# Patient Record
Sex: Male | Born: 1960 | Race: White | Hispanic: No | Marital: Single | Smoking: Former smoker
Health system: Southern US, Community
[De-identification: ages and names within clinical notes are randomized; demographics above are authoritative.]

## PROBLEM LIST (undated history)

## (undated) DIAGNOSIS — F250 Schizoaffective disorder, bipolar type: Secondary | ICD-10-CM

## (undated) DIAGNOSIS — J439 Emphysema, unspecified: Secondary | ICD-10-CM

## (undated) DIAGNOSIS — F1910 Other psychoactive substance abuse, uncomplicated: Secondary | ICD-10-CM

## (undated) DIAGNOSIS — F419 Anxiety disorder, unspecified: Secondary | ICD-10-CM

## (undated) DIAGNOSIS — M1990 Unspecified osteoarthritis, unspecified site: Secondary | ICD-10-CM

## (undated) DIAGNOSIS — E785 Hyperlipidemia, unspecified: Secondary | ICD-10-CM

## (undated) DIAGNOSIS — I10 Essential (primary) hypertension: Secondary | ICD-10-CM

## (undated) HISTORY — DX: Emphysema, unspecified: J43.9

## (undated) HISTORY — DX: Unspecified osteoarthritis, unspecified site: M19.90

## (undated) HISTORY — DX: Other psychoactive substance abuse, uncomplicated: F19.10

## (undated) HISTORY — PX: FINGER SURGERY: SHX640

## (undated) HISTORY — DX: Anxiety disorder, unspecified: F41.9

## (undated) MED FILL — Fosaprepitant Dimeglumine For IV Infusion 150 MG (Base Eq): INTRAVENOUS | Qty: 5 | Status: AC

---

## 2006-04-24 ENCOUNTER — Emergency Department: Payer: Self-pay | Admitting: Emergency Medicine

## 2008-04-20 ENCOUNTER — Emergency Department: Payer: Self-pay | Admitting: Emergency Medicine

## 2008-12-12 ENCOUNTER — Ambulatory Visit: Payer: Self-pay | Admitting: Family Medicine

## 2009-11-06 ENCOUNTER — Ambulatory Visit: Payer: Self-pay | Admitting: Unknown Physician Specialty

## 2010-09-19 ENCOUNTER — Ambulatory Visit: Payer: Self-pay | Admitting: Internal Medicine

## 2010-09-26 ENCOUNTER — Ambulatory Visit: Payer: Self-pay | Admitting: Internal Medicine

## 2012-03-05 IMAGING — CT CT HEAD WITHOUT CONTRAST
1 of 2 series · 13 of 30 positions shown, 17 images · non-contrast
Comparison: none

REASON FOR EXAM: mental status deterioration
COMMENTS:

[Series 5: soft tissue recon · axial · 0.42mm/px · z∈[-121,-2]mm · 13 of 30 slices shown, 17 images]
[im 3/30  brain]
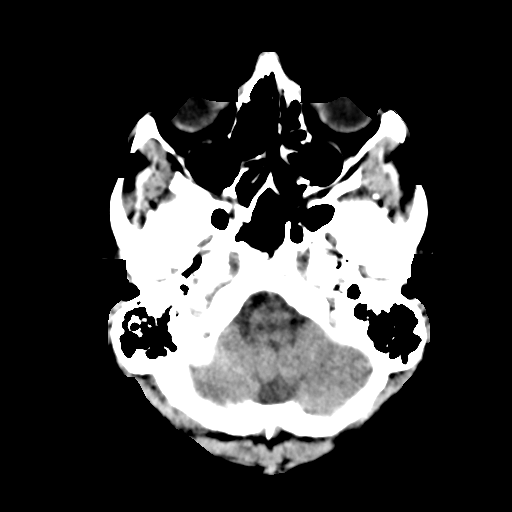
[im 3/30  bone]
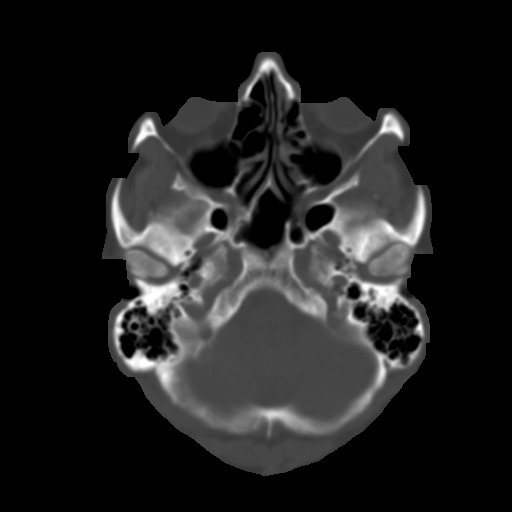
[im 5/30  brain]
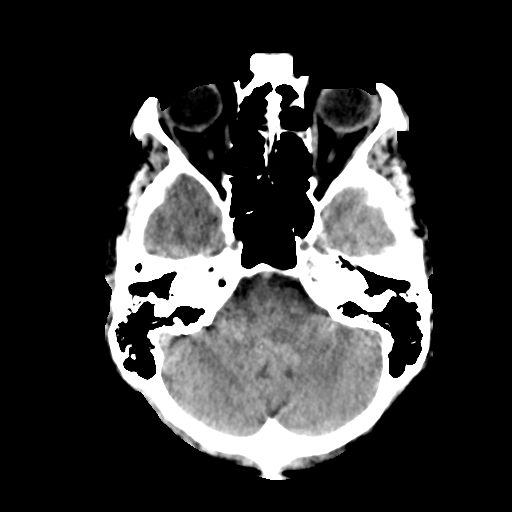
[im 7/30  brain]
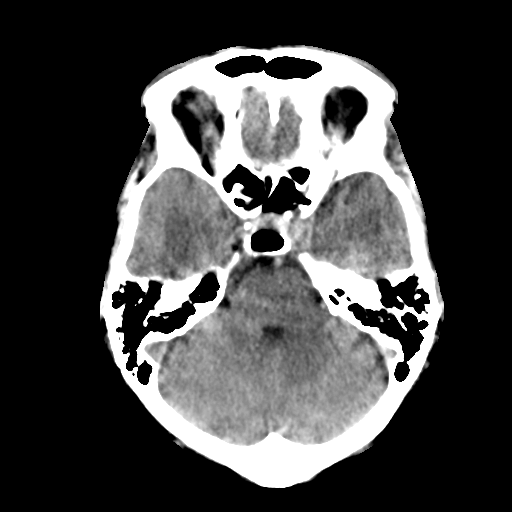
[im 9/30  brain]
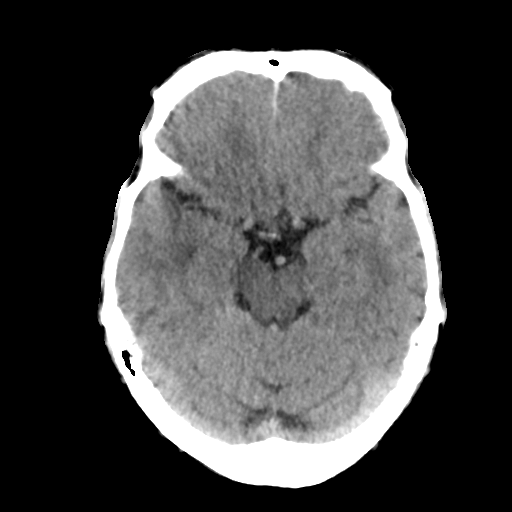
[im 11/30  brain]
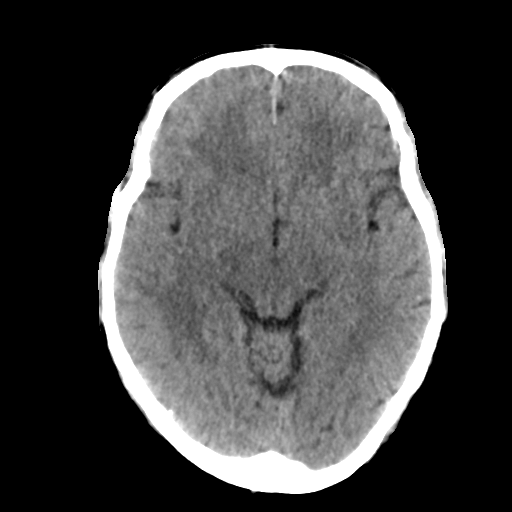
[im 11/30  bone]
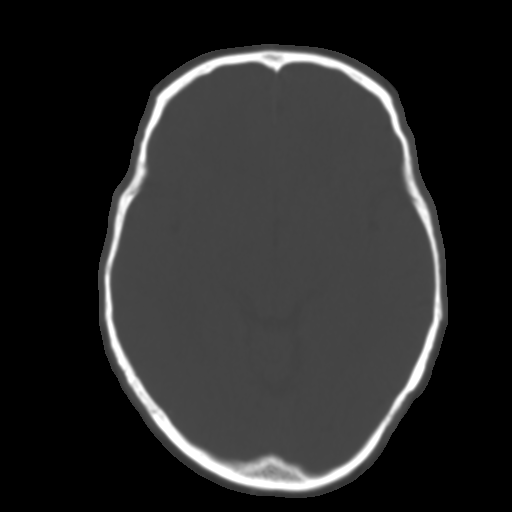
[im 13/30  brain]
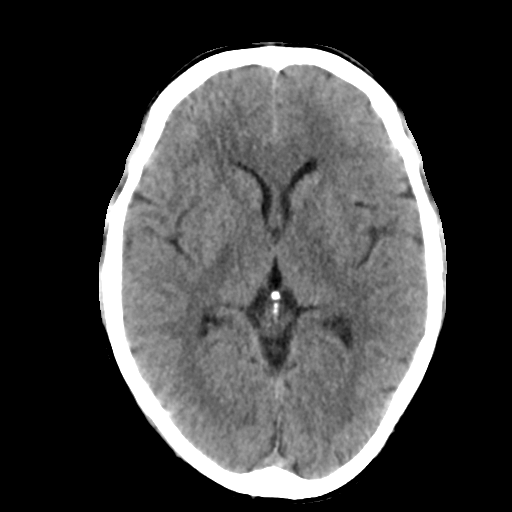
[im 15/30  brain]
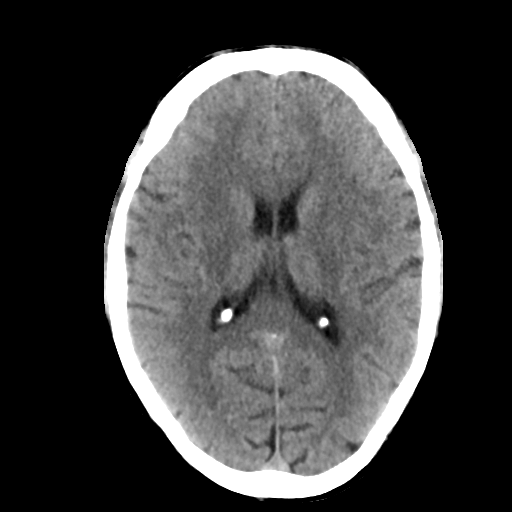
[im 17/30  brain]
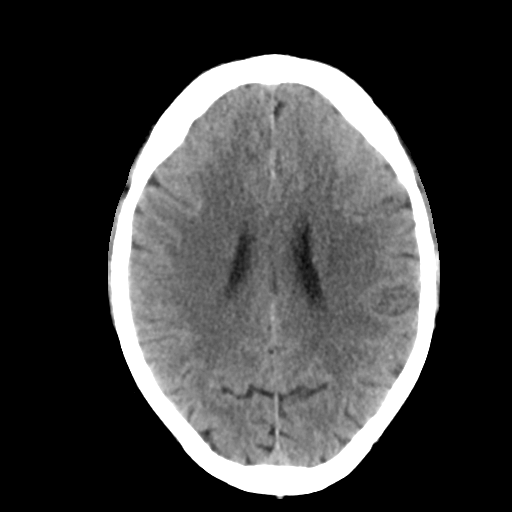
[im 19/30  brain]
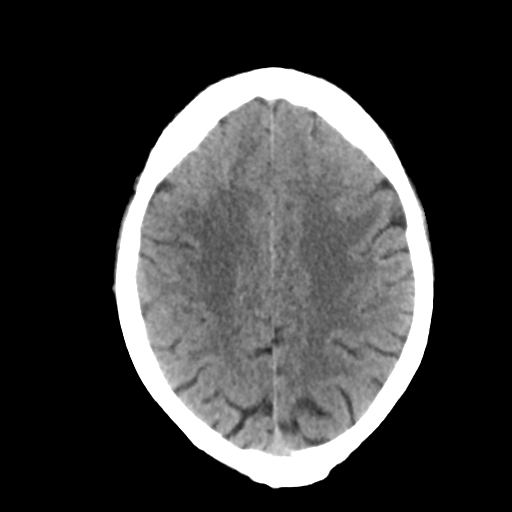
[im 19/30  bone]
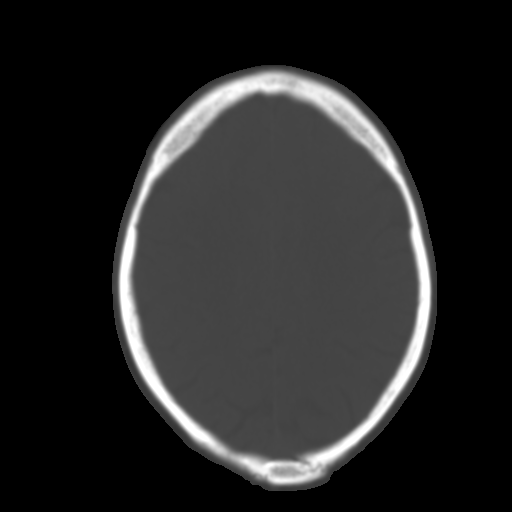
[im 21/30  brain]
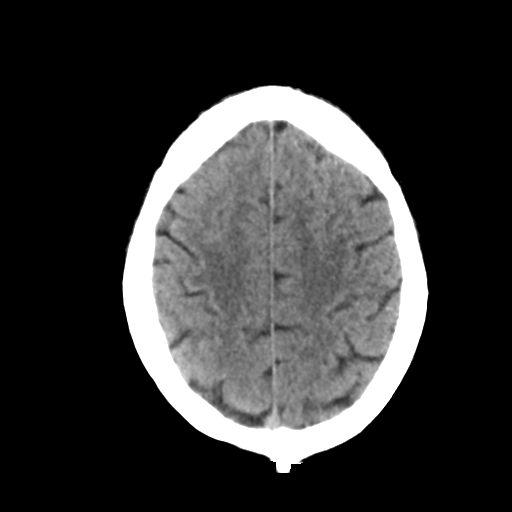
[im 23/30  brain]
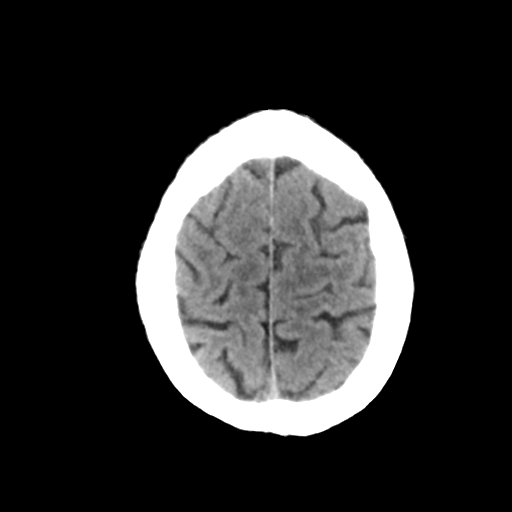
[im 25/30  brain]
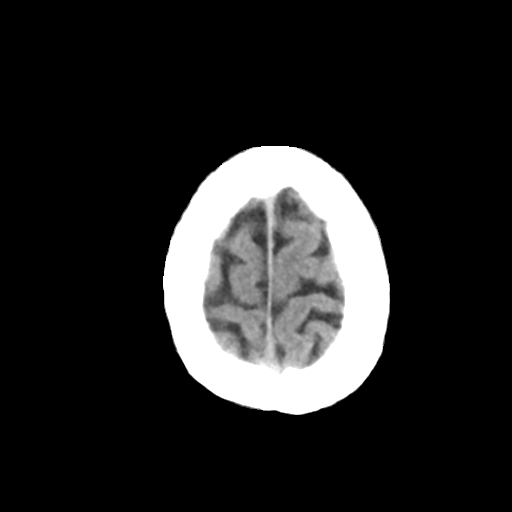
[im 27/30  brain]
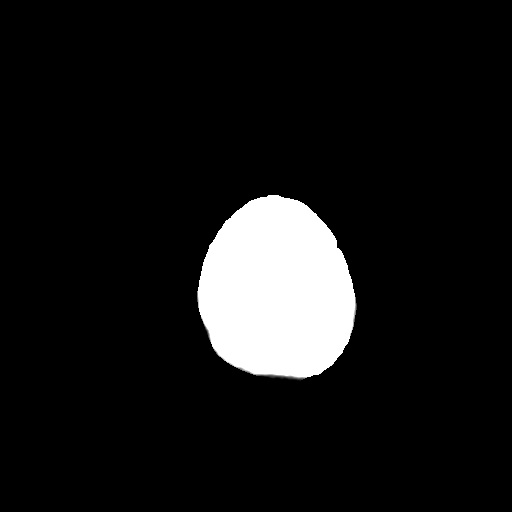
[im 27/30  bone]
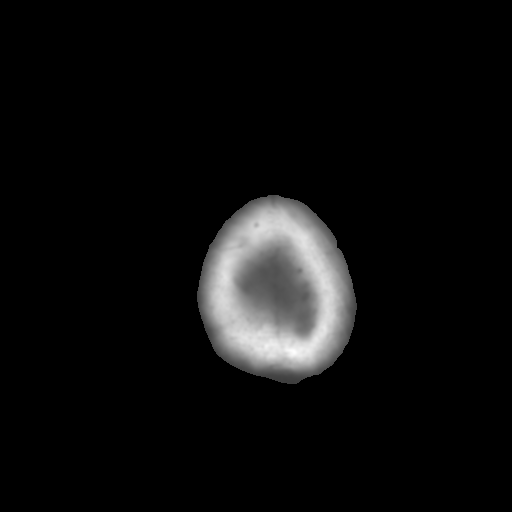

[13 of 30 positions shown; findings below may reference images not displayed]

PROCEDURE:     KCT - KCT HEAD WITHOUT CONTRAST  - September 26, 2010 [DATE]

RESULT:     Noncontrast CT of the brain is performed in the standard
fashion. The patient has no previous exam for comparison.

The ventricles and sulci appear to be within normal limits for the patient's
age. There is no evidence of intracranial hemorrhage, mass effect or midline
shift. No territorial infarct is present. The included paranasal sinuses and
mastoids demonstrate mild mucosal thickening in the ethmoid regions. The
calvarium is intact.
IMPRESSION: No focal or acute intracranial abnormality evident.

## 2014-10-03 DIAGNOSIS — Z125 Encounter for screening for malignant neoplasm of prostate: Secondary | ICD-10-CM | POA: Diagnosis not present

## 2014-10-03 DIAGNOSIS — Z79899 Other long term (current) drug therapy: Secondary | ICD-10-CM | POA: Diagnosis not present

## 2014-10-10 DIAGNOSIS — F205 Residual schizophrenia: Secondary | ICD-10-CM | POA: Diagnosis not present

## 2014-10-10 DIAGNOSIS — I10 Essential (primary) hypertension: Secondary | ICD-10-CM | POA: Diagnosis not present

## 2014-10-10 DIAGNOSIS — E780 Pure hypercholesterolemia: Secondary | ICD-10-CM | POA: Diagnosis not present

## 2014-10-10 DIAGNOSIS — Z720 Tobacco use: Secondary | ICD-10-CM | POA: Diagnosis not present

## 2014-10-17 ENCOUNTER — Emergency Department: Payer: Self-pay | Admitting: Emergency Medicine

## 2014-10-17 DIAGNOSIS — Z791 Long term (current) use of non-steroidal anti-inflammatories (NSAID): Secondary | ICD-10-CM | POA: Diagnosis not present

## 2014-10-17 DIAGNOSIS — Z720 Tobacco use: Secondary | ICD-10-CM | POA: Diagnosis not present

## 2014-10-17 DIAGNOSIS — R079 Chest pain, unspecified: Secondary | ICD-10-CM | POA: Diagnosis not present

## 2014-10-17 DIAGNOSIS — Z79899 Other long term (current) drug therapy: Secondary | ICD-10-CM | POA: Diagnosis not present

## 2014-10-17 DIAGNOSIS — S299XXA Unspecified injury of thorax, initial encounter: Secondary | ICD-10-CM | POA: Diagnosis not present

## 2014-10-17 DIAGNOSIS — S2020XA Contusion of thorax, unspecified, initial encounter: Secondary | ICD-10-CM | POA: Diagnosis not present

## 2014-10-17 DIAGNOSIS — S20219A Contusion of unspecified front wall of thorax, initial encounter: Secondary | ICD-10-CM | POA: Diagnosis not present

## 2014-12-01 DIAGNOSIS — F251 Schizoaffective disorder, depressive type: Secondary | ICD-10-CM | POA: Diagnosis not present

## 2014-12-01 DIAGNOSIS — F209 Schizophrenia, unspecified: Secondary | ICD-10-CM | POA: Diagnosis not present

## 2014-12-19 DIAGNOSIS — F251 Schizoaffective disorder, depressive type: Secondary | ICD-10-CM | POA: Diagnosis not present

## 2014-12-19 DIAGNOSIS — F209 Schizophrenia, unspecified: Secondary | ICD-10-CM | POA: Diagnosis not present

## 2015-02-01 DIAGNOSIS — F205 Residual schizophrenia: Secondary | ICD-10-CM | POA: Diagnosis not present

## 2015-02-01 DIAGNOSIS — E780 Pure hypercholesterolemia: Secondary | ICD-10-CM | POA: Diagnosis not present

## 2015-02-01 DIAGNOSIS — I10 Essential (primary) hypertension: Secondary | ICD-10-CM | POA: Diagnosis not present

## 2015-02-01 DIAGNOSIS — Z720 Tobacco use: Secondary | ICD-10-CM | POA: Diagnosis not present

## 2015-02-08 DIAGNOSIS — I10 Essential (primary) hypertension: Secondary | ICD-10-CM | POA: Diagnosis not present

## 2015-02-08 DIAGNOSIS — Z720 Tobacco use: Secondary | ICD-10-CM | POA: Diagnosis not present

## 2015-02-08 DIAGNOSIS — F205 Residual schizophrenia: Secondary | ICD-10-CM | POA: Diagnosis not present

## 2015-02-08 DIAGNOSIS — E780 Pure hypercholesterolemia: Secondary | ICD-10-CM | POA: Diagnosis not present

## 2015-03-13 DIAGNOSIS — F209 Schizophrenia, unspecified: Secondary | ICD-10-CM | POA: Diagnosis not present

## 2015-03-13 DIAGNOSIS — F251 Schizoaffective disorder, depressive type: Secondary | ICD-10-CM | POA: Diagnosis not present

## 2015-06-12 DIAGNOSIS — F251 Schizoaffective disorder, depressive type: Secondary | ICD-10-CM | POA: Diagnosis not present

## 2015-06-12 DIAGNOSIS — F209 Schizophrenia, unspecified: Secondary | ICD-10-CM | POA: Diagnosis not present

## 2015-06-30 DIAGNOSIS — Z720 Tobacco use: Secondary | ICD-10-CM | POA: Diagnosis not present

## 2015-06-30 DIAGNOSIS — E7800 Pure hypercholesterolemia, unspecified: Secondary | ICD-10-CM | POA: Diagnosis not present

## 2015-06-30 DIAGNOSIS — F205 Residual schizophrenia: Secondary | ICD-10-CM | POA: Diagnosis not present

## 2015-06-30 DIAGNOSIS — R05 Cough: Secondary | ICD-10-CM | POA: Diagnosis not present

## 2015-06-30 DIAGNOSIS — I10 Essential (primary) hypertension: Secondary | ICD-10-CM | POA: Diagnosis not present

## 2015-07-07 DIAGNOSIS — E871 Hypo-osmolality and hyponatremia: Secondary | ICD-10-CM | POA: Diagnosis not present

## 2015-07-07 DIAGNOSIS — F2089 Other schizophrenia: Secondary | ICD-10-CM | POA: Diagnosis not present

## 2015-07-07 DIAGNOSIS — E7800 Pure hypercholesterolemia, unspecified: Secondary | ICD-10-CM | POA: Diagnosis not present

## 2015-07-07 DIAGNOSIS — I10 Essential (primary) hypertension: Secondary | ICD-10-CM | POA: Diagnosis not present

## 2015-07-07 DIAGNOSIS — Z23 Encounter for immunization: Secondary | ICD-10-CM | POA: Diagnosis not present

## 2015-07-07 DIAGNOSIS — Z720 Tobacco use: Secondary | ICD-10-CM | POA: Diagnosis not present

## 2015-09-07 DIAGNOSIS — F209 Schizophrenia, unspecified: Secondary | ICD-10-CM | POA: Diagnosis not present

## 2015-09-07 DIAGNOSIS — F251 Schizoaffective disorder, depressive type: Secondary | ICD-10-CM | POA: Diagnosis not present

## 2015-10-31 DIAGNOSIS — I10 Essential (primary) hypertension: Secondary | ICD-10-CM | POA: Diagnosis not present

## 2015-10-31 DIAGNOSIS — F2089 Other schizophrenia: Secondary | ICD-10-CM | POA: Diagnosis not present

## 2015-10-31 DIAGNOSIS — E7800 Pure hypercholesterolemia, unspecified: Secondary | ICD-10-CM | POA: Diagnosis not present

## 2015-10-31 DIAGNOSIS — E871 Hypo-osmolality and hyponatremia: Secondary | ICD-10-CM | POA: Diagnosis not present

## 2015-10-31 DIAGNOSIS — Z720 Tobacco use: Secondary | ICD-10-CM | POA: Diagnosis not present

## 2015-10-31 DIAGNOSIS — Z23 Encounter for immunization: Secondary | ICD-10-CM | POA: Diagnosis not present

## 2015-11-07 DIAGNOSIS — Z23 Encounter for immunization: Secondary | ICD-10-CM | POA: Diagnosis not present

## 2015-11-07 DIAGNOSIS — E871 Hypo-osmolality and hyponatremia: Secondary | ICD-10-CM | POA: Diagnosis not present

## 2015-11-07 DIAGNOSIS — E7800 Pure hypercholesterolemia, unspecified: Secondary | ICD-10-CM | POA: Diagnosis not present

## 2015-11-07 DIAGNOSIS — I10 Essential (primary) hypertension: Secondary | ICD-10-CM | POA: Diagnosis not present

## 2015-11-07 DIAGNOSIS — F2089 Other schizophrenia: Secondary | ICD-10-CM | POA: Diagnosis not present

## 2015-11-07 DIAGNOSIS — Z720 Tobacco use: Secondary | ICD-10-CM | POA: Diagnosis not present

## 2015-11-30 DIAGNOSIS — F209 Schizophrenia, unspecified: Secondary | ICD-10-CM | POA: Diagnosis not present

## 2015-11-30 DIAGNOSIS — F251 Schizoaffective disorder, depressive type: Secondary | ICD-10-CM | POA: Diagnosis not present

## 2016-02-20 DIAGNOSIS — F251 Schizoaffective disorder, depressive type: Secondary | ICD-10-CM | POA: Diagnosis not present

## 2016-02-20 DIAGNOSIS — F209 Schizophrenia, unspecified: Secondary | ICD-10-CM | POA: Diagnosis not present

## 2016-02-29 DIAGNOSIS — E7800 Pure hypercholesterolemia, unspecified: Secondary | ICD-10-CM | POA: Diagnosis not present

## 2016-02-29 DIAGNOSIS — F2089 Other schizophrenia: Secondary | ICD-10-CM | POA: Diagnosis not present

## 2016-02-29 DIAGNOSIS — E871 Hypo-osmolality and hyponatremia: Secondary | ICD-10-CM | POA: Diagnosis not present

## 2016-02-29 DIAGNOSIS — Z720 Tobacco use: Secondary | ICD-10-CM | POA: Diagnosis not present

## 2016-02-29 DIAGNOSIS — I10 Essential (primary) hypertension: Secondary | ICD-10-CM | POA: Diagnosis not present

## 2016-03-07 DIAGNOSIS — Z0000 Encounter for general adult medical examination without abnormal findings: Secondary | ICD-10-CM | POA: Diagnosis not present

## 2016-03-07 DIAGNOSIS — Z720 Tobacco use: Secondary | ICD-10-CM | POA: Diagnosis not present

## 2016-03-07 DIAGNOSIS — E871 Hypo-osmolality and hyponatremia: Secondary | ICD-10-CM | POA: Diagnosis not present

## 2016-03-07 DIAGNOSIS — F2089 Other schizophrenia: Secondary | ICD-10-CM | POA: Diagnosis not present

## 2016-03-07 DIAGNOSIS — E7800 Pure hypercholesterolemia, unspecified: Secondary | ICD-10-CM | POA: Diagnosis not present

## 2016-03-07 DIAGNOSIS — I10 Essential (primary) hypertension: Secondary | ICD-10-CM | POA: Diagnosis not present

## 2016-03-26 IMAGING — CR DG RIBS 2V*R*
1 series · 4 of 4 positions shown · non-contrast
Comparison: 10/17/2014

CLINICAL DATA: 53-year-old male with a history of right-sided chest
pain posterior after a slip and a fall.

EXAM:
RIGHT RIBS - 2 VIEW

[Series 1: dxr ribs right unilateral · 0.14mm/px · 4 of 4 slices shown]
[im 1/4]
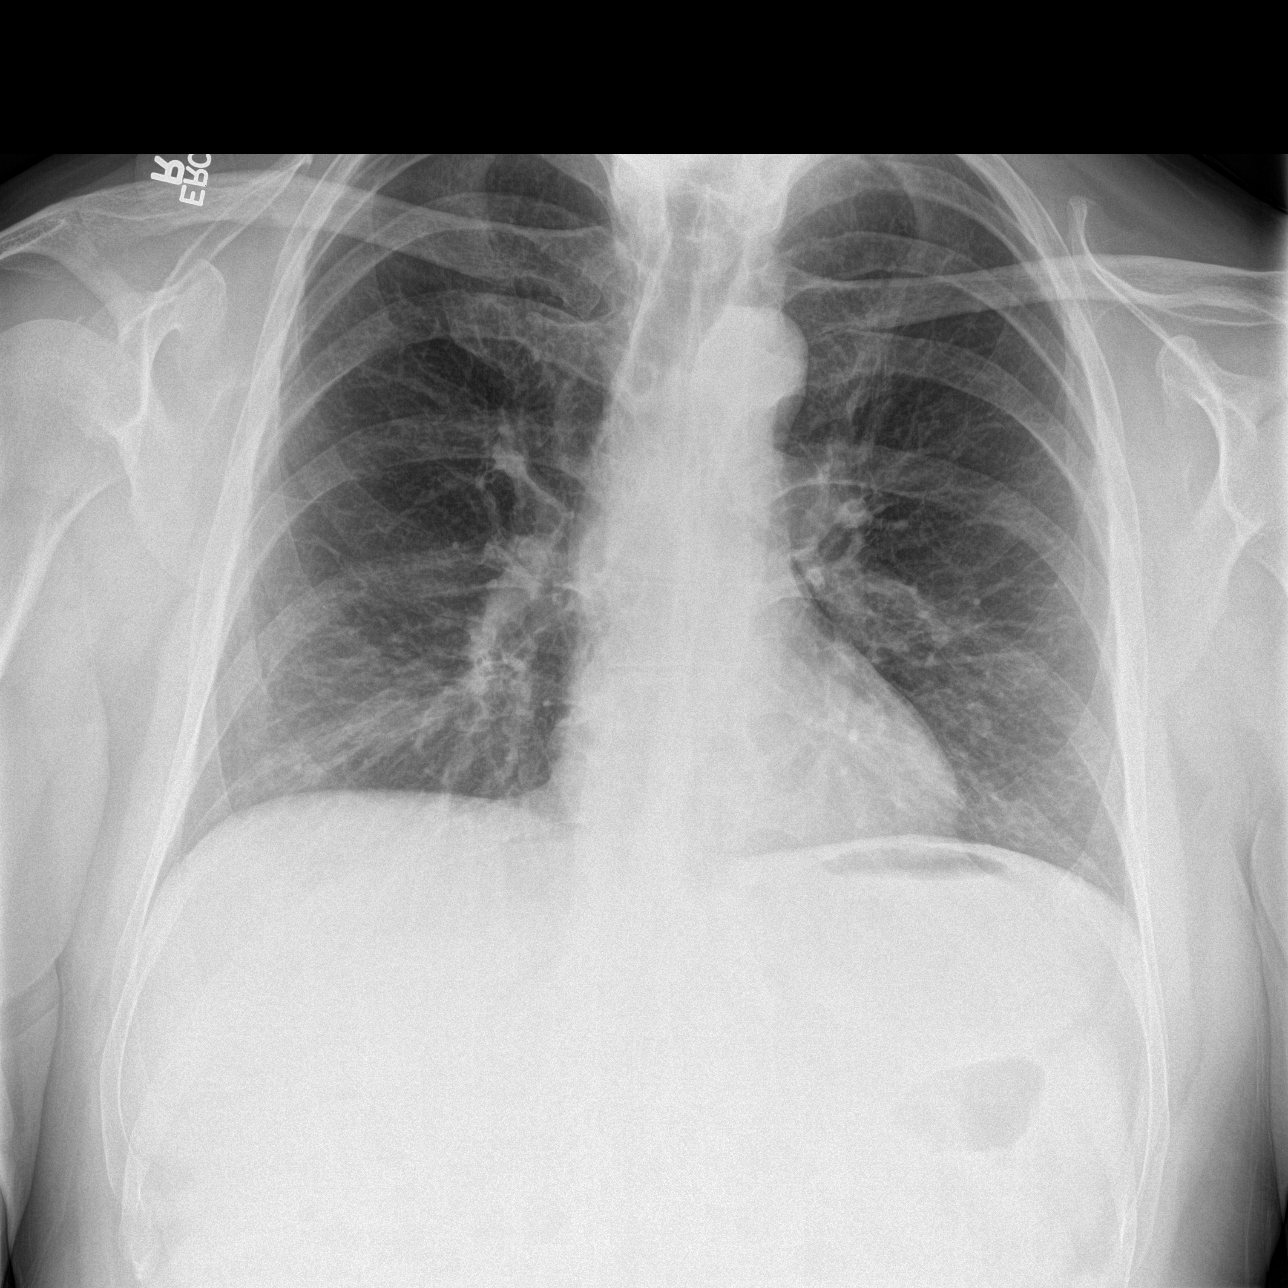
[im 2/4]
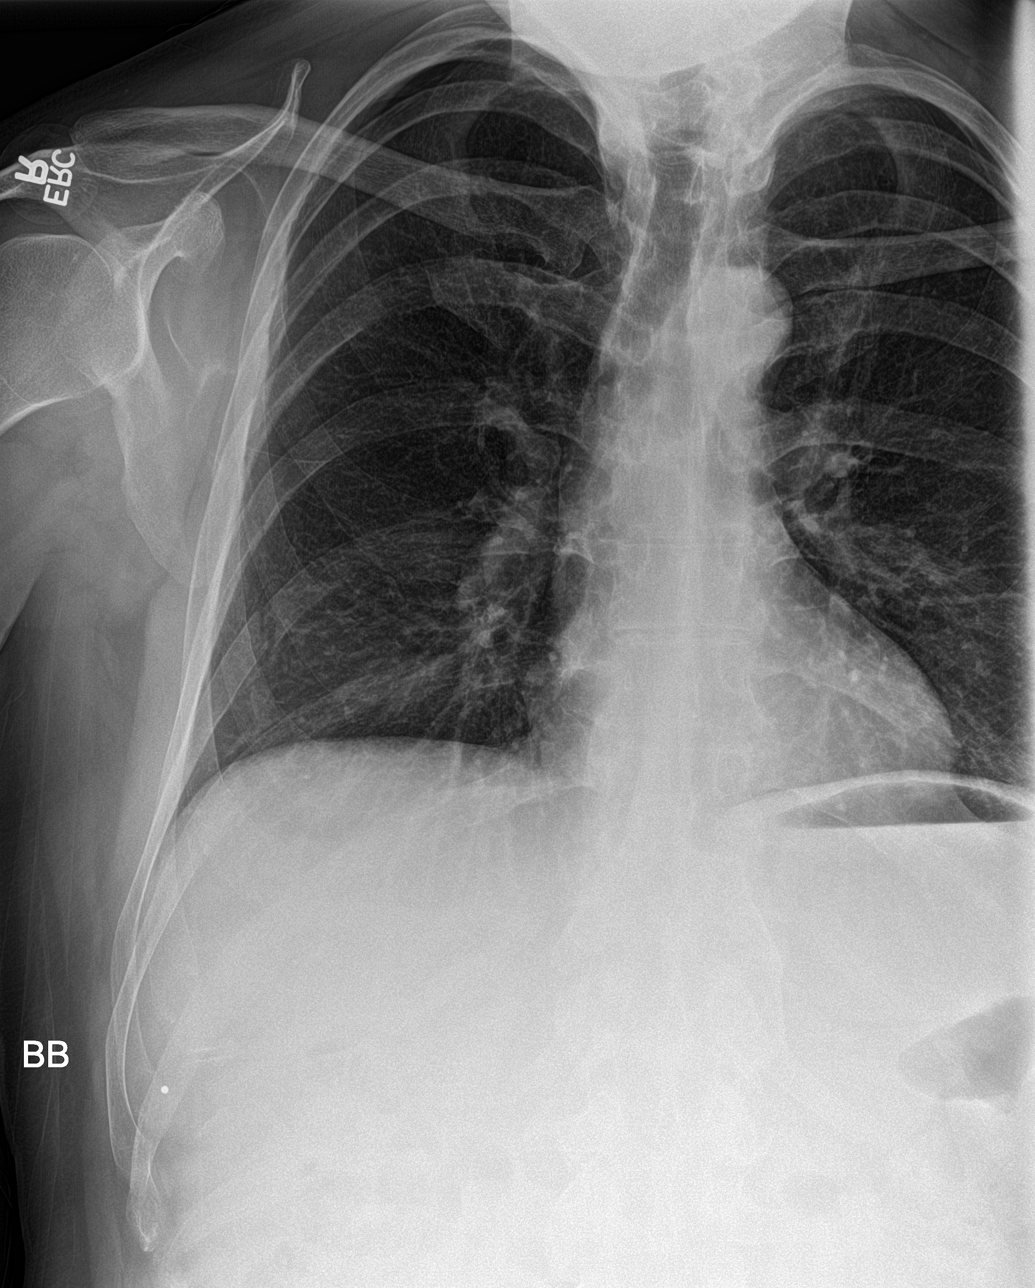
[im 3/4]
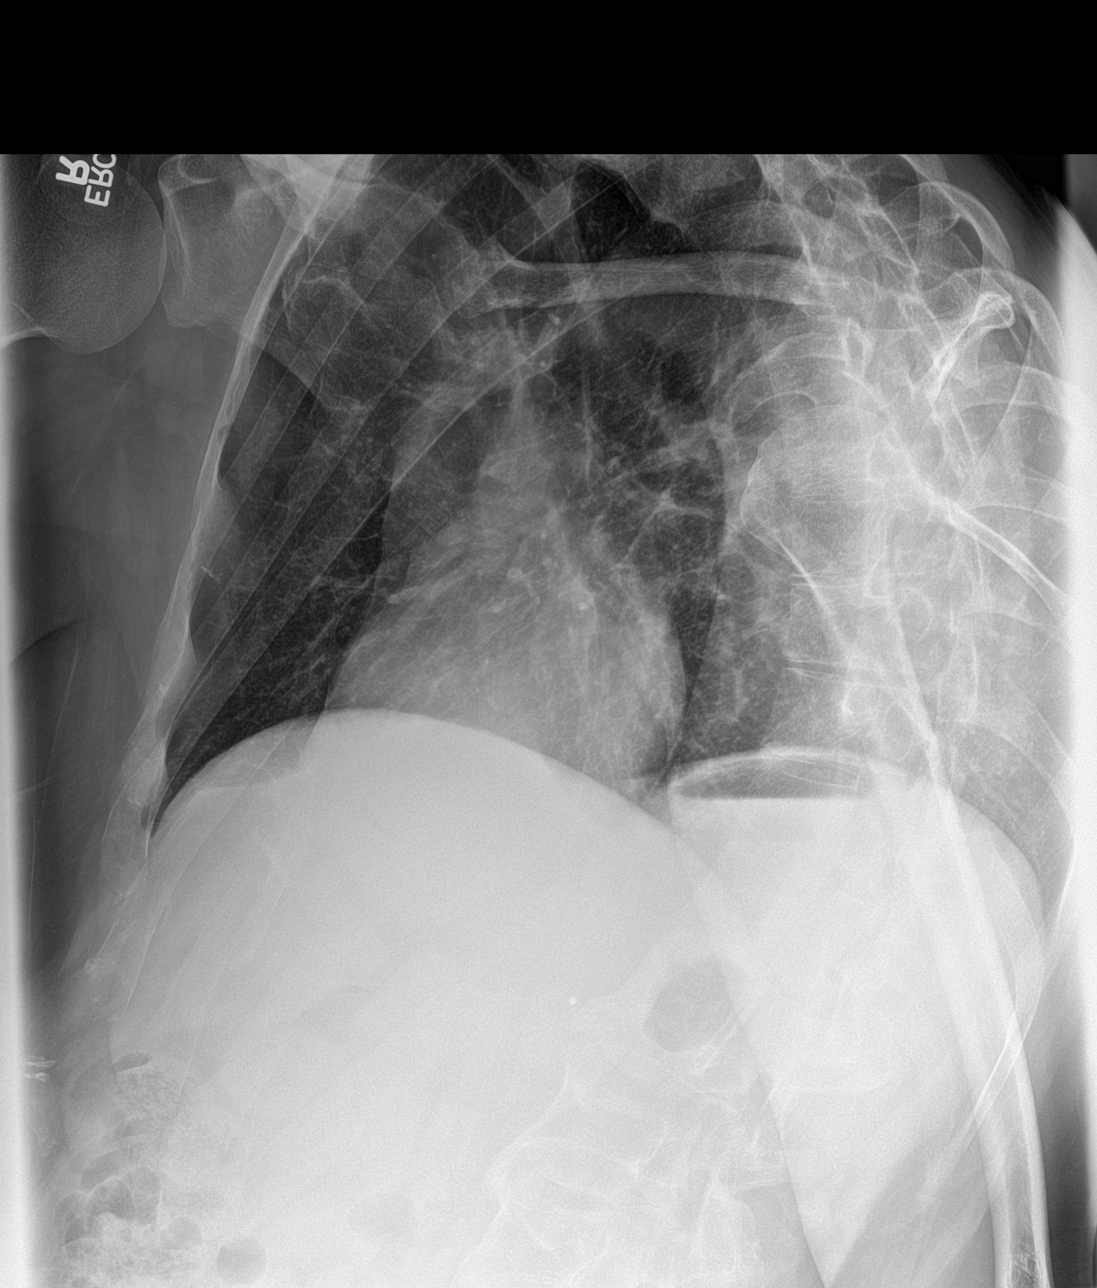
[im 4/4]
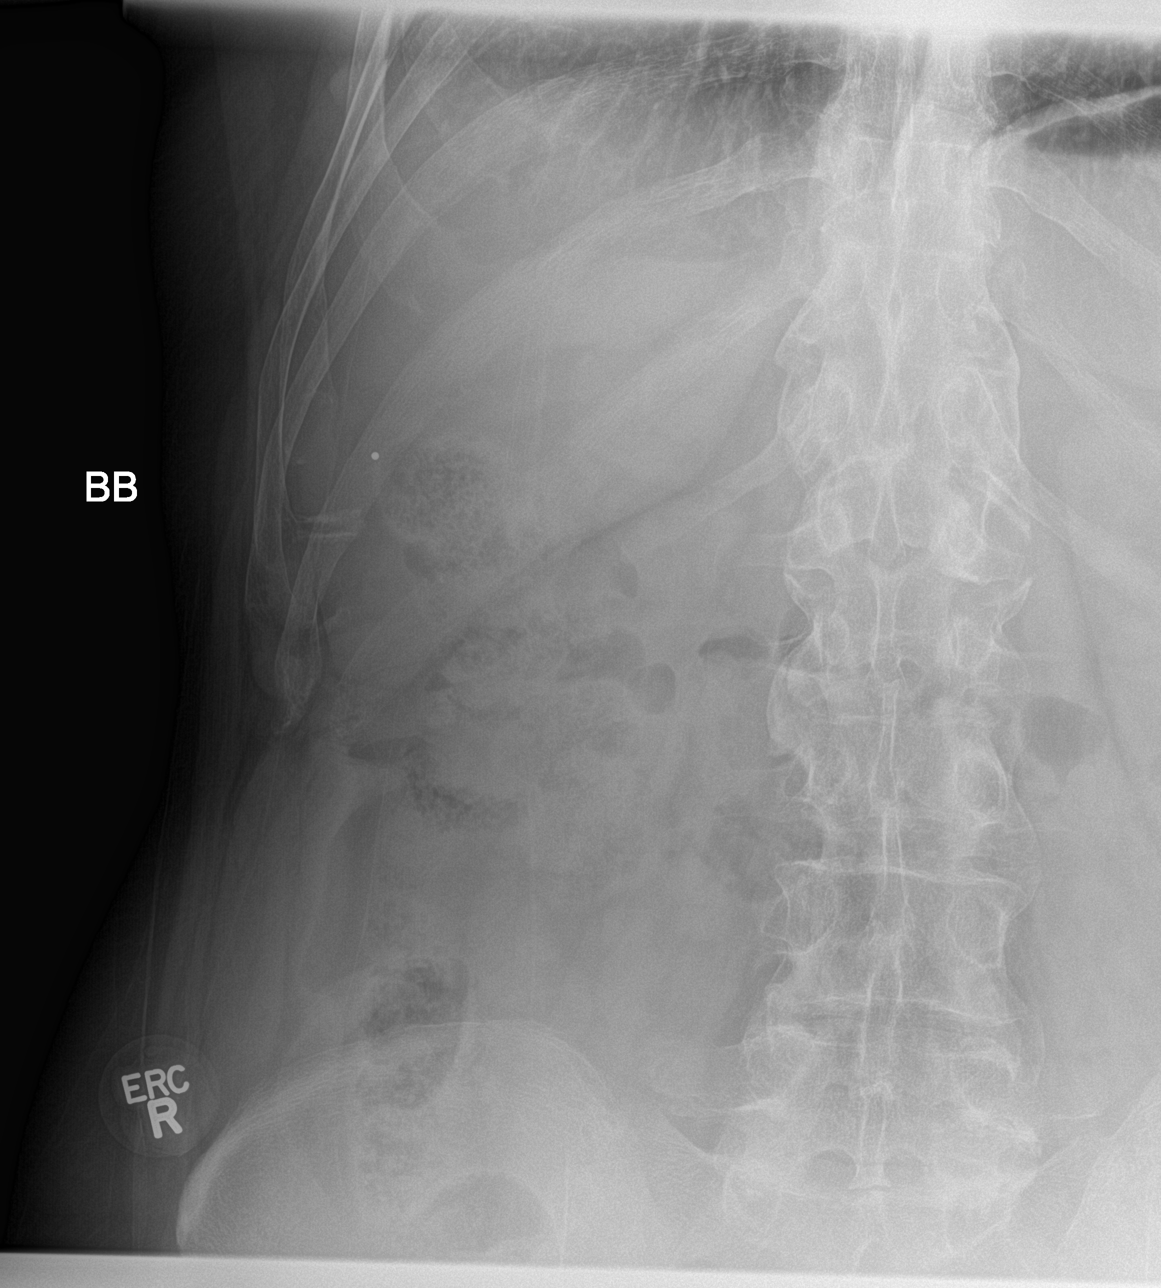

[4 of 4 positions shown; findings below may reference images not displayed]

FINDINGS: Cardiomediastinal silhouette unchanged in size and contour.
Calcifications of the aortic arch. No evidence of pulmonary vascular
congestion.

Linear opacity at the right base, more prominent than the prior,
likely scarring or atelectasis.

No confluent airspace disease.

No pleural effusion or pneumothorax.

Fiducial marker overlying the posterior right tenth rib. No
displaced fracture identified.
IMPRESSION: No radiographic evidence of acute cardiopulmonary disease.

No displaced rib fracture identified.

## 2016-04-03 ENCOUNTER — Encounter: Payer: Self-pay | Admitting: Occupational Medicine

## 2016-04-03 ENCOUNTER — Emergency Department
Admission: EM | Admit: 2016-04-03 | Discharge: 2016-04-03 | Disposition: A | Discharge status: Home / Self Care | Payer: Medicare Other | Attending: Emergency Medicine | Admitting: Emergency Medicine

## 2016-04-03 DIAGNOSIS — E785 Hyperlipidemia, unspecified: Secondary | ICD-10-CM | POA: Diagnosis not present

## 2016-04-03 DIAGNOSIS — F17200 Nicotine dependence, unspecified, uncomplicated: Secondary | ICD-10-CM | POA: Diagnosis not present

## 2016-04-03 DIAGNOSIS — W228XXA Striking against or struck by other objects, initial encounter: Secondary | ICD-10-CM | POA: Insufficient documentation

## 2016-04-03 DIAGNOSIS — Y939 Activity, unspecified: Secondary | ICD-10-CM | POA: Diagnosis not present

## 2016-04-03 DIAGNOSIS — I10 Essential (primary) hypertension: Secondary | ICD-10-CM | POA: Diagnosis not present

## 2016-04-03 DIAGNOSIS — S0990XA Unspecified injury of head, initial encounter: Secondary | ICD-10-CM | POA: Diagnosis present

## 2016-04-03 DIAGNOSIS — Y999 Unspecified external cause status: Secondary | ICD-10-CM | POA: Diagnosis not present

## 2016-04-03 DIAGNOSIS — Y929 Unspecified place or not applicable: Secondary | ICD-10-CM | POA: Insufficient documentation

## 2016-04-03 DIAGNOSIS — S0101XA Laceration without foreign body of scalp, initial encounter: Secondary | ICD-10-CM | POA: Insufficient documentation

## 2016-04-03 DIAGNOSIS — F250 Schizoaffective disorder, bipolar type: Secondary | ICD-10-CM | POA: Insufficient documentation

## 2016-04-03 HISTORY — DX: Essential (primary) hypertension: I10

## 2016-04-03 HISTORY — DX: Hyperlipidemia, unspecified: E78.5

## 2016-04-03 HISTORY — DX: Schizoaffective disorder, bipolar type: F25.0

## 2016-04-03 NOTE — ED Notes (Signed)
Pt presents to ER with head laceration from hitting a nail on a shelf. Pt states it was pouring blood. On arrival no bleeding noted. Pt has a scratch to the top of head. Denies LOC. Pt has a knot on the back of head that been there a while and has been evaluated by Dr Ginette Pitman.

## 2016-04-03 NOTE — Discharge Instructions (Signed)
Laceration Care, Adult  A laceration is a cut that goes through all layers of the skin. The cut also goes into the tissue that is right under the skin. Some cuts heal on their own. Others need to be closed with stitches (sutures), staples, skin adhesive strips, or wound glue. Taking care of your cut lowers your risk of infection and helps your cut to heal better.  HOW TO TAKE CARE OF YOUR CUT  For stitches or staples:  · Keep the wound clean and dry.  · If you were given a bandage (dressing), you should change it at least one time per day or as told by your doctor. You should also change it if it gets wet or dirty.  · Keep the wound completely dry for the first 24 hours or as told by your doctor. After that time, you may take a shower or a bath. However, make sure that the wound is not soaked in water until after the stitches or staples have been removed.  · Clean the wound one time each day or as told by your doctor:    Wash the wound with soap and water.    Rinse the wound with water until all of the soap comes off.    Pat the wound dry with a clean towel. Do not rub the wound.  · After you clean the wound, put a thin layer of antibiotic ointment on it as told by your doctor. This ointment:    Helps to prevent infection.    Keeps the bandage from sticking to the wound.  · Have your stitches or staples removed as told by your doctor.  If your doctor used skin adhesive strips:   · Keep the wound clean and dry.  · If you were given a bandage, you should change it at least one time per day or as told by your doctor. You should also change it if it gets dirty or wet.  · Do not get the skin adhesive strips wet. You can take a shower or a bath, but be careful to keep the wound dry.  · If the wound gets wet, pat it dry with a clean towel. Do not rub the wound.  · Skin adhesive strips fall off on their own. You can trim the strips as the wound heals. Do not remove any strips that are still stuck to the wound. They will  fall off after a while.  If your doctor used wound glue:  · Try to keep your wound dry, but you may briefly wet it in the shower or bath. Do not soak the wound in water, such as by swimming.  · After you take a shower or a bath, gently pat the wound dry with a clean towel. Do not rub the wound.  · Do not do any activities that will make you really sweaty until the skin glue has fallen off on its own.  · Do not apply liquid, cream, or ointment medicine to your wound while the skin glue is still on.  · If you were given a bandage, you should change it at least one time per day or as told by your doctor. You should also change it if it gets dirty or wet.  · If a bandage is placed over the wound, do not let the tape for the bandage touch the skin glue.  · Do not pick at the glue. The skin glue usually stays on for 5-10 days. Then, it   falls off of the skin.  General Instructions   · To help prevent scarring, make sure to cover your wound with sunscreen whenever you are outside after stitches are removed, after adhesive strips are removed, or when wound glue stays in place and the wound is healed. Make sure to wear a sunscreen of at least 30 SPF.  · Take over-the-counter and prescription medicines only as told by your doctor.  · If you were given antibiotic medicine or ointment, take or apply it as told by your doctor. Do not stop using the antibiotic even if your wound is getting better.  · Do not scratch or pick at the wound.  · Keep all follow-up visits as told by your doctor. This is important.  · Check your wound every day for signs of infection. Watch for:    Redness, swelling, or pain.    Fluid, blood, or pus.  · Raise (elevate) the injured area above the level of your heart while you are sitting or lying down, if possible.  GET HELP IF:  · You got a tetanus shot and you have any of these problems at the injection site:    Swelling.    Very bad pain.    Redness.    Bleeding.  · You have a fever.  · A wound that was  closed breaks open.  · You notice a bad smell coming from your wound or your bandage.  · You notice something coming out of the wound, such as wood or glass.  · Medicine does not help your pain.  · You have more redness, swelling, or pain at the site of your wound.  · You have fluid, blood, or pus coming from your wound.  · You notice a change in the color of your skin near your wound.  · You need to change the bandage often because fluid, blood, or pus is coming from the wound.  · You start to have a new rash.  · You start to have numbness around the wound.  GET HELP RIGHT AWAY IF:  · You have very bad swelling around the wound.  · Your pain suddenly gets worse and is very bad.  · You notice painful lumps near the wound or on skin that is anywhere on your body.  · You have a red streak going away from your wound.  · The wound is on your hand or foot and you cannot move a finger or toe like you usually can.  · The wound is on your hand or foot and you notice that your fingers or toes look pale or bluish.     This information is not intended to replace advice given to you by your health care provider. Make sure you discuss any questions you have with your health care provider.     Document Released: 02/26/2008 Document Revised: 01/24/2015 Document Reviewed: 09/05/2014  Elsevier Interactive Patient Education ©2016 [employer]

## 2016-04-03 NOTE — ED Provider Notes (Signed)
[facility] Emergency Department Provider Note  ____________________________________________  Time seen: Approximately 7:50 PM  I have reviewed the triage vital signs and the nursing notes.   HISTORY  Chief Complaint Head Laceration   HPI Trevor Montgomery is a 55 y.o. male who presents to the emergency department for evaluation of a superficial laceration to the top of his head. He hit his head on a shelf that had a nail sticking out of it. He states that it pulled the blood initially, but it has since stopped. Tetanus shot is up-to-date.  Past Medical History  Diagnosis Date  . HTN (hypertension)   . Schizoaffective disorder, bipolar type ([place])   . Hyperlipemia     There are no active problems to display for this patient.   Past Surgical History  Procedure Laterality Date  . Finger surgery      No current outpatient prescriptions on file.  Allergies Review of patient's allergies indicates no known allergies.  History reviewed. No pertinent family history.  Social History Social History  Substance Use Topics  . Smoking status: Current Every Day Smoker  . Smokeless tobacco: None  . Alcohol Use: No    Review of Systems  Constitutional: Negative for fever/chills Respiratory: Negative for shortness of breath. Musculoskeletal: Negative for pain. Skin: Positive for laceration Neurological: Negative for headaches, focal weakness or numbness. ____________________________________________   PHYSICAL EXAM:  VITAL SIGNS: ED Triage Vitals  Enc Vitals Group     BP 04/03/16 1936 150/99 mmHg     Pulse Rate 04/03/16 1936 86     Resp 04/03/16 1936 18     Temp 04/03/16 1936 98.6 F (37 C)     Temp Source 04/03/16 1936 Oral     SpO2 04/03/16 1936 95 %     Weight 04/03/16 1936 195 lb (88.451 kg)     Height 04/03/16 1936 '5\' 7"'$  (1.702 m)     Head Cir --      Peak Flow --      Pain Score 04/03/16 1939 7     Pain Loc --      Pain Edu? --      Excl.  in [place]? --      Constitutional: Alert and oriented. Well appearing and in no acute distress. Eyes: Conjunctivae are normal. EOMI. Nose: No congestion/rhinnorhea. Mouth/Throat: Mucous membranes are moist.   Neck: No stridor. Lymphatic: No cervical lymphadenopathy. Cardiovascular: Good peripheral circulation. Respiratory: Normal respiratory effort.  No retractions. Musculoskeletal: FROM throughout. Neurologic:  Normal speech and language. No gross focal neurologic deficits are appreciated. Skin:  1.5 cm superficial laceration to the top of the scalp. No active bleeding.  ____________________________________________   LABS (all labs ordered are listed, but only abnormal results are displayed)  Labs Reviewed - No data to display ____________________________________________  EKG   ____________________________________________  RADIOLOGY   ____________________________________________   PROCEDURES  Procedure(s) performed:   LACERATION REPAIR Performed by: Sherrie George  Consent: Verbal consent obtained.  Consent given by: patient  Prepped and Draped in normal sterile fashion  Wound explored: No foreign bodies.  Laceration Location: Parietal scalp  Laceration Length: 1.5 cm  Anesthesia: Not indicated   Anesthetic total: 0 ml  Amount of cleaning: Standard   Skin closure: Skin adhesive   Number of sutures: N/A   Technique: Topical   Patient tolerance: Patient tolerated the procedure well with no immediate complications.  ____________________________________________   INITIAL IMPRESSION / ASSESSMENT AND PLAN / ED COURSE  Pertinent labs &  imaging results that were available during my care of the patient were reviewed by me and considered in my medical decision making (see chart for details).  Patient and caretaker were given wound care instructions. Return precautions were discussed. He is to follow-up with his primary care provider for any concern or  sign of infection.  There are no discharge medications for this patient.   ____________________________________________  FINAL CLINICAL IMPRESSION(S) / ED DIAGNOSES  Final diagnoses:  Scalp laceration, initial encounter    New Prescriptions   No medications on file     Victorino Dike, FNP 04/03/16 2037  Rudene Re, MD 04/04/16 [phone]

## 2016-05-22 DIAGNOSIS — F209 Schizophrenia, unspecified: Secondary | ICD-10-CM | POA: Diagnosis not present

## 2016-05-22 DIAGNOSIS — F251 Schizoaffective disorder, depressive type: Secondary | ICD-10-CM | POA: Diagnosis not present

## 2016-07-09 DIAGNOSIS — F2089 Other schizophrenia: Secondary | ICD-10-CM | POA: Diagnosis not present

## 2016-07-09 DIAGNOSIS — E7800 Pure hypercholesterolemia, unspecified: Secondary | ICD-10-CM | POA: Diagnosis not present

## 2016-07-09 DIAGNOSIS — E871 Hypo-osmolality and hyponatremia: Secondary | ICD-10-CM | POA: Diagnosis not present

## 2016-07-09 DIAGNOSIS — Z0000 Encounter for general adult medical examination without abnormal findings: Secondary | ICD-10-CM | POA: Diagnosis not present

## 2016-07-09 DIAGNOSIS — Z720 Tobacco use: Secondary | ICD-10-CM | POA: Diagnosis not present

## 2016-07-09 DIAGNOSIS — I10 Essential (primary) hypertension: Secondary | ICD-10-CM | POA: Diagnosis not present

## 2016-07-10 DIAGNOSIS — Z720 Tobacco use: Secondary | ICD-10-CM | POA: Diagnosis not present

## 2016-07-10 DIAGNOSIS — E871 Hypo-osmolality and hyponatremia: Secondary | ICD-10-CM | POA: Diagnosis not present

## 2016-07-10 DIAGNOSIS — F2089 Other schizophrenia: Secondary | ICD-10-CM | POA: Diagnosis not present

## 2016-07-10 DIAGNOSIS — Z0000 Encounter for general adult medical examination without abnormal findings: Secondary | ICD-10-CM | POA: Diagnosis not present

## 2016-07-10 DIAGNOSIS — E7800 Pure hypercholesterolemia, unspecified: Secondary | ICD-10-CM | POA: Diagnosis not present

## 2016-07-10 DIAGNOSIS — I10 Essential (primary) hypertension: Secondary | ICD-10-CM | POA: Diagnosis not present

## 2016-07-16 DIAGNOSIS — E7800 Pure hypercholesterolemia, unspecified: Secondary | ICD-10-CM | POA: Diagnosis not present

## 2016-07-16 DIAGNOSIS — Z125 Encounter for screening for malignant neoplasm of prostate: Secondary | ICD-10-CM | POA: Diagnosis not present

## 2016-07-16 DIAGNOSIS — F2089 Other schizophrenia: Secondary | ICD-10-CM | POA: Diagnosis not present

## 2016-07-16 DIAGNOSIS — Z23 Encounter for immunization: Secondary | ICD-10-CM | POA: Diagnosis not present

## 2016-07-16 DIAGNOSIS — Z720 Tobacco use: Secondary | ICD-10-CM | POA: Diagnosis not present

## 2016-07-17 DIAGNOSIS — F209 Schizophrenia, unspecified: Secondary | ICD-10-CM | POA: Diagnosis not present

## 2016-07-17 DIAGNOSIS — F251 Schizoaffective disorder, depressive type: Secondary | ICD-10-CM | POA: Diagnosis not present

## 2016-11-04 DIAGNOSIS — Z720 Tobacco use: Secondary | ICD-10-CM | POA: Diagnosis not present

## 2016-11-04 DIAGNOSIS — Z125 Encounter for screening for malignant neoplasm of prostate: Secondary | ICD-10-CM | POA: Diagnosis not present

## 2016-11-04 DIAGNOSIS — E7800 Pure hypercholesterolemia, unspecified: Secondary | ICD-10-CM | POA: Diagnosis not present

## 2016-11-04 DIAGNOSIS — Z23 Encounter for immunization: Secondary | ICD-10-CM | POA: Diagnosis not present

## 2016-11-04 DIAGNOSIS — F2089 Other schizophrenia: Secondary | ICD-10-CM | POA: Diagnosis not present

## 2016-11-05 DIAGNOSIS — F209 Schizophrenia, unspecified: Secondary | ICD-10-CM | POA: Diagnosis not present

## 2016-11-05 DIAGNOSIS — F251 Schizoaffective disorder, depressive type: Secondary | ICD-10-CM | POA: Diagnosis not present

## 2016-11-11 DIAGNOSIS — F2089 Other schizophrenia: Secondary | ICD-10-CM | POA: Diagnosis not present

## 2016-11-11 DIAGNOSIS — E7800 Pure hypercholesterolemia, unspecified: Secondary | ICD-10-CM | POA: Diagnosis not present

## 2016-11-11 DIAGNOSIS — I10 Essential (primary) hypertension: Secondary | ICD-10-CM | POA: Diagnosis not present

## 2016-11-11 DIAGNOSIS — Z720 Tobacco use: Secondary | ICD-10-CM | POA: Diagnosis not present

## 2016-12-31 DIAGNOSIS — F251 Schizoaffective disorder, depressive type: Secondary | ICD-10-CM | POA: Diagnosis not present

## 2016-12-31 DIAGNOSIS — F209 Schizophrenia, unspecified: Secondary | ICD-10-CM | POA: Diagnosis not present

## 2017-03-07 DIAGNOSIS — E7800 Pure hypercholesterolemia, unspecified: Secondary | ICD-10-CM | POA: Diagnosis not present

## 2017-03-07 DIAGNOSIS — I10 Essential (primary) hypertension: Secondary | ICD-10-CM | POA: Diagnosis not present

## 2017-03-07 DIAGNOSIS — Z720 Tobacco use: Secondary | ICD-10-CM | POA: Diagnosis not present

## 2017-03-07 DIAGNOSIS — F2089 Other schizophrenia: Secondary | ICD-10-CM | POA: Diagnosis not present

## 2017-03-17 DIAGNOSIS — Z720 Tobacco use: Secondary | ICD-10-CM | POA: Diagnosis not present

## 2017-03-17 DIAGNOSIS — I10 Essential (primary) hypertension: Secondary | ICD-10-CM | POA: Diagnosis not present

## 2017-03-17 DIAGNOSIS — R413 Other amnesia: Secondary | ICD-10-CM | POA: Diagnosis not present

## 2017-03-17 DIAGNOSIS — E7800 Pure hypercholesterolemia, unspecified: Secondary | ICD-10-CM | POA: Diagnosis not present

## 2017-03-17 DIAGNOSIS — F2089 Other schizophrenia: Secondary | ICD-10-CM | POA: Diagnosis not present

## 2017-03-24 DIAGNOSIS — F251 Schizoaffective disorder, depressive type: Secondary | ICD-10-CM | POA: Diagnosis not present

## 2017-03-24 DIAGNOSIS — F209 Schizophrenia, unspecified: Secondary | ICD-10-CM | POA: Diagnosis not present

## 2017-07-16 DIAGNOSIS — E7800 Pure hypercholesterolemia, unspecified: Secondary | ICD-10-CM | POA: Diagnosis not present

## 2017-07-16 DIAGNOSIS — F2089 Other schizophrenia: Secondary | ICD-10-CM | POA: Diagnosis not present

## 2017-07-16 DIAGNOSIS — I10 Essential (primary) hypertension: Secondary | ICD-10-CM | POA: Diagnosis not present

## 2017-07-16 DIAGNOSIS — Z720 Tobacco use: Secondary | ICD-10-CM | POA: Diagnosis not present

## 2017-07-17 DIAGNOSIS — F2089 Other schizophrenia: Secondary | ICD-10-CM | POA: Diagnosis not present

## 2017-07-17 DIAGNOSIS — Z720 Tobacco use: Secondary | ICD-10-CM | POA: Diagnosis not present

## 2017-07-17 DIAGNOSIS — E7800 Pure hypercholesterolemia, unspecified: Secondary | ICD-10-CM | POA: Diagnosis not present

## 2017-07-17 DIAGNOSIS — I10 Essential (primary) hypertension: Secondary | ICD-10-CM | POA: Diagnosis not present

## 2017-07-23 DIAGNOSIS — E7800 Pure hypercholesterolemia, unspecified: Secondary | ICD-10-CM | POA: Diagnosis not present

## 2017-07-23 DIAGNOSIS — F2089 Other schizophrenia: Secondary | ICD-10-CM | POA: Diagnosis not present

## 2017-07-23 DIAGNOSIS — Z23 Encounter for immunization: Secondary | ICD-10-CM | POA: Diagnosis not present

## 2017-07-23 DIAGNOSIS — Z720 Tobacco use: Secondary | ICD-10-CM | POA: Diagnosis not present

## 2017-07-23 DIAGNOSIS — R05 Cough: Secondary | ICD-10-CM | POA: Diagnosis not present

## 2017-07-23 DIAGNOSIS — I10 Essential (primary) hypertension: Secondary | ICD-10-CM | POA: Diagnosis not present

## 2017-08-26 DIAGNOSIS — F251 Schizoaffective disorder, depressive type: Secondary | ICD-10-CM | POA: Diagnosis not present

## 2017-08-26 DIAGNOSIS — F209 Schizophrenia, unspecified: Secondary | ICD-10-CM | POA: Diagnosis not present

## 2017-10-21 DIAGNOSIS — F251 Schizoaffective disorder, depressive type: Secondary | ICD-10-CM | POA: Diagnosis not present

## 2017-10-21 DIAGNOSIS — F209 Schizophrenia, unspecified: Secondary | ICD-10-CM | POA: Diagnosis not present

## 2017-11-14 DIAGNOSIS — F2089 Other schizophrenia: Secondary | ICD-10-CM | POA: Diagnosis not present

## 2017-11-14 DIAGNOSIS — Z0000 Encounter for general adult medical examination without abnormal findings: Secondary | ICD-10-CM | POA: Diagnosis not present

## 2017-11-14 DIAGNOSIS — I10 Essential (primary) hypertension: Secondary | ICD-10-CM | POA: Diagnosis not present

## 2017-11-14 DIAGNOSIS — Z720 Tobacco use: Secondary | ICD-10-CM | POA: Diagnosis not present

## 2017-11-14 DIAGNOSIS — E7800 Pure hypercholesterolemia, unspecified: Secondary | ICD-10-CM | POA: Diagnosis not present

## 2017-11-19 DIAGNOSIS — Z125 Encounter for screening for malignant neoplasm of prostate: Secondary | ICD-10-CM | POA: Diagnosis not present

## 2017-11-19 DIAGNOSIS — J449 Chronic obstructive pulmonary disease, unspecified: Secondary | ICD-10-CM | POA: Diagnosis not present

## 2017-11-19 DIAGNOSIS — E871 Hypo-osmolality and hyponatremia: Secondary | ICD-10-CM | POA: Diagnosis not present

## 2017-11-19 DIAGNOSIS — I10 Essential (primary) hypertension: Secondary | ICD-10-CM | POA: Diagnosis not present

## 2017-11-19 DIAGNOSIS — Z720 Tobacco use: Secondary | ICD-10-CM | POA: Diagnosis not present

## 2017-11-19 DIAGNOSIS — F2089 Other schizophrenia: Secondary | ICD-10-CM | POA: Diagnosis not present

## 2017-11-19 DIAGNOSIS — Z0000 Encounter for general adult medical examination without abnormal findings: Secondary | ICD-10-CM | POA: Diagnosis not present

## 2017-11-19 DIAGNOSIS — E7800 Pure hypercholesterolemia, unspecified: Secondary | ICD-10-CM | POA: Diagnosis not present

## 2018-01-01 DIAGNOSIS — F209 Schizophrenia, unspecified: Secondary | ICD-10-CM | POA: Diagnosis not present

## 2018-01-01 DIAGNOSIS — F251 Schizoaffective disorder, depressive type: Secondary | ICD-10-CM | POA: Diagnosis not present

## 2018-03-13 DIAGNOSIS — Z125 Encounter for screening for malignant neoplasm of prostate: Secondary | ICD-10-CM | POA: Diagnosis not present

## 2018-03-13 DIAGNOSIS — E7800 Pure hypercholesterolemia, unspecified: Secondary | ICD-10-CM | POA: Diagnosis not present

## 2018-03-13 DIAGNOSIS — F2089 Other schizophrenia: Secondary | ICD-10-CM | POA: Diagnosis not present

## 2018-03-13 DIAGNOSIS — J449 Chronic obstructive pulmonary disease, unspecified: Secondary | ICD-10-CM | POA: Diagnosis not present

## 2018-03-13 DIAGNOSIS — Z720 Tobacco use: Secondary | ICD-10-CM | POA: Diagnosis not present

## 2018-03-20 DIAGNOSIS — F2089 Other schizophrenia: Secondary | ICD-10-CM | POA: Diagnosis not present

## 2018-03-20 DIAGNOSIS — Z720 Tobacco use: Secondary | ICD-10-CM | POA: Diagnosis not present

## 2018-03-20 DIAGNOSIS — E7800 Pure hypercholesterolemia, unspecified: Secondary | ICD-10-CM | POA: Diagnosis not present

## 2018-03-20 DIAGNOSIS — I10 Essential (primary) hypertension: Secondary | ICD-10-CM | POA: Diagnosis not present

## 2018-03-20 DIAGNOSIS — R1032 Left lower quadrant pain: Secondary | ICD-10-CM | POA: Diagnosis not present

## 2018-04-06 DIAGNOSIS — F251 Schizoaffective disorder, depressive type: Secondary | ICD-10-CM | POA: Diagnosis not present

## 2018-04-06 DIAGNOSIS — F209 Schizophrenia, unspecified: Secondary | ICD-10-CM | POA: Diagnosis not present

## 2018-06-18 DIAGNOSIS — F251 Schizoaffective disorder, depressive type: Secondary | ICD-10-CM | POA: Diagnosis not present

## 2018-06-18 DIAGNOSIS — F209 Schizophrenia, unspecified: Secondary | ICD-10-CM | POA: Diagnosis not present

## 2018-07-23 DIAGNOSIS — R1032 Left lower quadrant pain: Secondary | ICD-10-CM | POA: Diagnosis not present

## 2018-07-23 DIAGNOSIS — E7800 Pure hypercholesterolemia, unspecified: Secondary | ICD-10-CM | POA: Diagnosis not present

## 2018-07-23 DIAGNOSIS — I10 Essential (primary) hypertension: Secondary | ICD-10-CM | POA: Diagnosis not present

## 2018-07-23 DIAGNOSIS — F2089 Other schizophrenia: Secondary | ICD-10-CM | POA: Diagnosis not present

## 2018-07-23 DIAGNOSIS — Z720 Tobacco use: Secondary | ICD-10-CM | POA: Diagnosis not present

## 2018-07-30 DIAGNOSIS — E7800 Pure hypercholesterolemia, unspecified: Secondary | ICD-10-CM | POA: Diagnosis not present

## 2018-07-30 DIAGNOSIS — F2089 Other schizophrenia: Secondary | ICD-10-CM | POA: Diagnosis not present

## 2018-07-30 DIAGNOSIS — Z720 Tobacco use: Secondary | ICD-10-CM | POA: Diagnosis not present

## 2018-07-30 DIAGNOSIS — J432 Centrilobular emphysema: Secondary | ICD-10-CM | POA: Diagnosis present

## 2018-07-30 DIAGNOSIS — E786 Lipoprotein deficiency: Secondary | ICD-10-CM | POA: Diagnosis not present

## 2018-07-30 DIAGNOSIS — Z23 Encounter for immunization: Secondary | ICD-10-CM | POA: Diagnosis not present

## 2018-07-30 DIAGNOSIS — I10 Essential (primary) hypertension: Secondary | ICD-10-CM | POA: Diagnosis not present

## 2018-09-01 DIAGNOSIS — F251 Schizoaffective disorder, depressive type: Secondary | ICD-10-CM | POA: Diagnosis not present

## 2018-09-01 DIAGNOSIS — F209 Schizophrenia, unspecified: Secondary | ICD-10-CM | POA: Diagnosis not present

## 2022-10-10 ENCOUNTER — Inpatient Hospital Stay
Admission: EM | Admit: 2022-10-10 | Discharge: 2022-10-16 | DRG: 193 | Disposition: A | Discharge status: Home Health | Payer: Medicare Other | Attending: Osteopathic Medicine | Admitting: Osteopathic Medicine

## 2022-10-10 ENCOUNTER — Emergency Department: Payer: Medicare Other

## 2022-10-10 ENCOUNTER — Other Ambulatory Visit: Payer: Self-pay

## 2022-10-10 ENCOUNTER — Encounter: Payer: Self-pay | Admitting: Internal Medicine

## 2022-10-10 DIAGNOSIS — R918 Other nonspecific abnormal finding of lung field: Secondary | ICD-10-CM | POA: Diagnosis not present

## 2022-10-10 DIAGNOSIS — Z79899 Other long term (current) drug therapy: Secondary | ICD-10-CM | POA: Diagnosis not present

## 2022-10-10 DIAGNOSIS — F17210 Nicotine dependence, cigarettes, uncomplicated: Secondary | ICD-10-CM | POA: Diagnosis present

## 2022-10-10 DIAGNOSIS — R059 Cough, unspecified: Secondary | ICD-10-CM | POA: Diagnosis present

## 2022-10-10 DIAGNOSIS — Z515 Encounter for palliative care: Secondary | ICD-10-CM

## 2022-10-10 DIAGNOSIS — Z1152 Encounter for screening for COVID-19: Secondary | ICD-10-CM | POA: Diagnosis not present

## 2022-10-10 DIAGNOSIS — Z66 Do not resuscitate: Secondary | ICD-10-CM | POA: Diagnosis present

## 2022-10-10 DIAGNOSIS — Z720 Tobacco use: Secondary | ICD-10-CM | POA: Diagnosis present

## 2022-10-10 DIAGNOSIS — Z791 Long term (current) use of non-steroidal anti-inflammatories (NSAID): Secondary | ICD-10-CM | POA: Diagnosis not present

## 2022-10-10 DIAGNOSIS — F209 Schizophrenia, unspecified: Secondary | ICD-10-CM | POA: Diagnosis present

## 2022-10-10 DIAGNOSIS — I10 Essential (primary) hypertension: Secondary | ICD-10-CM | POA: Diagnosis present

## 2022-10-10 DIAGNOSIS — J189 Pneumonia, unspecified organism: Principal | ICD-10-CM | POA: Diagnosis present

## 2022-10-10 DIAGNOSIS — E785 Hyperlipidemia, unspecified: Secondary | ICD-10-CM | POA: Diagnosis present

## 2022-10-10 DIAGNOSIS — J432 Centrilobular emphysema: Secondary | ICD-10-CM | POA: Diagnosis present

## 2022-10-10 DIAGNOSIS — F250 Schizoaffective disorder, bipolar type: Secondary | ICD-10-CM | POA: Diagnosis present

## 2022-10-10 DIAGNOSIS — R051 Acute cough: Secondary | ICD-10-CM

## 2022-10-10 DIAGNOSIS — I4891 Unspecified atrial fibrillation: Secondary | ICD-10-CM | POA: Diagnosis present

## 2022-10-10 DIAGNOSIS — J9601 Acute respiratory failure with hypoxia: Secondary | ICD-10-CM | POA: Diagnosis present

## 2022-10-10 DIAGNOSIS — C7A8 Other malignant neuroendocrine tumors: Secondary | ICD-10-CM | POA: Diagnosis present

## 2022-10-10 DIAGNOSIS — E878 Other disorders of electrolyte and fluid balance, not elsewhere classified: Secondary | ICD-10-CM | POA: Diagnosis present

## 2022-10-10 DIAGNOSIS — J9621 Acute and chronic respiratory failure with hypoxia: Secondary | ICD-10-CM | POA: Diagnosis present

## 2022-10-10 DIAGNOSIS — E871 Hypo-osmolality and hyponatremia: Secondary | ICD-10-CM | POA: Diagnosis present

## 2022-10-10 DIAGNOSIS — A419 Sepsis, unspecified organism: Principal | ICD-10-CM | POA: Diagnosis present

## 2022-10-10 LAB — TSH: TSH: 1.353 u[IU]/mL (ref 0.350–4.500)

## 2022-10-10 LAB — BASIC METABOLIC PANEL
Anion gap: 11 (ref 5–15)
BUN: 8 mg/dL (ref 6–20)
CO2: 30 mmol/L (ref 22–32)
Calcium: 9 mg/dL (ref 8.9–10.3)
Chloride: 80 mmol/L — ABNORMAL LOW (ref 98–111)
Creatinine, Ser: 0.47 mg/dL — ABNORMAL LOW (ref 0.61–1.24)
GFR, Estimated: >60 mL/min (ref >60)
Glucose, Bld: 100 mg/dL — ABNORMAL HIGH (ref 70–99)
Potassium: 3.6 mmol/L (ref 3.5–5.1)
Sodium: 121 mmol/L — ABNORMAL LOW (ref 135–145)

## 2022-10-10 LAB — T4, FREE: Free T4: 0.91 ng/dL (ref 0.61–1.12)

## 2022-10-10 LAB — HEPATIC FUNCTION PANEL
ALT: 23 U/L (ref 0–44)
AST: 28 U/L (ref 15–41)
Albumin: 3.9 g/dL (ref 3.5–5.0)
Alkaline Phosphatase: 56 U/L (ref 38–126)
Bilirubin, Direct: 0.1 mg/dL (ref 0.0–0.2)
Indirect Bilirubin: 0.7 mg/dL (ref 0.3–0.9)
Total Bilirubin: 0.8 mg/dL (ref 0.3–1.2)
Total Protein: 6.8 g/dL (ref 6.5–8.1)

## 2022-10-10 LAB — CBC
HCT: 40.3 % (ref 39.0–52.0)
Hemoglobin: 13.6 g/dL (ref 13.0–17.0)
MCH: 27.8 pg (ref 26.0–34.0)
MCHC: 33.7 g/dL (ref 30.0–36.0)
MCV: 82.2 fL (ref 80.0–100.0)
Platelets: 370 10*3/uL (ref 150–400)
RBC: 4.9 MIL/uL (ref 4.22–5.81)
RDW: 12.9 % (ref 11.5–15.5)
WBC: 8 10*3/uL (ref 4.0–10.5)
nRBC: 0 % (ref 0.0–0.2)

## 2022-10-10 LAB — RESP PANEL BY RT-PCR (RSV, FLU A&B, COVID)  RVPGX2
Influenza A by PCR: NEGATIVE
Influenza B by PCR: NEGATIVE
Resp Syncytial Virus by PCR: NEGATIVE
SARS Coronavirus 2 by RT PCR: NEGATIVE

## 2022-10-10 LAB — BLOOD GAS, VENOUS
Acid-Base Excess: 8.7 mmol/L — ABNORMAL HIGH (ref 0.0–2.0)
Bicarbonate: 36.4 mmol/L — ABNORMAL HIGH (ref 20.0–28.0)
O2 Saturation: 79.9 %
Patient temperature: 37
pCO2, Ven: 63 mmHg — ABNORMAL HIGH (ref 44–60)
pH, Ven: 7.37 (ref 7.25–7.43)
pO2, Ven: 50 mmHg — ABNORMAL HIGH (ref 32–45)

## 2022-10-10 LAB — BRAIN NATRIURETIC PEPTIDE: B Natriuretic Peptide: 64.5 pg/mL (ref 0.0–100.0)

## 2022-10-10 LAB — D-DIMER, QUANTITATIVE: D-Dimer, Quant: <0.27 ug/mL-FEU (ref 0.00–0.50)

## 2022-10-10 LAB — TROPONIN I (HIGH SENSITIVITY): Troponin I (High Sensitivity): 8 ng/L (ref <18)

## 2022-10-10 MED ORDER — ALBUTEROL SULFATE (2.5 MG/3ML) 0.083% IN NEBU: 2.5000 mg | INHALATION_SOLUTION | RESPIRATORY_TRACT | Status: DC | PRN

## 2022-10-10 MED ORDER — HYDRALAZINE HCL 20 MG/ML IJ SOLN
10.0000 mg | INTRAMUSCULAR | Status: DC | PRN
  Administered 2022-10-10: 10 mg via INTRAVENOUS
  Filled 2022-10-10: qty 1

## 2022-10-10 MED ORDER — DOCUSATE SODIUM 100 MG PO CAPS
100.0000 mg | ORAL_CAPSULE | Freq: Two times a day (BID) | ORAL | Status: DC
  Administered 2022-10-10 – 2022-10-12 (×3): 100 mg via ORAL
  Filled 2022-10-10 (×4): qty 1

## 2022-10-10 MED ORDER — ACETAMINOPHEN 325 MG PO TABS: 650.0000 mg | ORAL_TABLET | Freq: Four times a day (QID) | ORAL | Status: DC | PRN

## 2022-10-10 MED ORDER — SODIUM CHLORIDE 0.9 % IV BOLUS
1000.0000 mL | Freq: Once | INTRAVENOUS | Status: AC
  Administered 2022-10-10: 1000 mL via INTRAVENOUS

## 2022-10-10 MED ORDER — GUAIFENESIN ER 600 MG PO TB12: 600.0000 mg | ORAL_TABLET | Freq: Two times a day (BID) | ORAL | Status: DC | PRN

## 2022-10-10 MED ORDER — HEPARIN SODIUM (PORCINE) 5000 UNIT/ML IJ SOLN
5000.0000 [IU] | Freq: Three times a day (TID) | INTRAMUSCULAR | Status: DC
  Administered 2022-10-10 – 2022-10-11 (×2): 5000 [IU] via SUBCUTANEOUS
  Filled 2022-10-10 (×2): qty 1

## 2022-10-10 MED ORDER — SODIUM CHLORIDE 0.9 % IV SOLN
2.0000 g | INTRAVENOUS | Status: AC
  Administered 2022-10-10 – 2022-10-14 (×4): 2 g via INTRAVENOUS
  Filled 2022-10-10 (×5): qty 20

## 2022-10-10 MED ORDER — ACETAMINOPHEN 650 MG RE SUPP: 650.0000 mg | Freq: Four times a day (QID) | RECTAL | Status: DC | PRN

## 2022-10-10 MED ORDER — ALBUTEROL SULFATE HFA 108 (90 BASE) MCG/ACT IN AERS: 1.0000 | INHALATION_SPRAY | RESPIRATORY_TRACT | Status: DC | PRN

## 2022-10-10 MED ORDER — SODIUM CHLORIDE 0.9 % IV SOLN
500.0000 mg | INTRAVENOUS | Status: DC
  Administered 2022-10-10 – 2022-10-12 (×2): 500 mg via INTRAVENOUS
  Filled 2022-10-10 (×3): qty 5

## 2022-10-10 MED ORDER — SODIUM CHLORIDE 0.9 % IV SOLN: INTRAVENOUS | Status: DC

## 2022-10-10 MED ORDER — POLYETHYLENE GLYCOL 3350 17 G PO PACK: 17.0000 g | PACK | Freq: Every day | ORAL | Status: DC | PRN

## 2022-10-10 MED ORDER — BISACODYL 5 MG PO TBEC: 5.0000 mg | DELAYED_RELEASE_TABLET | Freq: Every day | ORAL | Status: DC | PRN

## 2022-10-10 NOTE — ED Triage Notes (Signed)
Pt comes with c/o cough, congestion weakness and hypoxia. Pt was at [facility] and O2 was 88% RA and pt placed on 2L [place]. Pt has junky cough present.   Current O2-91 on 2L [place] BP-193/103

## 2022-10-10 NOTE — H&P (Signed)
History and Physical    Chief Complaint: Cough   HISTORY OF PRESENT ILLNESS: Trevor Montgomery is an 62 y.o. male seen in the emergency room with complaints of cough congestion generalized weakness found to be 84% on room air at home. In the emergency room patient was started on 2 L nasal cannula. Per report patient has been ill since December 20, and was seen by PCP and was started on prednisone and doxycycline and was getting better, but after completing his antibiotic course he started to feel worse again. Patient has past medical history of hypertension, dyslipidemia and schizoaffective disorder.   Pt has  Past Medical History:  Diagnosis Date   HTN (hypertension)    Hyperlipemia    Schizoaffective disorder, bipolar type (HCC)    Review of Systems  Constitutional:  Positive for fatigue.  HENT:  Positive for congestion.   Respiratory:  Positive for cough.   All other systems reviewed and are negative.  No Known Allergies Past Surgical History:  Procedure Laterality Date   FINGER SURGERY         MEDICATIONS: No current outpatient medications     azithromycin 500 mg (10/10/22 1840)   cefTRIAXone (ROCEPHIN)  IV Stopped (10/10/22 1842)    ED Course: Pt in Ed is alert awake oriented afebrile.  Vitals:   10/10/22 1628 10/10/22 1801  BP: (!) 193/103 (!) 181/103  Pulse: 88 92  Resp: (!) 22 (!) 33  Temp: 98.3 F (36.8 C)   TempSrc: Oral   SpO2: 91% 96%  Patient meets sepsis criteria with heart rate and respiratory rate source of infection. I will obtain a lactic acid and follow along with LFTs to further follow if patient becomes severe sepsis. No intake/output data recorded. SpO2: 96 % O2 Flow Rate (L/min): 2 L/min Blood work in ed shows: /Below shows hyponatremia and hypochloremia concerning for dehydration, glucose of 100, normal creatinine, normal CBC.  Results for orders placed or performed during the hospital encounter of 10/10/22 (from the past 24 hour(s))   Basic metabolic panel     Status: Abnormal   Collection Time: 10/10/22  5:30 PM  Result Value Ref Range   Sodium 121 (L) 135 - 145 mmol/L   Potassium 3.6 3.5 - 5.1 mmol/L   Chloride 80 (L) 98 - 111 mmol/L   CO2 30 22 - 32 mmol/L   Glucose, Bld 100 (H) 70 - 99 mg/dL   BUN 8 6 - 20 mg/dL   Creatinine, Ser 9.18 (L) 0.61 - 1.24 mg/dL   Calcium 9.0 8.9 - 47.4 mg/dL   GFR, Estimated >99 >04 mL/min   Anion gap 11 5 - 15  CBC     Status: None   Collection Time: 10/10/22  5:30 PM  Result Value Ref Range   WBC 8.0 4.0 - 10.5 K/uL   RBC 4.90 4.22 - 5.81 MIL/uL   Hemoglobin 13.6 13.0 - 17.0 g/dL   HCT 13.6 58.3 - 84.1 %   MCV 82.2 80.0 - 100.0 fL   MCH 27.8 26.0 - 34.0 pg   MCHC 33.7 30.0 - 36.0 g/dL   RDW 23.0 91.4 - 19.8 %   Platelets 370 150 - 400 K/uL   nRBC 0.0 0.0 - 0.2 %  Resp panel by RT-PCR (RSV, Flu A&B, Covid) Anterior Nasal Swab     Status: None   Collection Time: 10/10/22  5:30 PM   Specimen: Anterior Nasal Swab  Result Value Ref Range   SARS Coronavirus 2 by RT  PCR NEGATIVE NEGATIVE   Influenza A by PCR NEGATIVE NEGATIVE   Influenza B by PCR NEGATIVE NEGATIVE   Resp Syncytial Virus by PCR NEGATIVE NEGATIVE  Blood gas, venous     Status: Abnormal   Collection Time: 10/10/22  6:26 PM  Result Value Ref Range   pH, Ven 7.37 7.25 - 7.43   pCO2, Ven 63 (H) 44 - 60 mmHg   pO2, Ven 50 (H) 32 - 45 mmHg   Bicarbonate 36.4 (H) 20.0 - 28.0 mmol/L   Acid-Base Excess 8.7 (H) 0.0 - 2.0 mmol/L   O2 Saturation 79.9 %   Patient temperature 37.0    Collection site VEIN   Chest x-ray done shows pneumonia left upper lobe and emphysema bilateral interstitial, prominence and bronchial wall thickening.  Unresulted Labs (From admission, onward)     Start     Ordered   10/10/22 1830  Brain natriuretic peptide  Add-on,   AD        10/10/22 1829   10/10/22 1830  Sodium, urine, random  Add-on,   AD        10/10/22 1829   10/10/22 1830  Osmolality, urine  Add-on,   AD        10/10/22  1829   10/10/22 1830  T4, free  Add-on,   AD        10/10/22 1829   10/10/22 1830  TSH  Add-on,   AD        10/10/22 1829   10/10/22 1825  D-dimer, quantitative  Add-on,   AD        10/10/22 1824   10/10/22 1825  Hepatic function panel  Add-on,   AD        10/10/22 1824           Pt has received : Orders Placed This Encounter  Procedures   Resp panel by RT-PCR (RSV, Flu A&B, Covid) Anterior Nasal Swab    Standing Status:   Standing    Number of Occurrences:   1   DG Chest 2 View    If patient pregnant, contact provider.    Standing Status:   Standing    Number of Occurrences:   1    Order Specific Question:   Reason for Exam (SYMPTOM  OR DIAGNOSIS REQUIRED)    Answer:   sob    Order Specific Question:   Radiology Contrast Protocol - do NOT remove file path    Answer:   [email or web address]   Basic metabolic panel    Standing Status:   Standing    Number of Occurrences:   1   CBC    Standing Status:   Standing    Number of Occurrences:   1   D-dimer, quantitative    Standing Status:   Standing    Number of Occurrences:   1   Hepatic function panel    Standing Status:   Standing    Number of Occurrences:   1   Blood gas, venous    Standing Status:   Standing    Number of Occurrences:   1   Brain natriuretic peptide    Standing Status:   Standing    Number of Occurrences:   1   Sodium, urine, random    Standing Status:   Standing    Number of Occurrences:   1   Osmolality, urine    Standing Status:   Standing    Number of Occurrences:   1  T4, free    Standing Status:   Standing    Number of Occurrences:   1   TSH    Standing Status:   Standing    Number of Occurrences:   1   Document Height and Actual Weight    Use scales to weigh patient, not stated or estimated weight.    Standing Status:   Standing    Number of Occurrences:   1   Consult to hospitalist    Standing Status:   Standing    Number of  Occurrences:   1    Order Specific Question:   Place call to:    Answer:   ED, callback [phone]    Order Specific Question:   Reason for Consult    Answer:   Admit    Order Specific Question:   Diagnosis/Clinical Info for Consult:    Answer:   hypoxia, pna, treatment failure   Airborne and Contact precautions    Standing Status:   Standing    Number of Occurrences:   1   ED EKG    Standing Status:   Standing    Number of Occurrences:   1    Order Specific Question:   Reason for Exam    Answer:   Shortness of breath   EKG 12-Lead    Standing Status:   Standing    Number of Occurrences:   1   Saline lock IV    Standing Status:   Standing    Number of Occurrences:   1    Meds ordered this encounter  Medications   sodium chloride 0.9 % bolus 1,000 mL   cefTRIAXone (ROCEPHIN) 2 g in sodium chloride 0.9 % 100 mL IVPB    Order Specific Question:   Antibiotic Indication:    Answer:   CAP   azithromycin (ZITHROMAX) 500 mg in sodium chloride 0.9 % 250 mL IVPB    Order Specific Question:   Antibiotic Indication:    Answer:   CAP     Admission Imaging : DG Chest 2 View Result Date: 10/10/2022 CLINICAL DATA:  Shortness of breath, cough, and congestion EXAM: CHEST - 2 VIEW COMPARISON:  Chest x-ray October 17, 2014 FINDINGS: The cardiomediastinal silhouette is unchanged in contour. Bilateral interstitial prominence and scattered bronchial wall thickening. Possible focal opacity in the left upper lobe seen on the AP view. No pleural effusion or pneumothorax. The visualized upper abdomen is unremarkable. Chronic right rib fracture deformities. IMPRESSION: Findings suggestive of small airways disease with possible superimposed pneumonia in the left upper lobe. Electronically Signed   By: Jacob Moores M.D.   On: 10/10/2022 17:57   Physical Examination: Vitals:   10/10/22 1628 10/10/22 1801  BP: (!) 193/103 (!) 181/103  Pulse: 88 92  Temp: 98.3 F (36.8 C)   Resp: (!) 22 (!) 33  SpO2:  91% 96%  TempSrc: Oral    Physical Exam Vitals and nursing note reviewed.  Constitutional:      General: He is not in acute distress.    Appearance: He is not ill-appearing, toxic-appearing or diaphoretic.  HENT:     Head: Normocephalic and atraumatic.     Right Ear: Hearing and external ear normal.     Left Ear: Hearing and external ear normal.     Nose: Nose normal. No nasal deformity.     Mouth/Throat:     Lips: Pink.     Mouth: Mucous membranes are moist.     Tongue:  No lesions.     Pharynx: Oropharynx is clear.  Eyes:     Extraocular Movements: Extraocular movements intact.  Cardiovascular:     Rate and Rhythm: Normal rate and regular rhythm.     Pulses: Normal pulses.     Heart sounds: Normal heart sounds.  Pulmonary:     Effort: Pulmonary effort is normal.     Breath sounds: Wheezing present.  Abdominal:     General: Bowel sounds are normal. There is no distension.     Palpations: Abdomen is soft. There is no mass.     Tenderness: There is no abdominal tenderness. There is no guarding.     Hernia: No hernia is present.  Musculoskeletal:     Right lower leg: No edema.     Left lower leg: No edema.  Skin:    General: Skin is warm.  Neurological:     General: No focal deficit present.     Mental Status: He is alert and oriented to person, place, and time.     Cranial Nerves: Cranial nerves 2-12 are intact.     Motor: Motor function is intact.  Psychiatric:        Attention and Perception: Attention normal.        Mood and Affect: Mood normal.        Speech: Speech normal.        Behavior: Behavior normal. Behavior is cooperative.        Cognition and Memory: Cognition normal.      Assessment and Plan: * Cough .  Sepsis due to pneumonia (HCC) .  Acute respiratory failure with hypoxia (HCC) .  Tobacco abuse .  Centrilobular emphysema (HCC) .  Hyponatremia .  Schizophrenia (HCC) .  Hypertension .   >Cough: Prn antitussives. 2/2 PNA.    >Sepsis 2/2  PNA: Flu / RSV/ Covid is negative.  MIVF. Antipyretics. PRN.   >A/C resp Failure with Hypoxia: VBG./ Dimer./ TNI/ BNP. Supplemental oxygen.  Duoneb and Albuterol PRN.   >Tobacco abuse:  Nicotine patch.   >Emphysema: Duoneb therapy. Solumedrol 80 mg then 40 mg bid and short taper.    > Hyponatremia: D/D include 2/2 dehydration from diuretic. SIADH/ ? Cancer  ? 2/2 fluphenazine . MIVF. Hold hctz.  >Schizophrenia: Med res pending. Resume home meds once verified.    > HTN: Vitals:   10/10/22 1628 10/10/22 1801  BP: (!) 193/103 (!) 181/103  Prn Hydralazine.      DVT prophylaxis:  Heparin   Code Status:  Full   Family Communication:  Pulley,Patty (Other)  [phone]   Disposition Plan:  Home   Consults called:  None  Admission status: Inpatient   Unit/ Expected LOS: Med tele / 2 days.    Gertha Calkin MD [facility]  6 PM- 2 AM. Please contact me via secure Chat 6 PM-2 AM. [phone]( Pager ) To contact the [facility] Attending or Consulting provider 7A - 7P or covering provider during after hours 7P -7A, for this patient.   Check the care team in [facility] and look for a) attending/consulting [facility] provider listed and b) the [facility] team listed Log into [email or web address] and use [place]'s universal password to access. If you do not have the password, please contact the hospital operator. Locate the [facility] provider you are looking for under [facility] and page to a number that you can be directly reached. If you still have difficulty reaching the provider, please page the [facility] (Director on Call) for  the Hospitalists listed on amion for assistance. [email or web address] 10/10/2022, 6:51 PM

## 2022-10-10 NOTE — ED Triage Notes (Signed)
Sent to ED from [facility] for evaluation.  Patient with ongoing URI symtpoms since Christmas, not improving. Has been seen by PCP.  RA sats initially 88%, placed on 2l/ [place], sats improved to 94-96%  AAOx3.  Skin warm and dyr. NAD

## 2022-10-10 NOTE — ED Provider Notes (Signed)
[facility] Provider Note    Event Date/Time   First MD Initiated Contact with Patient 10/10/22 1647     (approximate)   History   Chief Complaint: Cough and Hypertension   HPI  Trevor Montgomery is a 62 y.o. male with a history of hypertension, schizoaffective disorder who comes to the ED complaining of cough, congestion, generalized weakness, and oxygenation of 84% on room air at home.  On arrival was found to have oxygen saturation of 88% on room air and was placed on 2 L nasal cannula.  Patient has been sick since around September 11, 2022.  Initially saw his primary care doctor who had him take prednisone for wheezing along with a 10-day course of doxycycline.  Patient was feeling better but after the doxycycline completed, symptoms have been worsening again.  Denies chest pain or syncope.     Physical Exam   Triage Vital Signs: ED Triage Vitals  Enc Vitals Group     BP 10/10/22 1628 (!) 193/103     Pulse Rate 10/10/22 1628 88     Resp 10/10/22 1628 (!) 22     Temp 10/10/22 1628 98.3 F (36.8 C)     Temp Source 10/10/22 1628 Oral     SpO2 10/10/22 1628 91 %     Weight --      Height --      Head Circumference --      Peak Flow --      Pain Score 10/10/22 1627 5     Pain Loc --      Pain Edu? --      Excl. in GC? --     Most recent vital signs: Vitals:   10/10/22 1628 10/10/22 1801  BP: (!) 193/103 (!) 181/103  Pulse: 88 92  Resp: (!) 22 (!) 33  Temp: 98.3 F (36.8 C)   SpO2: 91% 96%    General: Awake, no distress.  CV:  Good peripheral perfusion.  Regular rate rhythm Resp:  Normal effort.  Diffuse rhonchi Abd:  No distention.  Soft nontender Other:  No lower extremity edema or calf tenderness   ED Results / Procedures / Treatments   Labs (all labs ordered are listed, but only abnormal results are displayed) Labs Reviewed  RESP PANEL BY RT-PCR (RSV, FLU A&B, COVID)  [id number]  CBC  BASIC METABOLIC PANEL     EKG Interpreted by  me Sinus rhythm rate of 87.  Normal axis, normal intervals.  Normal QRS ST segments and T waves.   RADIOLOGY Chest x-ray interpreted by me, shows consolidation of the lower part of the left upper lobe.  Radiology report reviewed   PROCEDURES:  Procedures   MEDICATIONS ORDERED IN ED: Medications  cefTRIAXone (ROCEPHIN) 2 g in sodium chloride 0.9 % 100 mL IVPB (2 g Intravenous New Bag/Given 10/10/22 1759)  azithromycin (ZITHROMAX) 500 mg in sodium chloride 0.9 % 250 mL IVPB (has no administration in time range)  sodium chloride 0.9 % bolus 1,000 mL (1,000 mLs Intravenous New Bag/Given 10/10/22 1800)     IMPRESSION / MDM / ASSESSMENT AND PLAN / ED COURSE  I reviewed the triage vital signs and the nursing notes.                              Differential diagnosis includes, but is not limited to, pneumonia, pleural effusion, pulmonary edema, status asthmaticus.  Doubt ACS PE dissection, pericardial  effusion  Patient's presentation is most consistent with acute presentation with potential threat to life or bodily function.  Patient presents with hypoxia, persistent cough shortness of breath.  Now hypoxic requiring 2 L nasal cannula.  Chest x-ray reveals left upper lobe pneumonia.  Not septic.  Will start Rocephin and azithromycin and admit for further management.       FINAL CLINICAL IMPRESSION(S) / ED DIAGNOSES   Final diagnoses:  Pneumonia of left upper lobe due to infectious organism  Acute respiratory failure with hypoxia (HCC)     Rx / DC Orders   ED Discharge Orders     None        Note:  This document was prepared using Dragon voice recognition software and may include unintentional dictation errors.   Sharman Cheek, MD 10/10/22 [phone]

## 2022-10-11 ENCOUNTER — Inpatient Hospital Stay: Payer: Medicare Other | Admitting: Anesthesiology

## 2022-10-11 ENCOUNTER — Encounter: Payer: Self-pay | Admitting: Internal Medicine

## 2022-10-11 ENCOUNTER — Inpatient Hospital Stay: Payer: Medicare Other

## 2022-10-11 ENCOUNTER — Inpatient Hospital Stay (HOSPITAL_COMMUNITY)
Admit: 2022-10-11 | Discharge: 2022-10-11 | Disposition: A | Discharge status: Home / Self Care | Payer: Medicare Other | Attending: Internal Medicine | Admitting: Internal Medicine

## 2022-10-11 ENCOUNTER — Encounter
Admission: EM | Disposition: A | Discharge status: Home Health | Payer: Self-pay | Source: Home / Self Care | Attending: Internal Medicine

## 2022-10-11 ENCOUNTER — Ambulatory Visit: Admitting: Radiation Oncology

## 2022-10-11 DIAGNOSIS — J9601 Acute respiratory failure with hypoxia: Secondary | ICD-10-CM | POA: Diagnosis not present

## 2022-10-11 DIAGNOSIS — R918 Other nonspecific abnormal finding of lung field: Secondary | ICD-10-CM | POA: Diagnosis present

## 2022-10-11 DIAGNOSIS — A419 Sepsis, unspecified organism: Secondary | ICD-10-CM | POA: Diagnosis present

## 2022-10-11 DIAGNOSIS — I4891 Unspecified atrial fibrillation: Secondary | ICD-10-CM

## 2022-10-11 HISTORY — PX: VIDEO BRONCHOSCOPY WITH ENDOBRONCHIAL ULTRASOUND: SHX6177

## 2022-10-11 LAB — ECHOCARDIOGRAM COMPLETE
AR max vel: 3.44 cm2
AV Area VTI: 3.33 cm2
AV Area mean vel: 3.4 cm2
AV Mean grad: 4 mmHg
AV Peak grad: 6.3 mmHg
Ao pk vel: 1.25 m/s
Area-P 1/2: 5.23 cm2
Height: 68 in
S' Lateral: 2.5 cm
Weight: 2645.52 oz

## 2022-10-11 LAB — BASIC METABOLIC PANEL
Anion gap: 10 (ref 5–15)
Anion gap: 12 (ref 5–15)
BUN: 10 mg/dL (ref 6–20)
BUN: 11 mg/dL (ref 6–20)
CO2: 28 mmol/L (ref 22–32)
CO2: 25 mmol/L (ref 22–32)
Calcium: 8.9 mg/dL (ref 8.9–10.3)
Calcium: 8.6 mg/dL — ABNORMAL LOW (ref 8.9–10.3)
Chloride: 90 mmol/L — ABNORMAL LOW (ref 98–111)
Chloride: 90 mmol/L — ABNORMAL LOW (ref 98–111)
Creatinine, Ser: 0.45 mg/dL — ABNORMAL LOW (ref 0.61–1.24)
Creatinine, Ser: 0.5 mg/dL — ABNORMAL LOW (ref 0.61–1.24)
GFR, Estimated: >60 mL/min (ref >60)
GFR, Estimated: >60 mL/min (ref >60)
Glucose, Bld: 82 mg/dL (ref 70–99)
Glucose, Bld: 93 mg/dL (ref 70–99)
Potassium: 3.5 mmol/L (ref 3.5–5.1)
Potassium: 3.6 mmol/L (ref 3.5–5.1)
Sodium: 128 mmol/L — ABNORMAL LOW (ref 135–145)
Sodium: 127 mmol/L — ABNORMAL LOW (ref 135–145)

## 2022-10-11 LAB — HIV ANTIBODY (ROUTINE TESTING W REFLEX): HIV Screen 4th Generation wRfx: NONREACTIVE

## 2022-10-11 LAB — TROPONIN I (HIGH SENSITIVITY): Troponin I (High Sensitivity): 9 ng/L (ref <18)

## 2022-10-11 LAB — MAGNESIUM: Magnesium: 1.6 mg/dL — ABNORMAL LOW (ref 1.7–2.4)

## 2022-10-11 SURGERY — BRONCHOSCOPY, WITH EBUS
Anesthesia: General | Laterality: Left

## 2022-10-11 MED ORDER — LIDOCAINE HCL (CARDIAC) PF 100 MG/5ML IV SOSY
PREFILLED_SYRINGE | INTRAVENOUS | Status: DC | PRN
  Administered 2022-10-11: 100 mg via INTRAVENOUS

## 2022-10-11 MED ORDER — OXYCODONE HCL 5 MG/5ML PO SOLN: 5.0000 mg | Freq: Once | ORAL | Status: DC | PRN

## 2022-10-11 MED ORDER — FENTANYL CITRATE (PF) 100 MCG/2ML IJ SOLN: 25.0000 ug | INTRAMUSCULAR | Status: DC | PRN

## 2022-10-11 MED ORDER — ONDANSETRON HCL 4 MG/2ML IJ SOLN
INTRAMUSCULAR | Status: AC
  Filled 2022-10-11: qty 2

## 2022-10-11 MED ORDER — LIDOCAINE HCL (PF) 2 % IJ SOLN
INTRAMUSCULAR | Status: AC
  Filled 2022-10-11: qty 5

## 2022-10-11 MED ORDER — PROPOFOL 10 MG/ML IV BOLUS
INTRAVENOUS | Status: DC | PRN
  Administered 2022-10-11: 100 mg via INTRAVENOUS
  Administered 2022-10-11: 140 ug/kg/min via INTRAVENOUS

## 2022-10-11 MED ORDER — MIDAZOLAM HCL 2 MG/2ML IJ SOLN
INTRAMUSCULAR | Status: AC
  Filled 2022-10-11: qty 2

## 2022-10-11 MED ORDER — ROCURONIUM BROMIDE 100 MG/10ML IV SOLN
INTRAVENOUS | Status: DC | PRN
  Administered 2022-10-11: 50 mg via INTRAVENOUS

## 2022-10-11 MED ORDER — MAGNESIUM SULFATE 2 GM/50ML IV SOLN
2.0000 g | Freq: Once | INTRAVENOUS | Status: AC
  Administered 2022-10-11: 2 g via INTRAVENOUS
  Filled 2022-10-11: qty 50

## 2022-10-11 MED ORDER — IOHEXOL 9 MG/ML PO SOLN
500.0000 mL | ORAL | Status: AC
  Administered 2022-10-11 (×2): 500 mL via ORAL

## 2022-10-11 MED ORDER — ROCURONIUM BROMIDE 10 MG/ML (PF) SYRINGE
PREFILLED_SYRINGE | INTRAVENOUS | Status: AC
  Filled 2022-10-11: qty 10

## 2022-10-11 MED ORDER — TRANEXAMIC ACID FOR EPISTAXIS: 1000.0000 mg | Freq: Once | TOPICAL | Status: DC | PRN

## 2022-10-11 MED ORDER — FENTANYL CITRATE (PF) 100 MCG/2ML IJ SOLN
INTRAMUSCULAR | Status: AC
  Filled 2022-10-11: qty 2

## 2022-10-11 MED ORDER — DEXAMETHASONE SODIUM PHOSPHATE 10 MG/ML IJ SOLN
INTRAMUSCULAR | Status: DC | PRN
  Administered 2022-10-11: 10 mg via INTRAVENOUS

## 2022-10-11 MED ORDER — IOHEXOL 350 MG/ML SOLN
75.0000 mL | Freq: Once | INTRAVENOUS | Status: AC | PRN
  Administered 2022-10-11: 75 mL via INTRAVENOUS

## 2022-10-11 MED ORDER — OXYCODONE HCL 5 MG PO TABS: 5.0000 mg | ORAL_TABLET | Freq: Once | ORAL | Status: DC | PRN

## 2022-10-11 MED ORDER — PHENYLEPHRINE 80 MCG/ML (10ML) SYRINGE FOR IV PUSH (FOR BLOOD PRESSURE SUPPORT)
PREFILLED_SYRINGE | INTRAVENOUS | Status: AC
  Filled 2022-10-11: qty 10

## 2022-10-11 MED ORDER — IOHEXOL 300 MG/ML  SOLN: 100.0000 mL | Freq: Once | INTRAMUSCULAR | Status: DC | PRN

## 2022-10-11 MED ORDER — IPRATROPIUM-ALBUTEROL 0.5-2.5 (3) MG/3ML IN SOLN
3.0000 mL | Freq: Four times a day (QID) | RESPIRATORY_TRACT | Status: DC
  Administered 2022-10-12 – 2022-10-14 (×8): 3 mL via RESPIRATORY_TRACT
  Filled 2022-10-11 (×10): qty 3

## 2022-10-11 MED ORDER — TRANEXAMIC ACID 1000 MG/10ML IV SOLN
1000.0000 mg | Freq: Once | INTRAVENOUS | Status: DC
  Filled 2022-10-11: qty 10

## 2022-10-11 MED ORDER — DEXAMETHASONE SODIUM PHOSPHATE 10 MG/ML IJ SOLN
INTRAMUSCULAR | Status: AC
  Filled 2022-10-11: qty 1

## 2022-10-11 MED ORDER — FENTANYL CITRATE (PF) 100 MCG/2ML IJ SOLN
INTRAMUSCULAR | Status: DC | PRN
  Administered 2022-10-11: 50 ug via INTRAVENOUS

## 2022-10-11 MED ORDER — LACTATED RINGERS IV SOLN: INTRAVENOUS | Status: DC | PRN

## 2022-10-11 MED ORDER — METOPROLOL TARTRATE 5 MG/5ML IV SOLN
2.5000 mg | Freq: Once | INTRAVENOUS | Status: AC
  Administered 2022-10-11: 2.5 mg via INTRAVENOUS
  Filled 2022-10-11: qty 5

## 2022-10-11 MED ORDER — TRANEXAMIC ACID-NACL 1000-0.7 MG/100ML-% IV SOLN
1000.0000 mg | Freq: Once | INTRAVENOUS | Status: DC | PRN
  Filled 2022-10-11: qty 100

## 2022-10-11 MED ORDER — MIDAZOLAM HCL 2 MG/2ML IJ SOLN
INTRAMUSCULAR | Status: DC | PRN
  Administered 2022-10-11: 2 mg via INTRAVENOUS

## 2022-10-11 MED ORDER — CHLORHEXIDINE GLUCONATE 0.12 % MT SOLN
OROMUCOSAL | Status: AC
  Filled 2022-10-11: qty 15

## 2022-10-11 MED ORDER — ONDANSETRON HCL 4 MG/2ML IJ SOLN
INTRAMUSCULAR | Status: DC | PRN
  Administered 2022-10-11: 4 mg via INTRAVENOUS

## 2022-10-11 MED ORDER — ORAL CARE MOUTH RINSE
15.0000 mL | OROMUCOSAL | Status: DC | PRN
  Administered 2022-10-11: 15 mL via OROMUCOSAL

## 2022-10-11 MED ORDER — PROPOFOL 1000 MG/100ML IV EMUL
INTRAVENOUS | Status: AC
  Filled 2022-10-11: qty 100

## 2022-10-11 MED ORDER — SUGAMMADEX SODIUM 200 MG/2ML IV SOLN
INTRAVENOUS | Status: DC | PRN
  Administered 2022-10-11: 150 mg via INTRAVENOUS

## 2022-10-11 MED ORDER — PHENYLEPHRINE HCL (PRESSORS) 10 MG/ML IV SOLN
INTRAVENOUS | Status: DC | PRN
  Administered 2022-10-11: 160 ug via INTRAVENOUS
  Administered 2022-10-11 (×2): 80 ug via INTRAVENOUS
  Administered 2022-10-11: 160 ug via INTRAVENOUS

## 2022-10-11 SURGICAL SUPPLY — 4 items
CATH EMBL 80X6FR 13.5 STRL (CATHETERS) IMPLANT
CATH EMBL 80X6FR 13.5STRL (CATHETERS) ×1 IMPLANT
CATH EMBOLECTOMY 6X80 (CATHETERS) ×1
SET ENDOBRONCIAL BLOCKER 9.0 (MISCELLANEOUS) IMPLANT

## 2022-10-11 NOTE — Consult Note (Signed)
NEW PATIENT EVALUATION  Name: Trevor Montgomery  MRN: [id number]  Date:   10/10/2022     DOB: January 09, 1961   This 62 y.o. 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  With encasement of the left main pulmonary artery  REFERRING PHYSICIAN: No ref. provider found  CHIEF COMPLAINT:  Chief Complaint  Patient presents with   Cough   Hypertension    DIAGNOSIS: The primary encounter diagnosis was Pneumonia of left upper lobe due to infectious organism. A diagnosis of Acute respiratory failure with hypoxia (HCC) was also pertinent to this visit.   PREVIOUS INVESTIGATIONS:  CT scan reviewed Clinical notes reviewed  HPI: Patient is a 62 year old male with multiple medical comorbidities including schizoaffective disorder and hypertension was admitted with community-acquired pneumonia.  He was found to have a lung mass and was admitted with IV antibiotics.  He also developed asymptomatic A-fib which resolved.  CT scan of the chest showed a 2.4 cm left l upper lobe pulmonary nodule as well as a spiculated left lower lobe pulmonary nodule.  There was at least an 8.6 cm mass of conglomerate lymphadenopathy within the left hilar region with encasement of the left main pulmonary artery.  There is also marked narrowing of the left pulmonary arteries due to hilar mass compression.  Patient is scheduled for bronchoscopy for tissue identification.  He is seen today for consideration of palliative radiation therapy.  His cough has somewhat improved he is having no hemoptysis at this time.  He has no evidence of SVC.  PLANNED TREATMENT REGIMEN: Palliative radiation therapy to chest  PAST MEDICAL HISTORY:  has a past medical history of HTN (hypertension), Hyperlipemia, and Schizoaffective disorder, bipolar type ([place]).    PAST SURGICAL HISTORY:  Past Surgical History:  Procedure Laterality Date   FINGER SURGERY      FAMILY HISTORY: family  history is not on file.  SOCIAL HISTORY:  reports that he has been smoking. He does not have any smokeless tobacco history on file. He reports that he does not drink alcohol and does not use drugs.  ALLERGIES: Patient has no known allergies.  MEDICATIONS:  Current Facility-Administered Medications  Medication Dose Route Frequency Provider Last Rate Last Admin   0.9 %  sodium chloride infusion   Intravenous Continuous Para Skeans, MD 75 mL/hr at 10/11/22 0334 Infusion Verify at 10/11/22 0334   acetaminophen (TYLENOL) tablet 650 mg  650 mg Oral Q6H PRN Para Skeans, MD       Or   acetaminophen (TYLENOL) suppository 650 mg  650 mg Rectal Q6H PRN Para Skeans, MD       albuterol (PROVENTIL) (2.5 MG/3ML) 0.083% nebulizer solution 2.5 mg  2.5 mg Nebulization Q2H PRN Para Skeans, MD       azithromycin (ZITHROMAX) 500 mg in sodium chloride 0.9 % 250 mL IVPB  500 mg Intravenous Q24H Para Skeans, MD   Stopped at 10/10/22 1940   bisacodyl (DULCOLAX) EC tablet 5 mg  5 mg Oral Daily PRN Para Skeans, MD       cefTRIAXone (ROCEPHIN) 2 g in sodium chloride 0.9 % 100 mL IVPB  2 g Intravenous Q24H Para Skeans, MD   Stopped at 10/10/22 1842   docusate sodium (COLACE) capsule 100 mg  100 mg Oral BID Para Skeans, MD   100 mg at 10/10/22 2313   guaiFENesin (MUCINEX) 12 hr tablet 600 mg  600 mg  Oral BID PRN Para Skeans, MD       magnesium sulfate IVPB 2 g 50 mL  2 g Intravenous Once Hollice Gong, Mir Earlie Server, MD 50 mL/hr at 10/11/22 1159 2 g at 10/11/22 1159   Oral care mouth rinse  15 mL Mouth Rinse PRN Para Skeans, MD       polyethylene glycol (MIRALAX / GLYCOLAX) packet 17 g  17 g Oral Daily PRN Para Skeans, MD       tranexamic acid (CYKLOKAPRON) IVPB 1,000 mg  1,000 mg Intravenous Once PRN Armando Reichert, MD        ECOG PERFORMANCE STATUS:  2 - Symptomatic, <50% confined to bed  REVIEW OF SYSTEMS: Patient has a history of schizoaffective disorder hypertension recovering from acute tori  failure with hypoxia due to community-acquired pneumonia has history of centrilobular emphysema hyponatremia. Patient denies any weight loss, fatigue, weakness, fever, chills or night sweats. Patient denies any loss of vision, blurred vision. Patient denies any ringing  of the ears or hearing loss. No irregular heartbeat. Patient denies heart murmur or history of fainting. Patient denies any chest pain or pain radiating to her upper extremities. Patient denies any shortness of breath, difficulty breathing at night, cough or hemoptysis. Patient denies any swelling in the lower legs. Patient denies any nausea vomiting, vomiting of blood, or coffee ground material in the vomitus. Patient denies any stomach pain. Patient states has had normal bowel movements no significant constipation or diarrhea. Patient denies any dysuria, hematuria or significant nocturia. Patient denies any problems walking, swelling in the joints or loss of balance. Patient denies any skin changes, loss of hair or loss of weight. Patient denies any excessive worrying or anxiety or significant depression. Patient denies any problems with insomnia. Patient denies excessive thirst, polyuria, polydipsia. Patient denies any swollen glands, patient denies easy bruising or easy bleeding. Patient denies any recent infections, allergies or URI. Patient "s visual fields have not changed significantly in recent time.   PHYSICAL EXAM: BP 134/80 (BP Location: Right Arm)   Pulse 87   Temp 99.1 F (37.3 C)   Resp 18   Ht 5\' 8"  (1.727 m)   Wt 165 lb 5.5 oz (75 kg)   SpO2 96%   BMI 25.14 kg/m  Thin slightly frail male in NAD.  No evidence of facial plethora venous jugular distention noted.  Well-developed well-nourished patient in NAD. HEENT reveals PERLA, EOMI, discs not visualized.  Oral cavity is clear. No oral mucosal lesions are identified. Neck is clear without evidence of cervical or supraclavicular adenopathy. Lungs are clear to A&P. Cardiac  examination is essentially unremarkable with regular rate and rhythm without murmur rub or thrill. Abdomen is benign with no organomegaly or masses noted. Motor sensory and DTR levels are equal and symmetric in the upper and lower extremities. Cranial nerves II through XII are grossly intact. Proprioception is intact. No peripheral adenopathy or edema is identified. No motor or sensory levels are noted. Crude visual fields are within normal range.  LABORATORY DATA: Labs reviewed biopsies pending    RADIOLOGY RESULTS: CT scan reviewed PET CT scan will be performed at some point in his workup.   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  Would plan on delivering 40 Gray in 10  fractions and reevaluate for response.  Risks and benefits of treatment including fatigue skin reaction cough possible radiation esophagitis alteration of blood counts all were described in detail to the patient and his sister.  I have since tentatively set him up for CT simulation Monday we will have him under treatment by the middle to end of next week.  I also like a medical oncology consultation ordered.  Patient and sister both comprehend the recommendations well.  I would like to take this opportunity to thank you for allowing me to participate in the care of your patient.Carmina Miller, MD

## 2022-10-11 NOTE — Anesthesia Preprocedure Evaluation (Addendum)
Anesthesia Evaluation  Patient identified by MRN, date of birth, ID band Patient awake    Reviewed: Allergy & Precautions, NPO status , Patient's Chart, lab work & pertinent test results  History of Anesthesia Complications Negative for: history of anesthetic complications  Airway Mallampati: III  TM Distance: >3 FB Neck ROM: full    Dental  (+) Chipped, Poor Dentition, Missing   Pulmonary pneumonia, unresolved, COPD,  oxygen dependent, Current Smoker and Patient abstained from smoking.   + rhonchi  + decreased breath sounds      Cardiovascular hypertension, (-) angina (-) Past MI Normal cardiovascular exam     Neuro/Psych  PSYCHIATRIC DISORDERS      negative neurological ROS     GI/Hepatic negative GI ROS, Neg liver ROS,,,  Endo/Other  negative endocrine ROS    Renal/GU      Musculoskeletal   Abdominal   Peds  Hematology negative hematology ROS (+)   Anesthesia Other Findings Past Medical History: No date: HTN (hypertension) No date: Hyperlipemia No date: Schizoaffective disorder, bipolar type (HCC)  Past Surgical History: No date: FINGER SURGERY  BMI    Body Mass Index: 25.14 kg/m      Reproductive/Obstetrics negative OB ROS                             Anesthesia Physical Anesthesia Plan  ASA: 4  Anesthesia Plan: General ETT   Post-op Pain Management:    Induction: Intravenous  PONV Risk Score and Plan: Ondansetron, Dexamethasone, Midazolam and Treatment may vary due to age or medical condition  Airway Management Planned: Oral ETT  Additional Equipment:   Intra-op Plan:   Post-operative Plan: Extubation in OR and Possible Post-op intubation/ventilation  Informed Consent: I have reviewed the patients History and Physical, chart, labs and discussed the procedure including the risks, benefits and alternatives for the proposed anesthesia with the patient or authorized  representative who has indicated his/her understanding and acceptance.     Dental Advisory Given  Plan Discussed with: Anesthesiologist, CRNA and Surgeon  Anesthesia Plan Comments: (Patient consented for risks of anesthesia including but not limited to:  - adverse reactions to medications - damage to eyes, teeth, lips or other oral mucosa - nerve damage due to positioning  - sore throat or hoarseness - Damage to heart, brain, nerves, lungs, other parts of body or loss of life  Patient voiced understanding.)       Anesthesia Quick Evaluation

## 2022-10-11 NOTE — Progress Notes (Signed)
Pt alert and oriented x 3. Arrived in unit with elevated BP, was given prn Hydralazine and went into a-fib. MD notified of change in Rhythm . EKG done x 2 and still shows a-fib. Metoprolol given and resolved a-fib after a few minutes. Continue to monitor.

## 2022-10-11 NOTE — Consult Note (Signed)
NAME:  Trevor Montgomery, MRN:  [id number], DOB:  1961/08/15, LOS: 1 ADMISSION DATE:  10/10/2022, CONSULTATION DATE:  10/11/2022 REFERRING MD:  Mir Marry Guan, MD, CHIEF COMPLAINT:  Shortness of breath   History of Present Illness:  Trevor Montgomery is a 62 year old male with past medical history of schizophrenia presenting to the hospital with shortness of breath. Patient is a poor historian, but is able to answer simple questions. Collateral history was obtained from his sister who was present at the bedside.  He reports worsening shortness of breath over the past few months that really accelerated over the past few years. His symptoms mostly consist of exertional dyspnea. He's also been experiencing increased cough that is non-productive. He feels there's something in his chest but he is not able to bring it up. He does not have any sputum production or hemoptysis. He does not have any chest pain, chest tightness, or wheezing.  His shortness of breath worsened prompting him to present to the ED. He was recently seen by his PCP and prescribed a course of antibiotics with improvement. He was noted to be hypoxic down to 88% on room air. He improved with oxygen supplementation. He developed afib with RVR overnight and underwent a CT scan of the chest overnight that was notable for a large left hilar mass with conglomerative lymphadenopathy and associated encasement of the left main pulmonary artery (with narrowing). There were multiple pulmonary nodules noted on exam as well. Pulmonary is consulted for consideration of a biopsy.  Pertinent  Medical History  -HTN -HLD -Schizoaffective disorder  Objective   Blood pressure 134/80, pulse 87, temperature 99.1 F (37.3 C), resp. rate 18, height 5' 8"  (1.727 m), weight 75 kg, SpO2 96 %.        Intake/Output Summary (Last 24 hours) at 10/11/2022 1118 Last data filed at 10/11/2022 0600 Gross per 24 hour  Intake 606.31 ml  Output 500 ml  Net 106.31 ml    Filed Weights   10/10/22 2309  Weight: 75 kg    Examination: Physical Exam Constitutional:      Appearance: Normal appearance. He is not ill-appearing or toxic-appearing.  HENT:     Mouth/Throat:     Mouth: Mucous membranes are moist.  Cardiovascular:     Rate and Rhythm: Normal rate. Rhythm irregular.  Pulmonary:     Breath sounds: Rhonchi (diffuse, more so over the left upper lung field) present.  Abdominal:     Palpations: Abdomen is soft.  Musculoskeletal:        General: Normal range of motion.  Neurological:     General: No focal deficit present.     Mental Status: He is alert and oriented to person, place, and time.     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 He has a long standing history of cigarette smoking and the mass is highly concerning for malignancy. On my review of imaging, the right tracheobronchial tree is not affected, and the findings are mostly in the left upper lobe bronchus which appears to be occluded (by mass or debris).  The patient would benefit from flexible bronchoscopy (airway survey, endobronchial biopsies) as well as EBUS with TBNA for staging as well as to obtain tissue for pathology. I  would recommend a consultation to radiation oncology as the patient will require combination chemo-radiation to manage his left hilar mass. I have counseled the patient and his sisters regarding the gravity of his condition. I have also had a prolonged conversation with [name] two sisters (teleconference with a sister in [place]) and explained to them the diagnosis, high likelihood of malignancy, and risks of the procedure. They understand that he is at high risk of the tumor puncturing through the pulmonary  artery and resulting is massive bleeding resulting in asphyxiation. I have also counseled them to consider changing his code status to DNR/DNI should his condition deteriorate.  Recommendations: -Continue IV antibiotics -duonebs every 6 hours standing -Maintain NPO for procedure -Bronchoscopy with EBUS today -radiation oncology consultation -medical oncology consultation  Armando Reichert, MD [facility]  Labs   CBC: Recent Labs  Lab 10/10/22 1730  WBC 8.0  HGB 13.6  HCT 40.3  MCV 82.2  PLT 256    Basic Metabolic Panel: Recent Labs  Lab 10/10/22 1730 10/10/22 2332  NA 121*  --   K 3.6  --   CL 80*  --   CO2 30  --   GLUCOSE 100*  --   BUN 8  --   CREATININE 0.47*  --   CALCIUM 9.0  --   MG  --  1.6*   GFR: Estimated Creatinine Clearance: 96.2 mL/min (A) (by C-G formula based on SCr of 0.47 mg/dL (L)). Recent Labs  Lab 10/10/22 1730  WBC 8.0    Liver Function Tests: Recent Labs  Lab 10/10/22 1730  AST 28  ALT 23  ALKPHOS 56  BILITOT 0.8  PROT 6.8  ALBUMIN 3.9   No results for input(s): "LIPASE", "AMYLASE" in the last 168 hours. No results for input(s): "AMMONIA" in the last 168 hours.  ABG    Component Value Date/Time   HCO3 36.4 (H) 10/10/2022 1826   O2SAT 79.9 10/10/2022 1826     Coagulation Profile: No results for input(s): "INR", "PROTIME" in the last 168 hours.  Cardiac Enzymes: No results for input(s): "CKTOTAL", "CKMB", "CKMBINDEX", "TROPONINI" in the last 168 hours.  HbA1C: No results found for: "HGBA1C"  CBG: No results for input(s): "GLUCAP" in the last 168 hours.  Review of Systems:   Unable to obtain  Past Medical History:  He,  has a past medical history of HTN (hypertension), Hyperlipemia, and Schizoaffective disorder, bipolar type ([place]).   Surgical History:   Past Surgical History:  Procedure Laterality Date   FINGER SURGERY       Social History:   reports that he has been smoking. He does  not have any smokeless tobacco history on file. He reports that he does not drink alcohol and does not use drugs.   Family History:  His family history is not on file.   Allergies No Known Allergies   Home Medications  Prior to Admission medications   Medication Sig Start Date End Date Taking? Authorizing Provider  albuterol (VENTOLIN HFA) 108 (90 Base) MCG/ACT inhaler Inhale 2 puffs into the lungs every 6 (six) hours as needed for wheezing or shortness of breath. 09/13/22  Yes [provider]  benztropine (COGENTIN) 2 MG tablet Take 2 mg by mouth 2 (two) times daily.   Yes [provider]  fluPHENAZine (PROLIXIN) 5 MG tablet Take 2.5 mg by mouth daily.   Yes [provider]  lisinopril-hydrochlorothiazide (ZESTORETIC) 20-12.5 MG tablet Take 1 tablet by mouth daily. 05/02/22  05/02/23 Yes [provider]  meloxicam (MOBIC) 15 MG tablet Take 15 mg by mouth daily. 05/08/22 05/08/23 Yes [provider]  Multiple Vitamin (MULTI-VITAMIN) tablet Take 1 tablet by mouth daily.   Yes [provider]  nicotine (NICODERM CQ - DOSED IN MG/24 HOURS) 21 mg/24hr patch Place 21 mg onto the skin daily. 05/02/22  Yes [provider]  propranolol (INDERAL) 10 MG tablet Take 5 mg by mouth 2 (two) times daily. 05/02/22  Yes [provider]  simvastatin (ZOCOR) 20 MG tablet Take 20 mg by mouth at bedtime. 05/02/22  Yes [provider]  Vitamin E 400 units TABS Take 400 Units by mouth daily.   Yes [provider]     Total care time: 80 minutes spent interviewing the patient and his sister, examining the patient, reviewing imaging, counseling the patient and his two sisters, placing orders, communicating with other health care professionals and coming up with a treatment plan. This is not inclusive of any procedures performed on the day of service.

## 2022-10-11 NOTE — Progress Notes (Addendum)
PROGRESS NOTE  Trevor Montgomery YPB:[id number] DOB: 1961-06-27 DOA: 10/10/2022 PCP: Barbette Reichmann, MD  Hospital Course/Subjective: This is a 62 year old gentleman with a history of hypertension, schizoaffective disorder admitted to the hospital with complaints of cough, congestion and generalized weakness found to have community-acquired pneumonia in the setting of new lung mass.  Patient has been ill since about December 20, when he was seen by PCP treated with a course of prednisone and doxycycline after which she was feeling better, but after completing his antibiotic course started to feel worse again.  After admission to the hospital, he was given IV hydralazine for elevated blood pressure, and went into asymptomatic rapid A-fib, which resolved after dose of metoprolol.  He was admitted to the hospitalist service, and treatment was initiated with IV azithromycin and IV Rocephin.  He had CT scan of the chest overnight, with evidence of hilar mass and pulmonary was consulted.  Seen and examined this morning, he continues to have a wet sounding cough, resting comfortably on nasal cannula oxygen.  Denies any chest pain, fevers or sputum production.  Assessment/Plan:  Principal Problem:   Acute respiratory failure with hypoxia (HCC), due to community-acquired pneumonia Active Problems:   Cough   CAP (community acquired pneumonia)   Centrilobular emphysema (HCC)   Tobacco abuse   Hyponatremia   Schizophrenia (HCC)   Hypertension   Lung mass    Assessment and Plan: Acute respiratory failure with hypoxia-due to community-acquired pneumonia, left hilar lung mass of unclear etiology -Inpatient admission -Continue empiric antibiotic therapy as noted below -Supplemental oxygen to keep O2 saturation above 90%, wean as tolerated -Pulmonary toilet  Community-acquired pneumonia, patient is not meeting sepsis criteria -Continue empiric IV azithromycin and Rocephin  Lung mass-patient with  left-sided pulmonary nodule, as well as hilar mass -Pulmonology has been consulted, anticipate EBUS later this afternoon, will keep patient n.p.o. 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) and CT abdomen pelvis for staging  Atrial fibrillation -patient has no known prior history of this, went into rapid A-fib after receiving IV hydralazine, resolved after Lopressor given -Continue cardiac telemetry -Replete magnesium -Will check thyroid panel and 2D echo  Hyponatremia-sodium 121 on arrival, patient without confusion, headache or other concerning symptoms; could be SIADH related to his pneumonia/pulmonary mass -Was started on normal saline infusion -Recheck BMP now and every 8 hours  Hypomagnesemia-repleted as above, will recheck with morning labs  Hypertension-holding home lisinopril hydrochlorothiazide due to hyponatremia  Hyperlipidemia-continue home statin   DVT Prophylaxis: Heparin subcu  Code Status: Full code  Family Communication: Discussed with his Sister Patty at the bedside this morning.  Disposition Plan: Likely home at discharge  Consultants: Pulmonary Oncology Radiation oncology  Procedures: None  Antimicrobials: Anti-infectives (From admission, onward)    Start     Dose/Rate Route Frequency Ordered Stop   10/10/22 1730  cefTRIAXone (ROCEPHIN) 2 g in sodium chloride 0.9 % 100 mL IVPB        2 g 200 mL/hr over 30 Minutes Intravenous Every 24 hours 10/10/22 1728 10/15/22 1729   10/10/22 1730  azithromycin (ZITHROMAX) 500 mg in sodium chloride 0.9 % 250 mL IVPB        500 mg 250 mL/hr over 60 Minutes Intravenous Every 24 hours 10/10/22 1728 10/15/22 1729       Objective: Vitals:   10/11/22 0148 10/11/22 0203 10/11/22 0348 10/11/22 0857  BP: 102/78 104/67 132/83 134/80  Pulse: (!) 116 86 97 87  Resp: 20  (!) 22 18  Temp: (!) 97.5 F (36.4 C)  97.8  F (36.6 C) 99.1 F (37.3 C)  TempSrc: Oral     SpO2: 98% 98% 98% 96%  Weight:      Height:        Intake/Output Summary (Last 24 hours) at 10/11/2022 0951 Last data filed at 10/11/2022 0600 Gross per 24 hour  Intake 606.31 ml  Output 500 ml  Net 106.31 ml   Filed Weights   10/10/22 2309  Weight: 75 kg   Exam: General:  Alert, oriented, calm, in no acute distress, wet sounding cough.  He does not look toxic.  Currently he is on 2 L nasal cannula oxygen. Eyes: EOMI, clear sclerea Neck: supple, no masses, trachea mildline  Cardiovascular: RRR, no murmurs or rubs, no peripheral edema  Respiratory: Diffuse bilateral rhonchi with expiratory wheezing.  No tachypnea, retractions or other evidence of respiratory distress.  Breathing comfortably and speaking in full sentences. Abdomen: soft, nontender, nondistended, normal bowel tones heard  Skin: dry, no rashes  Musculoskeletal: no joint effusions, normal range of motion  Psychiatric: appropriate affect, normal speech  Neurologic: extraocular muscles intact, clear speech, moving all extremities with intact sensorium   Data Reviewed: CBC: Recent Labs  Lab 10/10/22 1730  WBC 8.0  HGB 13.6  HCT 40.3  MCV 82.2  PLT 161   Basic Metabolic Panel: Recent Labs  Lab 10/10/22 1730 10/10/22 2332  NA 121*  --   K 3.6  --   CL 80*  --   CO2 30  --   GLUCOSE 100*  --   BUN 8  --   CREATININE 0.47*  --   CALCIUM 9.0  --   MG  --  1.6*   GFR: Estimated Creatinine Clearance: 96.2 mL/min (A) (by C-G formula based on SCr of 0.47 mg/dL (L)). Liver Function Tests: Recent Labs  Lab 10/10/22 1730  AST 28  ALT 23  ALKPHOS 56  BILITOT 0.8  PROT 6.8  ALBUMIN 3.9   No results for input(s): "LIPASE", "AMYLASE" in the last 168 hours. No results for input(s): "AMMONIA" in the last 168 hours. Coagulation Profile: No results for input(s): "INR", "PROTIME" in the last 168 hours. Cardiac Enzymes: No results for input(s): "CKTOTAL",  "CKMB", "CKMBINDEX", "TROPONINI" in the last 168 hours. BNP (last 3 results) No results for input(s): "PROBNP" in the last 8760 hours. HbA1C: No results for input(s): "HGBA1C" in the last 72 hours. CBG: No results for input(s): "GLUCAP" in the last 168 hours. Lipid Profile: No results for input(s): "CHOL", "HDL", "LDLCALC", "TRIG", "CHOLHDL", "LDLDIRECT" in the last 72 hours. Thyroid Function Tests: Recent Labs    10/10/22 1730  TSH 1.353  FREET4 0.91   Anemia Panel: No results for input(s): "VITAMINB12", "FOLATE", "FERRITIN", "TIBC", "IRON", "RETICCTPCT" in the last 72 hours. Urine analysis: No results found for: "COLORURINE", "APPEARANCEUR", "LABSPEC", "PHURINE", "GLUCOSEU", "HGBUR", "BILIRUBINUR", "KETONESUR", "PROTEINUR", "UROBILINOGEN", "NITRITE", "LEUKOCYTESUR" Sepsis Labs: @LABRCNTIP (procalcitonin:4,lacticidven:4)  ) Recent Results (from the past 240 hour(s))  Resp panel by RT-PCR (RSV, Flu A&B, Covid) Anterior Nasal Swab     Status: None   Collection Time: 10/10/22  5:30 PM   Specimen: Anterior Nasal Swab  Result Value Ref Range Status   SARS Coronavirus 2 by RT PCR NEGATIVE NEGATIVE Final    Comment: (NOTE) SARS-CoV-2 target nucleic acids are NOT DETECTED.  The SARS-CoV-2 RNA is generally detectable in upper respiratory specimens during the  acute phase of infection. The lowest concentration of SARS-CoV-2 viral copies this assay can detect is 138 copies/mL. A negative result does not preclude SARS-Cov-2 infection and should not be used as the sole basis for treatment or other patient management decisions. A negative result may occur with  improper specimen collection/handling, submission of specimen other than nasopharyngeal swab, presence of viral mutation(s) within the areas targeted by this assay, and inadequate number of viral copies(<138 copies/mL). A negative result must be combined with clinical observations, patient history, and epidemiological information.  The expected result is Negative.  Fact Sheet for Patients:  BloggerCourse.com  Fact Sheet for Healthcare Providers:  SeriousBroker.it  This test is no t yet approved or cleared by the [place] FDA and  has been authorized for detection and/or diagnosis of SARS-CoV-2 by FDA under an Emergency Use Authorization (EUA). This EUA will remain  in effect (meaning this test can be used) for the duration of the COVID-19 declaration under Section 564(b)(1) of the Act, 21 U.S.C.section 360bbb-3(b)(1), unless the authorization is terminated  or revoked sooner.       Influenza A by PCR NEGATIVE NEGATIVE Final   Influenza B by PCR NEGATIVE NEGATIVE Final    Comment: (NOTE) The Xpert Xpress SARS-CoV-2/FLU/RSV plus assay is intended as an aid in the diagnosis of influenza from Nasopharyngeal swab specimens and should not be used as a sole basis for treatment. Nasal washings and aspirates are unacceptable for Xpert Xpress SARS-CoV-2/FLU/RSV testing.  Fact Sheet for Patients: BloggerCourse.com  Fact Sheet for Healthcare Providers: SeriousBroker.it  This test is not yet approved or cleared by the [place] FDA and has been authorized for detection and/or diagnosis of SARS-CoV-2 by FDA under an Emergency Use Authorization (EUA). This EUA will remain in effect (meaning this test can be used) for the duration of the COVID-19 declaration under Section 564(b)(1) of the Act, 21 U.S.C. section 360bbb-3(b)(1), unless the authorization is terminated or revoked.     Resp Syncytial Virus by PCR NEGATIVE NEGATIVE Final    Comment: (NOTE) Fact Sheet for Patients: BloggerCourse.com  Fact Sheet for Healthcare Providers: SeriousBroker.it  This test is not yet approved or cleared by the [place] FDA and has been authorized for detection and/or  diagnosis of SARS-CoV-2 by FDA under an Emergency Use Authorization (EUA). This EUA will remain in effect (meaning this test can be used) for the duration of the COVID-19 declaration under Section 564(b)(1) of the Act, 21 U.S.C. section 360bbb-3(b)(1), unless the authorization is terminated or revoked.  Performed at [facility], [address] [place], [place] [address]      Studies: CT Angio Chest Pulmonary Embolism (PE) W or WO Contrast  Result Date: 10/11/2022 CLINICAL DATA:  Pulmonary embolism (PE) suspected, high prob EXAM: CT ANGIOGRAPHY CHEST WITH CONTRAST TECHNIQUE: Multidetector CT imaging of the chest was performed using the standard protocol during bolus administration of intravenous contrast. Multiplanar CT image reconstructions and MIPs were obtained to evaluate the vascular anatomy. RADIATION DOSE REDUCTION: This exam was performed according to the departmental dose-optimization program which includes automated exposure control, adjustment of the mA and/or kV according to patient size and/or use of iterative reconstruction technique. CONTRAST:  48mL OMNIPAQUE IOHEXOL 350 MG/ML SOLN COMPARISON:  Chest x-ray 10/10/2022, CT chest 12/12/2008 FINDINGS: Cardiovascular: Satisfactory opacification of the pulmonary arteries to the segmental level. No evidence of pulmonary embolism. Limited evaluation of the subsegmental level due to motion artifact. Marked narrowing of the left pulmonary arteries due to hilar mass.  The main pulmonary artery is normal in caliber. Normal heart size. No significant pericardial effusion. The thoracic aorta is normal in caliber. No atherosclerotic plaque of the thoracic aorta. No coronary artery calcifications. Mediastinum/Nodes: There is a at least 8.6 x 5.5 x 7.5 cm mass with irregular borders centered within the left hilar region with associated encasement of the left main pulmonary artery as well as the origin of all of the left segmental pulmonary  arteries. Associated narrowing of these arteries with almost complete occlusion (less than 1 mm opacification) of the left upper lobe pulmonary artery. The mass is also noted to encase the left or upper and lower lobe bronchi (4:78). Associated occlusion of the right upper lobe bronchi as well as internal debris. Left hilar mass versus conglomerate of lymphadenopathy extensive the pre-vascular region and middle of the mediastinum. Associated prevascular lymph node measuring 1 point 5 cm. As well as a 1.1 cm precarinal lymph node (4:69). No enlarged right hilar or axillary lymph nodes. Thyroid gland, trachea, and esophagus demonstrate no significant findings. Small hiatal hernia. Lungs/Pleura: Limited evaluation due to motion artifact. At least moderate centrilobular emphysematous changes. Diffuse bronchial wall thickening. Left upper lobe 2.4 x 1.2 cm pulmonary nodule (5:78). Left lower lobe spiculated 1.8 x 0.9 cm pulmonary nodule (4:52). Likely several satellite micronodules within the left upper lobe (4:68). Question tree-in-bud nodularity of the left lower lobe. No pleural effusion. No pneumothorax. Upper Abdomen: Left renal fluid dense lesions-likely simple renal cysts. Simple renal cysts, in the absence of clinically indicated signs/symptoms, require no independent follow-up. Musculoskeletal: No chest wall abnormality. No suspicious lytic or blastic osseous lesions. No acute displaced fracture. Multilevel degenerative changes of the spine. Review of the MIP images confirms the above findings. IMPRESSION: 1. No evidence of pulmonary embolism. Limited evaluation of the subsegmental level due to motion artifact. 2. Left upper lobe 2.4 x 1.2 cm pulmonary nodule. Left lower lobe spiculated 1.8 x 0.9 cm pulmonary nodule. 3. At least 8.6 x 5.5 x 7.5 cm mass/conglomeraive lymphadenopathy centered within the left hilar region with associated encasement of the left main pulmonary artery, origin of all of the left  segmental pulmonary arteries, left upper lobe and lower lobe bronchi. Mediastinal lymphadenopathy. 4. Associated marked narrowing of the left pulmonary arteries due to hilar mass. Almost complete occlusion (less than 1 mm opacification) of the left upper lobe pulmonary artery. 5. Associated occlusion of the right upper lobe bronchi as well as internal debris. 6. Question tree-in-bud nodularity of the left lower lobe with limited evaluation on this noncontrast study. This could represent infection/inflammation. 7. Small hiatal hernia. 8. Aortic Atherosclerosis (ICD10-I70.0) and Emphysema (ICD10-J43.9). 9. Additional imaging evaluation or consultation with Pulmonology or Thoracic Surgery recommended. Electronically Signed   By: Tish Frederickson M.D.   On: 10/11/2022 01:09   DG Chest 2 View  Result Date: 10/10/2022 CLINICAL DATA:  Shortness of breath, cough, and congestion EXAM: CHEST - 2 VIEW COMPARISON:  Chest x-ray October 17, 2014 FINDINGS: The cardiomediastinal silhouette is unchanged in contour. Bilateral interstitial prominence and scattered bronchial wall thickening. Possible focal opacity in the left upper lobe seen on the AP view. No pleural effusion or pneumothorax. The visualized upper abdomen is unremarkable. Chronic right rib fracture deformities. IMPRESSION: Findings suggestive of small airways disease with possible superimposed pneumonia in the left upper lobe. Electronically Signed   By: Jacob Moores M.D.   On: 10/10/2022 17:57    Scheduled Meds:  docusate sodium  100 mg Oral BID  heparin  5,000 Units Subcutaneous Q8H    Continuous Infusions:  sodium chloride 75 mL/hr at 10/11/22 0334   azithromycin Stopped (10/10/22 1940)   cefTRIAXone (ROCEPHIN)  IV Stopped (10/10/22 1842)   magnesium sulfate bolus IVPB       LOS: 1 day   Time spent: 31 minutes  Cas Tracz Vergie Living, MD [facility] Pager [phone]  If 7PM-7AM, please contact  night-coverage [email or web address] Password [facility] 10/11/2022, 9:51 AM

## 2022-10-11 NOTE — Evaluation (Signed)
Physical Therapy Evaluation Patient Details Name: Trevor Montgomery MRN: [id number] DOB: Oct 03, 1960 Today's Date: 10/11/2022  History of Present Illness  Pt is a 62 year old M with a history of hypertension, schizoaffective disorder admitted to the hospital with complaints of cough, congestion and generalized weakness found to have community-acquired pneumonia in the setting of new lung mass.  Patient has been ill since about December 20, when he was seen by PCP treated with a course of prednisone and doxycycline after which she was feeling better, but after completing his antibiotic course started to feel worse again. MD assessment includes: Acute respiratory failure with hypoxia due to community-acquired pneumonia, left hilar lung mass of unclear etiology, and hyponatremia.   Clinical Impression  Pt was pleasant and motivated to participate during the session and put forth good effort throughout. Pt found on room air with SpO2 87-88%.  Nursing contacted and requested pt placed on 2LO2/min.  Pt's SpO2 increased to 91-92% on 2L at rest and decreased to 87% after amb 30 feet and then 86% after amb 60 feet both time increasing back to baseline in 15-30 sec upon returning to sitting.  Pt reported no adverse symptoms with ambulation and was able to amb without an AD but with mild drifting/instability that the pt was able to self-correct without assist.  Pt will benefit from HHPT upon discharge to safely address deficits listed in patient problem list for decreased caregiver assistance and eventual return to PLOF.          Recommendations for follow up therapy are one component of a multi-disciplinary discharge planning process, led by the attending physician.  Recommendations may be updated based on patient status, additional functional criteria and insurance authorization.  Follow Up Recommendations Home health PT      Assistance Recommended at Discharge Frequent or constant Supervision/Assistance  Patient  can return home with the following  A little help with walking and/or transfers;A little help with bathing/dressing/bathroom;Assistance with cooking/housework;Assist for transportation;Help with stairs or ramp for entrance    Equipment Recommendations None recommended by PT  Recommendations for Other Services       Functional Status Assessment Patient has had a recent decline in their functional status and demonstrates the ability to make significant improvements in function in a reasonable and predictable amount of time.     Precautions / Restrictions Precautions Precautions: Fall Restrictions Weight Bearing Restrictions: No      Mobility  Bed Mobility               General bed mobility comments: NT, pt in recliner    Transfers Overall transfer level: Needs assistance Equipment used: None Transfers: Sit to/from Stand Sit to Stand: Min guard           General transfer comment: Good concentric and fair eccentric control and stability    Ambulation/Gait Ambulation/Gait assistance: Min guard Gait Distance (Feet): 30 Feet Assistive device: None Gait Pattern/deviations: Step-through pattern, Decreased step length - right, Decreased step length - left, Drifts right/left Gait velocity: decreased     General Gait Details: Slow cadence with short B step length with minor drifting but no overt LOB  Stairs            Wheelchair Mobility    Modified Rankin (Stroke Patients Only)       Balance Overall balance assessment: Needs assistance, History of Falls     Sitting balance - Comments: Unsupported sitting NT   Standing balance support: No upper extremity supported Standing balance-Leahy Scale: Good  Pertinent Vitals/Pain Pain Assessment Pain Assessment: 0-10 Pain Score: 8  Pain Location: L thigh Pain Descriptors / Indicators: Sore Pain Intervention(s): Monitored during session, Patient requesting pain  meds-RN notified, Repositioned    Home Living Family/patient expects to be discharged to:: Private residence Living Arrangements: Other relatives (sister and nephew) Available Help at Discharge: Family;Available 24 hours/day Type of Home: House Home Access: Stairs to enter Entrance Stairs-Rails: None Entrance Stairs-Number of Steps: 1   Home Layout: One level Home Equipment: Cane - single point;Rollator (4 wheels);Shower seat;Grab bars - tub/shower      Prior Function Prior Level of Function : Independent/Modified Independent             Mobility Comments: Ind amb community distances without an AD, 5-10 falls in the last 6 months with pt unsure of cause ADLs Comments: Ind with ADLs     Hand Dominance        Extremity/Trunk Assessment   Upper Extremity Assessment Upper Extremity Assessment: Defer to OT evaluation    Lower Extremity Assessment Lower Extremity Assessment: Generalized weakness       Communication   Communication: No difficulties  Cognition Arousal/Alertness: Awake/alert Behavior During Therapy: WFL for tasks assessed/performed Overall Cognitive Status: Within Functional Limits for tasks assessed                                          General Comments      Exercises Other Exercises Other Exercises: PLB education   Assessment/Plan    PT Assessment Patient needs continued PT services  PT Problem List Decreased strength;Decreased activity tolerance;Decreased balance;Decreased mobility;Decreased knowledge of use of DME       PT Treatment Interventions Gait training;DME instruction;Stair training;Functional mobility training;Therapeutic activities;Therapeutic exercise;Balance training;Patient/family education    PT Goals (Current goals can be found in the Care Plan section)  Acute Rehab PT Goals Patient Stated Goal: To get stronger PT Goal Formulation: With patient Time For Goal Achievement: 10/24/22 Potential to Achieve  Goals: Good    Frequency Min 2X/week     Co-evaluation               AM-PAC PT "6 Clicks" Mobility  Outcome Measure Help needed turning from your back to your side while in a flat bed without using bedrails?: A Little Help needed moving from lying on your back to sitting on the side of a flat bed without using bedrails?: A Little Help needed moving to and from a bed to a chair (including a wheelchair)?: A Little Help needed standing up from a chair using your arms (e.g., wheelchair or bedside chair)?: A Little Help needed to walk in hospital room?: A Little Help needed climbing 3-5 steps with a railing? : A Little 6 Click Score: 18    End of Session Equipment Utilized During Treatment: Gait belt;Oxygen Activity Tolerance: Patient tolerated treatment well Patient left: in chair;with call bell/phone within reach;with chair alarm set;with family/visitor present Nurse Communication: Mobility status PT Visit Diagnosis: Unsteadiness on feet (R26.81);History of falling (Z91.81);Difficulty in walking, not elsewhere classified (R26.2);Muscle weakness (generalized) (M62.81)    Time: 4082-0649 PT Time Calculation (min) (ACUTE ONLY): 36 min   Charges:   PT Evaluation $PT Eval Moderate Complexity: 1 Mod PT Treatments $Gait Training: 8-22 mins       D. Elly Modena PT, DPT 10/11/22, 12:39 PM

## 2022-10-11 NOTE — Consult Note (Signed)
[place]  [phone] [phone]  ID: Trevor Montgomery OB: 1961/04/12  MR#: [id number]  AOZ#:[id number]  Patient Care Team: Tracie Harrier, MD as PCP - General (Internal Medicine)  CHIEF COMPLAINT: Left hilar lung mass, highly suspicious for underlying malignancy.  INTERVAL HISTORY: Patient is a 61 year old male who presented to the emergency room with increased cough, congestion, and generalized weakness.  He was found to be hypoxic at 84% on room air.  Subsequent workup included CT scan which revealed a left upper and left lower lobe pulmonary nodule as well as a large 8.6 x 5.5 x 7.5 mass centered within the left hilar region encasing the left main pulmonary artery.  Patient otherwise feels well.  He has no neurologic complaints.  He denies any recent fevers.  He has a good appetite and denies weight loss.  He has no chest pain or shortness of breath.  He denies any nausea, vomiting, constipation, or diarrhea.  He has no urinary complaints.  Patient offers no further specific complaints today.  REVIEW OF SYSTEMS:   Review of Systems  Constitutional: Negative.  Negative for fever, malaise/fatigue and weight loss.  HENT:  Positive for congestion.   Respiratory:  Positive for cough. Negative for hemoptysis and shortness of breath.   Cardiovascular: Negative.  Negative for chest pain and leg swelling.  Gastrointestinal: Negative.  Negative for abdominal pain.  Genitourinary: Negative.  Negative for dysuria.  Musculoskeletal: Negative.  Negative for back pain.  Skin: Negative.  Negative for rash.  Neurological: Negative.  Negative for dizziness, focal weakness, weakness and headaches.  Psychiatric/Behavioral: Negative.  The patient is not nervous/anxious.     As per HPI. Otherwise, a complete review of systems is negative.  PAST MEDICAL HISTORY: Past Medical History:  Diagnosis Date   HTN (hypertension)    Hyperlipemia    Schizoaffective disorder,  bipolar type ([place])     PAST SURGICAL HISTORY: Past Surgical History:  Procedure Laterality Date   FINGER SURGERY      FAMILY HISTORY: History reviewed. No pertinent family history.  ADVANCED DIRECTIVES (Y/N):  @ADVDIR @  HEALTH MAINTENANCE: Social History   Tobacco Use   Smoking status: Every Day  Substance Use Topics   Alcohol use: No   Drug use: No     Colonoscopy:  PAP:  Bone density:  Lipid panel:  No Known Allergies  Current Facility-Administered Medications  Medication Dose Route Frequency Provider Last Rate Last Admin   0.9 %  sodium chloride infusion   Intravenous Continuous Para Skeans, MD 75 mL/hr at 10/11/22 0334 Infusion Verify at 10/11/22 0334   [MAR Hold] acetaminophen (TYLENOL) tablet 650 mg  650 mg Oral Q6H PRN Para Skeans, MD       Or   Doug Sou Hold] acetaminophen (TYLENOL) suppository 650 mg  650 mg Rectal Q6H PRN Para Skeans, MD       [MAR Hold] albuterol (PROVENTIL) (2.5 MG/3ML) 0.083% nebulizer solution 2.5 mg  2.5 mg Nebulization Q2H PRN Para Skeans, MD       [MAR Hold] azithromycin (ZITHROMAX) 500 mg in sodium chloride 0.9 % 250 mL IVPB  500 mg Intravenous Q24H Para Skeans, MD   Stopped at 10/10/22 1940   [MAR Hold] bisacodyl (DULCOLAX) EC tablet 5 mg  5 mg Oral Daily PRN Para Skeans, MD       [MAR Hold] cefTRIAXone (ROCEPHIN) 2 g in sodium chloride 0.9 % 100 mL IVPB  2 g Intravenous Q24H Florina Ou  V, MD   Stopped at 10/10/22 1842   chlorhexidine (PERIDEX) 0.12 % solution            [MAR Hold] docusate sodium (COLACE) capsule 100 mg  100 mg Oral BID Irena Cords V, MD   100 mg at 10/10/22 2313   [MAR Hold] guaiFENesin (MUCINEX) 12 hr tablet 600 mg  600 mg Oral BID PRN Gertha Calkin, MD       [MAR Hold] ipratropium-albuterol (DUONEB) 0.5-2.5 (3) MG/3ML nebulizer solution 3 mL  3 mL Nebulization QID Raechel Chute, MD       [facility] Hold] Oral care mouth rinse  15 mL Mouth Rinse PRN Irena Cords V, MD   15 mL at 10/11/22 1450   [MAR Hold]  polyethylene glycol (MIRALAX / GLYCOLAX) packet 17 g  17 g Oral Daily PRN Gertha Calkin, MD       [MAR Hold] tranexamic acid (CYKLOKAPRON) injection 1,000 mg  1,000 mg Topical Once Jaynie Bream, [facility]        OBJECTIVE: Vitals:   10/11/22 0857 10/11/22 1435  BP: 134/80 (!) 156/82  Pulse: 87 87  Resp: 18 (!) 22  Temp: 99.1 F (37.3 C) 97.8 F (36.6 C)  SpO2: 96% 93%     Body mass index is 25.14 kg/m.    ECOG FS:1 - Symptomatic but completely ambulatory  General: Well-developed, well-nourished, no acute distress. Eyes: Pink conjunctiva, anicteric sclera. HEENT: Normocephalic, moist mucous membranes. Lungs: No audible wheezing or coughing. Heart: Regular rate and rhythm. Abdomen: Soft, nontender, no obvious distention. Musculoskeletal: No edema, cyanosis, or clubbing. Neuro: Alert, answering all questions appropriately. Cranial nerves grossly intact. Skin: No rashes or petechiae noted. Psych: Normal affect. Lymphatics: No cervical, calvicular, axillary or inguinal LAD.   LAB RESULTS:  Lab Results  Component Value Date   NA 128 (L) 10/11/2022   K 3.5 10/11/2022   CL 90 (L) 10/11/2022   CO2 28 10/11/2022   GLUCOSE 82 10/11/2022   BUN 10 10/11/2022   CREATININE 0.45 (L) 10/11/2022   CALCIUM 8.9 10/11/2022   PROT 6.8 10/10/2022   ALBUMIN 3.9 10/10/2022   AST 28 10/10/2022   ALT 23 10/10/2022   ALKPHOS 56 10/10/2022   BILITOT 0.8 10/10/2022   GFRNONAA >60 10/11/2022    Lab Results  Component Value Date   WBC 8.0 10/10/2022   HGB 13.6 10/10/2022   HCT 40.3 10/10/2022   MCV 82.2 10/10/2022   PLT 370 10/10/2022     STUDIES: CT Angio Chest Pulmonary Embolism (PE) W or WO Contrast  Result Date: 10/11/2022 CLINICAL DATA:  Pulmonary embolism (PE) suspected, high prob EXAM: CT ANGIOGRAPHY CHEST WITH CONTRAST TECHNIQUE: Multidetector CT imaging of the chest was performed using the standard protocol during bolus administration of intravenous contrast. Multiplanar CT  image reconstructions and MIPs were obtained to evaluate the vascular anatomy. RADIATION DOSE REDUCTION: This exam was performed according to the departmental dose-optimization program which includes automated exposure control, adjustment of the mA and/or kV according to patient size and/or use of iterative reconstruction technique. CONTRAST:  63mL OMNIPAQUE IOHEXOL 350 MG/ML SOLN COMPARISON:  Chest x-ray 10/10/2022, CT chest 12/12/2008 FINDINGS: Cardiovascular: Satisfactory opacification of the pulmonary arteries to the segmental level. No evidence of pulmonary embolism. Limited evaluation of the subsegmental level due to motion artifact. Marked narrowing of the left pulmonary arteries due to hilar mass. The main pulmonary artery is normal in caliber. Normal heart size. No significant pericardial effusion. The thoracic aorta is normal in  caliber. No atherosclerotic plaque of the thoracic aorta. No coronary artery calcifications. Mediastinum/Nodes: There is a at least 8.6 x 5.5 x 7.5 cm mass with irregular borders centered within the left hilar region with associated encasement of the left main pulmonary artery as well as the origin of all of the left segmental pulmonary arteries. Associated narrowing of these arteries with almost complete occlusion (less than 1 mm opacification) of the left upper lobe pulmonary artery. The mass is also noted to encase the left or upper and lower lobe bronchi (4:78). Associated occlusion of the right upper lobe bronchi as well as internal debris. Left hilar mass versus conglomerate of lymphadenopathy extensive the pre-vascular region and middle of the mediastinum. Associated prevascular lymph node measuring 1 point 5 cm. As well as a 1.1 cm precarinal lymph node (4:69). No enlarged right hilar or axillary lymph nodes. Thyroid gland, trachea, and esophagus demonstrate no significant findings. Small hiatal hernia. Lungs/Pleura: Limited evaluation due to motion artifact. At least  moderate centrilobular emphysematous changes. Diffuse bronchial wall thickening. Left upper lobe 2.4 x 1.2 cm pulmonary nodule (5:78). Left lower lobe spiculated 1.8 x 0.9 cm pulmonary nodule (4:52). Likely several satellite micronodules within the left upper lobe (4:68). Question tree-in-bud nodularity of the left lower lobe. No pleural effusion. No pneumothorax. Upper Abdomen: Left renal fluid dense lesions-likely simple renal cysts. Simple renal cysts, in the absence of clinically indicated signs/symptoms, require no independent follow-up. Musculoskeletal: No chest wall abnormality. No suspicious lytic or blastic osseous lesions. No acute displaced fracture. Multilevel degenerative changes of the spine. Review of the MIP images confirms the above findings. IMPRESSION: 1. No evidence of pulmonary embolism. Limited evaluation of the subsegmental level due to motion artifact. 2. Left upper lobe 2.4 x 1.2 cm pulmonary nodule. Left lower lobe spiculated 1.8 x 0.9 cm pulmonary nodule. 3. At least 8.6 x 5.5 x 7.5 cm mass/conglomeraive lymphadenopathy centered within the left hilar region with associated encasement of the left main pulmonary artery, origin of all of the left segmental pulmonary arteries, left upper lobe and lower lobe bronchi. Mediastinal lymphadenopathy. 4. Associated marked narrowing of the left pulmonary arteries due to hilar mass. Almost complete occlusion (less than 1 mm opacification) of the left upper lobe pulmonary artery. 5. Associated occlusion of the right upper lobe bronchi as well as internal debris. 6. Question tree-in-bud nodularity of the left lower lobe with limited evaluation on this noncontrast study. This could represent infection/inflammation. 7. Small hiatal hernia. 8. Aortic Atherosclerosis (ICD10-I70.0) and Emphysema (ICD10-J43.9). 9. Additional imaging evaluation or consultation with Pulmonology or Thoracic Surgery recommended. Electronically Signed   By: Tish Frederickson M.D.    On: 10/11/2022 01:09   DG Chest 2 View  Result Date: 10/10/2022 CLINICAL DATA:  Shortness of breath, cough, and congestion EXAM: CHEST - 2 VIEW COMPARISON:  Chest x-ray October 17, 2014 FINDINGS: The cardiomediastinal silhouette is unchanged in contour. Bilateral interstitial prominence and scattered bronchial wall thickening. Possible focal opacity in the left upper lobe seen on the AP view. No pleural effusion or pneumothorax. The visualized upper abdomen is unremarkable. Chronic right rib fracture deformities. IMPRESSION: Findings suggestive of small airways disease with possible superimposed pneumonia in the left upper lobe. Electronically Signed   By: Jacob Moores M.D.   On: 10/10/2022 17:57    ASSESSMENT: Left hilar lung mass, highly suspicious for underlying malignancy.  PLAN:    Left hilar lung mass, highly suspicious for underlying malignancy: CT scan results reviewed independently and reported as  above revealing left upper and left lower lobe pulmonary nodules as well as a large 8.6 x 5.5 x 7.5 mass centered within the left hilar region encasing the left main pulmonary artery.  These are highly suspicious for underlying malignancy.  Patient is undergoing bronchoscopy this afternoon.  He has also been evaluated by radiation oncology who plans to do palliative XRT starting next week.  Patient will also likely require chemotherapy.  He will require MRI of the brain and PET scan prior to initiating treatment.  These can be accomplished as an outpatient.  No further intervention is needed.  Appreciate consult, will follow.   Jeralyn Ruths, MD   10/11/2022 2:59 PM

## 2022-10-11 NOTE — Progress Notes (Addendum)
Received Nurse message that pt was in a.fib on tele and no history right after hydralazine.  Chest ct pe was suspected and CTA was ordered. Repeat EKG advised when pt returns.   Initial EKG.:    Repeat EKG  after hydralazine:    Repeat EKG: shows a.fib rvr.      We will give prn lopressor.  Mg 2+ pending.  CTA abnormal and will need additional eval with CT surgery / Vascular /Pulmonary. CTA chest : Narrative & Impression  CLINICAL DATA:  Pulmonary embolism (PE) suspected, high prob   EXAM: CT ANGIOGRAPHY CHEST WITH CONTRAST   TECHNIQUE: Multidetector CT imaging of the chest was performed using the standard protocol during bolus administration of intravenous contrast. Multiplanar CT image reconstructions and MIPs were obtained to evaluate the vascular anatomy.   RADIATION DOSE REDUCTION: This exam was performed according to the departmental dose-optimization program which includes automated exposure control, adjustment of the mA and/or kV according to patient size and/or use of iterative reconstruction technique.   CONTRAST:  25mL OMNIPAQUE IOHEXOL 350 MG/ML SOLN   COMPARISON:  Chest x-ray 10/10/2022, CT chest 12/12/2008   FINDINGS: Cardiovascular: Satisfactory opacification of the pulmonary arteries to the segmental level. No evidence of pulmonary embolism. Limited evaluation of the subsegmental level due to motion artifact. Marked narrowing of the left pulmonary arteries due to hilar mass. The main pulmonary artery is normal in caliber. Normal heart size. No significant pericardial effusion. The thoracic aorta is normal in caliber. No atherosclerotic plaque of the thoracic aorta. No coronary artery calcifications.   Mediastinum/Nodes:   There is a at least 8.6 x 5.5 x 7.5 cm mass with irregular borders centered within the left hilar region with associated encasement of the left main pulmonary artery as well as the origin of all of the left segmental pulmonary  arteries. Associated narrowing of these arteries with almost complete occlusion (less than 1 mm opacification) of the left upper lobe pulmonary artery. The mass is also noted to encase the left or upper and lower lobe bronchi (4:78). Associated occlusion of the right upper lobe bronchi as well as internal debris.   Left hilar mass versus conglomerate of lymphadenopathy extensive the pre-vascular region and middle of the mediastinum. Associated prevascular lymph node measuring 1 point 5 cm. As well as a 1.1 cm precarinal lymph node (4:69). No enlarged right hilar or axillary lymph nodes.   Thyroid gland, trachea, and esophagus demonstrate no significant findings. Small hiatal hernia.   Lungs/Pleura: Limited evaluation due to motion artifact. At least moderate centrilobular emphysematous changes. Diffuse bronchial wall thickening. Left upper lobe 2.4 x 1.2 cm pulmonary nodule (5:78). Left lower lobe spiculated 1.8 x 0.9 cm pulmonary nodule (4:52). Likely several satellite micronodules within the left upper lobe (4:68). Question tree-in-bud nodularity of the left lower lobe. No pleural effusion. No pneumothorax.   Upper Abdomen: Left renal fluid dense lesions-likely simple renal cysts. Simple renal cysts, in the absence of clinically indicated signs/symptoms, require no independent follow-up.   Musculoskeletal:   No chest wall abnormality.   No suspicious lytic or blastic osseous lesions. No acute displaced fracture. Multilevel degenerative changes of the spine.   Review of the MIP images confirms the above findings.   IMPRESSION: 1. No evidence of pulmonary embolism. Limited evaluation of the subsegmental level due to motion artifact. 2. Left upper lobe 2.4 x 1.2 cm pulmonary nodule. Left lower lobe spiculated 1.8 x 0.9 cm pulmonary nodule. 3. At least 8.6 x 5.5 x 7.5  cm mass/conglomeraive lymphadenopathy centered within the left hilar region with associated encasement  of the left main pulmonary artery, origin of all of the left segmental pulmonary arteries, left upper lobe and lower lobe bronchi. Mediastinal lymphadenopathy. 4. Associated marked narrowing of the left pulmonary arteries due to hilar mass. Almost complete occlusion (less than 1 mm opacification) of the left upper lobe pulmonary artery. 5. Associated occlusion of the right upper lobe bronchi as well as internal debris. 6. Question tree-in-bud nodularity of the left lower lobe with limited evaluation on this noncontrast study. This could represent infection/inflammation. 7. Small hiatal hernia. 8. Aortic Atherosclerosis (ICD10-I70.0) and Emphysema (ICD10-J43.9). 9. Additional imaging evaluation or consultation with Pulmonology or Thoracic Surgery recommended.       Paged vascular @ 1:59 AM.  Paged CT sx Dr.Hendrickson and recommended pulmonary eval and bronch with biopsy.  Paged Dr.Dgayli and d/w him about consult and will see in am.  Called contact and no answer.  Called nurse and pt is in sinus after the lopressor.   Plan for pulmonary consult in am: Dr.Dgayli.  Called Ms. [name] [phone].

## 2022-10-11 NOTE — Transfer of Care (Signed)
Immediate Anesthesia Transfer of Care Note  Patient: Trevor Montgomery  Procedure(s) Performed: VIDEO BRONCHOSCOPY WITH ENDOBRONCHIAL ULTRASOUND (Left)  Patient Location: PACU  Anesthesia Type:General  Level of Consciousness: drowsy and patient cooperative  Airway & Oxygen Therapy: Patient Spontanous Breathing and Patient connected to face mask oxygen  Post-op Assessment: Report given to RN and Post -op Vital signs reviewed and stable  Post vital signs: Reviewed and stable  Last Vitals:  Vitals Value Taken Time  BP 171/81 10/11/22 1653  Temp 36.3 C 10/11/22 1653  Pulse 105 10/11/22 1655  Resp 29 10/11/22 1655  SpO2 99 % 10/11/22 1655  Vitals shown include unvalidated device data.  Last Pain:  Vitals:   10/11/22 1435  TempSrc: Temporal  PainSc: 0-No pain         Complications: No notable events documented.

## 2022-10-11 NOTE — Evaluation (Signed)
Occupational Therapy Evaluation Patient Details Name: Trevor Montgomery MRN: [id number] DOB: [birth year] Today's Date: 10/11/2022   History of Present Illness Pt is a 62 year old M with a history of hypertension, schizoaffective disorder admitted to the hospital with complaints of cough, congestion and generalized weakness found to have community-acquired pneumonia in the setting of new lung mass.  Patient has been ill since about December 20, when he was seen by PCP treated with a course of prednisone and doxycycline after which she was feeling better, but after completing his antibiotic course started to feel worse again. MD assessment includes: Acute respiratory failure with hypoxia due to community-acquired pneumonia, left hilar lung mass of unclear etiology, and hyponatremia.   Clinical Impression   Patient presenting with decreased Ind in self care,balance, functional mobility/transfers, endurance, and safety awareness. Patient reports being Ind at baseline and living at home with nephew and sister. They are always available to pt if needed. Patient currently functioning at close supervision for mobility without use of AD into bathroom for toileting needs and hand hygiene. Pt placed on RA during session and desaturated to 86 during session needing 2Ls O2 placed back on pt to return to the 90's. Patient will benefit from acute OT to increase overall independence in the areas of ADLs, functional mobility, and safety awareness in order to safely discharge home with family support.      Recommendations for follow up therapy are one component of a multi-disciplinary discharge planning process, led by the attending physician.  Recommendations may be updated based on patient status, additional functional criteria and insurance authorization.   Follow Up Recommendations  Home health OT     Assistance Recommended at Discharge Intermittent Supervision/Assistance  Patient can return home with the following A  little help with walking and/or transfers;A little help with bathing/dressing/bathroom;Help with stairs or ramp for entrance;Assist for transportation;Assistance with cooking/housework    Functional Status Assessment  Patient has had a recent decline in their functional status and demonstrates the ability to make significant improvements in function in a reasonable and predictable amount of time.  Equipment Recommendations  None recommended by OT       Precautions / Restrictions Precautions Precautions: Fall Restrictions Weight Bearing Restrictions: No      Mobility Bed Mobility Overal bed mobility: Modified Independent             General bed mobility comments: HOB elevated but no physical assistance provided    Transfers Overall transfer level: Needs assistance Equipment used: None Transfers: Sit to/from Stand Sit to Stand: Supervision                  Balance Overall balance assessment: Needs assistance, History of Falls Sitting-balance support: Feet supported Sitting balance-Leahy Scale: Good     Standing balance support: No upper extremity supported Standing balance-Leahy Scale: Good                             ADL either performed or assessed with clinical judgement   ADL Overall ADL's : Needs assistance/impaired     Grooming: Standing;Supervision/safety                   Toilet Transfer: Supervision/safety   Toileting- Clothing Manipulation and Hygiene: Supervision/safety               Vision Patient Visual Report: No change from baseline  Pertinent Vitals/Pain Pain Assessment Pain Assessment: No/denies pain Pain Score: 8  Pain Location: L thigh Pain Descriptors / Indicators: Sore Pain Intervention(s): Monitored during session, Repositioned, Premedicated before session     Hand Dominance Right   Extremity/Trunk Assessment Upper Extremity Assessment Upper Extremity Assessment: Generalized  weakness   Lower Extremity Assessment Lower Extremity Assessment: Generalized weakness       Communication Communication Communication: No difficulties   Cognition Arousal/Alertness: Awake/alert Behavior During Therapy: WFL for tasks assessed/performed Overall Cognitive Status: Within Functional Limits for tasks assessed                                                  Home Living Family/patient expects to be discharged to:: Private residence Living Arrangements: Other relatives Available Help at Discharge: Family;Available 24 hours/day Type of Home: House Home Access: Stairs to enter [employer] of Steps: 1 Entrance Stairs-Rails: None Home Layout: One level     Bathroom Shower/Tub: Tub/shower unit;Walk-in shower   Bathroom Toilet: Handicapped height     Home Equipment: Cane - single point;Rollator (4 wheels);Shower seat;Grab bars - tub/shower          Prior Functioning/Environment Prior Level of Function : Independent/Modified Independent             Mobility Comments: Ind amb community distances without an AD, 5-10 falls in the last 6 months with pt unsure of cause ADLs Comments: Ind with ADLs        OT Problem List: Decreased strength;Decreased activity tolerance;Impaired balance (sitting and/or standing)      OT Treatment/Interventions: Self-care/ADL training;Therapeutic exercise;Therapeutic activities;Patient/family education;Balance training;Energy conservation    OT Goals(Current goals can be found in the care plan section) Acute Rehab OT Goals Patient Stated Goal: to go home OT Goal Formulation: With patient Time For Goal Achievement: 10/25/22 Potential to Achieve Goals: Good ADL Goals Pt Will Perform Grooming: with modified independence Pt Will Perform Lower Body Dressing: with modified independence;sit to/from stand Pt Will Transfer to Toilet: with modified independence;ambulating Pt Will Perform Toileting -  Clothing Manipulation and hygiene: with modified independence;sit to/from stand  OT Frequency: Min 2X/week       AM-PAC OT "6 Clicks" Daily Activity     Outcome Measure Help from another person eating meals?: None Help from another person taking care of personal grooming?: None Help from another person toileting, which includes using toliet, bedpan, or urinal?: A Little Help from another person bathing (including washing, rinsing, drying)?: A Little Help from another person to put on and taking off regular upper body clothing?: None Help from another person to put on and taking off regular lower body clothing?: A Little 6 Click Score: 21   End of Session Nurse Communication: Mobility status  Activity Tolerance: Patient tolerated treatment well Patient left: in bed;with call bell/phone within reach;with bed alarm set  OT Visit Diagnosis: Unsteadiness on feet (R26.81);Repeated falls (R29.6);Muscle weakness (generalized) (M62.81)                Time: 0950-1005 OT Time Calculation (min): 15 min Charges:  OT General Charges $OT Visit: 1 Visit OT Evaluation $OT Eval Moderate Complexity: 1 Mod OT Treatments $Self Care/Home Management : 8-22 mins  Jackquline Denmark, MS, OTR/L , CBIS ascom [phone]  10/11/22, 1:24 PM

## 2022-10-11 NOTE — Progress Notes (Signed)
*  PRELIMINARY RESULTS* Echocardiogram 2D Echocardiogram has been performed.  Trevor Montgomery 10/11/2022, 1:58 PM

## 2022-10-11 NOTE — H&P (View-Only) (Signed)
NAME:  Trevor Montgomery, MRN:  [id number], DOB:  11/18/1960, LOS: 1 ADMISSION DATE:  10/10/2022, CONSULTATION DATE:  10/11/2022 REFERRING MD:  Mir Marry Guan, MD, CHIEF COMPLAINT:  Shortness of breath   History of Present Illness:  Trevor Montgomery is a 62 year old male with past medical history of schizophrenia presenting to the hospital with shortness of breath. Patient is a poor historian, but is able to answer simple questions. Collateral history was obtained from his sister who was present at the bedside.  He reports worsening shortness of breath over the past few months that really accelerated over the past few years. His symptoms mostly consist of exertional dyspnea. He's also been experiencing increased cough that is non-productive. He feels there's something in his chest but he is not able to bring it up. He does not have any sputum production or hemoptysis. He does not have any chest pain, chest tightness, or wheezing.  His shortness of breath worsened prompting him to present to the ED. He was recently seen by his PCP and prescribed a course of antibiotics with improvement. He was noted to be hypoxic down to 88% on room air. He improved with oxygen supplementation. He developed afib with RVR overnight and underwent a CT scan of the chest overnight that was notable for a large left hilar mass with conglomerative lymphadenopathy and associated encasement of the left main pulmonary artery (with narrowing). There were multiple pulmonary nodules noted on exam as well. Pulmonary is consulted for consideration of a biopsy.  Pertinent  Medical History  -HTN -HLD -Schizoaffective disorder  Objective   Blood pressure 134/80, pulse 87, temperature 99.1 F (37.3 C), resp. rate 18, height 5' 8"  (1.727 m), weight 75 kg, SpO2 96 %.        Intake/Output Summary (Last 24 hours) at 10/11/2022 1118 Last data filed at 10/11/2022 0600 Gross per 24 hour  Intake 606.31 ml  Output 500 ml  Net 106.31 ml    Filed Weights   10/10/22 2309  Weight: 75 kg    Examination: Physical Exam Constitutional:      Appearance: Normal appearance. He is not ill-appearing or toxic-appearing.  HENT:     Mouth/Throat:     Mouth: Mucous membranes are moist.  Cardiovascular:     Rate and Rhythm: Normal rate. Rhythm irregular.  Pulmonary:     Breath sounds: Rhonchi (diffuse, more so over the left upper lung field) present.  Abdominal:     Palpations: Abdomen is soft.  Musculoskeletal:        General: Normal range of motion.  Neurological:     General: No focal deficit present.     Mental Status: He is alert and oriented to person, place, and time.     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 He has a long standing history of cigarette smoking and the mass is highly concerning for malignancy. On my review of imaging, the right tracheobronchial tree is not affected, and the findings are mostly in the left upper lobe bronchus which appears to be occluded (by mass or debris).  The patient would benefit from flexible bronchoscopy (airway survey, endobronchial biopsies) as well as EBUS with TBNA for staging as well as to obtain tissue for pathology. I  would recommend a consultation to radiation oncology as the patient will require combination chemo-radiation to manage his left hilar mass. I have counseled the patient and his sisters regarding the gravity of his condition. I have also had a prolonged conversation with Trevor Montgomery two sisters (teleconference with a sister in [place]) and explained to them the diagnosis, high likelihood of malignancy, and risks of the procedure. They understand that he is at high risk of the tumor puncturing through the pulmonary  artery and resulting is massive bleeding resulting in asphyxiation. I have also counseled them to consider changing his code status to DNR/DNI should his condition deteriorate.  Recommendations: -Continue IV antibiotics -duonebs every 6 hours standing -Maintain NPO for procedure -Bronchoscopy with EBUS today -radiation oncology consultation -medical oncology consultation  Armando Reichert, MD [facility]  Labs   CBC: Recent Labs  Lab 10/10/22 1730  WBC 8.0  HGB 13.6  HCT 40.3  MCV 82.2  PLT 161    Basic Metabolic Panel: Recent Labs  Lab 10/10/22 1730 10/10/22 2332  NA 121*  --   K 3.6  --   CL 80*  --   CO2 30  --   GLUCOSE 100*  --   BUN 8  --   CREATININE 0.47*  --   CALCIUM 9.0  --   MG  --  1.6*   GFR: Estimated Creatinine Clearance: 96.2 mL/min (A) (by C-G formula based on SCr of 0.47 mg/dL (L)). Recent Labs  Lab 10/10/22 1730  WBC 8.0    Liver Function Tests: Recent Labs  Lab 10/10/22 1730  AST 28  ALT 23  ALKPHOS 56  BILITOT 0.8  PROT 6.8  ALBUMIN 3.9   No results for input(s): "LIPASE", "AMYLASE" in the last 168 hours. No results for input(s): "AMMONIA" in the last 168 hours.  ABG    Component Value Date/Time   HCO3 36.4 (H) 10/10/2022 1826   O2SAT 79.9 10/10/2022 1826     Coagulation Profile: No results for input(s): "INR", "PROTIME" in the last 168 hours.  Cardiac Enzymes: No results for input(s): "CKTOTAL", "CKMB", "CKMBINDEX", "TROPONINI" in the last 168 hours.  HbA1C: No results found for: "HGBA1C"  CBG: No results for input(s): "GLUCAP" in the last 168 hours.  Review of Systems:   Unable to obtain  Past Medical History:  He,  has a past medical history of HTN (hypertension), Hyperlipemia, and Schizoaffective disorder, bipolar type ([place]).   Surgical History:   Past Surgical History:  Procedure Laterality Date   FINGER SURGERY       Social History:   reports that he has been smoking. He does  not have any smokeless tobacco history on file. He reports that he does not drink alcohol and does not use drugs.   Family History:  His family history is not on file.   Allergies No Known Allergies   Home Medications  Prior to Admission medications   Medication Sig Start Date End Date Taking? Authorizing Provider  albuterol (VENTOLIN HFA) 108 (90 Base) MCG/ACT inhaler Inhale 2 puffs into the lungs every 6 (six) hours as needed for wheezing or shortness of breath. 09/13/22  Yes [provider]  benztropine (COGENTIN) 2 MG tablet Take 2 mg by mouth 2 (two) times daily.   Yes [provider]  fluPHENAZine (PROLIXIN) 5 MG tablet Take 2.5 mg by mouth daily.   Yes [provider]  lisinopril-hydrochlorothiazide (ZESTORETIC) 20-12.5 MG tablet Take 1 tablet by mouth daily. 05/02/22  05/02/23 Yes [provider]  meloxicam (MOBIC) 15 MG tablet Take 15 mg by mouth daily. 05/08/22 05/08/23 Yes [provider]  Multiple Vitamin (MULTI-VITAMIN) tablet Take 1 tablet by mouth daily.   Yes [provider]  nicotine (NICODERM CQ - DOSED IN MG/24 HOURS) 21 mg/24hr patch Place 21 mg onto the skin daily. 05/02/22  Yes [provider]  propranolol (INDERAL) 10 MG tablet Take 5 mg by mouth 2 (two) times daily. 05/02/22  Yes [provider]  simvastatin (ZOCOR) 20 MG tablet Take 20 mg by mouth at bedtime. 05/02/22  Yes [provider]  Vitamin E 400 units TABS Take 400 Units by mouth daily.   Yes [provider]     Total care time: 80 minutes spent interviewing the patient and his sister, examining the patient, reviewing imaging, counseling the patient and his two sisters, placing orders, communicating with other health care professionals and coming up with a treatment plan. This is not inclusive of any procedures performed on the day of service.

## 2022-10-11 NOTE — Op Note (Addendum)
Flexible and EBUS Bronchoscopy Procedure Note  Arlo Butt  [id number]  April 16, 1961  Date:10/11/22  Time:4:59 PM   Provider Performing:Jerre Vandrunen   Procedure: Flexible bronchoscopy and EBUS Bronchoscopy  Indication(s) LUL hilar mass  Consent Risks of the procedure as well as the alternatives and risks of each were explained to the patient and/or caregiver.  Consent for the procedure was obtained.  Anesthesia General Anesthesia   Time Out Verified patient identification, verified procedure, site/side was marked, verified correct patient position, special equipment/implants available, medications/allergies/relevant history reviewed, required imaging and test results available.   Sterile Technique Usual hand hygiene, masks, gowns, and gloves were used   Procedure Description  Diagnostic bronchoscope TH-190 was advanced through endotracheal tube and into airway. We first noted a significant amount of thin secretions pooling at the level of the carina and extending into the left mainstem bronchus and into the left lower lobe bronchus. These were therapeutically suctioned into a trap that was sent for culture. I then examined the airways down to subsegmental level. The right tracheobronchial tree was within normal with no endobronchial lesions, narrowing, masses, or secretions. The LUL bronchus was noted to be significantly narrowed with extrinsic compression. The effects of this were also noted in the left lower lobe bronchus that was mildly narrowed secondary to extrinsic compression from the masse effect.  Secretions:    Extrinsic Narrowing of the LUL Bronchus:    EBUS Portion:  Following diagnostic evaluation, the therapeutic bronchoscope was then removed and the EBUS bronchoscope was advanced into airway with stations 7 (5 passes) then 11L (4 passes) then the LUL mass (7 passes) biopsied using a 21G Olympus ViziShot 2 needle and sent for slide, cell block, and flow  cytometry.  The EBUS bronchoscope was removed after assuring no active bleeding from biopsy site.  Station 7 biopsy    LUL mass biopsy    Findings: Notable mediastinal and hilar lymphadenopathy (7 and 11L), extrinsic compression of the LUL bronchus, mild compression of the LLL bronchus. Mass and Lymphadenopathy biopsied.   Complications/Tolerance None; patient tolerated the procedure well. Chest X-ray is needed post procedure.   EBL Minimal   Specimen(s) -respiratory secretions for culture -EBUS to stations 7, 11L and mass (cytology and flow cytometry)  Armando Reichert, MD [facility] 10/11/2022 5:05 PM

## 2022-10-11 NOTE — Interval H&P Note (Signed)
Patient seen and examined and is appropriate for the procedure.

## 2022-10-11 NOTE — TOC Progression Note (Signed)
Transition of Care [facility]) - Progression Note    Patient Details  Name: Trevor Montgomery MRN: [id number] Date of Birth: 1961/02/08  Transition of Care [facility]) CM/SW Contact  Marlowe Sax, RN Phone Number: 10/11/2022, 10:35 AM  Clinical Narrative:   Patient comes form PCP office for URI, TOC following for possible Oxygen needs as well as other needs     Expected Discharge Plan: Home/Self Care Barriers to Discharge: No Barriers Identified  Expected Discharge Plan and Services   Discharge Planning Services: CM Consult   Living arrangements for the past 2 months: Single Family Home                                       Social Determinants of Health (SDOH) Interventions SDOH Screenings   Food Insecurity: No Food Insecurity (10/10/2022)  Housing: Low Risk  (10/10/2022)  Transportation Needs: No Transportation Needs (10/10/2022)  Utilities: Not At Risk (10/10/2022)  Tobacco Use: High Risk (10/10/2022)    Readmission Risk Interventions     No data to display

## 2022-10-11 NOTE — Plan of Care (Signed)
  Problem: Education: Goal: Knowledge of disease or condition will improve Outcome: Progressing   Problem: Activity: Goal: Ability to tolerate increased activity will improve Outcome: Progressing   Problem: Respiratory: Goal: Ability to maintain a clear airway will improve Outcome: Progressing   Problem: Activity: Goal: Ability to tolerate increased activity will improve Outcome: Progressing   Problem: Respiratory: Goal: Ability to maintain adequate ventilation will improve Outcome: Progressing   Problem: Skin Integrity: Goal: Risk for impaired skin integrity will decrease Outcome: Progressing

## 2022-10-11 NOTE — Anesthesia Postprocedure Evaluation (Signed)
Anesthesia Post Note  Patient: Trevor Montgomery  Procedure(s) Performed: VIDEO BRONCHOSCOPY WITH ENDOBRONCHIAL ULTRASOUND (Left)  Patient location during evaluation: PACU Anesthesia Type: General Level of consciousness: awake and alert Pain management: pain level controlled Vital Signs Assessment: post-procedure vital signs reviewed and stable Respiratory status: spontaneous breathing, nonlabored ventilation, respiratory function stable and patient connected to nasal cannula oxygen Cardiovascular status: blood pressure returned to baseline and stable Postop Assessment: no apparent nausea or vomiting Anesthetic complications: no   No notable events documented.   Last Vitals:  Vitals:   10/11/22 1715 10/11/22 1730  BP: (!) 144/82 128/82  Pulse: (!) 101 (!) 104  Resp: 18 16  Temp:  36.7 C  SpO2: 95% 95%    Last Pain:  Vitals:   10/11/22 1730  TempSrc:   PainSc: 0-No pain                 Cleda Mccreedy Kyiah Canepa

## 2022-10-11 NOTE — Anesthesia Procedure Notes (Signed)
Procedure Name: Intubation Date/Time: 10/11/2022 3:17 PM  Performed by: Troi Bechtold, CRNAPre-anesthesia Checklist: Patient identified, Patient being monitored, Timeout performed, Emergency Drugs available and Suction available Patient Re-evaluated:Patient Re-evaluated prior to induction Oxygen Delivery Method: Circle system utilized Preoxygenation: Pre-oxygenation with 100% oxygen Induction Type: IV induction Ventilation: Mask ventilation without difficulty Laryngoscope Size: 3 and McGraph Grade View: Grade I Tube type: Oral Tube size: 8.5 mm Number of attempts: 1 Airway Equipment and Method: Stylet Placement Confirmation: ETT inserted through vocal cords under direct vision, positive ETCO2 and breath sounds checked- equal and bilateral Secured at: 27 cm Tube secured with: Tape Dental Injury: Teeth and Oropharynx as per pre-operative assessment  Comments: Taped at 27 mark at the lip, positioned verified by Pulmonologist

## 2022-10-12 DIAGNOSIS — J9601 Acute respiratory failure with hypoxia: Secondary | ICD-10-CM | POA: Diagnosis not present

## 2022-10-12 LAB — BASIC METABOLIC PANEL
Anion gap: 9 (ref 5–15)
BUN: 12 mg/dL (ref 6–20)
CO2: 27 mmol/L (ref 22–32)
Calcium: 8.8 mg/dL — ABNORMAL LOW (ref 8.9–10.3)
Chloride: 93 mmol/L — ABNORMAL LOW (ref 98–111)
Creatinine, Ser: 0.59 mg/dL — ABNORMAL LOW (ref 0.61–1.24)
GFR, Estimated: >60 mL/min (ref >60)
Glucose, Bld: 95 mg/dL (ref 70–99)
Potassium: 3.9 mmol/L (ref 3.5–5.1)
Sodium: 129 mmol/L — ABNORMAL LOW (ref 135–145)

## 2022-10-12 LAB — CBC
HCT: 35.9 % — ABNORMAL LOW (ref 39.0–52.0)
Hemoglobin: 12 g/dL — ABNORMAL LOW (ref 13.0–17.0)
MCH: 27.8 pg (ref 26.0–34.0)
MCHC: 33.4 g/dL (ref 30.0–36.0)
MCV: 83.3 fL (ref 80.0–100.0)
Platelets: 312 K/uL (ref 150–400)
RBC: 4.31 MIL/uL (ref 4.22–5.81)
RDW: 13.3 % (ref 11.5–15.5)
WBC: 4.9 K/uL (ref 4.0–10.5)
nRBC: 0 % (ref 0.0–0.2)

## 2022-10-12 LAB — BASIC METABOLIC PANEL WITH GFR
Anion gap: 11 (ref 5–15)
BUN: 12 mg/dL (ref 6–20)
CO2: 29 mmol/L (ref 22–32)
Calcium: 9 mg/dL (ref 8.9–10.3)
Chloride: 90 mmol/L — ABNORMAL LOW (ref 98–111)
Creatinine, Ser: 0.59 mg/dL — ABNORMAL LOW (ref 0.61–1.24)
GFR, Estimated: >60 mL/min
Glucose, Bld: 89 mg/dL (ref 70–99)
Potassium: 4.1 mmol/L (ref 3.5–5.1)
Sodium: 130 mmol/L — ABNORMAL LOW (ref 135–145)

## 2022-10-12 LAB — THYROID PANEL WITH TSH
Free Thyroxine Index: 2.3 (ref 1.2–4.9)
T3 Uptake Ratio: 28 % (ref 24–39)
T4, Total: 8.2 ug/dL (ref 4.5–12.0)
TSH: 1.58 u[IU]/mL (ref 0.450–4.500)

## 2022-10-12 LAB — MAGNESIUM: Magnesium: 1.9 mg/dL (ref 1.7–2.4)

## 2022-10-12 NOTE — Progress Notes (Signed)
PROGRESS NOTE  Trevor Montgomery HYQ:[id number] DOB: Oct 03, 1960 DOA: 10/10/2022 PCP: Tracie Harrier, MD  Hospital Course/Subjective: This is a 62 year old gentleman with a history of hypertension, schizoaffective disorder admitted to the hospital with complaints of cough, congestion and generalized weakness found to have community-acquired pneumonia in the setting of new lung mass.  Patient has been ill since about December 20, when he was seen by PCP treated with a course of prednisone and doxycycline after which she was feeling better, but after completing his antibiotic course started to feel worse again.  After admission to the hospital, he was given IV hydralazine for elevated blood pressure, and went into asymptomatic rapid A-fib, which resolved after dose of metoprolol.  He was admitted to the hospitalist service, and treatment was initiated with IV azithromycin and IV Rocephin.    He had a very busy past 24 hours, seen this morning sitting up in his room in a chair at the bedside wearing nasal cannula oxygen.  He is comfortable and has no complaints.  He had multiple CT scans as mentioned below, for staging.  Was also seen by radiation oncology as well as medical oncology, and underwent EBUS with lung biopsy as well.  Assessment/Plan:  Principal Problem:   Acute respiratory failure with hypoxia ([place]), due to community-acquired pneumonia Active Problems:   Cough   CAP (community acquired pneumonia)   Centrilobular emphysema ([place])   Tobacco abuse   Hyponatremia   Schizophrenia ([place])   Hypertension   Lung mass  Assessment and Plan: Acute respiratory failure with hypoxia-due to community-acquired pneumonia, left hilar lung mass which is highly suspicious of malignancy -Continue inpatient care -Continue empiric antibiotic therapy as noted below -Supplemental oxygen to keep O2 saturation above 90%, wean as tolerated -Pulmonary toilet  Community-acquired pneumonia, patient is not meeting  sepsis criteria -Continue empiric IV azithromycin and Rocephin  Lung mass-patient with left-sided pulmonary nodule, as well as hilar mass highly suspicious for malignancy -Pulmonology following, he is now status post EBUS and biopsy performed on 1/19 -He was seen by oncology and radiation oncology; radiation oncology plans palliative radiation this coming week, patient will likely also need chemotherapy -He has had CT head (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, sodium level now significantly improved -Will continue to monitor, recheck sodium level with morning labs  Hypomagnesemia-repleted as above, will recheck with morning labs  Hypertension-holding home lisinopril hydrochlorothiazide due to hyponatremia  Hyperlipidemia-continue home statin   DVT Prophylaxis: Heparin subcu  Code Status: Full code  Family Communication: Discussed with his Sister Patty at the bedside 1/20  Disposition Plan: Likely home at discharge in 4-5 days  Consultants: Pulmonary Oncology Radiation oncology  Procedures: None  Antimicrobials: Anti-infectives (From admission, onward)    Start     Dose/Rate Route Frequency Ordered Stop   10/10/22 1730  cefTRIAXone (ROCEPHIN) 2 g in sodium chloride 0.9 % 100 mL IVPB        2 g 200 mL/hr over  30 Minutes Intravenous Every 24 hours 10/10/22  1728 10/15/22 1729   10/10/22 1730  azithromycin (ZITHROMAX) 500 mg in sodium chloride 0.9 % 250 mL IVPB        500 mg 250 mL/hr over 60 Minutes Intravenous Every 24 hours 10/10/22 1728 10/15/22 1729       Objective: Vitals:   10/11/22 1730 10/11/22 2340 10/12/22 0820 10/12/22 0826  BP: 128/82 120/78 137/74   Pulse: (!) 104 96 99   Resp: 16 18 17    Temp: 98 F (36.7 C) 98.8 F (37.1 C) 98.2 F (36.8 C)   TempSrc:      SpO2: 95% 96% 92% 93%  Weight:      Height:        Intake/Output Summary (Last 24 hours) at 10/12/2022 1028 Last data filed at 10/12/2022 0342 Gross per 24 hour  Intake 800 ml  Output 875 ml  Net -75 ml    Filed Weights   10/10/22 2309 10/11/22 1435  Weight: 75 kg 75 kg   Exam: General:  Alert, oriented, calm, in no acute distress, wearing 2 L nasal cannula oxygen sitting up in chair at the bedside Eyes: EOMI, clear conjuctivae, white sclerea Neck: supple, no masses, trachea mildline  Cardiovascular: RRR, no murmurs or rubs, no peripheral edema  Respiratory: Diffuse bilateral rhonchi, good bilateral air movement, with no obvious wheezing.  No tachypnea, retractions or other evidence of respiratory distress Abdomen: soft, nontender, nondistended, normal bowel tones heard  Skin: dry, no rashes  Musculoskeletal: no joint effusions, normal range of motion  Psychiatric: appropriate affect, normal speech  Neurologic: extraocular muscles intact, clear speech, moving all extremities with intact sensorium   Data Reviewed: CBC: Recent Labs  Lab 10/10/22 1730 10/12/22 0146  WBC 8.0 4.9  HGB 13.6 12.0*  HCT 40.3 35.9*  MCV 82.2 83.3  PLT 370 312    Basic Metabolic Panel: Recent Labs  Lab 10/10/22 1730 10/10/22 2332 10/11/22 1302 10/11/22 1819 10/12/22 0146 10/12/22 0928  NA 121*  --  128* 127* 129* 130*  K 3.6  --  3.5 3.6 3.9 4.1  CL 80*  --  90* 90* 93* 90*  CO2 30  --  28 25 27 29   GLUCOSE 100*  --  82 93 95 89  BUN 8  --  10 11 12 12    CREATININE 0.47*  --  0.45* 0.50* 0.59* 0.59*  CALCIUM 9.0  --  8.9 8.6* 8.8* 9.0  MG  --  1.6*  --   --  1.9  --     GFR: Estimated Creatinine Clearance: 96.2 mL/min (A) (by C-G formula based on SCr of 0.59 mg/dL (L)). Liver Function Tests: Recent Labs  Lab 10/10/22 1730  AST 28  ALT 23  ALKPHOS 56  BILITOT 0.8  PROT 6.8  ALBUMIN 3.9    No results for input(s): "LIPASE", "AMYLASE" in the last 168 hours. No results for input(s): "AMMONIA" in the last 168 hours. Coagulation Profile: No results for input(s): "INR", "PROTIME" in the last 168 hours. Cardiac Enzymes: No results for input(s): "CKTOTAL", "CKMB", "CKMBINDEX", "TROPONINI" in the last 168 hours. BNP (last 3 results) No results for input(s): "PROBNP" in the last 8760 hours. HbA1C: No results for input(s): "HGBA1C" in the last 72 hours. CBG: No results for input(s): "GLUCAP" in the last 168 hours. Lipid Profile: No results for input(s): "CHOL", "HDL", "LDLCALC", "TRIG", "CHOLHDL", "LDLDIRECT" in the last 72 hours. Thyroid Function Tests: Recent Labs    10/10/22  1730 10/11/22 1006  TSH 1.353 1.580  T4TOTAL  --  8.2  FREET4 0.91  --     Anemia Panel: No results for input(s): "VITAMINB12", "FOLATE", "FERRITIN", "TIBC", "IRON", "RETICCTPCT" in the last 72 hours. Urine analysis: No results found for: "COLORURINE", "APPEARANCEUR", "LABSPEC", "PHURINE", "GLUCOSEU", "HGBUR", "BILIRUBINUR", "KETONESUR", "PROTEINUR", "UROBILINOGEN", "NITRITE", "LEUKOCYTESUR" Sepsis Labs: @LABRCNTIP (procalcitonin:4,lacticidven:4)  ) Recent Results (from the past 240 hour(s))  Resp panel by RT-PCR (RSV, Flu A&B, Covid) Anterior Nasal Swab     Status: None   Collection Time: 10/10/22  5:30 PM   Specimen: Anterior Nasal Swab  Result Value Ref Range Status   SARS Coronavirus 2 by RT PCR NEGATIVE NEGATIVE Final    Comment: (NOTE) SARS-CoV-2 target nucleic acids are NOT DETECTED.  The SARS-CoV-2 RNA is generally detectable in upper  respiratory specimens during the acute phase of infection. The lowest concentration of SARS-CoV-2 viral copies this assay can detect is 138 copies/mL. A negative result does not preclude SARS-Cov-2 infection and should not be used as the sole basis for treatment or other patient management decisions. A negative result may occur with  improper specimen collection/handling, submission of specimen other than nasopharyngeal swab, presence of viral mutation(s) within the areas targeted by this assay, and inadequate number of viral copies(<138 copies/mL). A negative result must be combined with clinical observations, patient history, and epidemiological information. The expected result is Negative.  Fact Sheet for Patients:  EntrepreneurPulse.com.au  Fact Sheet for Healthcare Providers:  IncredibleEmployment.be  This test is no t yet approved or cleared by the [place] FDA and  has been authorized for detection and/or diagnosis of SARS-CoV-2 by FDA under an Emergency Use Authorization (EUA). This EUA will remain  in effect (meaning this test can be used) for the duration of the COVID-19 declaration under Section 564(b)(1) of the Act, 21 U.S.C.section 360bbb-3(b)(1), unless the authorization is terminated  or revoked sooner.       Influenza A by PCR NEGATIVE NEGATIVE Final   Influenza B by PCR NEGATIVE NEGATIVE Final    Comment: (NOTE) The Xpert Xpress SARS-CoV-2/FLU/RSV plus assay is intended as an aid in the diagnosis of influenza from Nasopharyngeal swab specimens and should not be used as a sole basis for treatment. Nasal washings and aspirates are unacceptable for Xpert Xpress SARS-CoV-2/FLU/RSV testing.  Fact Sheet for Patients: EntrepreneurPulse.com.au  Fact Sheet for Healthcare Providers: IncredibleEmployment.be  This test is not yet approved or cleared by the [place] FDA and has been  authorized for detection and/or diagnosis of SARS-CoV-2 by FDA under an Emergency Use Authorization (EUA). This EUA will remain in effect (meaning this test can be used) for the duration of the COVID-19 declaration under Section 564(b)(1) of the Act, 21 U.S.C. section 360bbb-3(b)(1), unless the authorization is terminated or revoked.     Resp Syncytial Virus by PCR NEGATIVE NEGATIVE Final    Comment: (NOTE) Fact Sheet for Patients: EntrepreneurPulse.com.au  Fact Sheet for Healthcare Providers: IncredibleEmployment.be  This test is not yet approved or cleared by the [place] FDA and has been authorized for detection and/or diagnosis of SARS-CoV-2 by FDA under an Emergency Use Authorization (EUA). This EUA will remain in effect (meaning this test can be used) for the duration of the COVID-19 declaration under Section 564(b)(1) of the Act, 21 U.S.C. section 360bbb-3(b)(1), unless the authorization is terminated or revoked.  Performed at [facility], [address] [place], [place] [address]   Culture, Respiratory w Gram Stain     Status:  None (Preliminary result)   Collection Time: 10/11/22  4:59 PM   Specimen: Stump; Respiratory  Result Value Ref Range Status   Specimen Description   Final    STUMP Performed at [facility], [address] [place], [place] [address]    Special Requests   Final    NONE Performed at [facility], [address] [place], [place] [address]    Gram Stain   Final    MODERATE WBC PRESENT, PREDOMINANTLY PMN NO ORGANISMS SEEN    Culture   Final    NO GROWTH < 12 HOURS Performed at [facility], [address] [place], [place] [address]    Report Status PENDING  Incomplete     Studies: CT HEAD W & WO CONTRAST ( )  Result Date: 10/11/2022 CLINICAL DATA:  Metastatic disease evaluation EXAM: CT HEAD WITHOUT AND WITH CONTRAST TECHNIQUE: Contiguous axial images were  obtained from the base of the skull through the vertex without and with intravenous contrast. RADIATION DOSE REDUCTION: This exam was performed according to the departmental dose-optimization program which includes automated exposure control, adjustment of the mA and/or kV according to patient size and/or use of iterative reconstruction technique. CONTRAST:  20mL OMNIPAQUE IOHEXOL 350 MG/ML SOLN COMPARISON:  None Available. FINDINGS: Brain: There is no mass, hemorrhage or extra-axial collection. The size and configuration of the ventricles and extra-axial CSF spaces are normal. The brain parenchyma is normal, without acute or chronic infarction. No abnormal enhancement. Vascular: No abnormal hyperdensity of the major intracranial arteries or dural venous sinuses. No intracranial atherosclerosis. Skull: The visualized skull base, calvarium and extracranial soft tissues are normal. Sinuses/Orbits: No fluid levels or advanced mucosal thickening of the visualized paranasal sinuses. No mastoid or middle ear effusion. The orbits are normal. IMPRESSION: Normal head CT. Electronically Signed   By: Deatra Robinson M.D.   On: 10/11/2022 23:08   CT ABDOMEN PELVIS W CONTRAST  Result Date: 10/11/2022 CLINICAL DATA:  Metastatic disease evaluation EXAM: CT ABDOMEN AND PELVIS WITH CONTRAST TECHNIQUE: Multidetector CT imaging of the abdomen and pelvis was performed using the standard protocol following bolus administration of intravenous contrast. RADIATION DOSE REDUCTION: This exam was performed according to the departmental dose-optimization program which includes automated exposure control, adjustment of the mA and/or kV according to patient size and/or use of iterative reconstruction technique. CONTRAST:  51mL OMNIPAQUE IOHEXOL 350 MG/ML SOLN COMPARISON:  CT angiogram chest 10/11/2022 FINDINGS: Lower chest: There is atelectasis in the lung bases. Left lower lobe mass is partially imaged. Please see dedicated CT chest performed  same day. Hepatobiliary: There are 3 hypodensities in the left lobe of the liver measuring up to 15 mm, indeterminate given chest findings. There is no biliary ductal dilatation. The gallbladder is within normal limits. Pancreas: Unremarkable. No pancreatic ductal dilatation or surrounding inflammatory changes. Spleen: Normal in size without focal abnormality. Adrenals/Urinary Tract: Bilateral renal cortical cysts are present measuring up to 3.5 cm. There is no hydronephrosis or perinephric fat stranding. The adrenal glands and bladder are within normal limits. The bladder is distended. Stomach/Bowel: There is a small hiatal hernia. Stomach is otherwise within normal limits. Appendix is not seen. No evidence of bowel wall thickening, distention, or inflammatory changes. There is sigmoid colon diverticulosis. Vascular/Lymphatic: No significant vascular findings are present. No enlarged abdominal or pelvic lymph nodes. Reproductive: Prostate gland is mildly enlarged. Other: No abdominal wall hernia or abnormality. No abdominopelvic ascites. Musculoskeletal: No acute or significant osseous findings. IMPRESSION: 1. Three  hypodense lesions in the left lobe of the liver measuring up to 15 mm, indeterminate. These may represent cysts or hemangiomas, but other etiologies such as metastatic disease are not excluded given findings in the chest. Consider further evaluation with MRI. 2. No other evidence for metastatic disease in the abdomen or pelvis. 3. Small hiatal hernia. 4. Sigmoid colon diverticulosis. 5. Bilateral renal cysts.  No follow-up necessary. Electronically Signed   By: Darliss Cheney M.D.   On: 10/11/2022 21:37   DG Chest Port 1 View  Result Date: 10/11/2022 CLINICAL DATA:  Post bronchoscopy with biopsy EXAM: PORTABLE CHEST 1 VIEW COMPARISON:  Portable exam 1704 hours compared to CT chest 10/11/2022 FINDINGS: Normal heart size and pulmonary vascularity. Again identified LEFT hilar enlargement/adenopathy and  LEFT perihilar mass. Minimal infiltrate in LEFT lung adjacent to mass. Remaining lungs clear. No pleural effusion or pneumothorax. IMPRESSION: No pneumothorax following bronchoscopy. LEFT hilar adenopathy and LEFT perihilar mass again identified with mild LEFT perihilar edema postprocedure. Electronically Signed   By: Ulyses Southward M.D.   On: 10/11/2022 17:23   ECHOCARDIOGRAM COMPLETE  Result Date: 10/11/2022    ECHOCARDIOGRAM REPORT   Patient Name:   [facility] Date of Exam: 10/11/2022 Medical Rec #:  [id number]  Height:       68.0 in Accession #:    [id number] Weight:       165.3 lb Date of Birth:  [birth year]   BSA:          1.885 m Patient Age:    61 years   BP:           134/80 mmHg Patient Gender: M          HR:           87 bpm. Exam Location:  [facility] Procedure: 2D Echo, Cardiac Doppler and Color Doppler Indications:     Atrial Fibrillation I48.91  History:         Patient has no prior history of Echocardiogram examinations.                  Risk Factors:Hypertension and Dyslipidemia.  Sonographer:     Cristela Blue Referring Phys:  [id number] Ruta Capece MOHAMMED [facility] Diagnosing Phys: Julien Nordmann MD  Sonographer Comments: Image quality was fair. IMPRESSIONS  1. Left ventricular ejection fraction, by estimation, is 60 to 65%. The left ventricle has normal function. The left ventricle has no regional wall motion abnormalities. Left ventricular diastolic parameters are consistent with Grade I diastolic dysfunction (impaired relaxation).  2. Right ventricular systolic function is normal. The right ventricular size is normal.  3. The mitral valve is normal in structure. No evidence of mitral valve regurgitation. No evidence of mitral stenosis.  4. The aortic valve is normal in structure. Aortic valve regurgitation is not visualized. No aortic stenosis is present.  5. There is borderline dilatation of the aortic root, measuring 37 mm.  6. The inferior vena cava is normal in size with greater than 50% respiratory  variability, suggesting right atrial pressure of 3 mmHg. FINDINGS  Left Ventricle: Left ventricular ejection fraction, by estimation, is 60 to 65%. The left ventricle has normal function. The left ventricle has no regional wall motion abnormalities. The left ventricular internal cavity size was normal in size. There is  no left ventricular hypertrophy. Left ventricular diastolic parameters are consistent with Grade I diastolic dysfunction (impaired relaxation). Right Ventricle: The right ventricular size is normal. No increase in right ventricular wall thickness. Right ventricular systolic  function is normal. Left Atrium: Left atrial size was normal in size. Right Atrium: Right atrial size was normal in size. Pericardium: There is no evidence of pericardial effusion. Mitral Valve: The mitral valve is normal in structure. No evidence of mitral valve regurgitation. No evidence of mitral valve stenosis. Tricuspid Valve: The tricuspid valve is normal in structure. Tricuspid valve regurgitation is mild . No evidence of tricuspid stenosis. Aortic Valve: The aortic valve is normal in structure. Aortic valve regurgitation is not visualized. No aortic stenosis is present. Aortic valve mean gradient measures 4.0 mmHg. Aortic valve peak gradient measures 6.2 mmHg. Aortic valve area, by VTI measures 3.33 cm. Pulmonic Valve: The pulmonic valve was normal in structure. Pulmonic valve regurgitation is not visualized. No evidence of pulmonic stenosis. Aorta: The aortic root is normal in size and structure. There is borderline dilatation of the aortic root, measuring 37 mm. Venous: The inferior vena cava is normal in size with greater than 50% respiratory variability, suggesting right atrial pressure of 3 mmHg. IAS/Shunts: No atrial level shunt detected by color flow Doppler.  LEFT VENTRICLE PLAX 2D LVIDd:         3.70 cm   Diastology LVIDs:         2.50 cm   LV e' medial:    9.14 cm/s LV PW:         1.10 cm   LV E/e' medial:  9.5  LV IVS:        1.00 cm   LV e' lateral:   8.49 cm/s LVOT diam:     2.00 cm   LV E/e' lateral: 10.2 LV SV:         84 LV SV Index:   45 LVOT Area:     3.14 cm  RIGHT VENTRICLE RV Basal diam:  3.90 cm RV Mid diam:    4.10 cm RV S prime:     16.50 cm/s TAPSE (M-mode): 2.7 cm LEFT ATRIUM           Index        RIGHT ATRIUM           Index LA diam:      2.20 cm 1.17 cm/m   RA Area:     11.50 cm LA Vol (A2C): 63.7 ml 33.79 ml/m  RA Volume:   24.40 ml  12.94 ml/m LA Vol (A4C): 25.5 ml 13.53 ml/m  AORTIC VALVE AV Area (Vmax):    3.44 cm AV Area (Vmean):   3.40 cm AV Area (VTI):     3.33 cm AV Vmax:           125.00 cm/s AV Vmean:          95.200 cm/s AV VTI:            0.253 m AV Peak Grad:      6.2 mmHg AV Mean Grad:      4.0 mmHg LVOT Vmax:         137.00 cm/s LVOT Vmean:        103.000 cm/s LVOT VTI:          0.268 m LVOT/AV VTI ratio: 1.06  AORTA Ao Root diam: 3.70 cm MITRAL VALVE               TRICUSPID VALVE MV Area (PHT): 5.23 cm    TR Peak grad:   19.9 mmHg MV Decel Time: 145 msec    TR Vmax:        223.00 cm/s  MV E velocity: 86.50 cm/s MV A velocity: 91.70 cm/s  SHUNTS MV E/A ratio:  0.94        Systemic VTI:  0.27 m                            Systemic Diam: 2.00 cm Julien Nordmann MD Electronically signed by Julien Nordmann MD Signature Date/Time: 10/11/2022/5:11:44 PM    Final     Scheduled Meds:  docusate sodium  100 mg Oral BID   ipratropium-albuterol  3 mL Nebulization QID   tranexamic acid  1,000 mg Topical Once    Continuous Infusions:  sodium chloride 75 mL/hr at 10/12/22 0927   azithromycin Stopped (10/10/22 1940)   cefTRIAXone (ROCEPHIN)  IV Stopped (10/10/22 1842)     LOS: 2 days   Time spent: 31 minutes  Latrelle Bazar Vergie Living, MD [facility] Pager [phone]  If 7PM-7AM, please contact night-coverage [email or web address] Password [facility] 10/12/2022, 10:28 AM

## 2022-10-13 DIAGNOSIS — J9601 Acute respiratory failure with hypoxia: Secondary | ICD-10-CM | POA: Diagnosis not present

## 2022-10-13 LAB — CBC
HCT: 35.9 % — ABNORMAL LOW (ref 39.0–52.0)
Hemoglobin: 11.8 g/dL — ABNORMAL LOW (ref 13.0–17.0)
MCH: 27.6 pg (ref 26.0–34.0)
MCHC: 32.9 g/dL (ref 30.0–36.0)
MCV: 83.9 fL (ref 80.0–100.0)
Platelets: 332 10*3/uL (ref 150–400)
RBC: 4.28 MIL/uL (ref 4.22–5.81)
RDW: 13.7 % (ref 11.5–15.5)
WBC: 10 10*3/uL (ref 4.0–10.5)
nRBC: 0 % (ref 0.0–0.2)

## 2022-10-13 LAB — BASIC METABOLIC PANEL
Anion gap: 7 (ref 5–15)
BUN: 12 mg/dL (ref 6–20)
CO2: 28 mmol/L (ref 22–32)
Calcium: 8.2 mg/dL — ABNORMAL LOW (ref 8.9–10.3)
Chloride: 95 mmol/L — ABNORMAL LOW (ref 98–111)
Creatinine, Ser: 0.5 mg/dL — ABNORMAL LOW (ref 0.61–1.24)
GFR, Estimated: >60 mL/min (ref >60)
Glucose, Bld: 87 mg/dL (ref 70–99)
Potassium: 3.5 mmol/L (ref 3.5–5.1)
Sodium: 130 mmol/L — ABNORMAL LOW (ref 135–145)

## 2022-10-13 LAB — OSMOLALITY, URINE: Osmolality, Ur: 530 mOsm/kg (ref 300–900)

## 2022-10-13 LAB — SODIUM, URINE, RANDOM: Sodium, Ur: 17 mmol/L

## 2022-10-13 MED ORDER — DOCUSATE SODIUM 100 MG PO CAPS: 100.0000 mg | ORAL_CAPSULE | Freq: Two times a day (BID) | ORAL | Status: DC | PRN

## 2022-10-13 MED ORDER — AZITHROMYCIN 500 MG PO TABS
500.0000 mg | ORAL_TABLET | Freq: Every day | ORAL | Status: AC
  Administered 2022-10-13 – 2022-10-14 (×2): 500 mg via ORAL
  Filled 2022-10-13 (×2): qty 1

## 2022-10-13 NOTE — Progress Notes (Signed)
PROGRESS NOTE  Cuyler Vandyken ZOX:[id number] DOB: 28-Dec-1960 DOA: 10/10/2022 PCP: Tracie Harrier, MD  Hospital Course/Subjective: This is a 62 year old gentleman with a history of hypertension, schizoaffective disorder admitted to the hospital with complaints of cough, congestion and generalized weakness found to have community-acquired pneumonia in the setting of new lung mass.  Patient has been ill since about December 20, when he was seen by PCP treated with a course of prednisone and doxycycline after which she was feeling better, but after completing his antibiotic course started to feel worse again.  After admission to the hospital, he was given IV hydralazine for elevated blood pressure, and went into asymptomatic rapid A-fib, which resolved after dose of metoprolol.  He was admitted to the hospitalist service, and treatment was initiated with IV azithromycin and IV Rocephin.    Resting in his room this morning, he is glad to hear that he will be starting radiation for his lung mass in the coming days.  His main concern this morning is whether he can stop taking stool softeners.  Assessment/Plan:  Principal Problem:   Acute respiratory failure with hypoxia ([place]), due to community-acquired pneumonia Active Problems:   Cough   CAP (community acquired pneumonia)   Centrilobular emphysema ([place])   Tobacco abuse   Hyponatremia   Schizophrenia ([place])   Hypertension   Lung mass  Assessment and Plan: Acute respiratory failure with hypoxia-due to community-acquired pneumonia, left hilar lung mass which is highly suspicious of malignancy -Continue inpatient care -Continue empiric antibiotic therapy as noted below -Supplemental oxygen to keep O2 saturation above 90%, wean as tolerated -Pulmonary toilet  Community-acquired pneumonia, patient is not meeting sepsis criteria -Continue empiric IV azithromycin and Rocephin for total 7 days  Lung mass-patient with left-sided pulmonary nodule, as  well as hilar mass highly suspicious for malignancy -Pulmonology following, he is now status post EBUS and biopsy performed on 1/19 -He was seen by oncology and radiation oncology; radiation oncology plans palliative radiation this coming week, patient will likely also need chemotherapy -He has had CT head (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, sodium level now significantly improved -Discontinue normal saline infusion -Will continue to monitor, recheck sodium level with morning labs  Hypomagnesemia-repleted as above, will recheck with morning labs  Hypertension-holding home lisinopril hydrochlorothiazide due to hyponatremia  Hyperlipidemia-continue home statin   DVT Prophylaxis: Heparin subcu  Code Status: Full code  Family Communication: Discussed with his Sister Patty at the bedside 1/20  Disposition Plan: Likely home at discharge in 4-5 days  Consultants: Pulmonary Oncology Radiation oncology  Procedures: None  Antimicrobials: Anti-infectives (From admission, onward)    Start     Dose/Rate Route Frequency Ordered Stop   10/13/22 1800  azithromycin (ZITHROMAX) tablet 500 mg        500 mg Oral Daily 10/13/22 0740 10/15/22 1759   10/10/22 1730  cefTRIAXone (ROCEPHIN) 2 g in sodium chloride 0.9 % 100 mL IVPB  2 g 200  mL/hr over 30 Minutes Intravenous Every 24 hours 10/10/22 1728 10/15/22 1729   10/10/22 1730  azithromycin (ZITHROMAX) 500 mg in sodium chloride 0.9 % 250 mL IVPB  Status:  Discontinued        500 mg 250 mL/hr over 60 Minutes Intravenous Every 24 hours 10/10/22 1728 10/13/22 0740       Objective: Vitals:   10/12/22 2052 10/12/22 2258 10/13/22 0831 10/13/22 0848  BP:  133/72  (!) 161/83  Pulse:  89  (!) 102  Resp:  20  18  Temp:  97.7 F (36.5 C)    TempSrc:      SpO2: 95% 97% 96% 96%  Weight:      Height:        Intake/Output Summary (Last 24 hours) at 10/13/2022 1114 Last data filed at 10/13/2022 0906 Gross per 24 hour  Intake 3483.42 ml  Output 1150 ml  Net 2333.42 ml    Filed Weights   10/10/22 2309 10/11/22 1435  Weight: 75 kg 75 kg   Exam: General:  Alert, oriented, calm, in no acute distress looks comfortable, on 2 L Eyes: EOMI, clear conjuctivae, white sclerea Neck: supple, no masses, trachea mildline  Cardiovascular: RRR, no murmurs or rubs, no peripheral edema  Respiratory: clear to auscultation bilaterally, no wheezes, no crackles  Abdomen: soft, nontender, nondistended, normal bowel tones heard  Skin: dry, no rashes  Musculoskeletal: no joint effusions, normal range of motion  Psychiatric: appropriate affect, normal speech  Neurologic: extraocular muscles intact, clear speech, moving all extremities with intact sensorium  Data Reviewed: CBC: Recent Labs  Lab 10/10/22 1730 10/12/22 0146 10/13/22 0525  WBC 8.0 4.9 10.0  HGB 13.6 12.0* 11.8*  HCT 40.3 35.9* 35.9*  MCV 82.2 83.3 83.9  PLT 370 312 332    Basic Metabolic Panel: Recent Labs  Lab 10/10/22 2332 10/11/22 1302 10/11/22 1819 10/12/22 0146 10/12/22 0928 10/13/22 0525  NA  --  128* 127* 129* 130* 130*  K  --  3.5 3.6 3.9 4.1 3.5  CL  --  90* 90* 93* 90* 95*  CO2  --  28 25 27 29 28   GLUCOSE  --  82 93 95 89 87  BUN  --  10 11 12 12 12   CREATININE  --  0.45* 0.50* 0.59* 0.59* 0.50*   CALCIUM  --  8.9 8.6* 8.8* 9.0 8.2*  MG 1.6*  --   --  1.9  --   --     GFR: Estimated Creatinine Clearance: 96.2 mL/min (A) (by C-G formula based on SCr of 0.5 mg/dL (L)). Liver Function Tests: Recent Labs  Lab 10/10/22 1730  AST 28  ALT 23  ALKPHOS 56  BILITOT 0.8  PROT 6.8  ALBUMIN 3.9    No results for input(s): "LIPASE", "AMYLASE" in the last 168 hours. No results for input(s): "AMMONIA" in the last 168 hours. Coagulation Profile: No results for input(s): "INR", "PROTIME" in the last 168 hours. Cardiac Enzymes: No results for input(s): "CKTOTAL", "CKMB", "CKMBINDEX", "TROPONINI" in the last 168 hours. BNP (last 3 results) No results for input(s): "PROBNP" in the last 8760 hours. HbA1C: No results for input(s): "HGBA1C" in the last 72 hours. CBG: No results for input(s): "GLUCAP" in the last 168 hours. Lipid Profile: No results for input(s): "CHOL", "HDL", "LDLCALC", "TRIG", "CHOLHDL", "LDLDIRECT" in the last 72 hours. Thyroid Function Tests: Recent Labs    10/10/22 1730 10/11/22 1006  TSH 1.353 1.580  T4TOTAL  --  8.2  FREET4 0.91  --     Anemia Panel: No results for input(s): "VITAMINB12", "FOLATE", "FERRITIN", "TIBC", "IRON", "RETICCTPCT" in the last 72 hours. Urine analysis: No results found for: "COLORURINE", "APPEARANCEUR", "LABSPEC", "PHURINE", "GLUCOSEU", "HGBUR", "BILIRUBINUR", "KETONESUR", "PROTEINUR", "UROBILINOGEN", "NITRITE", "LEUKOCYTESUR" Sepsis Labs: @LABRCNTIP (procalcitonin:4,lacticidven:4)  ) Recent Results (from the past 240 hour(s))  Resp panel by RT-PCR (RSV, Flu A&B, Covid) Anterior Nasal Swab     Status: None   Collection Time: 10/10/22  5:30 PM   Specimen: Anterior Nasal Swab  Result Value Ref Range Status   SARS Coronavirus 2 by RT PCR NEGATIVE NEGATIVE Final    Comment: (NOTE) SARS-CoV-2 target nucleic acids are NOT DETECTED.  The SARS-CoV-2 RNA is generally detectable in upper respiratory specimens during the acute phase of  infection. The lowest concentration of SARS-CoV-2 viral copies this assay can detect is 138 copies/mL. A negative result does not preclude SARS-Cov-2 infection and should not be used as the sole basis for treatment or other patient management decisions. A negative result may occur with  improper specimen collection/handling, submission of specimen other than nasopharyngeal swab, presence of viral mutation(s) within the areas targeted by this assay, and inadequate number of viral copies(<138 copies/mL). A negative result must be combined with clinical observations, patient history, and epidemiological information. The expected result is Negative.  Fact Sheet for Patients:  EntrepreneurPulse.com.au  Fact Sheet for Healthcare Providers:  IncredibleEmployment.be  This test is no t yet approved or cleared by the [place] FDA and  has been authorized for detection and/or diagnosis of SARS-CoV-2 by FDA under an Emergency Use Authorization (EUA). This EUA will remain  in effect (meaning this test can be used) for the duration of the COVID-19 declaration under Section 564(b)(1) of the Act, 21 U.S.C.section 360bbb-3(b)(1), unless the authorization is terminated  or revoked sooner.       Influenza A by PCR NEGATIVE NEGATIVE Final   Influenza B by PCR NEGATIVE NEGATIVE Final    Comment: (NOTE) The Xpert Xpress SARS-CoV-2/FLU/RSV plus assay is intended as an aid in the diagnosis of influenza from Nasopharyngeal swab specimens and should not be used as a sole basis for treatment. Nasal washings and aspirates are unacceptable for Xpert Xpress SARS-CoV-2/FLU/RSV testing.  Fact Sheet for Patients: EntrepreneurPulse.com.au  Fact Sheet for Healthcare Providers: IncredibleEmployment.be  This test is not yet approved or cleared by the [place] FDA and has been authorized for detection and/or diagnosis of SARS-CoV-2  by FDA under an Emergency Use Authorization (EUA). This EUA will remain in effect (meaning this test can be used) for the duration of the COVID-19 declaration under Section 564(b)(1) of the Act, 21 U.S.C. section 360bbb-3(b)(1), unless the authorization is terminated or revoked.     Resp Syncytial Virus by PCR NEGATIVE NEGATIVE Final    Comment: (NOTE) Fact Sheet for Patients: EntrepreneurPulse.com.au  Fact Sheet for Healthcare Providers: IncredibleEmployment.be  This test is not yet approved or cleared by the [place] FDA and has been authorized for detection and/or diagnosis of SARS-CoV-2 by FDA under an Emergency Use Authorization (EUA). This EUA will remain in effect (meaning this test can be used) for the duration of the COVID-19 declaration under Section 564(b)(1) of the Act, 21 U.S.C. section 360bbb-3(b)(1), unless the authorization is terminated or revoked.  Performed at [facility], [address] [place], [place] [address]   Culture, Respiratory w Gram Stain     Status: None (Preliminary result)   Collection Time: 10/11/22  4:59 PM  Specimen: Stump; Respiratory  Result Value Ref Range Status   Specimen Description   Final    STUMP Performed at [facility], [address] [place], [place] [address]    Special Requests   Final    NONE Performed at [facility] [address] [place], [place] [address]    Gram Stain   Final    MODERATE WBC PRESENT, PREDOMINANTLY PMN NO ORGANISMS SEEN    Culture   Final    CULTURE REINCUBATED FOR BETTER GROWTH Performed at [facility], [address], [place], [place] [address]    Report Status PENDING  Incomplete     Studies: No results found.  Scheduled Meds:  azithromycin  500 mg Oral Daily   ipratropium-albuterol  3 mL Nebulization QID   tranexamic acid  1,000 mg Topical Once    Continuous Infusions:  sodium chloride 75 mL/hr at  10/13/22 0328   cefTRIAXone (ROCEPHIN)  IV 2 g (10/12/22 1652)     LOS: 3 days   Time spent: 31 minutes  Oswaldo Cueto Vergie Living, MD [facility] Pager [phone]  If 7PM-7AM, please contact night-coverage [email or web address] Password [facility] 10/13/2022, 11:14 AM

## 2022-10-13 NOTE — TOC Progression Note (Signed)
Transition of Care [facility]) - Progression Note    Patient Details  Name: Trevor Montgomery MRN: [id number] Date of Birth: February 23, 1961  Transition of Care [facility]) CM/SW Contact  Bing Quarry, RN Phone Number: 10/13/2022, 4:30 PM  Clinical Narrative:  1/21: HTX from notes: Came in with dx of PNA and a pulmonary mass discovered per notes. Provider note: EDD 4-5 days. 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 RN CM spoke with patient about discharge planning. Patient states he may stay for radiation treatments. IV Abx. No preference for [facility] but wants to wait to see is he stays for radiation. Acute oxygen at 2L/[name] today. [name] RN CM     Expected Discharge Plan: Home/Self Care Barriers to Discharge: No Barriers Identified  Expected Discharge Plan and Services   Discharge Planning Services: CM Consult   Living arrangements for the past 2 months: Single Family Home                                       Social Determinants of Health (SDOH) Interventions SDOH Screenings   Food Insecurity: No Food Insecurity (10/10/2022)  Housing: Low Risk  (10/10/2022)  Transportation Needs: No Transportation Needs (10/10/2022)  Utilities: Not At Risk (10/10/2022)  Tobacco Use: High Risk (10/11/2022)    Readmission Risk Interventions     No data to display

## 2022-10-13 NOTE — Plan of Care (Signed)

## 2022-10-13 NOTE — Progress Notes (Signed)
PHARMACIST - PHYSICIAN COMMUNICATION  CONCERNING: Antibiotic IV to Oral Route Change Policy  RECOMMENDATION: This patient is receiving azithromycin by the intravenous route.  Based on criteria approved by the Pharmacy and Therapeutics Committee, the antibiotic(s) is/are being converted to the equivalent oral dose form(s).  DESCRIPTION: These criteria include: Patient being treated for a respiratory tract infection, urinary tract infection, cellulitis or clostridium difficile associated diarrhea if on metronidazole The patient is not neutropenic and does not exhibit a GI malabsorption state The patient is eating (either orally or via tube) and/or has been taking other orally administered medications for a least 24 hours The patient is improving clinically and has a Tmax < 100.5  If you have questions about this conversion, please contact the Pharmacy Department   Tressie Ellis 10/13/22

## 2022-10-14 ENCOUNTER — Ambulatory Visit: Payer: Medicare Other

## 2022-10-14 ENCOUNTER — Encounter: Payer: Self-pay | Admitting: Student in an Organized Health Care Education/Training Program

## 2022-10-14 DIAGNOSIS — F17200 Nicotine dependence, unspecified, uncomplicated: Secondary | ICD-10-CM | POA: Insufficient documentation

## 2022-10-14 DIAGNOSIS — J9601 Acute respiratory failure with hypoxia: Secondary | ICD-10-CM | POA: Diagnosis not present

## 2022-10-14 DIAGNOSIS — C3492 Malignant neoplasm of unspecified part of left bronchus or lung: Secondary | ICD-10-CM | POA: Insufficient documentation

## 2022-10-14 DIAGNOSIS — Z510 Encounter for antineoplastic radiation therapy: Secondary | ICD-10-CM | POA: Insufficient documentation

## 2022-10-14 LAB — CULTURE, RESPIRATORY W GRAM STAIN: Culture: NORMAL

## 2022-10-14 LAB — BASIC METABOLIC PANEL
Anion gap: 8 (ref 5–15)
BUN: 6 mg/dL (ref 6–20)
CO2: 30 mmol/L (ref 22–32)
Calcium: 8.2 mg/dL — ABNORMAL LOW (ref 8.9–10.3)
Chloride: 92 mmol/L — ABNORMAL LOW (ref 98–111)
Creatinine, Ser: 0.38 mg/dL — ABNORMAL LOW (ref 0.61–1.24)
GFR, Estimated: >60 mL/min (ref >60)
Glucose, Bld: 81 mg/dL (ref 70–99)
Potassium: 3.5 mmol/L (ref 3.5–5.1)
Sodium: 130 mmol/L — ABNORMAL LOW (ref 135–145)

## 2022-10-14 LAB — CBC
HCT: 34.1 % — ABNORMAL LOW (ref 39.0–52.0)
Hemoglobin: 11.2 g/dL — ABNORMAL LOW (ref 13.0–17.0)
MCH: 27.7 pg (ref 26.0–34.0)
MCHC: 32.8 g/dL (ref 30.0–36.0)
MCV: 84.2 fL (ref 80.0–100.0)
Platelets: 303 10*3/uL (ref 150–400)
RBC: 4.05 MIL/uL — ABNORMAL LOW (ref 4.22–5.81)
RDW: 13.9 % (ref 11.5–15.5)
WBC: 8.1 10*3/uL (ref 4.0–10.5)
nRBC: 0 % (ref 0.0–0.2)

## 2022-10-14 MED ORDER — IPRATROPIUM-ALBUTEROL 0.5-2.5 (3) MG/3ML IN SOLN
3.0000 mL | Freq: Three times a day (TID) | RESPIRATORY_TRACT | Status: DC
  Administered 2022-10-14 – 2022-10-16 (×4): 3 mL via RESPIRATORY_TRACT
  Filled 2022-10-14 (×5): qty 3

## 2022-10-14 NOTE — Progress Notes (Signed)
Occupational Therapy Treatment Patient Details Name: Trevor Montgomery MRN: [id number] DOB: 05-21-1961 Today's Date: 10/14/2022   History of present illness Pt is a 62 year old M with a history of hypertension, schizoaffective disorder admitted to the hospital with complaints of cough, congestion and generalized weakness found to have community-acquired pneumonia in the setting of new lung mass.  Patient has been ill since about December 20, when he was seen by PCP treated with a course of prednisone and doxycycline after which she was feeling better, but after completing his antibiotic course started to feel worse again. MD assessment includes: Acute respiratory failure with hypoxia due to community-acquired pneumonia, left hilar lung mass of unclear etiology, and hyponatremia.   OT comments  Upon entering the room, pt supine in bed with sister present in room. Pt is agreeable to OT intervention. Pt on 2Ls initially and attempt to wean O2 and placed on RA during session. Pt stands from bed without assistance and pt requests to wash hands at sink. Pt ambulates in room with supervision without use of AD to sink and stands to wash hands with cuing to locate needed items. Pt then ambulates to sit in recliner chair with MD present in room. All needs within reach and pt appears to be feeling better as he is laughing and joking with therapist in room during session. Continue to recommend HHOT at hospital discharge.    Recommendations for follow up therapy are one component of a multi-disciplinary discharge planning process, led by the attending physician.  Recommendations may be updated based on patient status, additional functional criteria and insurance authorization.    Follow Up Recommendations  Home health OT     Assistance Recommended at Discharge Intermittent Supervision/Assistance  Patient can return home with the following  A little help with walking and/or transfers;A little help with  bathing/dressing/bathroom;Help with stairs or ramp for entrance;Assist for transportation;Assistance with cooking/housework   Equipment Recommendations  None recommended by OT       Precautions / Restrictions Precautions Precautions: Fall Restrictions Weight Bearing Restrictions: No       Mobility Bed Mobility Overal bed mobility: Modified Independent             General bed mobility comments: HOB elevated    Transfers Overall transfer level: Needs assistance Equipment used: None Transfers: Sit to/from Stand Sit to Stand: Supervision                 Balance Overall balance assessment: Needs assistance, History of Falls Sitting-balance support: Feet supported Sitting balance-Leahy Scale: Good     Standing balance support: No upper extremity supported Standing balance-Leahy Scale: Fair                             ADL either performed or assessed with clinical judgement   ADL Overall ADL's : Needs assistance/impaired     Grooming: Standing;Supervision/safety                                      Extremity/Trunk Assessment Upper Extremity Assessment Upper Extremity Assessment: Generalized weakness   Lower Extremity Assessment Lower Extremity Assessment: Generalized weakness        Vision Patient Visual Report: No change from baseline            Cognition Arousal/Alertness: Awake/alert Behavior During Therapy: WFL for tasks assessed/performed Overall Cognitive Status: Within Functional Limits  for tasks assessed                                 General Comments: Pt is pleasant and cooperative and making jokes in room with sister present.                   Pertinent Vitals/ Pain       Pain Assessment Pain Assessment: No/denies pain         Frequency  Min 2X/week        Progress Toward Goals  OT Goals(current goals can now be found in the care plan section)  Progress towards OT goals:  Progressing toward goals  Acute Rehab OT Goals Patient Stated Goal: to go home and feel better OT Goal Formulation: With patient Time For Goal Achievement: 10/25/22 Potential to Achieve Goals: Good  Plan Discharge plan remains appropriate;Frequency remains appropriate       AM-PAC OT "6 Clicks" Daily Activity     Outcome Measure   Help from another person eating meals?: None Help from another person taking care of personal grooming?: None Help from another person toileting, which includes using toliet, bedpan, or urinal?: None Help from another person bathing (including washing, rinsing, drying)?: A Little Help from another person to put on and taking off regular upper body clothing?: None Help from another person to put on and taking off regular lower body clothing?: A Little 6 Click Score: 22    End of Session    OT Visit Diagnosis: Unsteadiness on feet (R26.81);Repeated falls (R29.6);Muscle weakness (generalized) (M62.81)   Activity Tolerance Patient tolerated treatment well   Patient Left with call bell/phone within reach;in chair;with family/visitor present   Nurse Communication Mobility status;Other (comment) (Pt on RA)        Time: 9806-9996 OT Time Calculation (min): 14 min  Charges: OT General Charges $OT Visit: 1 Visit OT Treatments $Self Care/Home Management : 8-22 mins  Jackquline Denmark, MS, OTR/L , CBIS ascom [phone]  10/14/22, 12:11 PM

## 2022-10-14 NOTE — Progress Notes (Signed)
PROGRESS NOTE  Trevor Montgomery YEH:[id number] DOB: 1961-07-04 DOA: 10/10/2022 PCP: Barbette Reichmann, MD  Hospital Course/Subjective: This is a 62 year old gentleman with a history of hypertension, schizoaffective disorder admitted to the hospital with complaints of cough, congestion and generalized weakness found to have community-acquired pneumonia in the setting of new lung mass.  Patient has been ill since about December 20, when he was seen by PCP treated with a course of prednisone and doxycycline after which she was feeling better, but after completing his antibiotic course started to feel worse again.  After admission to the hospital, he was given IV hydralazine for elevated blood pressure, and went into asymptomatic rapid A-fib, which resolved after dose of metoprolol.  He was admitted to the hospitalist service, and treatment was initiated with IV azithromycin and IV Rocephin.    Working with occupational therapy this morning, ambulating in the room on room air.  His sister is also at the bedside.  Assessment/Plan:  Principal Problem:   Acute respiratory failure with hypoxia (HCC), due to community-acquired pneumonia Active Problems:   Cough   CAP (community acquired pneumonia)   Centrilobular emphysema (HCC)   Tobacco abuse   Hyponatremia   Schizophrenia (HCC)   Hypertension   Lung mass  Assessment and Plan: Acute respiratory failure with hypoxia-due to community-acquired pneumonia, left hilar lung mass which is highly suspicious of malignancy -Continue inpatient care -Continue empiric antibiotic therapy as noted below -Supplemental oxygen to keep O2 saturation above 90%, wean as tolerated -Pulmonary toilet  Community-acquired pneumonia, patient is not meeting sepsis criteria -Continue empiric IV azithromycin and Rocephin for total 7 days  Lung mass-patient with left-sided pulmonary nodule, as well as hilar mass highly suspicious for malignancy -Pulmonology following, he is  now status post EBUS and biopsy performed on 1/19 -He was seen by oncology and radiation oncology; radiation oncology plans palliative radiation this week, anticipate planning CT today -He has had CT head (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, sodium level now significantly improved -Discontinue normal saline infusion -Will continue to monitor, recheck sodium level with morning labs  Hypomagnesemia-repleted as above, will recheck with morning labs  Hypertension-holding home lisinopril hydrochlorothiazide due to hyponatremia  Hyperlipidemia-continue home statin   DVT Prophylaxis: Heparin subcu  Code Status: Full code  Family Communication: Discussed with his Sister Patty at the bedside this morning  Disposition Plan: Likely home at discharge in 4-5 days pending establishment of palliative radiation, anticipate he will likely go home with home health  Consultants: Pulmonary Oncology Radiation oncology  Procedures: None  Antimicrobials: Anti-infectives (From admission, onward)    Start     Dose/Rate Route Frequency Ordered Stop   10/13/22 1800  azithromycin (ZITHROMAX) tablet 500 mg        500 mg Oral Daily 10/13/22 0740 10/15/22 1759   10/10/22 1730  cefTRIAXone (ROCEPHIN) 2 g in sodium chloride 0.9 % 100 mL IVPB  2 g 200  mL/hr over 30 Minutes Intravenous Every 24 hours 10/10/22 1728 10/15/22 1729   10/10/22 1730  azithromycin (ZITHROMAX) 500 mg in sodium chloride 0.9 % 250 mL IVPB  Status:  Discontinued        500 mg 250 mL/hr over 60 Minutes Intravenous Every 24 hours 10/10/22 1728 10/13/22 0740       Objective: Vitals:   10/13/22 1602 10/13/22 1709 10/13/22 2121 10/14/22 0008  BP:  129/85  (!) 146/82  Pulse:  100  97  Resp:  18  18  Temp:  99.2 F (37.3 C)  98 F (36.7 C)  TempSrc:      SpO2: 96% 97% 95% 98%  Weight:      Height:        Intake/Output Summary (Last 24 hours) at 10/14/2022 1032 Last data filed at 10/14/2022 0531 Gross per 24 hour  Intake --  Output 0 ml  Net 0 ml    Filed Weights   10/10/22 2309 10/11/22 1435  Weight: 75 kg 75 kg   Exam: General:  Alert, oriented, calm, in no acute distress, ambulating with OT this morning on room air Eyes: EOMI, clear conjuctivae, white sclerea Neck: supple, no masses, trachea mildline  Cardiovascular: RRR, no murmurs or rubs, no peripheral edema  Respiratory: Good bilateral air entry, no tachypnea or other evidence of respiratory distress, breath sounds diminished in the left midlung zone Abdomen: soft, nontender, nondistended, normal bowel tones heard  Skin: dry, no rashes  Musculoskeletal: no joint effusions, normal range of motion  Psychiatric: appropriate affect, normal speech  Neurologic: extraocular muscles intact, clear speech, moving all extremities with intact sensorium  Data Reviewed: CBC: Recent Labs  Lab 10/10/22 1730 10/12/22 0146 10/13/22 0525 10/14/22 0437  WBC 8.0 4.9 10.0 8.1  HGB 13.6 12.0* 11.8* 11.2*  HCT 40.3 35.9* 35.9* 34.1*  MCV 82.2 83.3 83.9 84.2  PLT 370 312 332 303    Basic Metabolic Panel: Recent Labs  Lab 10/10/22 2332 10/11/22 1302 10/11/22 1819 10/12/22 0146 10/12/22 0928 10/13/22 0525 10/14/22 0437  NA  --    < > 127* 129* 130* 130* 130*  K  --    < > 3.6 3.9 4.1 3.5 3.5  CL   --    < > 90* 93* 90* 95* 92*  CO2  --    < > 25 27 29 28 30   GLUCOSE  --    < > 93 95 89 87 81  BUN  --    < > 11 12 12 12 6   CREATININE  --    < > 0.50* 0.59* 0.59* 0.50* 0.38*  CALCIUM  --    < > 8.6* 8.8* 9.0 8.2* 8.2*  MG 1.6*  --   --  1.9  --   --   --    < > = values in this interval not displayed.    GFR: Estimated Creatinine Clearance: 96.2 mL/min (A) (by C-G formula based on SCr of 0.38 mg/dL (L)). Liver Function Tests: Recent Labs  Lab 10/10/22 1730  AST 28  ALT 23  ALKPHOS 56  BILITOT 0.8  PROT 6.8  ALBUMIN 3.9    No results for input(s): "LIPASE", "AMYLASE" in the last 168 hours. No results for input(s): "AMMONIA" in the last 168 hours. Coagulation Profile: No results for input(s): "INR", "PROTIME" in the last 168 hours. Cardiac Enzymes: No results for input(s): "CKTOTAL", "CKMB", "CKMBINDEX", "TROPONINI" in the last 168 hours. BNP (last  3 results) No results for input(s): "PROBNP" in the last 8760 hours. HbA1C: No results for input(s): "HGBA1C" in the last 72 hours. CBG: No results for input(s): "GLUCAP" in the last 168 hours. Lipid Profile: No results for input(s): "CHOL", "HDL", "LDLCALC", "TRIG", "CHOLHDL", "LDLDIRECT" in the last 72 hours. Thyroid Function Tests: No results for input(s): "TSH", "T4TOTAL", "FREET4", "T3FREE", "THYROIDAB" in the last 72 hours.  Anemia Panel: No results for input(s): "VITAMINB12", "FOLATE", "FERRITIN", "TIBC", "IRON", "RETICCTPCT" in the last 72 hours. Urine analysis: No results found for: "COLORURINE", "APPEARANCEUR", "LABSPEC", "PHURINE", "GLUCOSEU", "HGBUR", "BILIRUBINUR", "KETONESUR", "PROTEINUR", "UROBILINOGEN", "NITRITE", "LEUKOCYTESUR" Sepsis Labs: @LABRCNTIP (procalcitonin:4,lacticidven:4)  ) Recent Results (from the past 240 hour(s))  Resp panel by RT-PCR (RSV, Flu A&B, Covid) Anterior Nasal Swab     Status: None   Collection Time: 10/10/22  5:30 PM   Specimen: Anterior Nasal Swab  Result Value Ref Range  Status   SARS Coronavirus 2 by RT PCR NEGATIVE NEGATIVE Final    Comment: (NOTE) SARS-CoV-2 target nucleic acids are NOT DETECTED.  The SARS-CoV-2 RNA is generally detectable in upper respiratory specimens during the acute phase of infection. The lowest concentration of SARS-CoV-2 viral copies this assay can detect is 138 copies/mL. A negative result does not preclude SARS-Cov-2 infection and should not be used as the sole basis for treatment or other patient management decisions. A negative result may occur with  improper specimen collection/handling, submission of specimen other than nasopharyngeal swab, presence of viral mutation(s) within the areas targeted by this assay, and inadequate number of viral copies(<138 copies/mL). A negative result must be combined with clinical observations, patient history, and epidemiological information. The expected result is Negative.  Fact Sheet for Patients:  EntrepreneurPulse.com.au  Fact Sheet for Healthcare Providers:  IncredibleEmployment.be  This test is no t yet approved or cleared by the [place] FDA and  has been authorized for detection and/or diagnosis of SARS-CoV-2 by FDA under an Emergency Use Authorization (EUA). This EUA will remain  in effect (meaning this test can be used) for the duration of the COVID-19 declaration under Section 564(b)(1) of the Act, 21 U.S.C.section 360bbb-3(b)(1), unless the authorization is terminated  or revoked sooner.       Influenza A by PCR NEGATIVE NEGATIVE Final   Influenza B by PCR NEGATIVE NEGATIVE Final    Comment: (NOTE) The Xpert Xpress SARS-CoV-2/FLU/RSV plus assay is intended as an aid in the diagnosis of influenza from Nasopharyngeal swab specimens and should not be used as a sole basis for treatment. Nasal washings and aspirates are unacceptable for Xpert Xpress SARS-CoV-2/FLU/RSV testing.  Fact Sheet for  Patients: EntrepreneurPulse.com.au  Fact Sheet for Healthcare Providers: IncredibleEmployment.be  This test is not yet approved or cleared by the [place] FDA and has been authorized for detection and/or diagnosis of SARS-CoV-2 by FDA under an Emergency Use Authorization (EUA). This EUA will remain in effect (meaning this test can be used) for the duration of the COVID-19 declaration under Section 564(b)(1) of the Act, 21 U.S.C. section 360bbb-3(b)(1), unless the authorization is terminated or revoked.     Resp Syncytial Virus by PCR NEGATIVE NEGATIVE Final    Comment: (NOTE) Fact Sheet for Patients: EntrepreneurPulse.com.au  Fact Sheet for Healthcare Providers: IncredibleEmployment.be  This test is not yet approved or cleared by the [place] FDA and has been authorized for detection and/or diagnosis of SARS-CoV-2 by FDA under an Emergency Use Authorization (EUA). This EUA will remain in effect (meaning this test can be used) for the  duration of the COVID-19 declaration under Section 564(b)(1) of the Act, 21 U.S.C. section 360bbb-3(b)(1), unless the authorization is terminated or revoked.  Performed at [facility], [address] [place], [place] [address]   Culture, Respiratory w Gram Stain     Status: None   Collection Time: 10/11/22  4:59 PM   Specimen: Stump; Respiratory  Result Value Ref Range Status   Specimen Description   Final    STUMP Performed at [facility], [address], [place], [place] [address]    Special Requests   Final    NONE Performed at [facility], [address] [place], [place] [address]    Gram Stain   Final    MODERATE WBC PRESENT, PREDOMINANTLY PMN NO ORGANISMS SEEN    Culture   Final    RARE Normal respiratory flora-no Staph aureus or Pseudomonas seen Performed at [facility], [address], [place], [place]  [address]    Report Status 10/14/2022 FINAL  Final     Studies: No results found.  Scheduled Meds:  azithromycin  500 mg Oral Daily   ipratropium-albuterol  3 mL Nebulization QID   tranexamic acid  1,000 mg Topical Once    Continuous Infusions:  cefTRIAXone (ROCEPHIN)  IV 2 g (10/13/22 2029)     LOS: 4 days   Time spent: 31 minutes  Dollye Glasser Vergie Living, MD [facility] Pager [phone]  If 7PM-7AM, please contact night-coverage [email or web address] Password [facility] 10/14/2022, 10:32 AM

## 2022-10-14 NOTE — Plan of Care (Signed)

## 2022-10-15 ENCOUNTER — Other Ambulatory Visit: Payer: Self-pay | Admitting: Anatomic Pathology & Clinical Pathology

## 2022-10-15 DIAGNOSIS — J9601 Acute respiratory failure with hypoxia: Secondary | ICD-10-CM | POA: Diagnosis not present

## 2022-10-15 LAB — BASIC METABOLIC PANEL
Anion gap: 9 (ref 5–15)
BUN: 9 mg/dL (ref 6–20)
CO2: 30 mmol/L (ref 22–32)
Calcium: 8.5 mg/dL — ABNORMAL LOW (ref 8.9–10.3)
Chloride: 92 mmol/L — ABNORMAL LOW (ref 98–111)
Creatinine, Ser: 0.46 mg/dL — ABNORMAL LOW (ref 0.61–1.24)
GFR, Estimated: >60 mL/min (ref >60)
Glucose, Bld: 94 mg/dL (ref 70–99)
Potassium: 3.7 mmol/L (ref 3.5–5.1)
Sodium: 131 mmol/L — ABNORMAL LOW (ref 135–145)

## 2022-10-15 LAB — CBC
HCT: 37.6 % — ABNORMAL LOW (ref 39.0–52.0)
Hemoglobin: 12.2 g/dL — ABNORMAL LOW (ref 13.0–17.0)
MCH: 27.7 pg (ref 26.0–34.0)
MCHC: 32.4 g/dL (ref 30.0–36.0)
MCV: 85.3 fL (ref 80.0–100.0)
Platelets: 337 10*3/uL (ref 150–400)
RBC: 4.41 MIL/uL (ref 4.22–5.81)
RDW: 14 % (ref 11.5–15.5)
WBC: 8.1 10*3/uL (ref 4.0–10.5)
nRBC: 0 % (ref 0.0–0.2)

## 2022-10-15 LAB — CYTOLOGY - NON PAP

## 2022-10-15 NOTE — Progress Notes (Signed)
PROGRESS NOTE  Trevor Montgomery AXK:[id number] DOB: 03/02/1961 DOA: 10/10/2022 PCP: Barbette Reichmann, MD  Hospital Course/Subjective: This is a 62 year old gentleman with a history of hypertension, schizoaffective disorder admitted to the hospital with complaints of cough, congestion and generalized weakness found to have community-acquired pneumonia in the setting of new lung mass.  Patient has been ill since about December 20, when he was seen by PCP treated with a course of prednisone and doxycycline after which she was feeling better, but after completing his antibiotic course started to feel worse again.  After admission to the hospital, he was given IV hydralazine for elevated blood pressure, and went into asymptomatic rapid A-fib, which resolved after dose of metoprolol.  He was admitted to the hospitalist service, and treatment was initiated with IV azithromycin and IV Rocephin.    Ambulating in his room with nursing student this morning.  He is doing well and has no complaints.  Per nursing staff, he did go down to radiology yesterday morning for CT planning.  Assessment/Plan:  Principal Problem:   Acute respiratory failure with hypoxia (HCC), due to community-acquired pneumonia Active Problems:   Cough   CAP (community acquired pneumonia)   Centrilobular emphysema (HCC)   Tobacco abuse   Hyponatremia   Schizophrenia (HCC)   Hypertension   Lung mass  Assessment and Plan: Acute respiratory failure with hypoxia-due to community-acquired pneumonia, left hilar lung mass which is highly suspicious of malignancy -Continue inpatient care -Continue empiric antibiotic therapy as noted below -Supplemental oxygen to keep O2 saturation above 90%, wean as tolerated -Pulmonary toilet  Community-acquired pneumonia, patient is not meeting sepsis criteria -Continue empiric IV azithromycin and Rocephin for total 7 days  Lung mass-patient with left-sided pulmonary nodule, as well as hilar mass  highly suspicious for malignancy -Pulmonology following, he is now status post EBUS and biopsy performed on 1/19, biopsy results still in process -He was seen by oncology and radiation oncology; radiation oncology plans palliative radiation this week, anticipate it may start 1/24 per their last note -He has had CT head (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  As initial workup is unremarkable, will involve cardiology only if A-fib recurs; seems it was directly in response to IV hydralazine. -Continue cardiac telemetry -Replete magnesium -Thyroid panel and 2D echo unremarkable  Hyponatremia-sodium 121 on arrival, patient without confusion, headache or other concerning symptoms; could be SIADH related to his pneumonia/pulmonary mass, sodium level now significantly improved and stable without fluid infusion -Discontinued normal saline infusion -Will continue to monitor, recheck sodium level with morning labs  Hypomagnesemia-repleted as above, will recheck with morning labs  Hypertension-holding home lisinopril hydrochlorothiazide due to hyponatremia  Hyperlipidemia-continue home statin   DVT Prophylaxis: Heparin subcu  Code Status: Full code  Family Communication: Discussed with his Sister Patty at the bedside 1/22  Disposition Plan: Likely home at discharge in 4-5 days pending establishment of palliative radiation, anticipate he will likely go home with home health  Consultants: Pulmonary Oncology Radiation oncology  Procedures: None  Antimicrobials: Anti-infectives (From admission, onward)    Start     Dose/Rate Route Frequency Ordered Stop   10/13/22  1800  azithromycin (ZITHROMAX) tablet 500 mg  500 mg Oral Daily 10/13/22 0740 10/14/22 1720   10/10/22 1730  cefTRIAXone (ROCEPHIN) 2 g in sodium chloride 0.9 % 100 mL IVPB        2 g 200 mL/hr over 30 Minutes Intravenous Every 24 hours 10/10/22 1728 10/15/22 1729   10/10/22 1730  azithromycin (ZITHROMAX) 500 mg in sodium chloride 0.9 % 250 mL IVPB  Status:  Discontinued        500 mg 250 mL/hr over 60 Minutes Intravenous Every 24 hours 10/10/22 1728 10/13/22 0740       Objective: Vitals:   10/14/22 2058 10/15/22 0749 10/15/22 0900 10/15/22 1000  BP:   (!) 168/82 (!) 148/72  Pulse:  87 (!) 110 (!) 110  Resp:  17 (!) 24 (!) 26  Temp:      TempSrc:      SpO2: 94% 97% 95% 92%  Weight:      Height:        Intake/Output Summary (Last 24 hours) at 10/15/2022 1105 Last data filed at 10/14/2022 1543 Gross per 24 hour  Intake --  Output 700 ml  Net -700 ml    Filed Weights   10/10/22 2309 10/11/22 1435  Weight: 75 kg 75 kg   Exam: General:  Alert, oriented, calm, in no acute distress  Eyes: EOMI, clear conjuctivae, white sclerea Neck: supple, no masses, trachea mildline  Cardiovascular: RRR, no murmurs or rubs, no peripheral edema  Respiratory: clear to auscultation bilaterally, no wheezes, no crackles  Abdomen: soft, nontender, nondistended, normal bowel tones heard  Skin: dry, no rashes  Musculoskeletal: no joint effusions, normal range of motion  Psychiatric: appropriate affect, normal speech  Neurologic: extraocular muscles intact, clear speech, moving all extremities with intact sensorium  Data Reviewed: CBC: Recent Labs  Lab 10/10/22 1730 10/12/22 0146 10/13/22 0525 10/14/22 0437 10/15/22 0451  WBC 8.0 4.9 10.0 8.1 8.1  HGB 13.6 12.0* 11.8* 11.2* 12.2*  HCT 40.3 35.9* 35.9* 34.1* 37.6*  MCV 82.2 83.3 83.9 84.2 85.3  PLT 370 312 332 303 337    Basic Metabolic Panel: Recent Labs  Lab 10/10/22 2332 10/11/22 1302 10/12/22 0146 10/12/22 0928  10/13/22 0525 10/14/22 0437 10/15/22 0451  NA  --    < > 129* 130* 130* 130* 131*  K  --    < > 3.9 4.1 3.5 3.5 3.7  CL  --    < > 93* 90* 95* 92* 92*  CO2  --    < > 27 29 28 30 30   GLUCOSE  --    < > 95 89 87 81 94  BUN  --    < > 12 12 12 6 9   CREATININE  --    < > 0.59* 0.59* 0.50* 0.38* 0.46*  CALCIUM  --    < > 8.8* 9.0 8.2* 8.2* 8.5*  MG 1.6*  --  1.9  --   --   --   --    < > = values in this interval not displayed.    GFR: Estimated Creatinine Clearance: 96.2 mL/min (A) (by C-G formula based on SCr of 0.46 mg/dL (L)). Liver Function Tests: Recent Labs  Lab 10/10/22 1730  AST 28  ALT 23  ALKPHOS 56  BILITOT 0.8  PROT 6.8  ALBUMIN 3.9    No results for input(s): "LIPASE", "AMYLASE" in the last 168 hours. No results for input(s): "AMMONIA" in the last 168 hours. Coagulation Profile: No results for input(s): "INR", "PROTIME" in the last  168 hours. Cardiac Enzymes: No results for input(s): "CKTOTAL", "CKMB", "CKMBINDEX", "TROPONINI" in the last 168 hours. BNP (last 3 results) No results for input(s): "PROBNP" in the last 8760 hours. HbA1C: No results for input(s): "HGBA1C" in the last 72 hours. CBG: No results for input(s): "GLUCAP" in the last 168 hours. Lipid Profile: No results for input(s): "CHOL", "HDL", "LDLCALC", "TRIG", "CHOLHDL", "LDLDIRECT" in the last 72 hours. Thyroid Function Tests: No results for input(s): "TSH", "T4TOTAL", "FREET4", "T3FREE", "THYROIDAB" in the last 72 hours.  Anemia Panel: No results for input(s): "VITAMINB12", "FOLATE", "FERRITIN", "TIBC", "IRON", "RETICCTPCT" in the last 72 hours. Urine analysis: No results found for: "COLORURINE", "APPEARANCEUR", "LABSPEC", "PHURINE", "GLUCOSEU", "HGBUR", "BILIRUBINUR", "KETONESUR", "PROTEINUR", "UROBILINOGEN", "NITRITE", "LEUKOCYTESUR" Sepsis Labs: @LABRCNTIP (procalcitonin:4,lacticidven:4)  ) Recent Results (from the past 240 hour(s))  Resp panel by RT-PCR (RSV, Flu A&B, Covid) Anterior  Nasal Swab     Status: None   Collection Time: 10/10/22  5:30 PM   Specimen: Anterior Nasal Swab  Result Value Ref Range Status   SARS Coronavirus 2 by RT PCR NEGATIVE NEGATIVE Final    Comment: (NOTE) SARS-CoV-2 target nucleic acids are NOT DETECTED.  The SARS-CoV-2 RNA is generally detectable in upper respiratory specimens during the acute phase of infection. The lowest concentration of SARS-CoV-2 viral copies this assay can detect is 138 copies/mL. A negative result does not preclude SARS-Cov-2 infection and should not be used as the sole basis for treatment or other patient management decisions. A negative result may occur with  improper specimen collection/handling, submission of specimen other than nasopharyngeal swab, presence of viral mutation(s) within the areas targeted by this assay, and inadequate number of viral copies(<138 copies/mL). A negative result must be combined with clinical observations, patient history, and epidemiological information. The expected result is Negative.  Fact Sheet for Patients:  EntrepreneurPulse.com.au  Fact Sheet for Healthcare Providers:  IncredibleEmployment.be  This test is no t yet approved or cleared by the [place] FDA and  has been authorized for detection and/or diagnosis of SARS-CoV-2 by FDA under an Emergency Use Authorization (EUA). This EUA will remain  in effect (meaning this test can be used) for the duration of the COVID-19 declaration under Section 564(b)(1) of the Act, 21 U.S.C.section 360bbb-3(b)(1), unless the authorization is terminated  or revoked sooner.       Influenza A by PCR NEGATIVE NEGATIVE Final   Influenza B by PCR NEGATIVE NEGATIVE Final    Comment: (NOTE) The Xpert Xpress SARS-CoV-2/FLU/RSV plus assay is intended as an aid in the diagnosis of influenza from Nasopharyngeal swab specimens and should not be used as a sole basis for treatment. Nasal washings  and aspirates are unacceptable for Xpert Xpress SARS-CoV-2/FLU/RSV testing.  Fact Sheet for Patients: EntrepreneurPulse.com.au  Fact Sheet for Healthcare Providers: IncredibleEmployment.be  This test is not yet approved or cleared by the [place] FDA and has been authorized for detection and/or diagnosis of SARS-CoV-2 by FDA under an Emergency Use Authorization (EUA). This EUA will remain in effect (meaning this test can be used) for the duration of the COVID-19 declaration under Section 564(b)(1) of the Act, 21 U.S.C. section 360bbb-3(b)(1), unless the authorization is terminated or revoked.     Resp Syncytial Virus by PCR NEGATIVE NEGATIVE Final    Comment: (NOTE) Fact Sheet for Patients: EntrepreneurPulse.com.au  Fact Sheet for Healthcare Providers: IncredibleEmployment.be  This test is not yet approved or cleared by the [place] FDA and has been authorized for detection and/or diagnosis of SARS-CoV-2 by FDA under  an Emergency Use Authorization (EUA). This EUA will remain in effect (meaning this test can be used) for the duration of the COVID-19 declaration under Section 564(b)(1) of the Act, 21 U.S.C. section 360bbb-3(b)(1), unless the authorization is terminated or revoked.  Performed at [facility], [address] [place], [place] [address]   Culture, Respiratory w Gram Stain     Status: None   Collection Time: 10/11/22  4:59 PM   Specimen: Stump; Respiratory  Result Value Ref Range Status   Specimen Description   Final    STUMP Performed at [facility], [address], [place], [place] [address]    Special Requests   Final    NONE Performed at [facility], [address] [place], [place] [address]    Gram Stain   Final    MODERATE WBC PRESENT, PREDOMINANTLY PMN NO ORGANISMS SEEN    Culture   Final    RARE Normal respiratory flora-no Staph  aureus or Pseudomonas seen Performed at [facility], [address] [place], [place] [address]    Report Status 10/14/2022 FINAL  Final     Studies: No results found.  Scheduled Meds:  ipratropium-albuterol  3 mL Nebulization TID   tranexamic acid  1,000 mg Topical Once    Continuous Infusions:  cefTRIAXone (ROCEPHIN)  IV Stopped (10/14/22 1753)     LOS: 5 days   Time spent: 31 minutes  Aliya Sol Vergie Living, MD [facility] Pager [phone]  If 7PM-7AM, please contact night-coverage [email or web address] Password [facility] 10/15/2022, 11:05 AM

## 2022-10-15 NOTE — Progress Notes (Signed)
Nebulizer not given due to HR rising through out the day. RN aware

## 2022-10-15 NOTE — TOC Progression Note (Signed)
Transition of Care [facility]) - Progression Note    Patient Details  Name: Trevor Montgomery MRN: [id number] Date of Birth: Nov 25, 1960  Transition of Care [facility]) CM/SW Contact  Marlowe Sax, RN Phone Number: 10/15/2022, 3:59 PM  Clinical Narrative:    The patient was Ambulating in his room with nursing student this morning. He is doing well and has no complaints.  Pulmonology following, he is now status post EBUS and biopsy performed on 1/19, biopsy results still in process  He was seen by oncology and radiation oncology; radiation oncology plans palliative radiation this week, anticipate it may start 1/24 per their last note  Will likely have a  outpatient PET scan  To follow up EDD to home in 4-5 days  May need [facility]  Expected Discharge Plan: Home/Self Care Barriers to Discharge: No Barriers Identified  Expected Discharge Plan and Services   Discharge Planning Services: CM Consult   Living arrangements for the past 2 months: Single Family Home                                       Social Determinants of Health (SDOH) Interventions SDOH Screenings   Food Insecurity: No Food Insecurity (10/10/2022)  Housing: Low Risk  (10/10/2022)  Transportation Needs: No Transportation Needs (10/10/2022)  Utilities: Not At Risk (10/10/2022)  Tobacco Use: High Risk (10/14/2022)    Readmission Risk Interventions     No data to display

## 2022-10-15 NOTE — Progress Notes (Signed)
Nebulizer not given at this time due to pt HR in 150's. RN aware

## 2022-10-15 NOTE — Progress Notes (Signed)
Physical Therapy Treatment Patient Details Name: Trevor Montgomery MRN: [id number] DOB: March 05, 1961 Today's Date: 10/15/2022   History of Present Illness Pt is a 62 year old M with a history of hypertension, schizoaffective disorder admitted to the hospital with complaints of cough, congestion and generalized weakness found to have community-acquired pneumonia in the setting of new lung mass.  Patient has been ill since about December 20, when he was seen by PCP treated with a course of prednisone and doxycycline after which she was feeling better, but after completing his antibiotic course started to feel worse again. MD assessment includes: Acute respiratory failure with hypoxia due to community-acquired pneumonia, left hilar lung mass of unclear etiology, and hyponatremia.    PT Comments    Pt very pleasant and talkative. Anxious to complete mobility out of room.  SpO2 at 84% after utilizing bathroom on RA. Pt remained on 1 L O2 throughout gait training, no noted SOB, however SpO2 did drop to 86% returning to 90% upon seated rest break. Will continue to see acutely per POC as pt begins treatment for CA.   Recommendations for follow up therapy are one component of a multi-disciplinary discharge planning process, led by the attending physician.  Recommendations may be updated based on patient status, additional functional criteria and insurance authorization.  Follow Up Recommendations  Home health PT     Assistance Recommended at Discharge Frequent or constant Supervision/Assistance  Patient can return home with the following A little help with walking and/or transfers;A little help with bathing/dressing/bathroom;Assistance with cooking/housework;Assist for transportation;Help with stairs or ramp for entrance   Equipment Recommendations  None recommended by PT    Recommendations for Other Services       Precautions / Restrictions Precautions Precautions: Fall Restrictions Weight Bearing  Restrictions: No     Mobility  Bed Mobility Overal bed mobility: Modified Independent                  Transfers Overall transfer level: Needs assistance Equipment used: None Transfers: Sit to/from Stand Sit to Stand: Supervision           General transfer comment: Good concentric and fair eccentric control and stability    Ambulation/Gait Ambulation/Gait assistance: Min guard Gait Distance (Feet): 400 Feet Assistive device: None Gait Pattern/deviations: Step-through pattern, Decreased step length - right, Decreased step length - left, Drifts right/left Gait velocity: decreased     General Gait Details: Slow cadence with short B step length with minor drifting but no overt LOB   Stairs             Wheelchair Mobility    Modified Rankin (Stroke Patients Only)       Balance Overall balance assessment: Needs assistance, History of Falls Sitting-balance support: Feet supported Sitting balance-Leahy Scale: Normal     Standing balance support: No upper extremity supported Standing balance-Leahy Scale: Fair                              Cognition Arousal/Alertness: Awake/alert Behavior During Therapy: WFL for tasks assessed/performed Overall Cognitive Status: Within Functional Limits for tasks assessed                                 General Comments: Pt is pleasant and cooperative and making jokes in room with sister present.        Exercises Other Exercises Other Exercises: Pt demonstrated  independent ability to utilize bathroom without functional assist    General Comments General comments (skin integrity, edema, etc.):  (Pt very pleasant and talkative. Educated on safety awareness and decreasing fall risk with increased focus to tasks)      Pertinent Vitals/Pain Pain Assessment Pain Assessment: No/denies pain    Home Living                          Prior Function            PT Goals (current  goals can now be found in the care plan section) Acute Rehab PT Goals Patient Stated Goal: To get stronger Progress towards PT goals: Progressing toward goals    Frequency    Min 2X/week      PT Plan Current plan remains appropriate    Co-evaluation              AM-PAC PT "6 Clicks" Mobility   Outcome Measure  Help needed turning from your back to your side while in a flat bed without using bedrails?: A Little Help needed moving from lying on your back to sitting on the side of a flat bed without using bedrails?: A Little Help needed moving to and from a bed to a chair (including a wheelchair)?: A Little Help needed standing up from a chair using your arms (e.g., wheelchair or bedside chair)?: A Little Help needed to walk in hospital room?: A Little Help needed climbing 3-5 steps with a railing? : A Little 6 Click Score: 18    End of Session Equipment Utilized During Treatment: Gait belt;Oxygen Activity Tolerance: Patient tolerated treatment well Patient left: in chair;with call bell/phone within reach;with chair alarm set;with family/visitor present Nurse Communication: Mobility status PT Visit Diagnosis: Unsteadiness on feet (R26.81);History of falling (Z91.81);Difficulty in walking, not elsewhere classified (R26.2);Muscle weakness (generalized) (M62.81)     Time: 7130-0801 PT Time Calculation (min) (ACUTE ONLY): 31 min  Charges:  $Gait Training: 8-22 mins $Therapeutic Activity: 8-22 mins                    Zadie Cleverly, PTA    Jannet Askew 10/15/2022, 5:05 PM

## 2022-10-16 ENCOUNTER — Encounter: Payer: Self-pay | Admitting: Oncology

## 2022-10-16 DIAGNOSIS — Z515 Encounter for palliative care: Secondary | ICD-10-CM | POA: Diagnosis not present

## 2022-10-16 DIAGNOSIS — J9601 Acute respiratory failure with hypoxia: Secondary | ICD-10-CM | POA: Diagnosis not present

## 2022-10-16 LAB — BASIC METABOLIC PANEL
Anion gap: 5 (ref 5–15)
BUN: 11 mg/dL (ref 6–20)
CO2: 32 mmol/L (ref 22–32)
Calcium: 8.5 mg/dL — ABNORMAL LOW (ref 8.9–10.3)
Chloride: 96 mmol/L — ABNORMAL LOW (ref 98–111)
Creatinine, Ser: 0.5 mg/dL — ABNORMAL LOW (ref 0.61–1.24)
GFR, Estimated: >60 mL/min (ref >60)
Glucose, Bld: 94 mg/dL (ref 70–99)
Potassium: 3.8 mmol/L (ref 3.5–5.1)
Sodium: 133 mmol/L — ABNORMAL LOW (ref 135–145)

## 2022-10-16 LAB — CBC
HCT: 36 % — ABNORMAL LOW (ref 39.0–52.0)
Hemoglobin: 11.4 g/dL — ABNORMAL LOW (ref 13.0–17.0)
MCH: 27.2 pg (ref 26.0–34.0)
MCHC: 31.7 g/dL (ref 30.0–36.0)
MCV: 85.9 fL (ref 80.0–100.0)
Platelets: 321 10*3/uL (ref 150–400)
RBC: 4.19 MIL/uL — ABNORMAL LOW (ref 4.22–5.81)
RDW: 14.1 % (ref 11.5–15.5)
WBC: 8.2 10*3/uL (ref 4.0–10.5)
nRBC: 0 % (ref 0.0–0.2)

## 2022-10-16 MED ORDER — BISACODYL 5 MG PO TBEC: 5.0000 mg | DELAYED_RELEASE_TABLET | Freq: Every day | ORAL | 0 refills | Status: DC | PRN

## 2022-10-16 MED ORDER — LISINOPRIL 20 MG PO TABS
20.0000 mg | ORAL_TABLET | Freq: Every day | ORAL | Status: DC
  Administered 2022-10-16: 20 mg via ORAL
  Filled 2022-10-16: qty 1

## 2022-10-16 MED ORDER — AMLODIPINE BESYLATE 10 MG PO TABS: 10.0000 mg | ORAL_TABLET | Freq: Every day | ORAL | 0 refills | Status: DC

## 2022-10-16 MED ORDER — HYDROCHLOROTHIAZIDE 12.5 MG PO TABS
12.5000 mg | ORAL_TABLET | Freq: Every day | ORAL | Status: DC
  Administered 2022-10-16: 12.5 mg via ORAL
  Filled 2022-10-16: qty 1

## 2022-10-16 MED ORDER — IPRATROPIUM-ALBUTEROL 0.5-2.5 (3) MG/3ML IN SOLN: 3.0000 mL | Freq: Two times a day (BID) | RESPIRATORY_TRACT | Status: DC

## 2022-10-16 MED ORDER — MELOXICAM 7.5 MG PO TABS
15.0000 mg | ORAL_TABLET | Freq: Every day | ORAL | Status: DC
  Administered 2022-10-16: 15 mg via ORAL
  Filled 2022-10-16: qty 2

## 2022-10-16 MED ORDER — VITAMIN E 180 MG (400 UNIT) PO CAPS
400.0000 [IU] | ORAL_CAPSULE | Freq: Every day | ORAL | Status: DC
  Administered 2022-10-16: 400 [IU] via ORAL
  Filled 2022-10-16: qty 1
  Filled 2022-10-16: qty 4

## 2022-10-16 MED ORDER — NICOTINE 21 MG/24HR TD PT24
21.0000 mg | MEDICATED_PATCH | Freq: Every day | TRANSDERMAL | Status: DC
  Filled 2022-10-16: qty 1

## 2022-10-16 MED ORDER — PROPRANOLOL HCL 10 MG PO TABS
5.0000 mg | ORAL_TABLET | Freq: Two times a day (BID) | ORAL | Status: DC
  Administered 2022-10-16: 5 mg via ORAL
  Filled 2022-10-16: qty 0.5

## 2022-10-16 MED ORDER — FLUPHENAZINE HCL 5 MG PO TABS
2.5000 mg | ORAL_TABLET | Freq: Every day | ORAL | Status: DC
  Administered 2022-10-16: 2.5 mg via ORAL
  Filled 2022-10-16 (×2): qty 1

## 2022-10-16 MED ORDER — GUAIFENESIN ER 600 MG PO TB12: 600.0000 mg | ORAL_TABLET | Freq: Two times a day (BID) | ORAL | 0 refills | Status: DC | PRN

## 2022-10-16 MED ORDER — PROPRANOLOL HCL 10 MG PO TABS: 5.0000 mg | ORAL_TABLET | Freq: Two times a day (BID) | ORAL | 0 refills | Status: DC

## 2022-10-16 MED ORDER — BENZTROPINE MESYLATE 1 MG PO TABS
2.0000 mg | ORAL_TABLET | Freq: Two times a day (BID) | ORAL | Status: DC
  Administered 2022-10-16: 2 mg via ORAL
  Filled 2022-10-16: qty 2

## 2022-10-16 MED ORDER — DOCUSATE SODIUM 100 MG PO CAPS: 100.0000 mg | ORAL_CAPSULE | Freq: Two times a day (BID) | ORAL | 0 refills | Status: DC | PRN

## 2022-10-16 MED ORDER — ADULT MULTIVITAMIN W/MINERALS CH
1.0000 | ORAL_TABLET | Freq: Every day | ORAL | Status: DC
  Administered 2022-10-16: 1 via ORAL
  Filled 2022-10-16: qty 1

## 2022-10-16 MED ORDER — SIMVASTATIN 20 MG PO TABS: 20.0000 mg | ORAL_TABLET | Freq: Every day | ORAL | Status: DC

## 2022-10-16 MED ORDER — ALBUTEROL SULFATE (2.5 MG/3ML) 0.083% IN NEBU: 3.0000 mL | INHALATION_SOLUTION | Freq: Four times a day (QID) | RESPIRATORY_TRACT | Status: DC | PRN

## 2022-10-16 MED ORDER — LISINOPRIL-HYDROCHLOROTHIAZIDE 20-12.5 MG PO TABS: 1.0000 | ORAL_TABLET | Freq: Every day | ORAL | Status: DC

## 2022-10-16 NOTE — Hospital Course (Signed)
62 year old gentleman with a history of hypertension, schizoaffective disorder admitted to the hospital with complaints of cough, congestion and generalized weakness found to have community-acquired pneumonia in the setting of new lung mass. Patient has been ill since about December 20, when he was seen by PCP treated with a course of prednisone and doxycycline after which she was feeling better, but after completing his antibiotic course started to feel worse again. After admission to the hospital, he was given IV hydralazine for elevated blood pressure, and went into asymptomatic rapid A-fib, which resolved after dose of metoprolol. He was admitted to the hospitalist service, and treatment was initiated with IV azithromycin and IV Rocephin. Pulmonology team and Medical + Radiation Oncology teams following, he is status post EBUS and biopsy performed on 1/19, biopsy results still in process, starting palliative radiation today 10/16/22. EDD to home approx 01/30-01/31 w/ home health    Consultants:  Pulmonology  Medical Oncology Radiation Oncology   Procedures: Bronchoscopy 10/11/22 w/ biopsy       ASSESSMENT & PLAN:   Principal Problem:   Acute respiratory failure with hypoxia (HCC) Active Problems:   Cough   CAP (community acquired pneumonia)   Centrilobular emphysema ([place])   Tobacco abuse   Hyponatremia   Schizophrenia ([place])   Hypertension   Sepsis due to pneumonia ([place])   Lung mass  Acute respiratory failure with hypoxia-due to community-acquired pneumonia, left hilar lung mass which is highly suspicious of malignancy Community-acquired pneumonia, patient is not meeting sepsis criteria Continue empiric antibiotic therapy IV azithromycin and Rocephin for total 7 days Supplemental oxygen to keep O2 saturation above 90%, wean as tolerated Pulmonary toilet   Lung mass-patient with left-sided pulmonary nodule, as well as hilar mass highly suspicious for malignancy Pulmonology  following, he is now status post EBUS and biopsy performed on 1/19, biopsy results still in process Radiation oncology plans palliative radiation this week, anticipate it may start 1/24  He has had CT head (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  As initial workup is unremarkable, will involve cardiology only if A-fib recurs; seems it was directly in response to IV hydralazine. -Continue cardiac telemetry -Replete magnesium -Thyroid panel and 2D echo unremarkable   Hyponatremia-sodium 121 on arrival, patient without confusion, headache or other concerning symptoms; could be SIADH related to his pneumonia/pulmonary mass, sodium level now significantly improved and stable without fluid infusion -Discontinued normal saline infusion -Will continue to monitor, recheck sodium level with morning labs   Hypomagnesemia-repleted as above, will recheck with morning labs   Hypertension-holding home lisinopril hydrochlorothiazide due to hyponatremia   Hyperlipidemia-continue home statin     DVT prophylaxis: *** Pertinent IV fluids/nutrition: *** Central lines / invasive devices: ***  Code Status: ***  Current Admission Status: ***  TOC needs / Dispo plan: *** Barriers to discharge / significant pending items: ***

## 2022-10-16 NOTE — Progress Notes (Signed)
Occupational Therapy Treatment Patient Details Name: Trevor Montgomery MRN: [id number] DOB: 1960/10/30 Today's Date: 10/16/2022   History of present illness Pt is a 62 year old M with a history of hypertension, schizoaffective disorder admitted to the hospital with complaints of cough, congestion and generalized weakness found to have community-acquired pneumonia in the setting of new lung mass.  Patient has been ill since about December 20, when he was seen by PCP treated with a course of prednisone and doxycycline after which she was feeling better, but after completing his antibiotic course started to feel worse again. MD assessment includes: Acute respiratory failure with hypoxia due to community-acquired pneumonia, left hilar lung mass of unclear etiology, and hyponatremia.   OT comments  Upon entering the room, pt seated in recliner chair with sister present. Sister reporting needing to leave to get things together at home. Plan to assist pt with dressing for planned discharge later today. Pt obtaining bag with clothing items in it and donning jeans and belt with increased time and supervision. Pt using figure four position to don and tie shoe laces. Pt sits to don pull over shirt. Pt remains on O2 via session and Oxygen delivered during session with strict instructions to call when leaving the hospital. Message delivered to RN. Pt making excellent progress towards goals and continued recommendation for [place] services.    Recommendations for follow up therapy are one component of a multi-disciplinary discharge planning process, led by the attending physician.  Recommendations may be updated based on patient status, additional functional criteria and insurance authorization.    Follow Up Recommendations  Home health OT     Assistance Recommended at Discharge Intermittent Supervision/Assistance  Patient can return home with the following  A little help with walking and/or transfers;A little help with  bathing/dressing/bathroom;Help with stairs or ramp for entrance;Assist for transportation;Assistance with cooking/housework   Equipment Recommendations  None recommended by OT       Precautions / Restrictions Precautions Precautions: Fall       Mobility Bed Mobility               General bed mobility comments: seated in recliner chair    Transfers Overall transfer level: Needs assistance Equipment used: None Transfers: Sit to/from Stand Sit to Stand: Supervision                 Balance Overall balance assessment: Needs assistance, History of Falls Sitting-balance support: Feet supported Sitting balance-Leahy Scale: Normal     Standing balance support: No upper extremity supported Standing balance-Leahy Scale: Fair                             ADL either performed or assessed with clinical judgement   ADL                   Upper Body Dressing : Supervision/safety   Lower Body Dressing: Supervision/safety                      Extremity/Trunk Assessment Upper Extremity Assessment Upper Extremity Assessment: Generalized weakness   Lower Extremity Assessment Lower Extremity Assessment: Generalized weakness        Vision Patient Visual Report: No change from baseline            Cognition Arousal/Alertness: Awake/alert Behavior During Therapy: WFL for tasks assessed/performed Overall Cognitive Status: Within Functional Limits for tasks assessed  General Comments: Pt is pleasant and cooperative                   Pertinent Vitals/ Pain       Pain Assessment Pain Assessment: No/denies pain         Frequency  Min 2X/week        Progress Toward Goals  OT Goals(current goals can now be found in the care plan section)  Progress towards OT goals: Progressing toward goals  Acute Rehab OT Goals Patient Stated Goal: to go home OT Goal Formulation: With  patient Time For Goal Achievement: 10/25/22 Potential to Achieve Goals: Good  Plan Discharge plan remains appropriate;Frequency remains appropriate       AM-PAC OT "6 Clicks" Daily Activity     Outcome Measure   Help from another person eating meals?: None Help from another person taking care of personal grooming?: None Help from another person toileting, which includes using toliet, bedpan, or urinal?: None Help from another person bathing (including washing, rinsing, drying)?: None Help from another person to put on and taking off regular upper body clothing?: None Help from another person to put on and taking off regular lower body clothing?: None 6 Click Score: 24    End of Session    OT Visit Diagnosis: Unsteadiness on feet (R26.81);Repeated falls (R29.6);Muscle weakness (generalized) (M62.81)   Activity Tolerance Patient tolerated treatment well   Patient Left with call bell/phone within reach;in chair;with chair alarm set   Nurse Communication Mobility status        Time: 8546-2703 OT Time Calculation (min): 21 min  Charges: OT General Charges $OT Visit: 1 Visit OT Treatments $Self Care/Home Management : 8-22 mins  Darleen Crocker, MS, OTR/L , CBIS ascom [phone]  10/16/22, 3:50 PM

## 2022-10-16 NOTE — Discharge Summary (Signed)
Physician Discharge Summary   Patient: Trevor [place] MRN: [id number]  DOB: 06-04-1961   Admit:     Date of Admission: 10/10/2022 Admitted from: home   Discharge: Date of discharge: 10/16/22 Disposition: Home health Condition at discharge: fair  CODE STATUS: FULL CODE      Discharge Physician: Emeterio Reeve, DO [facility]     PCP: Tracie Harrier, MD  Recommendations for Outpatient Follow-up:  Follow up with PCP Tracie Harrier, MD in 4 weeks Follow up with oncology team as directed  Please obtain labs/tests: per oncology  Please follow up on the following pending results: none    Discharge Instructions     Diet - low sodium heart healthy   Complete by: As directed    Increase activity slowly   Complete by: As directed          Discharge Diagnoses: Principal Problem:   Acute respiratory failure with hypoxia ([place]) Active Problems:   Cough   CAP (community acquired pneumonia)   Centrilobular emphysema ([place])   Tobacco abuse   Hyponatremia   Schizophrenia ([place])   Hypertension   Sepsis due to pneumonia ([place])   Lung mass   Palliative care encounter       Hospital Course: 62 year old gentleman with a history of hypertension, schizoaffective disorder admitted to the hospital with complaints of cough, congestion and generalized weakness found to have community-acquired pneumonia in the setting of new lung mass. Patient has been ill since about December 20, when he was seen by PCP treated with a course of prednisone and doxycycline after which she was feeling better, but after completing his antibiotic course started to feel worse again. After admission to the hospital, he was given IV hydralazine for elevated blood pressure, and went into asymptomatic rapid A-fib, which resolved after dose of metoprolol. He was admitted to the hospitalist service, and treatment was initiated with IV azithromycin and IV Rocephin. 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, starting palliative radiation and will need outpatient oncology f/u  Consultants:  Pulmonology  Medical Oncology Radiation Oncology   Procedures: Bronchoscopy 10/11/22 w/ biopsy       ASSESSMENT & PLAN:   Principal Problem:   Acute respiratory failure with hypoxia ([place]) Active Problems:   Cough   CAP (community acquired pneumonia)   Centrilobular emphysema ([place])   Tobacco abuse   Hyponatremia   Schizophrenia ([place])   Hypertension   Sepsis due to pneumonia ([place])   Lung mass  Acute respiratory failure with hypoxia-due to community-acquired pneumonia, left hilar lung mass which is highly suspicious of malignancy Community-acquired pneumonia, patient is not meeting sepsis criteria Continue empiric antibiotic therapy IV azithromycin and Rocephin for total 7 days Supplemental oxygen to keep O2 saturation above 90%, wean as tolerated Pulmonary toilet   Lung mass-patient with left-sided pulmonary nodule, as well as hilar mass, pathology favors small cell neuroendocrine carcinoma Radiation oncology plans palliative radiation this week He has had CT head (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  As initial workup is unremarkable, will involve cardiology only  if A-fib recurs; seems it was directly in response to IV hydralazine. Thyroid panel and 2D echo unremarkable   Hyponatremia - resolved -sodium 121 on arrival, patient without confusion, headache or other concerning symptoms; could be SIADH related to  his pneumonia/pulmonary mass, sodium level now significantly improved and stable without fluid infusion, continue to monitor, recheck sodium level outpatient   Hypomagnesemia-repleted as above, will recheck with morning labs   Hypertension-holding home lisinopril hydrochlorothiazide due to hyponatremia   Hyperlipidemia-continue home statin            Discharge Instructions  Allergies as of 10/16/2022   No Known Allergies      Medication List     TAKE these medications    albuterol 108 (90 Base) MCG/ACT inhaler Commonly known as: VENTOLIN HFA Inhale 2 puffs into the lungs every 6 (six) hours as needed for wheezing or shortness of breath.   benztropine 2 MG tablet Commonly known as: COGENTIN Take 2 mg by mouth 2 (two) times daily.   bisacodyl 5 MG EC tablet Commonly known as: DULCOLAX Take 1 tablet (5 mg total) by mouth daily as needed for moderate constipation.   docusate sodium 100 MG capsule Commonly known as: COLACE Take 1 capsule (100 mg total) by mouth 2 (two) times daily as needed for mild constipation.   fluPHENAZine 5 MG tablet Commonly known as: PROLIXIN Take 2.5 mg by mouth daily.   guaiFENesin 600 MG 12 hr tablet Commonly known as: MUCINEX Take 1 tablet (600 mg total) by mouth 2 (two) times daily as needed for cough.   lisinopril-hydrochlorothiazide 20-12.5 MG tablet Commonly known as: ZESTORETIC Take 1 tablet by mouth daily.   meloxicam 15 MG tablet Commonly known as: MOBIC Take 15 mg by mouth daily.   Multi-Vitamin tablet Take 1 tablet by mouth daily.   nicotine 21 mg/24hr patch Commonly known as: NICODERM CQ - dosed in mg/24 hours Place 21 mg onto the skin daily.   propranolol 10 MG tablet Commonly known as: INDERAL Take 0.5 tablets (5 mg total) by mouth 2 (two) times daily.   simvastatin 20 MG tablet Commonly known as: ZOCOR Take 20 mg by mouth at bedtime.   Vitamin E 400 units Tabs Take 400 Units by mouth daily.                Durable Medical Equipment  (From admission, onward)           Start     Ordered   10/16/22 1509  DME Oxygen  Once       Question Answer Comment  Length of Need 6 Months   Mode or (Route) Nasal cannula   Liters per Minute 2   Frequency Continuous (stationary and portable oxygen unit needed)   Oxygen delivery system Gas      10/16/22 1510              No Known Allergies   Subjective: pt has no concerns today, eager to go home if possible. Still some SOB on exertion    Discharge Exam: BP (!) 175/102 (BP Location: Left Arm)   Pulse 96   Temp 98 F (36.7 C)   Resp 17   Ht 5\' 8"  (1.727 m)   Wt 75 kg   SpO2 93%   BMI 25.14 kg/m  General: Pt is alert, awake, not in acute distress Cardiovascular: RRR, S1/S2 +, no rubs, no gallops Respiratory: CTA bilaterally, no wheezing, no rhonchi Abdominal: Soft, NT, ND, bowel sounds +  Extremities: no edema, no cyanosis     The results of significant diagnostics from this hospitalization (including imaging, microbiology, ancillary and laboratory) are listed below for reference.     Microbiology: Recent Results (from the past 240 hour(s))  Resp panel by RT-PCR (RSV, Flu A&B, Covid) Anterior Nasal Swab     Status: None   Collection Time: 10/10/22  5:30 PM   Specimen: Anterior Nasal Swab  Result Value Ref Range Status   SARS Coronavirus 2 by RT PCR NEGATIVE NEGATIVE Final    Comment: (NOTE) SARS-CoV-2 target nucleic acids are NOT DETECTED.  The SARS-CoV-2 RNA is generally detectable in upper respiratory specimens during the acute phase of infection. The lowest concentration of SARS-CoV-2 viral copies this assay can detect is 138 copies/mL. A negative result does not preclude SARS-Cov-2 infection and should not be used as the sole basis for treatment or other patient management decisions. A negative result may occur with  improper specimen collection/handling, submission of specimen other than nasopharyngeal  swab, presence of viral mutation(s) within the areas targeted by this assay, and inadequate number of viral copies(<138 copies/mL). A negative result must be combined with clinical observations, patient history, and epidemiological information. The expected result is Negative.  Fact Sheet for Patients:  EntrepreneurPulse.com.au  Fact Sheet for Healthcare Providers:  IncredibleEmployment.be  This test is no t yet approved or cleared by the [place] FDA and  has been authorized for detection and/or diagnosis of SARS-CoV-2 by FDA under an Emergency Use Authorization (EUA). This EUA will remain  in effect (meaning this test can be used) for the duration of the COVID-19 declaration under Section 564(b)(1) of the Act, 21 U.S.C.section 360bbb-3(b)(1), unless the authorization is terminated  or revoked sooner.       Influenza A by PCR NEGATIVE NEGATIVE Final   Influenza B by PCR NEGATIVE NEGATIVE Final    Comment: (NOTE) The Xpert Xpress SARS-CoV-2/FLU/RSV plus assay is intended as an aid in the diagnosis of influenza from Nasopharyngeal swab specimens and should not be used as a sole basis for treatment. Nasal washings and aspirates are unacceptable for Xpert Xpress SARS-CoV-2/FLU/RSV testing.  Fact Sheet for Patients: EntrepreneurPulse.com.au  Fact Sheet for Healthcare Providers: IncredibleEmployment.be  This test is not yet approved or cleared by the [place] FDA and has been authorized for detection and/or diagnosis of SARS-CoV-2 by FDA under an Emergency Use Authorization (EUA). This EUA will remain in effect (meaning this test can be used) for the duration of the COVID-19 declaration under Section 564(b)(1) of the Act, 21 U.S.C. section 360bbb-3(b)(1), unless the authorization is terminated or revoked.     Resp Syncytial Virus by PCR NEGATIVE NEGATIVE Final    Comment: (NOTE) Fact Sheet for  Patients: EntrepreneurPulse.com.au  Fact Sheet for Healthcare Providers: IncredibleEmployment.be  This test is not yet approved or cleared by the [place] FDA and has been authorized for detection and/or diagnosis of SARS-CoV-2 by FDA under an Emergency Use Authorization (EUA). This EUA will remain in effect (meaning this test can be used) for the duration of the COVID-19 declaration under Section 564(b)(1) of the Act, 21 U.S.C. section 360bbb-3(b)(1), unless the authorization is terminated or revoked.  Performed at [facility], [address] [place], [place] [address]   Culture, Respiratory w Gram Stain     Status: None   Collection Time: 10/11/22  4:59 PM   Specimen: Stump; Respiratory  Result Value Ref Range Status   Specimen Description   Final  STUMP Performed at [facility], [address] [place], [place] [address]    Special Requests   Final    NONE Performed at  [facility], [place], [place] [address]    Gram Stain   Final    MODERATE WBC PRESENT, PREDOMINANTLY PMN NO ORGANISMS SEEN    Culture   Final    RARE Normal respiratory flora-no Staph aureus or Pseudomonas seen Performed at [facility] [address] [place], [place] [address]    Report Status 10/14/2022 FINAL  Final     Labs: BNP (last 3 results) Recent Labs    10/10/22 1730  BNP 70.0   Basic Metabolic Panel: Recent Labs  Lab 10/10/22 2332 10/11/22 1302 10/12/22 0146 10/12/22 0928 10/13/22 0525 10/14/22 0437 10/15/22 0451 10/16/22 0434  NA  --    < > 129* 130* 130* 130* 131* 133*  K  --    < > 3.9 4.1 3.5 3.5 3.7 3.8  CL  --    < > 93* 90* 95* 92* 92* 96*  CO2  --    < > 27 29 28 30 30  32  GLUCOSE  --    < > 95 89 87 81 94 94  BUN  --    < > 12 12 12 6 9 11   CREATININE  --    < > 0.59* 0.59* 0.50* 0.38* 0.46* 0.50*  CALCIUM  --    < > 8.8* 9.0 8.2* 8.2* 8.5* 8.5*  MG 1.6*  --  1.9   --   --   --   --   --    < > = values in this interval not displayed.   Liver Function Tests: Recent Labs  Lab 10/10/22 1730  AST 28  ALT 23  ALKPHOS 56  BILITOT 0.8  PROT 6.8  ALBUMIN 3.9   No results for input(s): "LIPASE", "AMYLASE" in the last 168 hours. No results for input(s): "AMMONIA" in the last 168 hours. CBC: Recent Labs  Lab 10/12/22 0146 10/13/22 0525 10/14/22 0437 10/15/22 0451 10/16/22 0434  WBC 4.9 10.0 8.1 8.1 8.2  HGB 12.0* 11.8* 11.2* 12.2* 11.4*  HCT 35.9* 35.9* 34.1* 37.6* 36.0*  MCV 83.3 83.9 84.2 85.3 85.9  PLT 312 332 303 337 321   Cardiac Enzymes: No results for input(s): "CKTOTAL", "CKMB", "CKMBINDEX", "TROPONINI" in the last 168 hours. BNP: Invalid input(s): "POCBNP" CBG: No results for input(s): "GLUCAP" in the last 168 hours. D-Dimer No results for input(s): "DDIMER" in the last 72 hours. Hgb A1c No results for input(s): "HGBA1C" in the last 72 hours. Lipid Profile No results for input(s): "CHOL", "HDL", "LDLCALC", "TRIG", "CHOLHDL", "LDLDIRECT" in the last 72 hours. Thyroid function studies No results for input(s): "TSH", "T4TOTAL", "T3FREE", "THYROIDAB" in the last 72 hours.  Invalid input(s): "FREET3" Anemia work up No results for input(s): "VITAMINB12", "FOLATE", "FERRITIN", "TIBC", "IRON", "RETICCTPCT" in the last 72 hours. Urinalysis No results found for: "COLORURINE", "APPEARANCEUR", "LABSPEC", "PHURINE", "GLUCOSEU", "HGBUR", "BILIRUBINUR", "KETONESUR", "PROTEINUR", "UROBILINOGEN", "NITRITE", "LEUKOCYTESUR" Sepsis Labs Recent Labs  Lab 10/13/22 0525 10/14/22 0437 10/15/22 0451 10/16/22 0434  WBC 10.0 8.1 8.1 8.2   Microbiology Recent Results (from the past 240 hour(s))  Resp panel by RT-PCR (RSV, Flu A&B, Covid) Anterior Nasal Swab     Status: None   Collection Time: 10/10/22  5:30 PM   Specimen: Anterior Nasal Swab  Result Value Ref Range Status   SARS Coronavirus 2 by RT PCR  NEGATIVE NEGATIVE Final    Comment:  (NOTE) SARS-CoV-2 target nucleic acids are NOT DETECTED.  The SARS-CoV-2 RNA is generally detectable in upper respiratory specimens during the acute phase of infection. The lowest concentration of SARS-CoV-2 viral copies this assay can detect is 138 copies/mL. A negative result does not preclude SARS-Cov-2 infection and should not be used as the sole basis for treatment or other patient management decisions. A negative result may occur with  improper specimen collection/handling, submission of specimen other than nasopharyngeal swab, presence of viral mutation(s) within the areas targeted by this assay, and inadequate number of viral copies(<138 copies/mL). A negative result must be combined with clinical observations, patient history, and epidemiological information. The expected result is Negative.  Fact Sheet for Patients:  EntrepreneurPulse.com.au  Fact Sheet for Healthcare Providers:  IncredibleEmployment.be  This test is no t yet approved or cleared by the [place] FDA and  has been authorized for detection and/or diagnosis of SARS-CoV-2 by FDA under an Emergency Use Authorization (EUA). This EUA will remain  in effect (meaning this test can be used) for the duration of the COVID-19 declaration under Section 564(b)(1) of the Act, 21 U.S.C.section 360bbb-3(b)(1), unless the authorization is terminated  or revoked sooner.       Influenza A by PCR NEGATIVE NEGATIVE Final   Influenza B by PCR NEGATIVE NEGATIVE Final    Comment: (NOTE) The Xpert Xpress SARS-CoV-2/FLU/RSV plus assay is intended as an aid in the diagnosis of influenza from Nasopharyngeal swab specimens and should not be used as a sole basis for treatment. Nasal washings and aspirates are unacceptable for Xpert Xpress SARS-CoV-2/FLU/RSV testing.  Fact Sheet for Patients: EntrepreneurPulse.com.au  Fact Sheet for Healthcare  Providers: IncredibleEmployment.be  This test is not yet approved or cleared by the [place] FDA and has been authorized for detection and/or diagnosis of SARS-CoV-2 by FDA under an Emergency Use Authorization (EUA). This EUA will remain in effect (meaning this test can be used) for the duration of the COVID-19 declaration under Section 564(b)(1) of the Act, 21 U.S.C. section 360bbb-3(b)(1), unless the authorization is terminated or revoked.     Resp Syncytial Virus by PCR NEGATIVE NEGATIVE Final    Comment: (NOTE) Fact Sheet for Patients: EntrepreneurPulse.com.au  Fact Sheet for Healthcare Providers: IncredibleEmployment.be  This test is not yet approved or cleared by the [place] FDA and has been authorized for detection and/or diagnosis of SARS-CoV-2 by FDA under an Emergency Use Authorization (EUA). This EUA will remain in effect (meaning this test can be used) for the duration of the COVID-19 declaration under Section 564(b)(1) of the Act, 21 U.S.C. section 360bbb-3(b)(1), unless the authorization is terminated or revoked.  Performed at [facility], [address] [place], [place] [address]   Culture, Respiratory w Gram Stain     Status: None   Collection Time: 10/11/22  4:59 PM   Specimen: Stump; Respiratory  Result Value Ref Range Status   Specimen Description   Final    STUMP Performed at [facility], [address], [place], [place] [address]    Special Requests   Final    NONE Performed at [facility], [address] [place], [place] [address]    Gram Stain   Final    MODERATE WBC PRESENT, PREDOMINANTLY PMN NO ORGANISMS SEEN    Culture   Final    RARE Normal respiratory flora-no Staph aureus or Pseudomonas seen Performed at [facility] [address]  [place], [place] [address]    Report Status 10/14/2022 FINAL  Final   Imaging CT HEAD W & WO  CONTRAST (5MM)  Result Date: 10/11/2022 CLINICAL DATA:  Metastatic disease evaluation EXAM: CT HEAD WITHOUT AND WITH CONTRAST TECHNIQUE: Contiguous axial images were obtained from the base of the skull through the vertex without and with intravenous contrast. RADIATION DOSE REDUCTION: This exam was performed according to the departmental dose-optimization program which includes automated exposure control, adjustment of the mA and/or kV according to patient size and/or use of iterative reconstruction technique. CONTRAST:  13mL OMNIPAQUE IOHEXOL 350 MG/ML SOLN COMPARISON:  None Available. FINDINGS: Brain: There is no mass, hemorrhage or extra-axial collection. The size and configuration of the ventricles and extra-axial CSF spaces are normal. The brain parenchyma is normal, without acute or chronic infarction. No abnormal enhancement. Vascular: No abnormal hyperdensity of the major intracranial arteries or dural venous sinuses. No intracranial atherosclerosis. Skull: The visualized skull base, calvarium and extracranial soft tissues are normal. Sinuses/Orbits: No fluid levels or advanced mucosal thickening of the visualized paranasal sinuses. No mastoid or middle ear effusion. The orbits are normal. IMPRESSION: Normal head CT. Electronically Signed   By: Ulyses Jarred M.D.   On: 10/11/2022 23:08   CT ABDOMEN PELVIS W CONTRAST  Result Date: 10/11/2022 CLINICAL DATA:  Metastatic disease evaluation EXAM: CT ABDOMEN AND PELVIS WITH CONTRAST TECHNIQUE: Multidetector CT imaging of the abdomen and pelvis was performed using the standard protocol following bolus administration of intravenous contrast. RADIATION DOSE REDUCTION: This exam was performed according to the departmental dose-optimization program which includes automated exposure control, adjustment of the mA and/or kV according to patient size and/or use of iterative reconstruction technique. CONTRAST:  64mL OMNIPAQUE IOHEXOL 350 MG/ML SOLN COMPARISON:  CT  angiogram chest 10/11/2022 FINDINGS: Lower chest: There is atelectasis in the lung bases. Left lower lobe mass is partially imaged. Please see dedicated CT chest performed same day. Hepatobiliary: There are 3 hypodensities in the left lobe of the liver measuring up to 15 mm, indeterminate given chest findings. There is no biliary ductal dilatation. The gallbladder is within normal limits. Pancreas: Unremarkable. No pancreatic ductal dilatation or surrounding inflammatory changes. Spleen: Normal in size without focal abnormality. Adrenals/Urinary Tract: Bilateral renal cortical cysts are present measuring up to 3.5 cm. There is no hydronephrosis or perinephric fat stranding. The adrenal glands and bladder are within normal limits. The bladder is distended. Stomach/Bowel: There is a small hiatal hernia. Stomach is otherwise within normal limits. Appendix is not seen. No evidence of bowel wall thickening, distention, or inflammatory changes. There is sigmoid colon diverticulosis. Vascular/Lymphatic: No significant vascular findings are present. No enlarged abdominal or pelvic lymph nodes. Reproductive: Prostate gland is mildly enlarged. Other: No abdominal wall hernia or abnormality. No abdominopelvic ascites. Musculoskeletal: No acute or significant osseous findings. IMPRESSION: 1. Three hypodense lesions in the left lobe of the liver measuring up to 15 mm, indeterminate. These may represent cysts or hemangiomas, but other etiologies such as metastatic disease are not excluded given findings in the chest. Consider further evaluation with MRI. 2. No other evidence for metastatic disease in the abdomen or pelvis. 3. Small hiatal hernia. 4. Sigmoid colon diverticulosis. 5. Bilateral renal cysts.  No follow-up necessary. Electronically Signed   By: Ronney Asters M.D.   On: 10/11/2022 21:37   DG Chest Port 1 View  Result Date: 10/11/2022 CLINICAL DATA:  Post bronchoscopy with biopsy EXAM: PORTABLE CHEST 1 VIEW  COMPARISON:  Portable exam 1704 hours compared  to CT chest 10/11/2022 FINDINGS: Normal heart size and pulmonary vascularity. Again identified LEFT hilar enlargement/adenopathy and LEFT perihilar mass. Minimal infiltrate in LEFT lung adjacent to mass. Remaining lungs clear. No pleural effusion or pneumothorax. IMPRESSION: No pneumothorax following bronchoscopy. LEFT hilar adenopathy and LEFT perihilar mass again identified with mild LEFT perihilar edema postprocedure. Electronically Signed   By: Lavonia Dana M.D.   On: 10/11/2022 17:23   ECHOCARDIOGRAM COMPLETE  Result Date: 10/11/2022    ECHOCARDIOGRAM REPORT   Patient Name:   [facility] Zielinski Date of Exam: 10/11/2022 Medical Rec #:  [id number]  Height:       68.0 in Accession #:    [id number] Weight:       165.3 lb Date of Birth:  Apr 17, 1961   BSA:          1.885 m Patient Age:    76 years   BP:           134/80 mmHg Patient Gender: M          HR:           87 bpm. Exam Location:  [facility] Procedure: 2D Echo, Cardiac Doppler and Color Doppler Indications:     Atrial Fibrillation I48.91  History:         Patient has no prior history of Echocardiogram examinations.                  Risk Factors:Hypertension and Dyslipidemia.  Sonographer:     Sherrie Sport Referring Phys:  [id number] [place] Diagnosing Phys: Ida Rogue MD  Sonographer Comments: Image quality was fair. IMPRESSIONS  1. Left ventricular ejection fraction, by estimation, is 60 to 65%. The left ventricle has normal function. The left ventricle has no regional wall motion abnormalities. Left ventricular diastolic parameters are consistent with Grade I diastolic dysfunction (impaired relaxation).  2. Right ventricular systolic function is normal. The right ventricular size is normal.  3. The mitral valve is normal in structure. No evidence of mitral valve regurgitation. No evidence of mitral stenosis.  4. The aortic valve is normal in structure. Aortic valve regurgitation is not visualized. No aortic  stenosis is present.  5. There is borderline dilatation of the aortic root, measuring 37 mm.  6. The inferior vena cava is normal in size with greater than 50% respiratory variability, suggesting right atrial pressure of 3 mmHg. FINDINGS  Left Ventricle: Left ventricular ejection fraction, by estimation, is 60 to 65%. The left ventricle has normal function. The left ventricle has no regional wall motion abnormalities. The left ventricular internal cavity size was normal in size. There is  no left ventricular hypertrophy. Left ventricular diastolic parameters are consistent with Grade I diastolic dysfunction (impaired relaxation). Right Ventricle: The right ventricular size is normal. No increase in right ventricular wall thickness. Right ventricular systolic function is normal. Left Atrium: Left atrial size was normal in size. Right Atrium: Right atrial size was normal in size. Pericardium: There is no evidence of pericardial effusion. Mitral Valve: The mitral valve is normal in structure. No evidence of mitral valve regurgitation. No evidence of mitral valve stenosis. Tricuspid Valve: The tricuspid valve is normal in structure. Tricuspid valve regurgitation is mild . No evidence of tricuspid stenosis. Aortic Valve: The aortic valve is normal in structure. Aortic valve regurgitation is not visualized. No aortic stenosis is present. Aortic valve mean gradient measures 4.0 mmHg. Aortic valve peak gradient measures 6.2 mmHg. Aortic valve area, by VTI measures 3.33 cm. Pulmonic Valve:  The pulmonic valve was normal in structure. Pulmonic valve regurgitation is not visualized. No evidence of pulmonic stenosis. Aorta: The aortic root is normal in size and structure. There is borderline dilatation of the aortic root, measuring 37 mm. Venous: The inferior vena cava is normal in size with greater than 50% respiratory variability, suggesting right atrial pressure of 3 mmHg. IAS/Shunts: No atrial level shunt detected by color  flow Doppler.  LEFT VENTRICLE PLAX 2D LVIDd:         3.70 cm   Diastology LVIDs:         2.50 cm   LV e' medial:    9.14 cm/s LV PW:         1.10 cm   LV E/e' medial:  9.5 LV IVS:        1.00 cm   LV e' lateral:   8.49 cm/s LVOT diam:     2.00 cm   LV E/e' lateral: 10.2 LV SV:         84 LV SV Index:   45 LVOT Area:     3.14 cm  RIGHT VENTRICLE RV Basal diam:  3.90 cm RV Mid diam:    4.10 cm RV S prime:     16.50 cm/s TAPSE (M-mode): 2.7 cm LEFT ATRIUM           Index        RIGHT ATRIUM           Index LA diam:      2.20 cm 1.17 cm/m   RA Area:     11.50 cm LA Vol (A2C): 63.7 ml 33.79 ml/m  RA Volume:   24.40 ml  12.94 ml/m LA Vol (A4C): 25.5 ml 13.53 ml/m  AORTIC VALVE AV Area (Vmax):    3.44 cm AV Area (Vmean):   3.40 cm AV Area (VTI):     3.33 cm AV Vmax:           125.00 cm/s AV Vmean:          95.200 cm/s AV VTI:            0.253 m AV Peak Grad:      6.2 mmHg AV Mean Grad:      4.0 mmHg LVOT Vmax:         137.00 cm/s LVOT Vmean:        103.000 cm/s LVOT VTI:          0.268 m LVOT/AV VTI ratio: 1.06  AORTA Ao Root diam: 3.70 cm MITRAL VALVE               TRICUSPID VALVE MV Area (PHT): 5.23 cm    TR Peak grad:   19.9 mmHg MV Decel Time: 145 msec    TR Vmax:        223.00 cm/s MV E velocity: 86.50 cm/s MV A velocity: 91.70 cm/s  SHUNTS MV E/A ratio:  0.94        Systemic VTI:  0.27 m                            Systemic Diam: 2.00 cm Ida Rogue MD Electronically signed by Ida Rogue MD Signature Date/Time: 10/11/2022/5:11:44 PM    Final    CT Angio Chest Pulmonary Embolism (PE) W or WO Contrast  Result Date: 10/11/2022 CLINICAL DATA:  Pulmonary embolism (PE) suspected, high prob EXAM: CT ANGIOGRAPHY CHEST WITH CONTRAST TECHNIQUE: Multidetector CT imaging of  the chest was performed using the standard protocol during bolus administration of intravenous contrast. Multiplanar CT image reconstructions and MIPs were obtained to evaluate the vascular anatomy. RADIATION DOSE REDUCTION: This exam was  performed according to the departmental dose-optimization program which includes automated exposure control, adjustment of the mA and/or kV according to patient size and/or use of iterative reconstruction technique. CONTRAST:  20mL OMNIPAQUE IOHEXOL 350 MG/ML SOLN COMPARISON:  Chest x-ray 10/10/2022, CT chest 12/12/2008 FINDINGS: Cardiovascular: Satisfactory opacification of the pulmonary arteries to the segmental level. No evidence of pulmonary embolism. Limited evaluation of the subsegmental level due to motion artifact. Marked narrowing of the left pulmonary arteries due to hilar mass. The main pulmonary artery is normal in caliber. Normal heart size. No significant pericardial effusion. The thoracic aorta is normal in caliber. No atherosclerotic plaque of the thoracic aorta. No coronary artery calcifications. Mediastinum/Nodes: There is a at least 8.6 x 5.5 x 7.5 cm mass with irregular borders centered within the left hilar region with associated encasement of the left main pulmonary artery as well as the origin of all of the left segmental pulmonary arteries. Associated narrowing of these arteries with almost complete occlusion (less than 1 mm opacification) of the left upper lobe pulmonary artery. The mass is also noted to encase the left or upper and lower lobe bronchi (4:78). Associated occlusion of the right upper lobe bronchi as well as internal debris. Left hilar mass versus conglomerate of lymphadenopathy extensive the pre-vascular region and middle of the mediastinum. Associated prevascular lymph node measuring 1 point 5 cm. As well as a 1.1 cm precarinal lymph node (4:69). No enlarged right hilar or axillary lymph nodes. Thyroid gland, trachea, and esophagus demonstrate no significant findings. Small hiatal hernia. Lungs/Pleura: Limited evaluation due to motion artifact. At least moderate centrilobular emphysematous changes. Diffuse bronchial wall thickening. Left upper lobe 2.4 x 1.2 cm pulmonary  nodule (5:78). Left lower lobe spiculated 1.8 x 0.9 cm pulmonary nodule (4:52). Likely several satellite micronodules within the left upper lobe (4:68). Question tree-in-bud nodularity of the left lower lobe. No pleural effusion. No pneumothorax. Upper Abdomen: Left renal fluid dense lesions-likely simple renal cysts. Simple renal cysts, in the absence of clinically indicated signs/symptoms, require no independent follow-up. Musculoskeletal: No chest wall abnormality. No suspicious lytic or blastic osseous lesions. No acute displaced fracture. Multilevel degenerative changes of the spine. Review of the MIP images confirms the above findings. IMPRESSION: 1. No evidence of pulmonary embolism. Limited evaluation of the subsegmental level due to motion artifact. 2. Left upper lobe 2.4 x 1.2 cm pulmonary nodule. Left lower lobe spiculated 1.8 x 0.9 cm pulmonary nodule. 3. At least 8.6 x 5.5 x 7.5 cm mass/conglomeraive lymphadenopathy centered within the left hilar region with associated encasement of the left main pulmonary artery, origin of all of the left segmental pulmonary arteries, left upper lobe and lower lobe bronchi. Mediastinal lymphadenopathy. 4. Associated marked narrowing of the left pulmonary arteries due to hilar mass. Almost complete occlusion (less than 1 mm opacification) of the left upper lobe pulmonary artery. 5. Associated occlusion of the right upper lobe bronchi as well as internal debris. 6. Question tree-in-bud nodularity of the left lower lobe with limited evaluation on this noncontrast study. This could represent infection/inflammation. 7. Small hiatal hernia. 8. Aortic Atherosclerosis (ICD10-I70.0) and Emphysema (ICD10-J43.9). 9. Additional imaging evaluation or consultation with Pulmonology or Thoracic Surgery recommended. Electronically Signed   By: Iven Finn M.D.   On: 10/11/2022 01:09  Time coordinating discharge: over 30 minutes  SIGNED:  Emeterio Reeve DO [facility]

## 2022-10-16 NOTE — Progress Notes (Signed)
O2 sat at rest on room air: 90% Test with exertion:  O2 sat at exertion on room air: 86%, then  O2 sat at exertion on 2 L/min of O2: 93%.

## 2022-10-16 NOTE — TOC Progression Note (Signed)
Transition of Care [facility]) - Progression Note    Patient Details  Name: Trevor Montgomery MRN: [id number] Date of Birth: [birth year]  Transition of Care [facility]) CM/SW [place], RN Phone Number: 10/16/2022, 3:18 PM  Clinical Narrative:   Patient in the room with his sister, they need [place] will deliver, [place] Pt set up with Corene Cornea at [facility]   Expected Discharge Plan: [place] Barriers to Discharge: No Barriers Identified  Expected Discharge Plan and Services   Discharge Planning Services: CM Consult   Living arrangements for the past 2 months: Single Family Home Expected Discharge Date: 10/16/22               DME Arranged: Oxygen         HH Arranged: PT HH Agency: [place] ([place]) Date HH Agency Contacted: 10/16/22 Time [place]: [place] Representative spoke with at [place]: [place] Determinants of Health ([place]) Interventions SDOH Screenings   Food Insecurity: No Food Insecurity (10/10/2022)  Housing: Low Risk  (10/10/2022)  Transportation Needs: No Transportation Needs (10/10/2022)  Utilities: Not At Risk (10/10/2022)  Tobacco Use: High Risk (10/14/2022)    Readmission Risk Interventions     No data to display

## 2022-10-16 NOTE — Progress Notes (Signed)
[place]  Telephone:([phone] [phone]  ID: Sherilyn Banker OB: 08-22-1961  MR#: [id number]  YHC#:[id number]  Patient Care Team: Tracie Harrier, MD as PCP - General (Internal Medicine)  CHIEF COMPLAINT: Small cell carcinoma of the lung  INTERVAL HISTORY: Patient doing well today.  Continues to have shortness of breath and an oxygen requirement.  XRT will start tomorrow.  Patient offers no further complaints today.  REVIEW OF SYSTEMS:   Review of Systems  Constitutional:  Positive for malaise/fatigue. Negative for fever and weight loss.  Respiratory:  Positive for shortness of breath. Negative for cough and hemoptysis.   Cardiovascular: Negative.  Negative for chest pain and leg swelling.  Gastrointestinal: Negative.  Negative for abdominal pain.  Genitourinary: Negative.  Negative for dysuria.  Musculoskeletal:  Negative for back pain.  Skin: Negative.  Negative for rash.  Neurological: Negative.  Negative for dizziness, focal weakness, weakness and headaches.  Psychiatric/Behavioral: Negative.  The patient is not nervous/anxious.     As per HPI. Otherwise, a complete review of systems is negative.  PAST MEDICAL HISTORY: Past Medical History:  Diagnosis Date   HTN (hypertension)    Hyperlipemia    Schizoaffective disorder, bipolar type ([place])     PAST SURGICAL HISTORY: Past Surgical History:  Procedure Laterality Date   FINGER SURGERY     VIDEO BRONCHOSCOPY WITH ENDOBRONCHIAL ULTRASOUND Left 10/11/2022   Procedure: VIDEO BRONCHOSCOPY WITH ENDOBRONCHIAL ULTRASOUND;  Surgeon: Armando Reichert, MD;  Location: [facility] ORS;  Service: Pulmonary;  Laterality: Left;    FAMILY HISTORY: History reviewed. No pertinent family history.  ADVANCED DIRECTIVES (Y/N):  @ADVDIR @  HEALTH MAINTENANCE: Social History   Tobacco Use   Smoking status: Every Day  Substance Use Topics   Alcohol use: No   Drug use: No     Colonoscopy:  PAP:  Bone  density:  Lipid panel:  No Known Allergies  No current facility-administered medications for this encounter.   Current Outpatient Medications  Medication Sig Dispense Refill   albuterol (VENTOLIN HFA) 108 (90 Base) MCG/ACT inhaler Inhale 2 puffs into the lungs every 6 (six) hours as needed for wheezing or shortness of breath.     amLODipine (NORVASC) 10 MG tablet Take 1 tablet (10 mg total) by mouth daily. 30 tablet 0   benztropine (COGENTIN) 2 MG tablet Take 2 mg by mouth 2 (two) times daily.     fluPHENAZine (PROLIXIN) 5 MG tablet Take 2.5 mg by mouth daily.     meloxicam (MOBIC) 15 MG tablet Take 15 mg by mouth daily.     Multiple Vitamin (MULTI-VITAMIN) tablet Take 1 tablet by mouth daily.     nicotine (NICODERM CQ - DOSED IN MG/24 HOURS) 21 mg/24hr patch Place 21 mg onto the skin daily.     simvastatin (ZOCOR) 20 MG tablet Take 20 mg by mouth at bedtime.     Vitamin E 400 units TABS Take 400 Units by mouth daily.     bisacodyl (DULCOLAX) 5 MG EC tablet Take 1 tablet (5 mg total) by mouth daily as needed for moderate constipation. 30 tablet 0   docusate sodium (COLACE) 100 MG capsule Take 1 capsule (100 mg total) by mouth 2 (two) times daily as needed for mild constipation. 10 capsule 0   guaiFENesin (MUCINEX) 600 MG 12 hr tablet Take 1 tablet (600 mg total) by mouth 2 (two) times daily as needed for cough. 30 tablet 0   propranolol (INDERAL) 10 MG tablet Take 0.5 tablets (5 mg  total) by mouth 2 (two) times daily. 60 tablet 0    OBJECTIVE: Vitals:   10/16/22 0750 10/16/22 1618  BP:  120/81  Pulse:  85  Resp:  17  Temp:  98 F (36.7 C)  SpO2: 93% 97%     Body mass index is 25.14 kg/m.    ECOG FS:1 - Symptomatic but completely ambulatory  General: Well-developed, well-nourished, no acute distress. Eyes: Pink conjunctiva, anicteric sclera. HEENT: Normocephalic, moist mucous membranes. Lungs: No audible wheezing or coughing. Heart: Regular rate and rhythm. Abdomen: Soft,  nontender, no obvious distention. Musculoskeletal: No edema, cyanosis, or clubbing. Neuro: Alert, answering all questions appropriately. Cranial nerves grossly intact. Skin: No rashes or petechiae noted. Psych: Normal affect.   LAB RESULTS:  Lab Results  Component Value Date   NA 133 (L) 10/16/2022   K 3.8 10/16/2022   CL 96 (L) 10/16/2022   CO2 32 10/16/2022   GLUCOSE 94 10/16/2022   BUN 11 10/16/2022   CREATININE 0.50 (L) 10/16/2022   CALCIUM 8.5 (L) 10/16/2022   PROT 6.8 10/10/2022   ALBUMIN 3.9 10/10/2022   AST 28 10/10/2022   ALT 23 10/10/2022   ALKPHOS 56 10/10/2022   BILITOT 0.8 10/10/2022   GFRNONAA >60 10/16/2022    Lab Results  Component Value Date   WBC 8.2 10/16/2022   HGB 11.4 (L) 10/16/2022   HCT 36.0 (L) 10/16/2022   MCV 85.9 10/16/2022   PLT 321 10/16/2022     STUDIES: CT HEAD W & WO CONTRAST (5MM)  Result Date: 10/11/2022 CLINICAL DATA:  Metastatic disease evaluation EXAM: CT HEAD WITHOUT AND WITH CONTRAST TECHNIQUE: Contiguous axial images were obtained from the base of the skull through the vertex without and with intravenous contrast. RADIATION DOSE REDUCTION: This exam was performed according to the departmental dose-optimization program which includes automated exposure control, adjustment of the mA and/or kV according to patient size and/or use of iterative reconstruction technique. CONTRAST:  28mL OMNIPAQUE IOHEXOL 350 MG/ML SOLN COMPARISON:  None Available. FINDINGS: Brain: There is no mass, hemorrhage or extra-axial collection. The size and configuration of the ventricles and extra-axial CSF spaces are normal. The brain parenchyma is normal, without acute or chronic infarction. No abnormal enhancement. Vascular: No abnormal hyperdensity of the major intracranial arteries or dural venous sinuses. No intracranial atherosclerosis. Skull: The visualized skull base, calvarium and extracranial soft tissues are normal. Sinuses/Orbits: No fluid levels or  advanced mucosal thickening of the visualized paranasal sinuses. No mastoid or middle ear effusion. The orbits are normal. IMPRESSION: Normal head CT. Electronically Signed   By: Ulyses Jarred M.D.   On: 10/11/2022 23:08   CT ABDOMEN PELVIS W CONTRAST  Result Date: 10/11/2022 CLINICAL DATA:  Metastatic disease evaluation EXAM: CT ABDOMEN AND PELVIS WITH CONTRAST TECHNIQUE: Multidetector CT imaging of the abdomen and pelvis was performed using the standard protocol following bolus administration of intravenous contrast. RADIATION DOSE REDUCTION: This exam was performed according to the departmental dose-optimization program which includes automated exposure control, adjustment of the mA and/or kV according to patient size and/or use of iterative reconstruction technique. CONTRAST:  22mL OMNIPAQUE IOHEXOL 350 MG/ML SOLN COMPARISON:  CT angiogram chest 10/11/2022 FINDINGS: Lower chest: There is atelectasis in the lung bases. Left lower lobe mass is partially imaged. Please see dedicated CT chest performed same day. Hepatobiliary: There are 3 hypodensities in the left lobe of the liver measuring up to 15 mm, indeterminate given chest findings. There is no biliary ductal dilatation. The gallbladder is within  normal limits. Pancreas: Unremarkable. No pancreatic ductal dilatation or surrounding inflammatory changes. Spleen: Normal in size without focal abnormality. Adrenals/Urinary Tract: Bilateral renal cortical cysts are present measuring up to 3.5 cm. There is no hydronephrosis or perinephric fat stranding. The adrenal glands and bladder are within normal limits. The bladder is distended. Stomach/Bowel: There is a small hiatal hernia. Stomach is otherwise within normal limits. Appendix is not seen. No evidence of bowel wall thickening, distention, or inflammatory changes. There is sigmoid colon diverticulosis. Vascular/Lymphatic: No significant vascular findings are present. No enlarged abdominal or pelvic lymph  nodes. Reproductive: Prostate gland is mildly enlarged. Other: No abdominal wall hernia or abnormality. No abdominopelvic ascites. Musculoskeletal: No acute or significant osseous findings. IMPRESSION: 1. Three hypodense lesions in the left lobe of the liver measuring up to 15 mm, indeterminate. These may represent cysts or hemangiomas, but other etiologies such as metastatic disease are not excluded given findings in the chest. Consider further evaluation with MRI. 2. No other evidence for metastatic disease in the abdomen or pelvis. 3. Small hiatal hernia. 4. Sigmoid colon diverticulosis. 5. Bilateral renal cysts.  No follow-up necessary. Electronically Signed   By: Ronney Asters M.D.   On: 10/11/2022 21:37   DG Chest Port 1 View  Result Date: 10/11/2022 CLINICAL DATA:  Post bronchoscopy with biopsy EXAM: PORTABLE CHEST 1 VIEW COMPARISON:  Portable exam 1704 hours compared to CT chest 10/11/2022 FINDINGS: Normal heart size and pulmonary vascularity. Again identified LEFT hilar enlargement/adenopathy and LEFT perihilar mass. Minimal infiltrate in LEFT lung adjacent to mass. Remaining lungs clear. No pleural effusion or pneumothorax. IMPRESSION: No pneumothorax following bronchoscopy. LEFT hilar adenopathy and LEFT perihilar mass again identified with mild LEFT perihilar edema postprocedure. Electronically Signed   By: Lavonia Dana M.D.   On: 10/11/2022 17:23   ECHOCARDIOGRAM COMPLETE  Result Date: 10/11/2022    ECHOCARDIOGRAM REPORT   Patient Name:   [facility] Date of Exam: 10/11/2022 Medical Rec #:  [id number]  Height:       68.0 in Accession #:    [id number] Weight:       165.3 lb Date of Birth:  01-20-1961   BSA:          1.885 m Patient Age:    62 years   BP:           134/80 mmHg Patient Gender: M          HR:           87 bpm. Exam Location:  [facility] Procedure: 2D Echo, Cardiac Doppler and Color Doppler Indications:     Atrial Fibrillation I48.91  History:         Patient has no prior history of  Echocardiogram examinations.                  Risk Factors:Hypertension and Dyslipidemia.  Sonographer:     Sherrie Sport Referring Phys:  [id number] [place] Diagnosing Phys: Ida Rogue MD  Sonographer Comments: Image quality was fair. IMPRESSIONS  1. Left ventricular ejection fraction, by estimation, is 60 to 65%. The left ventricle has normal function. The left ventricle has no regional wall motion abnormalities. Left ventricular diastolic parameters are consistent with Grade I diastolic dysfunction (impaired relaxation).  2. Right ventricular systolic function is normal. The right ventricular size is normal.  3. The mitral valve is normal in structure. No evidence of mitral valve regurgitation. No evidence of mitral stenosis.  4. The aortic valve is normal in  structure. Aortic valve regurgitation is not visualized. No aortic stenosis is present.  5. There is borderline dilatation of the aortic root, measuring 37 mm.  6. The inferior vena cava is normal in size with greater than 50% respiratory variability, suggesting right atrial pressure of 3 mmHg. FINDINGS  Left Ventricle: Left ventricular ejection fraction, by estimation, is 60 to 65%. The left ventricle has normal function. The left ventricle has no regional wall motion abnormalities. The left ventricular internal cavity size was normal in size. There is  no left ventricular hypertrophy. Left ventricular diastolic parameters are consistent with Grade I diastolic dysfunction (impaired relaxation). Right Ventricle: The right ventricular size is normal. No increase in right ventricular wall thickness. Right ventricular systolic function is normal. Left Atrium: Left atrial size was normal in size. Right Atrium: Right atrial size was normal in size. Pericardium: There is no evidence of pericardial effusion. Mitral Valve: The mitral valve is normal in structure. No evidence of mitral valve regurgitation. No evidence of mitral valve stenosis. Tricuspid  Valve: The tricuspid valve is normal in structure. Tricuspid valve regurgitation is mild . No evidence of tricuspid stenosis. Aortic Valve: The aortic valve is normal in structure. Aortic valve regurgitation is not visualized. No aortic stenosis is present. Aortic valve mean gradient measures 4.0 mmHg. Aortic valve peak gradient measures 6.2 mmHg. Aortic valve area, by VTI measures 3.33 cm. Pulmonic Valve: The pulmonic valve was normal in structure. Pulmonic valve regurgitation is not visualized. No evidence of pulmonic stenosis. Aorta: The aortic root is normal in size and structure. There is borderline dilatation of the aortic root, measuring 37 mm. Venous: The inferior vena cava is normal in size with greater than 50% respiratory variability, suggesting right atrial pressure of 3 mmHg. IAS/Shunts: No atrial level shunt detected by color flow Doppler.  LEFT VENTRICLE PLAX 2D LVIDd:         3.70 cm   Diastology LVIDs:         2.50 cm   LV e' medial:    9.14 cm/s LV PW:         1.10 cm   LV E/e' medial:  9.5 LV IVS:        1.00 cm   LV e' lateral:   8.49 cm/s LVOT diam:     2.00 cm   LV E/e' lateral: 10.2 LV SV:         84 LV SV Index:   45 LVOT Area:     3.14 cm  RIGHT VENTRICLE RV Basal diam:  3.90 cm RV Mid diam:    4.10 cm RV S prime:     16.50 cm/s TAPSE (M-mode): 2.7 cm LEFT ATRIUM           Index        RIGHT ATRIUM           Index LA diam:      2.20 cm 1.17 cm/m   RA Area:     11.50 cm LA Vol (A2C): 63.7 ml 33.79 ml/m  RA Volume:   24.40 ml  12.94 ml/m LA Vol (A4C): 25.5 ml 13.53 ml/m  AORTIC VALVE AV Area (Vmax):    3.44 cm AV Area (Vmean):   3.40 cm AV Area (VTI):     3.33 cm AV Vmax:           125.00 cm/s AV Vmean:          95.200 cm/s AV VTI:  0.253 m AV Peak Grad:      6.2 mmHg AV Mean Grad:      4.0 mmHg LVOT Vmax:         137.00 cm/s LVOT Vmean:        103.000 cm/s LVOT VTI:          0.268 m LVOT/AV VTI ratio: 1.06  AORTA Ao Root diam: 3.70 cm MITRAL VALVE               TRICUSPID  VALVE MV Area (PHT): 5.23 cm    TR Peak grad:   19.9 mmHg MV Decel Time: 145 msec    TR Vmax:        223.00 cm/s MV E velocity: 86.50 cm/s MV A velocity: 91.70 cm/s  SHUNTS MV E/A ratio:  0.94        Systemic VTI:  0.27 m                            Systemic Diam: 2.00 cm Ida Rogue MD Electronically signed by Ida Rogue MD Signature Date/Time: 10/11/2022/5:11:44 PM    Final    CT Angio Chest Pulmonary Embolism (PE) W or WO Contrast  Result Date: 10/11/2022 CLINICAL DATA:  Pulmonary embolism (PE) suspected, high prob EXAM: CT ANGIOGRAPHY CHEST WITH CONTRAST TECHNIQUE: Multidetector CT imaging of the chest was performed using the standard protocol during bolus administration of intravenous contrast. Multiplanar CT image reconstructions and MIPs were obtained to evaluate the vascular anatomy. RADIATION DOSE REDUCTION: This exam was performed according to the departmental dose-optimization program which includes automated exposure control, adjustment of the mA and/or kV according to patient size and/or use of iterative reconstruction technique. CONTRAST:  3mL OMNIPAQUE IOHEXOL 350 MG/ML SOLN COMPARISON:  Chest x-ray 10/10/2022, CT chest 12/12/2008 FINDINGS: Cardiovascular: Satisfactory opacification of the pulmonary arteries to the segmental level. No evidence of pulmonary embolism. Limited evaluation of the subsegmental level due to motion artifact. Marked narrowing of the left pulmonary arteries due to hilar mass. The main pulmonary artery is normal in caliber. Normal heart size. No significant pericardial effusion. The thoracic aorta is normal in caliber. No atherosclerotic plaque of the thoracic aorta. No coronary artery calcifications. Mediastinum/Nodes: There is a at least 8.6 x 5.5 x 7.5 cm mass with irregular borders centered within the left hilar region with associated encasement of the left main pulmonary artery as well as the origin of all of the left segmental pulmonary arteries. Associated  narrowing of these arteries with almost complete occlusion (less than 1 mm opacification) of the left upper lobe pulmonary artery. The mass is also noted to encase the left or upper and lower lobe bronchi (4:78). Associated occlusion of the right upper lobe bronchi as well as internal debris. Left hilar mass versus conglomerate of lymphadenopathy extensive the pre-vascular region and middle of the mediastinum. Associated prevascular lymph node measuring 1 point 5 cm. As well as a 1.1 cm precarinal lymph node (4:69). No enlarged right hilar or axillary lymph nodes. Thyroid gland, trachea, and esophagus demonstrate no significant findings. Small hiatal hernia. Lungs/Pleura: Limited evaluation due to motion artifact. At least moderate centrilobular emphysematous changes. Diffuse bronchial wall thickening. Left upper lobe 2.4 x 1.2 cm pulmonary nodule (5:78). Left lower lobe spiculated 1.8 x 0.9 cm pulmonary nodule (4:52). Likely several satellite micronodules within the left upper lobe (4:68). Question tree-in-bud nodularity of the left lower lobe. No pleural effusion. No pneumothorax. Upper Abdomen: Left renal fluid  dense lesions-likely simple renal cysts. Simple renal cysts, in the absence of clinically indicated signs/symptoms, require no independent follow-up. Musculoskeletal: No chest wall abnormality. No suspicious lytic or blastic osseous lesions. No acute displaced fracture. Multilevel degenerative changes of the spine. Review of the MIP images confirms the above findings. IMPRESSION: 1. No evidence of pulmonary embolism. Limited evaluation of the subsegmental level due to motion artifact. 2. Left upper lobe 2.4 x 1.2 cm pulmonary nodule. Left lower lobe spiculated 1.8 x 0.9 cm pulmonary nodule. 3. At least 8.6 x 5.5 x 7.5 cm mass/conglomeraive lymphadenopathy centered within the left hilar region with associated encasement of the left main pulmonary artery, origin of all of the left segmental pulmonary  arteries, left upper lobe and lower lobe bronchi. Mediastinal lymphadenopathy. 4. Associated marked narrowing of the left pulmonary arteries due to hilar mass. Almost complete occlusion (less than 1 mm opacification) of the left upper lobe pulmonary artery. 5. Associated occlusion of the right upper lobe bronchi as well as internal debris. 6. Question tree-in-bud nodularity of the left lower lobe with limited evaluation on this noncontrast study. This could represent infection/inflammation. 7. Small hiatal hernia. 8. Aortic Atherosclerosis (ICD10-I70.0) and Emphysema (ICD10-J43.9). 9. Additional imaging evaluation or consultation with Pulmonology or Thoracic Surgery recommended. Electronically Signed   By: Iven Finn M.D.   On: 10/11/2022 01:09   DG Chest 2 View  Result Date: 10/10/2022 CLINICAL DATA:  Shortness of breath, cough, and congestion EXAM: CHEST - 2 VIEW COMPARISON:  Chest x-ray October 17, 2014 FINDINGS: The cardiomediastinal silhouette is unchanged in contour. Bilateral interstitial prominence and scattered bronchial wall thickening. Possible focal opacity in the left upper lobe seen on the AP view. No pleural effusion or pneumothorax. The visualized upper abdomen is unremarkable. Chronic right rib fracture deformities. IMPRESSION: Findings suggestive of small airways disease with possible superimposed pneumonia in the left upper lobe. Electronically Signed   By: Beryle Flock M.D.   On: 10/10/2022 17:57    ASSESSMENT: Small cell carcinoma of the lung.  PLAN:    Small cell carcinoma of the lung: CT scan results reviewed independently and reported as above revealing left upper and left lower lobe pulmonary nodules as well as a large 8.6 x 5.5 x 7.5 mass centered within the left hilar region encasing the left main pulmonary artery.  Biopsy confirmed small cell lung carcinoma.  Patient could not tolerate MRI, but CT of the brain on October 11, 2022 is negative.  Patient will initiate  palliative XRT tomorrow and will require approximately 2 weeks of treatment.  Upon discharge, patient will require port placement as well as PET scan.  Once XRT is completed, patient will initiate chemotherapy most likely with cisplatin and etoposide.  Follow-up in the cancer center next week for further evaluation and additional treatment planning. Shortness of breath: Patient will likely require oxygen at home. Disposition: Possible discharge today with follow-up in the cancer center as above.    Lloyd Huger, MD   10/17/2022 6:29 AM

## 2022-10-16 NOTE — Consult Note (Signed)
[place] [facility] Telephone:(336) [phone] Fax:(336) [phone]   Name: Trevor Montgomery Date: 10/16/2022 MRN: [id number]  DOB: [birth year]  Patient Care Team: Tracie Harrier, MD as PCP - General (Internal Medicine)    REASON FOR CONSULTATION: Trevor Montgomery is a 62 y.o. male with multiple medical problems including schizoaffective disorder who is admitted to hospital on 10/10/2022 with shortness of breath and was found to have a new lung mass.  CTA of the chest revealed multiple left upper lobe nodules, hilar lymphadenopathy with associated encasement of the left main pulmonary artery, narrowing of the left pulmonary arteries due to hilar mass with almost complete occlusion, associated occlusion of the right upper lobe bronchi.  Hospitalization has been complicated by A-fib, which resolved after dose of metoprolol.  Palliative care was consulted to address goals.  SOCIAL HISTORY:     reports that he has been smoking. He does not have any smokeless tobacco history on file. He reports that he does not drink alcohol and does not use drugs.  Patient is unmarried and has no children.  He lives at home with his sister.  ADVANCE DIRECTIVES:  Does not have  CODE STATUS: Full code  PAST MEDICAL HISTORY: Past Medical History:  Diagnosis Date   HTN (hypertension)    Hyperlipemia    Schizoaffective disorder, bipolar type ([place])     PAST SURGICAL HISTORY:  Past Surgical History:  Procedure Laterality Date   FINGER SURGERY     VIDEO BRONCHOSCOPY WITH ENDOBRONCHIAL ULTRASOUND Left 10/11/2022   Procedure: VIDEO BRONCHOSCOPY WITH ENDOBRONCHIAL ULTRASOUND;  Surgeon: Armando Reichert, MD;  Location: [facility] ORS;  Service: Pulmonary;  Laterality: Left;    HEMATOLOGY/ONCOLOGY HISTORY:  Oncology History   No history exists.    ALLERGIES:  has No Known Allergies.  MEDICATIONS:  Current Facility-Administered Medications  Medication Dose Route Frequency  Provider Last Rate Last Admin   acetaminophen (TYLENOL) tablet 650 mg  650 mg Oral Q6H PRN Para Skeans, MD       Or   acetaminophen (TYLENOL) suppository 650 mg  650 mg Rectal Q6H PRN Para Skeans, MD       albuterol (PROVENTIL) (2.5 MG/3ML) 0.083% nebulizer solution 2.5 mg  2.5 mg Nebulization Q2H PRN Para Skeans, MD       albuterol (PROVENTIL) (2.5 MG/3ML) 0.083% nebulizer solution 3 mL  3 mL Inhalation Q6H PRN Emeterio Reeve, DO       benztropine (COGENTIN) tablet 2 mg  2 mg Oral BID Emeterio Reeve, DO   2 mg at 10/16/22 1104   bisacodyl (DULCOLAX) EC tablet 5 mg  5 mg Oral Daily PRN Para Skeans, MD       docusate sodium (COLACE) capsule 100 mg  100 mg Oral BID PRN Hollice Gong, Mir Earlie Server, MD       fluPHENAZine (PROLIXIN) tablet 2.5 mg  2.5 mg Oral Daily Emeterio Reeve, DO   2.5 mg at 10/16/22 1229   guaiFENesin (MUCINEX) 12 hr tablet 600 mg  600 mg Oral BID PRN Para Skeans, MD       lisinopril (ZESTRIL) tablet 20 mg  20 mg Oral Daily Emeterio Reeve, DO   20 mg at 10/16/22 1103   And   hydrochlorothiazide (HYDRODIURIL) tablet 12.5 mg  12.5 mg Oral Daily Emeterio Reeve, DO   12.5 mg at 10/16/22 1104   iohexol (OMNIPAQUE) 300 MG/ML solution 100 mL  100 mL Intravenous Once PRN Tomma Rakers, MD  meloxicam (MOBIC) tablet 15 mg  15 mg Oral Daily Emeterio Reeve, DO   15 mg at 10/16/22 1104   multivitamin with minerals tablet 1 tablet  1 tablet Oral Daily Emeterio Reeve, DO   1 tablet at 10/16/22 1104   nicotine (NICODERM CQ - dosed in mg/24 hours) patch 21 mg  21 mg Transdermal Daily Emeterio Reeve, DO       Oral care mouth rinse  15 mL Mouth Rinse PRN Para Skeans, MD   15 mL at 10/11/22 1450   polyethylene glycol (MIRALAX / GLYCOLAX) packet 17 g  17 g Oral Daily PRN Para Skeans, MD       propranolol (INDERAL) tablet 5 mg  5 mg Oral BID Emeterio Reeve, DO   5 mg at 10/16/22 1104   simvastatin (ZOCOR) tablet 20 mg  20 mg Oral QHS  Emeterio Reeve, DO       tranexamic acid (CYKLOKAPRON) injection 1,000 mg  1,000 mg Topical Once [facility]       Vitamin E CAPS 400 Units  400 Units Oral Daily Emeterio Reeve, DO   400 Units at 10/16/22 1229    VITAL SIGNS: BP (!) 175/102 (BP Location: Left Arm)   Pulse 96   Temp 98 F (36.7 C)   Resp 17   Ht 5\' 8"  (1.727 m)   Wt 165 lb 5.5 oz (75 kg)   SpO2 93%   BMI 25.14 kg/m  Filed Weights   10/10/22 2309 10/11/22 1435  Weight: 165 lb 5.5 oz (75 kg) 165 lb 5.5 oz (75 kg)    Estimated body mass index is 25.14 kg/m as calculated from the following:   Height as of this encounter: 5\' 8"  (1.727 m).   Weight as of this encounter: 165 lb 5.5 oz (75 kg).  LABS: CBC:    Component Value Date/Time   WBC 8.2 10/16/2022 0434   HGB 11.4 (L) 10/16/2022 0434   HCT 36.0 (L) 10/16/2022 0434   PLT 321 10/16/2022 0434   MCV 85.9 10/16/2022 0434   Comprehensive Metabolic Panel:    Component Value Date/Time   NA 133 (L) 10/16/2022 0434   K 3.8 10/16/2022 0434   CL 96 (L) 10/16/2022 0434   CO2 32 10/16/2022 0434   BUN 11 10/16/2022 0434   CREATININE 0.50 (L) 10/16/2022 0434   GLUCOSE 94 10/16/2022 0434   CALCIUM 8.5 (L) 10/16/2022 0434   AST 28 10/10/2022 1730   ALT 23 10/10/2022 1730   ALKPHOS 56 10/10/2022 1730   BILITOT 0.8 10/10/2022 1730   PROT 6.8 10/10/2022 1730   ALBUMIN 3.9 10/10/2022 1730    RADIOGRAPHIC STUDIES: CT HEAD W & WO CONTRAST (5MM)  Result Date: 10/11/2022 CLINICAL DATA:  Metastatic disease evaluation EXAM: CT HEAD WITHOUT AND WITH CONTRAST TECHNIQUE: Contiguous axial images were obtained from the base of the skull through the vertex without and with intravenous contrast. RADIATION DOSE REDUCTION: This exam was performed according to the departmental dose-optimization program which includes automated exposure control, adjustment of the mA and/or kV according to patient size and/or use of iterative reconstruction technique. CONTRAST:   7mL OMNIPAQUE IOHEXOL 350 MG/ML SOLN COMPARISON:  None Available. FINDINGS: Brain: There is no mass, hemorrhage or extra-axial collection. The size and configuration of the ventricles and extra-axial CSF spaces are normal. The brain parenchyma is normal, without acute or chronic infarction. No abnormal enhancement. Vascular: No abnormal hyperdensity of the major intracranial arteries or dural venous sinuses. No  intracranial atherosclerosis. Skull: The visualized skull base, calvarium and extracranial soft tissues are normal. Sinuses/Orbits: No fluid levels or advanced mucosal thickening of the visualized paranasal sinuses. No mastoid or middle ear effusion. The orbits are normal. IMPRESSION: Normal head CT. Electronically Signed   By: Ulyses Jarred M.D.   On: 10/11/2022 23:08   CT ABDOMEN PELVIS W CONTRAST  Result Date: 10/11/2022 CLINICAL DATA:  Metastatic disease evaluation EXAM: CT ABDOMEN AND PELVIS WITH CONTRAST TECHNIQUE: Multidetector CT imaging of the abdomen and pelvis was performed using the standard protocol following bolus administration of intravenous contrast. RADIATION DOSE REDUCTION: This exam was performed according to the departmental dose-optimization program which includes automated exposure control, adjustment of the mA and/or kV according to patient size and/or use of iterative reconstruction technique. CONTRAST:  73mL OMNIPAQUE IOHEXOL 350 MG/ML SOLN COMPARISON:  CT angiogram chest 10/11/2022 FINDINGS: Lower chest: There is atelectasis in the lung bases. Left lower lobe mass is partially imaged. Please see dedicated CT chest performed same day. Hepatobiliary: There are 3 hypodensities in the left lobe of the liver measuring up to 15 mm, indeterminate given chest findings. There is no biliary ductal dilatation. The gallbladder is within normal limits. Pancreas: Unremarkable. No pancreatic ductal dilatation or surrounding inflammatory changes. Spleen: Normal in size without focal  abnormality. Adrenals/Urinary Tract: Bilateral renal cortical cysts are present measuring up to 3.5 cm. There is no hydronephrosis or perinephric fat stranding. The adrenal glands and bladder are within normal limits. The bladder is distended. Stomach/Bowel: There is a small hiatal hernia. Stomach is otherwise within normal limits. Appendix is not seen. No evidence of bowel wall thickening, distention, or inflammatory changes. There is sigmoid colon diverticulosis. Vascular/Lymphatic: No significant vascular findings are present. No enlarged abdominal or pelvic lymph nodes. Reproductive: Prostate gland is mildly enlarged. Other: No abdominal wall hernia or abnormality. No abdominopelvic ascites. Musculoskeletal: No acute or significant osseous findings. IMPRESSION: 1. Three hypodense lesions in the left lobe of the liver measuring up to 15 mm, indeterminate. These may represent cysts or hemangiomas, but other etiologies such as metastatic disease are not excluded given findings in the chest. Consider further evaluation with MRI. 2. No other evidence for metastatic disease in the abdomen or pelvis. 3. Small hiatal hernia. 4. Sigmoid colon diverticulosis. 5. Bilateral renal cysts.  No follow-up necessary. Electronically Signed   By: Ronney Asters M.D.   On: 10/11/2022 21:37   DG Chest Port 1 View  Result Date: 10/11/2022 CLINICAL DATA:  Post bronchoscopy with biopsy EXAM: PORTABLE CHEST 1 VIEW COMPARISON:  Portable exam 1704 hours compared to CT chest 10/11/2022 FINDINGS: Normal heart size and pulmonary vascularity. Again identified LEFT hilar enlargement/adenopathy and LEFT perihilar mass. Minimal infiltrate in LEFT lung adjacent to mass. Remaining lungs clear. No pleural effusion or pneumothorax. IMPRESSION: No pneumothorax following bronchoscopy. LEFT hilar adenopathy and LEFT perihilar mass again identified with mild LEFT perihilar edema postprocedure. Electronically Signed   By: Lavonia Dana M.D.   On:  10/11/2022 17:23   ECHOCARDIOGRAM COMPLETE  Result Date: 10/11/2022    ECHOCARDIOGRAM REPORT   Patient Name:   [facility] Date of Exam: 10/11/2022 Medical Rec #:  [id number]  Height:       68.0 in Accession #:    [id number] Weight:       165.3 lb Date of Birth:  1961-02-09   BSA:          1.885 m Patient Age:    21 years   BP:  134/80 mmHg Patient Gender: M          HR:           87 bpm. Exam Location:  [facility] Procedure: 2D Echo, Cardiac Doppler and Color Doppler Indications:     Atrial Fibrillation I48.91  History:         Patient has no prior history of Echocardiogram examinations.                  Risk Factors:Hypertension and Dyslipidemia.  Sonographer:     Sherrie Sport Referring Phys:  [id number] [place] Diagnosing Phys: Ida Rogue MD  Sonographer Comments: Image quality was fair. IMPRESSIONS  1. Left ventricular ejection fraction, by estimation, is 60 to 65%. The left ventricle has normal function. The left ventricle has no regional wall motion abnormalities. Left ventricular diastolic parameters are consistent with Grade I diastolic dysfunction (impaired relaxation).  2. Right ventricular systolic function is normal. The right ventricular size is normal.  3. The mitral valve is normal in structure. No evidence of mitral valve regurgitation. No evidence of mitral stenosis.  4. The aortic valve is normal in structure. Aortic valve regurgitation is not visualized. No aortic stenosis is present.  5. There is borderline dilatation of the aortic root, measuring 37 mm.  6. The inferior vena cava is normal in size with greater than 50% respiratory variability, suggesting right atrial pressure of 3 mmHg. FINDINGS  Left Ventricle: Left ventricular ejection fraction, by estimation, is 60 to 65%. The left ventricle has normal function. The left ventricle has no regional wall motion abnormalities. The left ventricular internal cavity size was normal in size. There is  no left ventricular  hypertrophy. Left ventricular diastolic parameters are consistent with Grade I diastolic dysfunction (impaired relaxation). Right Ventricle: The right ventricular size is normal. No increase in right ventricular wall thickness. Right ventricular systolic function is normal. Left Atrium: Left atrial size was normal in size. Right Atrium: Right atrial size was normal in size. Pericardium: There is no evidence of pericardial effusion. Mitral Valve: The mitral valve is normal in structure. No evidence of mitral valve regurgitation. No evidence of mitral valve stenosis. Tricuspid Valve: The tricuspid valve is normal in structure. Tricuspid valve regurgitation is mild . No evidence of tricuspid stenosis. Aortic Valve: The aortic valve is normal in structure. Aortic valve regurgitation is not visualized. No aortic stenosis is present. Aortic valve mean gradient measures 4.0 mmHg. Aortic valve peak gradient measures 6.2 mmHg. Aortic valve area, by VTI measures 3.33 cm. Pulmonic Valve: The pulmonic valve was normal in structure. Pulmonic valve regurgitation is not visualized. No evidence of pulmonic stenosis. Aorta: The aortic root is normal in size and structure. There is borderline dilatation of the aortic root, measuring 37 mm. Venous: The inferior vena cava is normal in size with greater than 50% respiratory variability, suggesting right atrial pressure of 3 mmHg. IAS/Shunts: No atrial level shunt detected by color flow Doppler.  LEFT VENTRICLE PLAX 2D LVIDd:         3.70 cm   Diastology LVIDs:         2.50 cm   LV e' medial:    9.14 cm/s LV PW:         1.10 cm   LV E/e' medial:  9.5 LV IVS:        1.00 cm   LV e' lateral:   8.49 cm/s LVOT diam:     2.00 cm   LV E/e' lateral: 10.2  LV SV:         84 LV SV Index:   45 LVOT Area:     3.14 cm  RIGHT VENTRICLE RV Basal diam:  3.90 cm RV Mid diam:    4.10 cm RV S prime:     16.50 cm/s TAPSE (M-mode): 2.7 cm LEFT ATRIUM           Index        RIGHT ATRIUM           Index LA  diam:      2.20 cm 1.17 cm/m   RA Area:     11.50 cm LA Vol (A2C): 63.7 ml 33.79 ml/m  RA Volume:   24.40 ml  12.94 ml/m LA Vol (A4C): 25.5 ml 13.53 ml/m  AORTIC VALVE AV Area (Vmax):    3.44 cm AV Area (Vmean):   3.40 cm AV Area (VTI):     3.33 cm AV Vmax:           125.00 cm/s AV Vmean:          95.200 cm/s AV VTI:            0.253 m AV Peak Grad:      6.2 mmHg AV Mean Grad:      4.0 mmHg LVOT Vmax:         137.00 cm/s LVOT Vmean:        103.000 cm/s LVOT VTI:          0.268 m LVOT/AV VTI ratio: 1.06  AORTA Ao Root diam: 3.70 cm MITRAL VALVE               TRICUSPID VALVE MV Area (PHT): 5.23 cm    TR Peak grad:   19.9 mmHg MV Decel Time: 145 msec    TR Vmax:        223.00 cm/s MV E velocity: 86.50 cm/s MV A velocity: 91.70 cm/s  SHUNTS MV E/A ratio:  0.94        Systemic VTI:  0.27 m                            Systemic Diam: 2.00 cm Ida Rogue MD Electronically signed by Ida Rogue MD Signature Date/Time: 10/11/2022/5:11:44 PM    Final    CT Angio Chest Pulmonary Embolism (PE) W or WO Contrast  Result Date: 10/11/2022 CLINICAL DATA:  Pulmonary embolism (PE) suspected, high prob EXAM: CT ANGIOGRAPHY CHEST WITH CONTRAST TECHNIQUE: Multidetector CT imaging of the chest was performed using the standard protocol during bolus administration of intravenous contrast. Multiplanar CT image reconstructions and MIPs were obtained to evaluate the vascular anatomy. RADIATION DOSE REDUCTION: This exam was performed according to the departmental dose-optimization program which includes automated exposure control, adjustment of the mA and/or kV according to patient size and/or use of iterative reconstruction technique. CONTRAST:  60mL OMNIPAQUE IOHEXOL 350 MG/ML SOLN COMPARISON:  Chest x-ray 10/10/2022, CT chest 12/12/2008 FINDINGS: Cardiovascular: Satisfactory opacification of the pulmonary arteries to the segmental level. No evidence of pulmonary embolism. Limited evaluation of the subsegmental level due to  motion artifact. Marked narrowing of the left pulmonary arteries due to hilar mass. The main pulmonary artery is normal in caliber. Normal heart size. No significant pericardial effusion. The thoracic aorta is normal in caliber. No atherosclerotic plaque of the thoracic aorta. No coronary artery calcifications. Mediastinum/Nodes: There is a at least 8.6 x 5.5 x 7.5 cm mass with irregular  Samon Dishner centered within the left hilar region with associated encasement of the left main pulmonary artery as well as the origin of all of the left segmental pulmonary arteries. Associated narrowing of these arteries with almost complete occlusion (less than 1 mm opacification) of the left upper lobe pulmonary artery. The mass is also noted to encase the left or upper and lower lobe bronchi (4:78). Associated occlusion of the right upper lobe bronchi as well as internal debris. Left hilar mass versus conglomerate of lymphadenopathy extensive the pre-vascular region and middle of the mediastinum. Associated prevascular lymph node measuring 1 point 5 cm. As well as a 1.1 cm precarinal lymph node (4:69). No enlarged right hilar or axillary lymph nodes. Thyroid gland, trachea, and esophagus demonstrate no significant findings. Small hiatal hernia. Lungs/Pleura: Limited evaluation due to motion artifact. At least moderate centrilobular emphysematous changes. Diffuse bronchial wall thickening. Left upper lobe 2.4 x 1.2 cm pulmonary nodule (5:78). Left lower lobe spiculated 1.8 x 0.9 cm pulmonary nodule (4:52). Likely several satellite micronodules within the left upper lobe (4:68). Question tree-in-bud nodularity of the left lower lobe. No pleural effusion. No pneumothorax. Upper Abdomen: Left renal fluid dense lesions-likely simple renal cysts. Simple renal cysts, in the absence of clinically indicated signs/symptoms, require no independent follow-up. Musculoskeletal: No chest wall abnormality. No suspicious lytic or blastic osseous  lesions. No acute displaced fracture. Multilevel degenerative changes of the spine. Review of the MIP images confirms the above findings. IMPRESSION: 1. No evidence of pulmonary embolism. Limited evaluation of the subsegmental level due to motion artifact. 2. Left upper lobe 2.4 x 1.2 cm pulmonary nodule. Left lower lobe spiculated 1.8 x 0.9 cm pulmonary nodule. 3. At least 8.6 x 5.5 x 7.5 cm mass/conglomeraive lymphadenopathy centered within the left hilar region with associated encasement of the left main pulmonary artery, origin of all of the left segmental pulmonary arteries, left upper lobe and lower lobe bronchi. Mediastinal lymphadenopathy. 4. Associated marked narrowing of the left pulmonary arteries due to hilar mass. Almost complete occlusion (less than 1 mm opacification) of the left upper lobe pulmonary artery. 5. Associated occlusion of the right upper lobe bronchi as well as internal debris. 6. Question tree-in-bud nodularity of the left lower lobe with limited evaluation on this noncontrast study. This could represent infection/inflammation. 7. Small hiatal hernia. 8. Aortic Atherosclerosis (ICD10-I70.0) and Emphysema (ICD10-J43.9). 9. Additional imaging evaluation or consultation with Pulmonology or Thoracic Surgery recommended. Electronically Signed   By: Iven Finn M.D.   On: 10/11/2022 01:09   DG Chest 2 View  Result Date: 10/10/2022 CLINICAL DATA:  Shortness of breath, cough, and congestion EXAM: CHEST - 2 VIEW COMPARISON:  Chest x-ray October 17, 2014 FINDINGS: The cardiomediastinal silhouette is unchanged in contour. Bilateral interstitial prominence and scattered bronchial wall thickening. Possible focal opacity in the left upper lobe seen on the AP view. No pleural effusion or pneumothorax. The visualized upper abdomen is unremarkable. Chronic right rib fracture deformities. IMPRESSION: Findings suggestive of small airways disease with possible superimposed pneumonia in the left  upper lobe. Electronically Signed   By: Beryle Flock M.D.   On: 10/10/2022 17:57    PERFORMANCE STATUS (ECOG) : 1 - Symptomatic but completely ambulatory  Review of Systems Unless otherwise noted, a complete review of systems is negative.  Physical Exam General: NAD Cardiovascular: regular rate and rhythm Pulmonary: clear ant fields Abdomen: soft, nontender, + bowel sounds GU: no suprapubic tenderness Extremities: no edema, no joint deformities Skin: no rashes Neurological: Weakness  but otherwise nonfocal  IMPRESSION: I met with patient and his sister.  Patient lacks insight of his current medical problems.  His sister says that he often will forget what has been discussed with him.  However, he does seem to have some understanding that he likely has cancer and will require treatment for that.  He is fairly deferential to his sister for decision-making.  Patient has been evaluated by radiation oncology given the extent of his pulmonary disease as well as occlusion of his right bronchus and near occlusion of his left pulmonary arteries.  Plan is to complete outpatient workup and patient will follow-up with medical oncology for discussion regarding systemic treatment options.  Patient's sister verbalized agreement with the current scope of treatment and plan to complete workup.  Prior to this hospitalization, patient was living at home with his sister.  He was functionally independent with his own care.  Patient's sister verbalized some concern that she would not be able to functionally care for him at home should he require significant support.  PLAN: -Continue current scope of treatment -Will follow patient in the outpatient clinic  Case and plan discussed with [name]  Time Total: 30 minutes  Visit consisted of counseling and education dealing with the complex and emotionally intense issues of symptom management and palliative care in the setting of serious and  potentially life-threatening illness.Greater than 50%  of this time was spent counseling and coordinating care related to the above assessment and plan.  Signed by: Altha Harm, PhD, NP-C

## 2022-10-16 NOTE — Care Management Important Message (Signed)
Important Message  Patient Details  Name: Trevor Montgomery MRN: [id number] Date of Birth: 1960-11-16   Medicare Important Message Given:  Yes     Juliann Pulse A Alaysia Lightle 10/16/2022, 3:23 PM

## 2022-10-17 ENCOUNTER — Ambulatory Visit
Admission: RE | Admit: 2022-10-17 | Discharge: 2022-10-17 | Disposition: A | Discharge status: Home / Self Care | Payer: Medicare Other | Source: Ambulatory Visit | Attending: Radiation Oncology | Admitting: Radiation Oncology

## 2022-10-17 ENCOUNTER — Other Ambulatory Visit: Payer: Self-pay

## 2022-10-17 ENCOUNTER — Encounter: Payer: Self-pay | Admitting: *Deleted

## 2022-10-17 DIAGNOSIS — C3492 Malignant neoplasm of unspecified part of left bronchus or lung: Secondary | ICD-10-CM | POA: Diagnosis present

## 2022-10-17 DIAGNOSIS — C3490 Malignant neoplasm of unspecified part of unspecified bronchus or lung: Secondary | ICD-10-CM

## 2022-10-17 DIAGNOSIS — F17200 Nicotine dependence, unspecified, uncomplicated: Secondary | ICD-10-CM | POA: Diagnosis not present

## 2022-10-17 DIAGNOSIS — Z510 Encounter for antineoplastic radiation therapy: Secondary | ICD-10-CM | POA: Diagnosis present

## 2022-10-17 LAB — RAD ONC ARIA SESSION SUMMARY
Course Elapsed Days: 0
Plan Fractions Treated to Date: 1
Plan Prescribed Dose Per Fraction: 2 Gy
Plan Total Fractions Prescribed: 20
Plan Total Prescribed Dose: 40 Gy
Reference Point Dosage Given to Date: 2 Gy
Reference Point Session Dosage Given: 2 Gy
Session Number: 1

## 2022-10-17 NOTE — Progress Notes (Signed)
Pt recently discharged from hospital and needs appts coordinated for PET scan and follow up with [name]. Orders placed and PET scan pending authorization. Message sent to scheduling to schedule pt for follow up appt. Nothing further needed at this time.

## 2022-10-18 ENCOUNTER — Other Ambulatory Visit: Payer: Self-pay | Admitting: *Deleted

## 2022-10-18 ENCOUNTER — Ambulatory Visit
Admission: RE | Admit: 2022-10-18 | Discharge: 2022-10-18 | Disposition: A | Discharge status: Home / Self Care | Payer: Medicare Other | Source: Ambulatory Visit | Attending: Radiation Oncology | Admitting: Radiation Oncology

## 2022-10-18 ENCOUNTER — Other Ambulatory Visit: Payer: Self-pay

## 2022-10-18 DIAGNOSIS — Z510 Encounter for antineoplastic radiation therapy: Secondary | ICD-10-CM | POA: Diagnosis not present

## 2022-10-18 DIAGNOSIS — R918 Other nonspecific abnormal finding of lung field: Secondary | ICD-10-CM

## 2022-10-18 LAB — RAD ONC ARIA SESSION SUMMARY
Course Elapsed Days: 1
Plan Fractions Treated to Date: 2
Plan Prescribed Dose Per Fraction: 2 Gy
Plan Total Fractions Prescribed: 20
Plan Total Prescribed Dose: 40 Gy
Reference Point Dosage Given to Date: 4 Gy
Reference Point Session Dosage Given: 2 Gy
Session Number: 2

## 2022-10-21 ENCOUNTER — Ambulatory Visit
Admission: RE | Admit: 2022-10-21 | Discharge: 2022-10-21 | Disposition: A | Discharge status: Home / Self Care | Payer: Medicare Other | Source: Ambulatory Visit | Attending: Radiation Oncology | Admitting: Radiation Oncology

## 2022-10-21 ENCOUNTER — Other Ambulatory Visit: Payer: Self-pay

## 2022-10-21 DIAGNOSIS — Z510 Encounter for antineoplastic radiation therapy: Secondary | ICD-10-CM | POA: Diagnosis not present

## 2022-10-21 LAB — RAD ONC ARIA SESSION SUMMARY
Course Elapsed Days: 4
Plan Fractions Treated to Date: 3
Plan Prescribed Dose Per Fraction: 2 Gy
Plan Total Fractions Prescribed: 20
Plan Total Prescribed Dose: 40 Gy
Reference Point Dosage Given to Date: 6 Gy
Reference Point Session Dosage Given: 2 Gy
Session Number: 3

## 2022-10-21 NOTE — Progress Notes (Signed)
Enrolled patient into the [employer] and [employer]

## 2022-10-22 ENCOUNTER — Ambulatory Visit
Admission: RE | Admit: 2022-10-22 | Discharge: 2022-10-22 | Disposition: A | Discharge status: Home / Self Care | Payer: Medicare Other | Source: Ambulatory Visit | Attending: Radiation Oncology | Admitting: Radiation Oncology

## 2022-10-22 ENCOUNTER — Other Ambulatory Visit: Payer: Self-pay

## 2022-10-22 DIAGNOSIS — Z510 Encounter for antineoplastic radiation therapy: Secondary | ICD-10-CM | POA: Diagnosis not present

## 2022-10-22 LAB — RAD ONC ARIA SESSION SUMMARY
Course Elapsed Days: 5
Plan Fractions Treated to Date: 4
Plan Prescribed Dose Per Fraction: 2 Gy
Plan Total Fractions Prescribed: 20
Plan Total Prescribed Dose: 40 Gy
Reference Point Dosage Given to Date: 8 Gy
Reference Point Session Dosage Given: 2 Gy
Session Number: 4

## 2022-10-23 ENCOUNTER — Other Ambulatory Visit: Payer: Self-pay

## 2022-10-23 ENCOUNTER — Ambulatory Visit
Admission: RE | Admit: 2022-10-23 | Discharge: 2022-10-23 | Disposition: A | Discharge status: Home / Self Care | Payer: Medicare Other | Source: Ambulatory Visit | Attending: Radiation Oncology | Admitting: Radiation Oncology

## 2022-10-23 DIAGNOSIS — Z510 Encounter for antineoplastic radiation therapy: Secondary | ICD-10-CM | POA: Diagnosis not present

## 2022-10-23 LAB — RAD ONC ARIA SESSION SUMMARY
Course Elapsed Days: 6
Plan Fractions Treated to Date: 5
Plan Prescribed Dose Per Fraction: 2 Gy
Plan Total Fractions Prescribed: 20
Plan Total Prescribed Dose: 40 Gy
Reference Point Dosage Given to Date: 10 Gy
Reference Point Session Dosage Given: 2 Gy
Session Number: 5

## 2022-10-24 ENCOUNTER — Ambulatory Visit
Admission: RE | Admit: 2022-10-24 | Discharge: 2022-10-24 | Disposition: A | Discharge status: Home / Self Care | Payer: Medicare Other | Source: Ambulatory Visit | Attending: Radiation Oncology | Admitting: Radiation Oncology

## 2022-10-24 ENCOUNTER — Other Ambulatory Visit: Payer: Self-pay

## 2022-10-24 ENCOUNTER — Other Ambulatory Visit

## 2022-10-24 ENCOUNTER — Inpatient Hospital Stay: Payer: Medicare Other | Admitting: Oncology

## 2022-10-24 ENCOUNTER — Inpatient Hospital Stay: Payer: Medicare Other

## 2022-10-24 DIAGNOSIS — C3492 Malignant neoplasm of unspecified part of left bronchus or lung: Secondary | ICD-10-CM | POA: Diagnosis present

## 2022-10-24 DIAGNOSIS — F17200 Nicotine dependence, unspecified, uncomplicated: Secondary | ICD-10-CM | POA: Insufficient documentation

## 2022-10-24 DIAGNOSIS — Z510 Encounter for antineoplastic radiation therapy: Secondary | ICD-10-CM | POA: Insufficient documentation

## 2022-10-24 LAB — RAD ONC ARIA SESSION SUMMARY
Course Elapsed Days: 7
Plan Fractions Treated to Date: 6
Plan Prescribed Dose Per Fraction: 2 Gy
Plan Total Fractions Prescribed: 20
Plan Total Prescribed Dose: 40 Gy
Reference Point Dosage Given to Date: 12 Gy
Reference Point Session Dosage Given: 2 Gy
Session Number: 6

## 2022-10-25 ENCOUNTER — Ambulatory Visit
Admission: RE | Admit: 2022-10-25 | Discharge: 2022-10-25 | Disposition: A | Discharge status: Home / Self Care | Payer: Medicare Other | Source: Ambulatory Visit | Attending: Radiation Oncology | Admitting: Radiation Oncology

## 2022-10-25 ENCOUNTER — Other Ambulatory Visit: Payer: Self-pay

## 2022-10-25 DIAGNOSIS — Z510 Encounter for antineoplastic radiation therapy: Secondary | ICD-10-CM | POA: Diagnosis not present

## 2022-10-25 LAB — RAD ONC ARIA SESSION SUMMARY
Course Elapsed Days: 8
Plan Fractions Treated to Date: 7
Plan Prescribed Dose Per Fraction: 2 Gy
Plan Total Fractions Prescribed: 20
Plan Total Prescribed Dose: 40 Gy
Reference Point Dosage Given to Date: 14 Gy
Reference Point Session Dosage Given: 2 Gy
Session Number: 7

## 2022-10-26 ENCOUNTER — Other Ambulatory Visit (HOSPITAL_BASED_OUTPATIENT_CLINIC_OR_DEPARTMENT_OTHER): Payer: Self-pay | Admitting: Osteopathic Medicine

## 2022-10-28 ENCOUNTER — Ambulatory Visit
Admission: RE | Admit: 2022-10-28 | Discharge: 2022-10-28 | Disposition: A | Discharge status: Home / Self Care | Payer: Medicare Other | Source: Ambulatory Visit | Attending: Radiation Oncology | Admitting: Radiation Oncology

## 2022-10-28 ENCOUNTER — Other Ambulatory Visit: Payer: Self-pay

## 2022-10-28 DIAGNOSIS — Z510 Encounter for antineoplastic radiation therapy: Secondary | ICD-10-CM | POA: Diagnosis not present

## 2022-10-28 LAB — RAD ONC ARIA SESSION SUMMARY
Course Elapsed Days: 11
Plan Fractions Treated to Date: 8
Plan Prescribed Dose Per Fraction: 2 Gy
Plan Total Fractions Prescribed: 20
Plan Total Prescribed Dose: 40 Gy
Reference Point Dosage Given to Date: 16 Gy
Reference Point Session Dosage Given: 2 Gy
Session Number: 8

## 2022-10-29 ENCOUNTER — Inpatient Hospital Stay: Payer: Medicare Other

## 2022-10-29 ENCOUNTER — Inpatient Hospital Stay: Payer: Medicare Other | Attending: Oncology | Admitting: Oncology

## 2022-10-29 ENCOUNTER — Encounter: Payer: Self-pay | Admitting: *Deleted

## 2022-10-29 ENCOUNTER — Encounter: Payer: Self-pay | Admitting: Licensed Clinical Social Worker

## 2022-10-29 ENCOUNTER — Ambulatory Visit
Admission: RE | Admit: 2022-10-29 | Discharge: 2022-10-29 | Disposition: A | Discharge status: Home / Self Care | Payer: Medicare Other | Source: Ambulatory Visit | Attending: Radiation Oncology | Admitting: Radiation Oncology

## 2022-10-29 ENCOUNTER — Other Ambulatory Visit: Payer: Self-pay

## 2022-10-29 ENCOUNTER — Encounter: Payer: Self-pay | Admitting: Oncology

## 2022-10-29 VITALS — BP 106/72 | HR 82 | Temp 96.4°F | Ht 67.0 in | Wt 165.9 lb

## 2022-10-29 DIAGNOSIS — C3432 Malignant neoplasm of lower lobe, left bronchus or lung: Secondary | ICD-10-CM | POA: Insufficient documentation

## 2022-10-29 DIAGNOSIS — M899 Disorder of bone, unspecified: Secondary | ICD-10-CM | POA: Diagnosis not present

## 2022-10-29 DIAGNOSIS — Z87891 Personal history of nicotine dependence: Secondary | ICD-10-CM | POA: Diagnosis not present

## 2022-10-29 DIAGNOSIS — M25551 Pain in right hip: Secondary | ICD-10-CM | POA: Diagnosis not present

## 2022-10-29 DIAGNOSIS — E871 Hypo-osmolality and hyponatremia: Secondary | ICD-10-CM | POA: Insufficient documentation

## 2022-10-29 DIAGNOSIS — R0602 Shortness of breath: Secondary | ICD-10-CM | POA: Insufficient documentation

## 2022-10-29 DIAGNOSIS — M25552 Pain in left hip: Secondary | ICD-10-CM | POA: Diagnosis not present

## 2022-10-29 DIAGNOSIS — Z5111 Encounter for antineoplastic chemotherapy: Secondary | ICD-10-CM | POA: Diagnosis present

## 2022-10-29 DIAGNOSIS — Z9981 Dependence on supplemental oxygen: Secondary | ICD-10-CM | POA: Insufficient documentation

## 2022-10-29 DIAGNOSIS — C7951 Secondary malignant neoplasm of bone: Secondary | ICD-10-CM | POA: Diagnosis not present

## 2022-10-29 DIAGNOSIS — E876 Hypokalemia: Secondary | ICD-10-CM | POA: Diagnosis not present

## 2022-10-29 DIAGNOSIS — M545 Low back pain, unspecified: Secondary | ICD-10-CM | POA: Diagnosis not present

## 2022-10-29 DIAGNOSIS — D72829 Elevated white blood cell count, unspecified: Secondary | ICD-10-CM | POA: Insufficient documentation

## 2022-10-29 DIAGNOSIS — C3490 Malignant neoplasm of unspecified part of unspecified bronchus or lung: Secondary | ICD-10-CM

## 2022-10-29 DIAGNOSIS — I10 Essential (primary) hypertension: Secondary | ICD-10-CM | POA: Insufficient documentation

## 2022-10-29 DIAGNOSIS — Z5112 Encounter for antineoplastic immunotherapy: Secondary | ICD-10-CM | POA: Diagnosis not present

## 2022-10-29 DIAGNOSIS — Z510 Encounter for antineoplastic radiation therapy: Secondary | ICD-10-CM | POA: Diagnosis not present

## 2022-10-29 DIAGNOSIS — Z5189 Encounter for other specified aftercare: Secondary | ICD-10-CM | POA: Insufficient documentation

## 2022-10-29 DIAGNOSIS — Z79899 Other long term (current) drug therapy: Secondary | ICD-10-CM | POA: Insufficient documentation

## 2022-10-29 LAB — RAD ONC ARIA SESSION SUMMARY
Course Elapsed Days: 12
Plan Fractions Treated to Date: 9
Plan Prescribed Dose Per Fraction: 2 Gy
Plan Total Fractions Prescribed: 20
Plan Total Prescribed Dose: 40 Gy
Reference Point Dosage Given to Date: 18 Gy
Reference Point Session Dosage Given: 2 Gy
Session Number: 9

## 2022-10-29 MED ORDER — ONDANSETRON HCL 8 MG PO TABS: 8.0000 mg | ORAL_TABLET | Freq: Three times a day (TID) | ORAL | 2 refills | Status: DC | PRN

## 2022-10-29 MED ORDER — LIDOCAINE-PRILOCAINE 2.5-2.5 % EX CREA: TOPICAL_CREAM | CUTANEOUS | 3 refills | Status: DC

## 2022-10-29 MED ORDER — PROCHLORPERAZINE MALEATE 10 MG PO TABS: 10.0000 mg | ORAL_TABLET | Freq: Four times a day (QID) | ORAL | 2 refills | Status: DC | PRN

## 2022-10-29 NOTE — Progress Notes (Signed)
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  INTERVAL HISTORY: Patient returns to clinic today for hospital follow-up, discussion of his pathology results, and treatment planning.  He started palliative XRT recently and is tolerating treatment well.  He continues to have shortness of breath and cough.  He has no neurologic complaints.  He denies any recent fevers.  He has a good appetite and denies weight loss.  He has no chest pain or hemoptysis.  He has no nausea, vomiting, constipation, or diarrhea.  He has no urinary complaints.  Patient otherwise feels well and offers no further specific complaints.  REVIEW OF SYSTEMS:   Review of Systems  Constitutional: Negative.  Negative for fever, malaise/fatigue and weight loss.  Respiratory:  Positive for cough and shortness of breath. Negative for hemoptysis.   Cardiovascular: Negative.  Negative for chest pain and leg swelling.  Gastrointestinal: Negative.  Negative for abdominal pain and nausea.  Genitourinary: Negative.  Negative for dysuria.  Musculoskeletal: Negative.  Negative for back pain.  Skin: Negative.  Negative for rash.  Neurological: Negative.  Negative for dizziness, focal weakness, weakness and headaches.  Psychiatric/Behavioral: Negative.  The patient is not nervous/anxious.     As per HPI. Otherwise, a complete review of systems is negative.  PAST MEDICAL HISTORY: Past Medical History:  Diagnosis Date   Anxiety    Arthritis    Emphysema of lung ([place])    HTN (hypertension)    Hyperlipemia    Schizoaffective disorder, bipolar type ([place])    Substance abuse ([place])     PAST SURGICAL HISTORY: Past Surgical History:  Procedure Laterality  Date   FINGER SURGERY     VIDEO BRONCHOSCOPY WITH ENDOBRONCHIAL ULTRASOUND Left 10/11/2022   Procedure: VIDEO BRONCHOSCOPY WITH ENDOBRONCHIAL ULTRASOUND;  Surgeon: Armando Reichert, MD;  Location: [facility] ORS;  Service: Pulmonary;  Laterality: Left;    FAMILY HISTORY: Family History  Problem Relation Age of Onset   Stroke Father    Seizures Father    Stroke Sister    Cancer Maternal Grandmother     ADVANCED DIRECTIVES (Y/N):  N  HEALTH MAINTENANCE: Social History   Tobacco Use   Smoking status: Former    Packs/day: 0.50    Years: 40.00    Total pack years: 20.00    Types: Cigarettes    Quit date: 10/15/2022    Years since quitting: 0.0   Smokeless tobacco: Former    Quit date: 10/15/2022  Substance Use Topics   Alcohol use: No   Drug use: No     Colonoscopy:  PAP:  Bone density:  Lipid panel:  No Known Allergies  Current Outpatient Medications  Medication Sig Dispense Refill   albuterol (VENTOLIN HFA) 108 (90 Base) MCG/ACT inhaler Inhale 2 puffs into the lungs every 6 (six) hours as needed for wheezing or shortness of breath.     benztropine (COGENTIN) 2 MG tablet Take 2 mg by mouth 2 (two) times daily.     bisacodyl (DULCOLAX) 5 MG EC tablet Take 1 tablet (5 mg total) by mouth daily as needed for moderate constipation. 30 tablet 0   docusate sodium (COLACE) 100 MG capsule Take 1 capsule (100 mg total) by mouth 2 (two) times daily as needed for mild constipation.  10 capsule 0   fluPHENAZine (PROLIXIN) 5 MG tablet Take 2.5 mg by mouth daily.     guaiFENesin (MUCINEX) 600 MG 12 hr tablet Take 1 tablet (600 mg total) by mouth 2 (two) times daily as needed for cough. 30 tablet 0   meloxicam (MOBIC) 15 MG tablet Take 15 mg by mouth daily.     Multiple Vitamin (MULTI-VITAMIN) tablet Take 1 tablet by mouth daily.     propranolol (INDERAL) 10 MG tablet Take 0.5 tablets (5 mg total) by mouth 2 (two) times daily. 60 tablet 0   simvastatin (ZOCOR) 20 MG tablet Take 20 mg by mouth at  bedtime.     Vitamin E 400 units TABS Take 400 Units by mouth daily.     amLODipine (NORVASC) 10 MG tablet Take 1 tablet (10 mg total) by mouth daily. 30 tablet 0   nicotine (NICODERM CQ - DOSED IN MG/24 HOURS) 21 mg/24hr patch Place 21 mg onto the skin daily. (Patient not taking: Reported on 10/29/2022)     No current facility-administered medications for this visit.    OBJECTIVE: Vitals:   10/29/22 1008  BP: 106/72  Pulse: 82  Temp: (!) 96.4 F (35.8 C)     Body mass index is 25.98 kg/m.    ECOG FS:1 - Symptomatic but completely ambulatory  General: Well-developed, well-nourished, no acute distress.  Sitting in a wheelchair. Eyes: Pink conjunctiva, anicteric sclera. HEENT: Normocephalic, moist mucous membranes. Lungs: No audible wheezing or coughing. Heart: Regular rate and rhythm. Abdomen: Soft, nontender, no obvious distention. Musculoskeletal: No edema, cyanosis, or clubbing. Neuro: Alert, answering all questions appropriately. Cranial nerves grossly intact. Skin: No rashes or petechiae noted. Psych: Normal affect. Lymphatics: No cervical, calvicular, axillary or inguinal LAD.   LAB RESULTS:  Lab Results  Component Value Date   NA 133 (L) 10/16/2022   K 3.8 10/16/2022   CL 96 (L) 10/16/2022   CO2 32 10/16/2022   GLUCOSE 94 10/16/2022   BUN 11 10/16/2022   CREATININE 0.50 (L) 10/16/2022   CALCIUM 8.5 (L) 10/16/2022   PROT 6.8 10/10/2022   ALBUMIN 3.9 10/10/2022   AST 28 10/10/2022   ALT 23 10/10/2022   ALKPHOS 56 10/10/2022   BILITOT 0.8 10/10/2022   GFRNONAA >60 10/16/2022    Lab Results  Component Value Date   WBC 8.2 10/16/2022   HGB 11.4 (L) 10/16/2022   HCT 36.0 (L) 10/16/2022   MCV 85.9 10/16/2022   PLT 321 10/16/2022     STUDIES: CT HEAD W & WO CONTRAST (5MM)  Result Date: 10/11/2022 CLINICAL DATA:  Metastatic disease evaluation EXAM: CT HEAD WITHOUT AND WITH CONTRAST TECHNIQUE: Contiguous axial images were obtained from the base of the skull  through the vertex without and with intravenous contrast. RADIATION DOSE REDUCTION: This exam was performed according to the departmental dose-optimization program which includes automated exposure control, adjustment of the mA and/or kV according to patient size and/or use of iterative reconstruction technique. CONTRAST:  30mL OMNIPAQUE IOHEXOL 350 MG/ML SOLN COMPARISON:  None Available. FINDINGS: Brain: There is no mass, hemorrhage or extra-axial collection. The size and configuration of the ventricles and extra-axial CSF spaces are normal. The brain parenchyma is normal, without acute or chronic infarction. No abnormal enhancement. Vascular: No abnormal hyperdensity of the major intracranial arteries or dural venous sinuses. No intracranial atherosclerosis. Skull: The visualized skull base, calvarium and extracranial soft tissues are normal. Sinuses/Orbits: No fluid levels or advanced mucosal thickening of the visualized paranasal sinuses. No mastoid  or middle ear effusion. The orbits are normal. IMPRESSION: Normal head CT. Electronically Signed   By: Ulyses Jarred M.D.   On: 10/11/2022 23:08   CT ABDOMEN PELVIS W CONTRAST  Result Date: 10/11/2022 CLINICAL DATA:  Metastatic disease evaluation EXAM: CT ABDOMEN AND PELVIS WITH CONTRAST TECHNIQUE: Multidetector CT imaging of the abdomen and pelvis was performed using the standard protocol following bolus administration of intravenous contrast. RADIATION DOSE REDUCTION: This exam was performed according to the departmental dose-optimization program which includes automated exposure control, adjustment of the mA and/or kV according to patient size and/or use of iterative reconstruction technique. CONTRAST:  79mL OMNIPAQUE IOHEXOL 350 MG/ML SOLN COMPARISON:  CT angiogram chest 10/11/2022 FINDINGS: Lower chest: There is atelectasis in the lung bases. Left lower lobe mass is partially imaged. Please see dedicated CT chest performed same day. Hepatobiliary: There are  3 hypodensities in the left lobe of the liver measuring up to 15 mm, indeterminate given chest findings. There is no biliary ductal dilatation. The gallbladder is within normal limits. Pancreas: Unremarkable. No pancreatic ductal dilatation or surrounding inflammatory changes. Spleen: Normal in size without focal abnormality. Adrenals/Urinary Tract: Bilateral renal cortical cysts are present measuring up to 3.5 cm. There is no hydronephrosis or perinephric fat stranding. The adrenal glands and bladder are within normal limits. The bladder is distended. Stomach/Bowel: There is a small hiatal hernia. Stomach is otherwise within normal limits. Appendix is not seen. No evidence of bowel wall thickening, distention, or inflammatory changes. There is sigmoid colon diverticulosis. Vascular/Lymphatic: No significant vascular findings are present. No enlarged abdominal or pelvic lymph nodes. Reproductive: Prostate gland is mildly enlarged. Other: No abdominal wall hernia or abnormality. No abdominopelvic ascites. Musculoskeletal: No acute or significant osseous findings. IMPRESSION: 1. Three hypodense lesions in the left lobe of the liver measuring up to 15 mm, indeterminate. These may represent cysts or hemangiomas, but other etiologies such as metastatic disease are not excluded given findings in the chest. Consider further evaluation with MRI. 2. No other evidence for metastatic disease in the abdomen or pelvis. 3. Small hiatal hernia. 4. Sigmoid colon diverticulosis. 5. Bilateral renal cysts.  No follow-up necessary. Electronically Signed   By: Ronney Asters M.D.   On: 10/11/2022 21:37   DG Chest Port 1 View  Result Date: 10/11/2022 CLINICAL DATA:  Post bronchoscopy with biopsy EXAM: PORTABLE CHEST 1 VIEW COMPARISON:  Portable exam 1704 hours compared to CT chest 10/11/2022 FINDINGS: Normal heart size and pulmonary vascularity. Again identified LEFT hilar enlargement/adenopathy and LEFT perihilar mass. Minimal  infiltrate in LEFT lung adjacent to mass. Remaining lungs clear. No pleural effusion or pneumothorax. IMPRESSION: No pneumothorax following bronchoscopy. LEFT hilar adenopathy and LEFT perihilar mass again identified with mild LEFT perihilar edema postprocedure. Electronically Signed   By: Lavonia Dana M.D.   On: 10/11/2022 17:23   ECHOCARDIOGRAM COMPLETE  Result Date: 10/11/2022    ECHOCARDIOGRAM REPORT   Patient Name:   [facility] Greenley Vanbergen Date of Exam: 10/11/2022 Medical Rec #:  [id number]  Height:       68.0 in Accession #:    [id number] Weight:       165.3 lb Date of Birth:  1961/01/07   BSA:          1.885 m Patient Age:    62 years   BP:           134/80 mmHg Patient Gender: M          HR:  87 bpm. Exam Location:  [facility] Procedure: 2D Echo, Cardiac Doppler and Color Doppler Indications:     Atrial Fibrillation I48.91  History:         Patient has no prior history of Echocardiogram examinations.                  Risk Factors:Hypertension and Dyslipidemia.  Sonographer:     Sherrie Sport Referring Phys:  [id number] [place] Diagnosing Phys: Ida Rogue MD  Sonographer Comments: Image quality was fair. IMPRESSIONS  1. Left ventricular ejection fraction, by estimation, is 60 to 65%. The left ventricle has normal function. The left ventricle has no regional wall motion abnormalities. Left ventricular diastolic parameters are consistent with Grade I diastolic dysfunction (impaired relaxation).  2. Right ventricular systolic function is normal. The right ventricular size is normal.  3. The mitral valve is normal in structure. No evidence of mitral valve regurgitation. No evidence of mitral stenosis.  4. The aortic valve is normal in structure. Aortic valve regurgitation is not visualized. No aortic stenosis is present.  5. There is borderline dilatation of the aortic root, measuring 37 mm.  6. The inferior vena cava is normal in size with greater than 50% respiratory variability, suggesting right atrial  pressure of 3 mmHg. FINDINGS  Left Ventricle: Left ventricular ejection fraction, by estimation, is 60 to 65%. The left ventricle has normal function. The left ventricle has no regional wall motion abnormalities. The left ventricular internal cavity size was normal in size. There is  no left ventricular hypertrophy. Left ventricular diastolic parameters are consistent with Grade I diastolic dysfunction (impaired relaxation). Right Ventricle: The right ventricular size is normal. No increase in right ventricular wall thickness. Right ventricular systolic function is normal. Left Atrium: Left atrial size was normal in size. Right Atrium: Right atrial size was normal in size. Pericardium: There is no evidence of pericardial effusion. Mitral Valve: The mitral valve is normal in structure. No evidence of mitral valve regurgitation. No evidence of mitral valve stenosis. Tricuspid Valve: The tricuspid valve is normal in structure. Tricuspid valve regurgitation is mild . No evidence of tricuspid stenosis. Aortic Valve: The aortic valve is normal in structure. Aortic valve regurgitation is not visualized. No aortic stenosis is present. Aortic valve mean gradient measures 4.0 mmHg. Aortic valve peak gradient measures 6.2 mmHg. Aortic valve area, by VTI measures 3.33 cm. Pulmonic Valve: The pulmonic valve was normal in structure. Pulmonic valve regurgitation is not visualized. No evidence of pulmonic stenosis. Aorta: The aortic root is normal in size and structure. There is borderline dilatation of the aortic root, measuring 37 mm. Venous: The inferior vena cava is normal in size with greater than 50% respiratory variability, suggesting right atrial pressure of 3 mmHg. IAS/Shunts: No atrial level shunt detected by color flow Doppler.  LEFT VENTRICLE PLAX 2D LVIDd:         3.70 cm   Diastology LVIDs:         2.50 cm   LV e' medial:    9.14 cm/s LV PW:         1.10 cm   LV E/e' medial:  9.5 LV IVS:        1.00 cm   LV e'  lateral:   8.49 cm/s LVOT diam:     2.00 cm   LV E/e' lateral: 10.2 LV SV:         84 LV SV Index:   45 LVOT Area:     3.14  cm  RIGHT VENTRICLE RV Basal diam:  3.90 cm RV Mid diam:    4.10 cm RV S prime:     16.50 cm/s TAPSE (M-mode): 2.7 cm LEFT ATRIUM           Index        RIGHT ATRIUM           Index LA diam:      2.20 cm 1.17 cm/m   RA Area:     11.50 cm LA Vol (A2C): 63.7 ml 33.79 ml/m  RA Volume:   24.40 ml  12.94 ml/m LA Vol (A4C): 25.5 ml 13.53 ml/m  AORTIC VALVE AV Area (Vmax):    3.44 cm AV Area (Vmean):   3.40 cm AV Area (VTI):     3.33 cm AV Vmax:           125.00 cm/s AV Vmean:          95.200 cm/s AV VTI:            0.253 m AV Peak Grad:      6.2 mmHg AV Mean Grad:      4.0 mmHg LVOT Vmax:         137.00 cm/s LVOT Vmean:        103.000 cm/s LVOT VTI:          0.268 m LVOT/AV VTI ratio: 1.06  AORTA Ao Root diam: 3.70 cm MITRAL VALVE               TRICUSPID VALVE MV Area (PHT): 5.23 cm    TR Peak grad:   19.9 mmHg MV Decel Time: 145 msec    TR Vmax:        223.00 cm/s MV E velocity: 86.50 cm/s MV A velocity: 91.70 cm/s  SHUNTS MV E/A ratio:  0.94        Systemic VTI:  0.27 m                            Systemic Diam: 2.00 cm Ida Rogue MD Electronically signed by Ida Rogue MD Signature Date/Time: 10/11/2022/5:11:44 PM    Final    CT Angio Chest Pulmonary Embolism (PE) W or WO Contrast  Result Date: 10/11/2022 CLINICAL DATA:  Pulmonary embolism (PE) suspected, high prob EXAM: CT ANGIOGRAPHY CHEST WITH CONTRAST TECHNIQUE: Multidetector CT imaging of the chest was performed using the standard protocol during bolus administration of intravenous contrast. Multiplanar CT image reconstructions and MIPs were obtained to evaluate the vascular anatomy. RADIATION DOSE REDUCTION: This exam was performed according to the departmental dose-optimization program which includes automated exposure control, adjustment of the mA and/or kV according to patient size and/or use of iterative reconstruction  technique. CONTRAST:  54mL OMNIPAQUE IOHEXOL 350 MG/ML SOLN COMPARISON:  Chest x-ray 10/10/2022, CT chest 12/12/2008 FINDINGS: Cardiovascular: Satisfactory opacification of the pulmonary arteries to the segmental level. No evidence of pulmonary embolism. Limited evaluation of the subsegmental level due to motion artifact. Marked narrowing of the left pulmonary arteries due to hilar mass. The main pulmonary artery is normal in caliber. Normal heart size. No significant pericardial effusion. The thoracic aorta is normal in caliber. No atherosclerotic plaque of the thoracic aorta. No coronary artery calcifications. Mediastinum/Nodes: There is a at least 8.6 x 5.5 x 7.5 cm mass with irregular borders centered within the left hilar region with associated encasement of the left main pulmonary artery as well as the origin of all of the  left segmental pulmonary arteries. Associated narrowing of these arteries with almost complete occlusion (less than 1 mm opacification) of the left upper lobe pulmonary artery. The mass is also noted to encase the left or upper and lower lobe bronchi (4:78). Associated occlusion of the right upper lobe bronchi as well as internal debris. Left hilar mass versus conglomerate of lymphadenopathy extensive the pre-vascular region and middle of the mediastinum. Associated prevascular lymph node measuring 1 point 5 cm. As well as a 1.1 cm precarinal lymph node (4:69). No enlarged right hilar or axillary lymph nodes. Thyroid gland, trachea, and esophagus demonstrate no significant findings. Small hiatal hernia. Lungs/Pleura: Limited evaluation due to motion artifact. At least moderate centrilobular emphysematous changes. Diffuse bronchial wall thickening. Left upper lobe 2.4 x 1.2 cm pulmonary nodule (5:78). Left lower lobe spiculated 1.8 x 0.9 cm pulmonary nodule (4:52). Likely several satellite micronodules within the left upper lobe (4:68). Question tree-in-bud nodularity of the left lower lobe.  No pleural effusion. No pneumothorax. Upper Abdomen: Left renal fluid dense lesions-likely simple renal cysts. Simple renal cysts, in the absence of clinically indicated signs/symptoms, require no independent follow-up. Musculoskeletal: No chest wall abnormality. No suspicious lytic or blastic osseous lesions. No acute displaced fracture. Multilevel degenerative changes of the spine. Review of the MIP images confirms the above findings. IMPRESSION: 1. No evidence of pulmonary embolism. Limited evaluation of the subsegmental level due to motion artifact. 2. Left upper lobe 2.4 x 1.2 cm pulmonary nodule. Left lower lobe spiculated 1.8 x 0.9 cm pulmonary nodule. 3. At least 8.6 x 5.5 x 7.5 cm mass/conglomeraive lymphadenopathy centered within the left hilar region with associated encasement of the left main pulmonary artery, origin of all of the left segmental pulmonary arteries, left upper lobe and lower lobe bronchi. Mediastinal lymphadenopathy. 4. Associated marked narrowing of the left pulmonary arteries due to hilar mass. Almost complete occlusion (less than 1 mm opacification) of the left upper lobe pulmonary artery. 5. Associated occlusion of the right upper lobe bronchi as well as internal debris. 6. Question tree-in-bud nodularity of the left lower lobe with limited evaluation on this noncontrast study. This could represent infection/inflammation. 7. Small hiatal hernia. 8. Aortic Atherosclerosis (ICD10-I70.0) and Emphysema (ICD10-J43.9). 9. Additional imaging evaluation or consultation with Pulmonology or Thoracic Surgery recommended. Electronically Signed   By: Iven Finn M.D.   On: 10/11/2022 01:09   DG Chest 2 View  Result Date: 10/10/2022 CLINICAL DATA:  Shortness of breath, cough, and congestion EXAM: CHEST - 2 VIEW COMPARISON:  Chest x-ray October 17, 2014 FINDINGS: The cardiomediastinal silhouette is unchanged in contour. Bilateral interstitial prominence and scattered bronchial wall  thickening. Possible focal opacity in the left upper lobe seen on the AP view. No pleural effusion or pneumothorax. The visualized upper abdomen is unremarkable. Chronic right rib fracture deformities. IMPRESSION: Findings suggestive of small airways disease with possible superimposed pneumonia in the left upper lobe. Electronically Signed   By: Beryle Flock M.D.   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  Biopsy confirmed small cell lung carcinoma.  Patient could not tolerate MRI, but CT of the brain on October 11, 2022 is negative.  Patient is currently undergoing palliative XRT given his symptoms and encasement of pulmonary vessels.  Treatment will be completed on November 13, 2022.  Patient will also benefit from chemotherapy using cisplatin, etoposide, and Tecentriq with Udenyca support every 3 weeks.  Patient will receive 4 cycles of treatment and then will reimage.  Cycle 5 and beyond will be maintenance Tecentriq.  He will require port placement prior to initiating chemotherapy.  PET scan is scheduled for later this week.  Return to clinic in 2 weeks for further evaluation and consideration of cycle 1, day 1 of treatment.  Shortness of breath.  Continue supplemental oxygen as needed.  Liver lesions: Patient will require PET scan as above to complete the initial staging process.    I spent a total of 30 minutes reviewing chart data, face-to-face evaluation with the patient, counseling and coordination of care as detailed above.   Patient expressed understanding and was in agreement with this plan. He also understands that He can call clinic at any time with any questions, concerns, or  complaints.    Cancer Staging  Small cell lung cancer ([place]) Staging form: Lung, AJCC 8th Edition - Clinical: Stage IVA (cT4, cN2, cM1a) - Signed by Lloyd Huger, MD on 10/29/2022   Lloyd Huger, MD   10/29/2022 12:38 PM

## 2022-10-29 NOTE — Progress Notes (Signed)
Spoke with pt's sister, Trevor Montgomery, to inform of port placement that is scheduled for Monday 2/12 at 1:30pm. Instructed to arrive at 12:30pm at the [facility], nothing to eat/drink 6 hours before except meds with sips of water, and will need driver. Informed that his radiation on Monday 2/12 has been moved to 1145am as well.   Also, made aware that PET scan that was scheduled on Thurs 2/8 has been cancelled due to pending insurance authorization. Informed that once approved then will get his PET scan rescheduled.   Trevor Montgomery verbalized understanding. Nothing further needed at this time.

## 2022-10-29 NOTE — Progress Notes (Signed)
START ON PATHWAY REGIMEN - Small Cell Lung     Cycles 1 through 4, every 21 days:     Atezolizumab      Carboplatin      Etoposide    Cycles 5 and beyond, every 21 days:     Atezolizumab   **Always confirm dose/schedule in your pharmacy ordering system**  Patient Characteristics: Newly Diagnosed, Preoperative or Nonsurgical Candidate (Clinical Staging), First Line, Extensive Stage Therapeutic Status: Newly Diagnosed, Preoperative or Nonsurgical Candidate (Clinical Staging) AJCC T Category: cT4 AJCC N Category: cN2 AJCC M Category: cM1a AJCC 8 Stage Grouping: IVA Stage Classification: Extensive  Intent of Therapy: Non-Curative / Palliative Intent, Discussed with Patient

## 2022-10-29 NOTE — Progress Notes (Signed)
[place] Work  Clinical Social Work was referred by medical provider for assessment of psychosocial needs.  Clinical Social Worker attempted to contact patient by phone  to offer support and assess for needs.  CSW was unable to leave voicemail for patient, no answer.    FA  Adelene Amas, LCSW  Clinical Social Worker [facility]

## 2022-10-29 NOTE — Progress Notes (Signed)
Met with patient during follow up visit with [name]. All questions answered during visit. Reviewed upcoming appts. Informed will call with appt for port placement and chemotherapy start date. Contact info given and instructed to call with any questions or needs. Pt and his sister verbalized understanding. Nothing further needed at this time.

## 2022-10-30 ENCOUNTER — Other Ambulatory Visit: Payer: Self-pay

## 2022-10-30 ENCOUNTER — Ambulatory Visit
Admission: RE | Admit: 2022-10-30 | Discharge: 2022-10-30 | Disposition: A | Discharge status: Home / Self Care | Payer: Medicare Other | Source: Ambulatory Visit | Attending: Radiation Oncology | Admitting: Radiation Oncology

## 2022-10-30 ENCOUNTER — Other Ambulatory Visit: Payer: Self-pay | Admitting: Oncology

## 2022-10-30 DIAGNOSIS — Z510 Encounter for antineoplastic radiation therapy: Secondary | ICD-10-CM | POA: Diagnosis not present

## 2022-10-30 LAB — RAD ONC ARIA SESSION SUMMARY
Course Elapsed Days: 13
Plan Fractions Treated to Date: 10
Plan Prescribed Dose Per Fraction: 2 Gy
Plan Total Fractions Prescribed: 20
Plan Total Prescribed Dose: 40 Gy
Reference Point Dosage Given to Date: 20 Gy
Reference Point Session Dosage Given: 2 Gy
Session Number: 10

## 2022-10-31 ENCOUNTER — Other Ambulatory Visit: Payer: Self-pay

## 2022-10-31 ENCOUNTER — Ambulatory Visit
Admission: RE | Admit: 2022-10-31 | Discharge: 2022-10-31 | Disposition: A | Discharge status: Home / Self Care | Payer: Medicare Other | Source: Ambulatory Visit | Attending: Radiation Oncology | Admitting: Radiation Oncology

## 2022-10-31 ENCOUNTER — Encounter: Payer: Self-pay | Admitting: Oncology

## 2022-10-31 ENCOUNTER — Ambulatory Visit: Payer: Medicare Other

## 2022-10-31 ENCOUNTER — Inpatient Hospital Stay: Payer: Medicare Other

## 2022-10-31 DIAGNOSIS — R918 Other nonspecific abnormal finding of lung field: Secondary | ICD-10-CM

## 2022-10-31 DIAGNOSIS — Z510 Encounter for antineoplastic radiation therapy: Secondary | ICD-10-CM | POA: Diagnosis not present

## 2022-10-31 DIAGNOSIS — Z5111 Encounter for antineoplastic chemotherapy: Secondary | ICD-10-CM | POA: Diagnosis not present

## 2022-10-31 LAB — RAD ONC ARIA SESSION SUMMARY
Course Elapsed Days: 14
Plan Fractions Treated to Date: 11
Plan Prescribed Dose Per Fraction: 2 Gy
Plan Total Fractions Prescribed: 20
Plan Total Prescribed Dose: 40 Gy
Reference Point Dosage Given to Date: 22 Gy
Reference Point Session Dosage Given: 2 Gy
Session Number: 11

## 2022-10-31 LAB — CBC
HCT: 37.1 % — ABNORMAL LOW (ref 39.0–52.0)
Hemoglobin: 12.6 g/dL — ABNORMAL LOW (ref 13.0–17.0)
MCH: 27.9 pg (ref 26.0–34.0)
MCHC: 34 g/dL (ref 30.0–36.0)
MCV: 82.3 fL (ref 80.0–100.0)
Platelets: 359 10*3/uL (ref 150–400)
RBC: 4.51 MIL/uL (ref 4.22–5.81)
RDW: 13.2 % (ref 11.5–15.5)
WBC: 6.1 10*3/uL (ref 4.0–10.5)
nRBC: 0 % (ref 0.0–0.2)

## 2022-11-01 ENCOUNTER — Other Ambulatory Visit

## 2022-11-01 ENCOUNTER — Other Ambulatory Visit: Payer: Self-pay

## 2022-11-01 ENCOUNTER — Other Ambulatory Visit (HOSPITAL_COMMUNITY): Payer: Self-pay | Admitting: Student

## 2022-11-01 ENCOUNTER — Ambulatory Visit
Admission: RE | Admit: 2022-11-01 | Discharge: 2022-11-01 | Disposition: A | Discharge status: Home / Self Care | Payer: Medicare Other | Source: Ambulatory Visit | Attending: Radiation Oncology | Admitting: Radiation Oncology

## 2022-11-01 DIAGNOSIS — Z510 Encounter for antineoplastic radiation therapy: Secondary | ICD-10-CM | POA: Diagnosis not present

## 2022-11-01 LAB — RAD ONC ARIA SESSION SUMMARY
Course Elapsed Days: 15
Plan Fractions Treated to Date: 12
Plan Prescribed Dose Per Fraction: 2 Gy
Plan Total Fractions Prescribed: 20
Plan Total Prescribed Dose: 40 Gy
Reference Point Dosage Given to Date: 24 Gy
Reference Point Session Dosage Given: 2 Gy
Session Number: 12

## 2022-11-01 NOTE — Progress Notes (Signed)
Patient for IR Port Placement on Mon 11/04/2022, I called and spoke with the pt's sister Mariann Laster on the phone and gave pre-procedure instructions. Pt's sister was made aware to have the patient here at 12:30p at the new entrance, NPO after MN prior to procedure as well as driver post procedure/recovery/discharge. Pt's sister stated understanding. Called 11/01/2022

## 2022-11-04 ENCOUNTER — Ambulatory Visit
Admission: RE | Admit: 2022-11-04 | Discharge: 2022-11-04 | Disposition: A | Discharge status: Home / Self Care | Payer: Medicare Other | Source: Ambulatory Visit | Attending: Oncology | Admitting: Oncology

## 2022-11-04 ENCOUNTER — Ambulatory Visit: Payer: Medicare Other | Admitting: Radiology

## 2022-11-04 ENCOUNTER — Other Ambulatory Visit: Payer: Self-pay

## 2022-11-04 ENCOUNTER — Encounter: Payer: Self-pay | Admitting: Student in an Organized Health Care Education/Training Program

## 2022-11-04 ENCOUNTER — Ambulatory Visit
Admission: RE | Admit: 2022-11-04 | Discharge: 2022-11-04 | Disposition: A | Discharge status: Home / Self Care | Payer: Medicare Other | Source: Ambulatory Visit | Attending: Radiation Oncology | Admitting: Radiation Oncology

## 2022-11-04 DIAGNOSIS — C7951 Secondary malignant neoplasm of bone: Secondary | ICD-10-CM | POA: Insufficient documentation

## 2022-11-04 DIAGNOSIS — I10 Essential (primary) hypertension: Secondary | ICD-10-CM | POA: Diagnosis not present

## 2022-11-04 DIAGNOSIS — N400 Enlarged prostate without lower urinary tract symptoms: Secondary | ICD-10-CM | POA: Insufficient documentation

## 2022-11-04 DIAGNOSIS — C3490 Malignant neoplasm of unspecified part of unspecified bronchus or lung: Secondary | ICD-10-CM

## 2022-11-04 DIAGNOSIS — N210 Calculus in bladder: Secondary | ICD-10-CM | POA: Insufficient documentation

## 2022-11-04 DIAGNOSIS — J439 Emphysema, unspecified: Secondary | ICD-10-CM | POA: Insufficient documentation

## 2022-11-04 DIAGNOSIS — Z510 Encounter for antineoplastic radiation therapy: Secondary | ICD-10-CM | POA: Diagnosis not present

## 2022-11-04 DIAGNOSIS — Z87891 Personal history of nicotine dependence: Secondary | ICD-10-CM | POA: Diagnosis not present

## 2022-11-04 HISTORY — PX: IR IMAGING GUIDED PORT INSERTION: IMG5740

## 2022-11-04 LAB — RAD ONC ARIA SESSION SUMMARY
Course Elapsed Days: 18
Plan Fractions Treated to Date: 13
Plan Prescribed Dose Per Fraction: 2 Gy
Plan Total Fractions Prescribed: 20
Plan Total Prescribed Dose: 40 Gy
Reference Point Dosage Given to Date: 26 Gy
Reference Point Session Dosage Given: 2 Gy
Session Number: 13

## 2022-11-04 LAB — GLUCOSE, CAPILLARY: Glucose-Capillary: 110 mg/dL — ABNORMAL HIGH (ref 70–99)

## 2022-11-04 MED ORDER — SODIUM CHLORIDE 0.9 % IV SOLN: INTRAVENOUS | Status: DC

## 2022-11-04 MED ORDER — FENTANYL CITRATE (PF) 100 MCG/2ML IJ SOLN
INTRAMUSCULAR | Status: AC | PRN
  Administered 2022-11-04 (×2): 50 ug via INTRAVENOUS

## 2022-11-04 MED ORDER — FLUDEOXYGLUCOSE F - 18 (FDG) INJECTION
8.6000 | Freq: Once | INTRAVENOUS | Status: AC | PRN
  Administered 2022-11-04: 8.49 via INTRAVENOUS

## 2022-11-04 MED ORDER — MIDAZOLAM HCL 5 MG/5ML IJ SOLN
INTRAMUSCULAR | Status: AC | PRN
  Administered 2022-11-04: 1 mg via INTRAVENOUS
  Administered 2022-11-04: .5 mg via INTRAVENOUS

## 2022-11-04 MED ORDER — LIDOCAINE-EPINEPHRINE 1 %-1:100000 IJ SOLN
INTRAMUSCULAR | Status: AC
  Administered 2022-11-04: 15 mL via INTRADERMAL
  Filled 2022-11-04: qty 1

## 2022-11-04 MED ORDER — FENTANYL CITRATE (PF) 100 MCG/2ML IJ SOLN
INTRAMUSCULAR | Status: AC
  Filled 2022-11-04: qty 2

## 2022-11-04 MED ORDER — MIDAZOLAM HCL 2 MG/2ML IJ SOLN
INTRAMUSCULAR | Status: AC
  Filled 2022-11-04: qty 2

## 2022-11-04 MED ORDER — HEPARIN SOD (PORK) LOCK FLUSH 100 UNIT/ML IV SOLN
INTRAVENOUS | Status: AC
  Administered 2022-11-04: 500 [IU]
  Filled 2022-11-04: qty 5

## 2022-11-04 NOTE — Progress Notes (Signed)
Patient clinically stable post Port placement per Dr Denna Haggard, tolerated well. Vitals stable pre and post procedure. Received Versed 1.5 mg along with Fentanyl 100 mcg IV for procedure. Sister at bedside post procedure with update given, as well as  discharge instructions given. Denies complaints at this time.

## 2022-11-04 NOTE — Progress Notes (Signed)
Patient remains clinically stable, no complaints, ready for discharge. Accompanied per sister/Wanda.

## 2022-11-04 NOTE — H&P (Signed)
Chief Complaint: Patient was seen in consultation today for image-guided port placement  Referring Physician(s): Finnegan,Timothy J  Supervising Physician: Juliet Rude  Patient Status: [facility] - Out-pt  History of Present Illness: Trevor Montgomery is a 62 y.o. 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 The patient is followed by Dr Grayland Ormond from Oncology who has referred the patient to IR for port placement to facilitate chemotherapy.   Past Medical History:  Diagnosis Date   Anxiety    Arthritis    Emphysema of lung ([place])    HTN (hypertension)    Hyperlipemia    Schizoaffective disorder, bipolar type ([place])    Substance abuse ([place])     Past Surgical History:  Procedure Laterality Date   FINGER SURGERY     VIDEO BRONCHOSCOPY WITH ENDOBRONCHIAL ULTRASOUND Left 10/11/2022   Procedure: VIDEO BRONCHOSCOPY WITH ENDOBRONCHIAL ULTRASOUND;  Surgeon: Armando Reichert, MD;  Location: [facility] ORS;  Service: Pulmonary;  Laterality: Left;    Allergies: Patient has no known allergies.  Medications: Prior to Admission medications   Medication Sig Start Date End Date Taking? Authorizing Provider  albuterol (VENTOLIN HFA) 108 (90 Base) MCG/ACT inhaler Inhale 2 puffs into the lungs every 6 (six) hours as needed for wheezing or shortness of breath. 09/13/22  Yes [provider]  amLODipine (NORVASC) 10 MG tablet Take 1 tablet (10 mg total) by mouth daily. 10/16/22  Yes Emeterio Reeve, DO  benztropine (COGENTIN) 2 MG tablet Take 2 mg by mouth 2 (two) times daily.   Yes [provider]  bisacodyl (DULCOLAX) 5 MG EC tablet Take 1 tablet (5 mg total) by mouth daily as needed for moderate constipation. 10/16/22  Yes Emeterio Reeve, DO  fluPHENAZine (PROLIXIN) 5 MG tablet Take 2.5 mg by mouth daily.   Yes [provider]  guaiFENesin (MUCINEX) 600 MG 12 hr tablet  Take 1 tablet (600 mg total) by mouth 2 (two) times daily as needed for cough. 10/16/22  Yes Emeterio Reeve, DO  meloxicam (MOBIC) 15 MG tablet Take 15 mg by mouth daily. 05/08/22 05/08/23 Yes [provider]  Multiple Vitamin (MULTI-VITAMIN) tablet Take 1 tablet by mouth daily.   Yes [provider]  ondansetron (ZOFRAN) 8 MG tablet Take 1 tablet (8 mg total) by mouth every 8 (eight) hours as needed for nausea, vomiting or refractory nausea / vomiting. Start on the third day after carboplatin. 10/29/22  Yes Lloyd Huger, MD  simvastatin (ZOCOR) 20 MG tablet Take 20 mg by mouth at bedtime. 05/02/22  Yes [provider]  Vitamin E 400 units TABS Take 400 Units by mouth daily.   Yes [provider]  docusate sodium (COLACE) 100 MG capsule Take 1 capsule (100 mg total) by mouth 2 (two) times daily as needed for mild constipation. 10/16/22   Emeterio Reeve, DO  lidocaine-prilocaine (EMLA) cream Apply to affected area once 10/29/22   Lloyd Huger, MD  nicotine (NICODERM CQ - DOSED IN MG/24 HOURS) 21 mg/24hr patch Place 21 mg onto the skin daily. 05/02/22   [provider]  prochlorperazine (COMPAZINE) 10 MG tablet Take 1 tablet (10 mg total) by mouth every 6 (six) hours as needed for nausea or vomiting. 10/29/22   Lloyd Huger, MD  propranolol (INDERAL) 10 MG tablet Take 0.5 tablets (5 mg total) by mouth 2 (two) times daily. 10/16/22   Emeterio Reeve, DO  Family History  Problem Relation Age of Onset   Stroke Father    Seizures Father    Stroke Sister    Cancer Maternal Grandmother     Social History   Socioeconomic History   Marital status: Single    Spouse name: Not on file   Number of children: Not on file   Years of education: Not on file   Highest education level: Not on file  Occupational History   Not on file  Tobacco Use   Smoking status: Former    Packs/day: 0.50    Years: 40.00    Total pack years: 20.00     Types: Cigarettes    Quit date: 10/15/2022    Years since quitting: 0.0   Smokeless tobacco: Former    Quit date: 10/15/2022  Vaping Use   Vaping Use: Not on file  Substance and Sexual Activity   Alcohol use: No   Drug use: No   Sexual activity: Not Currently  Other Topics Concern   Not on file  Social History Narrative   Not on file   Social Determinants of Health   Financial Resource Strain: Not on file  Food Insecurity: Food Insecurity Present (10/29/2022)   Hunger Vital Sign    Worried About Running Out of Food in the Last Year: Never true    Ran Out of Food in the Last Year: Sometimes true  Transportation Needs: No Transportation Needs (10/10/2022)   PRAPARE - Hydrologist (Medical): No    Lack of Transportation (Non-Medical): No  Physical Activity: Not on file  Stress: Not on file  Social Connections: Not on file    Code Status: Full Code  Review of Systems: A 12 point ROS discussed and pertinent positives are indicated in the HPI above.  All other systems are negative.  Review of Systems  Constitutional:  Negative for chills and fever.  Respiratory:  Positive for cough. Negative for chest tightness and shortness of breath.   Cardiovascular:  Negative for chest pain and leg swelling.  Gastrointestinal:  Negative for diarrhea, nausea and vomiting.  Neurological:  Negative for dizziness and headaches.  Psychiatric/Behavioral:  Negative for confusion.     Vital Signs: BP 124/77 (BP Location: Left Arm)   Pulse 94   Temp 97.9 F (36.6 C) (Oral)   Resp 16   SpO2 94%     Physical Exam Vitals reviewed.  Constitutional:      General: He is not in acute distress.    Appearance: He is ill-appearing.  HENT:     Mouth/Throat:     Mouth: Mucous membranes are moist.  Cardiovascular:     Rate and Rhythm: Normal rate and regular rhythm.     Pulses: Normal pulses.     Heart sounds: Normal heart sounds.  Pulmonary:     Effort: Pulmonary  effort is normal.     Breath sounds: Wheezing present.  Abdominal:     Palpations: Abdomen is soft.     Tenderness: There is no abdominal tenderness.  Musculoskeletal:     Right lower leg: No edema.     Left lower leg: No edema.  Skin:    General: Skin is warm and dry.  Neurological:     Mental Status: He is alert and oriented to person, place, and time.  Psychiatric:        Mood and Affect: Mood normal.        Behavior: Behavior normal.  Thought Content: Thought content normal.        Judgment: Judgment normal.     Imaging: CT HEAD W & WO CONTRAST (5MM)  Result Date: 10/11/2022 CLINICAL DATA:  Metastatic disease evaluation EXAM: CT HEAD WITHOUT AND WITH CONTRAST TECHNIQUE: Contiguous axial images were obtained from the base of the skull through the vertex without and with intravenous contrast. RADIATION DOSE REDUCTION: This exam was performed according to the departmental dose-optimization program which includes automated exposure control, adjustment of the mA and/or kV according to patient size and/or use of iterative reconstruction technique. CONTRAST:  11mL OMNIPAQUE IOHEXOL 350 MG/ML SOLN COMPARISON:  None Available. FINDINGS: Brain: There is no mass, hemorrhage or extra-axial collection. The size and configuration of the ventricles and extra-axial CSF spaces are normal. The brain parenchyma is normal, without acute or chronic infarction. No abnormal enhancement. Vascular: No abnormal hyperdensity of the major intracranial arteries or dural venous sinuses. No intracranial atherosclerosis. Skull: The visualized skull base, calvarium and extracranial soft tissues are normal. Sinuses/Orbits: No fluid levels or advanced mucosal thickening of the visualized paranasal sinuses. No mastoid or middle ear effusion. The orbits are normal. IMPRESSION: Normal head CT. Electronically Signed   By: Ulyses Jarred M.D.   On: 10/11/2022 23:08   CT ABDOMEN PELVIS W CONTRAST  Result Date:  10/11/2022 CLINICAL DATA:  Metastatic disease evaluation EXAM: CT ABDOMEN AND PELVIS WITH CONTRAST TECHNIQUE: Multidetector CT imaging of the abdomen and pelvis was performed using the standard protocol following bolus administration of intravenous contrast. RADIATION DOSE REDUCTION: This exam was performed according to the departmental dose-optimization program which includes automated exposure control, adjustment of the mA and/or kV according to patient size and/or use of iterative reconstruction technique. CONTRAST:  37mL OMNIPAQUE IOHEXOL 350 MG/ML SOLN COMPARISON:  CT angiogram chest 10/11/2022 FINDINGS: Lower chest: There is atelectasis in the lung bases. Left lower lobe mass is partially imaged. Please see dedicated CT chest performed same day. Hepatobiliary: There are 3 hypodensities in the left lobe of the liver measuring up to 15 mm, indeterminate given chest findings. There is no biliary ductal dilatation. The gallbladder is within normal limits. Pancreas: Unremarkable. No pancreatic ductal dilatation or surrounding inflammatory changes. Spleen: Normal in size without focal abnormality. Adrenals/Urinary Tract: Bilateral renal cortical cysts are present measuring up to 3.5 cm. There is no hydronephrosis or perinephric fat stranding. The adrenal glands and bladder are within normal limits. The bladder is distended. Stomach/Bowel: There is a small hiatal hernia. Stomach is otherwise within normal limits. Appendix is not seen. No evidence of bowel wall thickening, distention, or inflammatory changes. There is sigmoid colon diverticulosis. Vascular/Lymphatic: No significant vascular findings are present. No enlarged abdominal or pelvic lymph nodes. Reproductive: Prostate gland is mildly enlarged. Other: No abdominal wall hernia or abnormality. No abdominopelvic ascites. Musculoskeletal: No acute or significant osseous findings. IMPRESSION: 1. Three hypodense lesions in the left lobe of the liver measuring up  to 15 mm, indeterminate. These may represent cysts or hemangiomas, but other etiologies such as metastatic disease are not excluded given findings in the chest. Consider further evaluation with MRI. 2. No other evidence for metastatic disease in the abdomen or pelvis. 3. Small hiatal hernia. 4. Sigmoid colon diverticulosis. 5. Bilateral renal cysts.  No follow-up necessary. Electronically Signed   By: Ronney Asters M.D.   On: 10/11/2022 21:37   DG Chest Port 1 View  Result Date: 10/11/2022 CLINICAL DATA:  Post bronchoscopy with biopsy EXAM: PORTABLE CHEST 1 VIEW COMPARISON:  Portable  exam 1704 hours compared to CT chest 10/11/2022 FINDINGS: Normal heart size and pulmonary vascularity. Again identified LEFT hilar enlargement/adenopathy and LEFT perihilar mass. Minimal infiltrate in LEFT lung adjacent to mass. Remaining lungs clear. No pleural effusion or pneumothorax. IMPRESSION: No pneumothorax following bronchoscopy. LEFT hilar adenopathy and LEFT perihilar mass again identified with mild LEFT perihilar edema postprocedure. Electronically Signed   By: Lavonia Dana M.D.   On: 10/11/2022 17:23   ECHOCARDIOGRAM COMPLETE  Result Date: 10/11/2022    ECHOCARDIOGRAM REPORT   Patient Name:   Trevor Montgomery Date of Exam: 10/11/2022 Medical Rec #:  [id number]  Height:       68.0 in Accession #:    [id number] Weight:       165.3 lb Date of Birth:  1961/08/31   BSA:          1.885 m Patient Age:    47 years   BP:           134/80 mmHg Patient Gender: M          HR:           87 bpm. Exam Location:  [facility] Procedure: 2D Echo, Cardiac Doppler and Color Doppler Indications:     Atrial Fibrillation I48.91  History:         Patient has no prior history of Echocardiogram examinations.                  Risk Factors:Hypertension and Dyslipidemia.  Sonographer:     Sherrie Sport Referring Phys:  [id number] [place] Diagnosing Phys: Ida Rogue MD  Sonographer Comments: Image quality was fair. IMPRESSIONS  1. Left ventricular  ejection fraction, by estimation, is 60 to 65%. The left ventricle has normal function. The left ventricle has no regional wall motion abnormalities. Left ventricular diastolic parameters are consistent with Grade I diastolic dysfunction (impaired relaxation).  2. Right ventricular systolic function is normal. The right ventricular size is normal.  3. The mitral valve is normal in structure. No evidence of mitral valve regurgitation. No evidence of mitral stenosis.  4. The aortic valve is normal in structure. Aortic valve regurgitation is not visualized. No aortic stenosis is present.  5. There is borderline dilatation of the aortic root, measuring 37 mm.  6. The inferior vena cava is normal in size with greater than 50% respiratory variability, suggesting right atrial pressure of 3 mmHg. FINDINGS  Left Ventricle: Left ventricular ejection fraction, by estimation, is 60 to 65%. The left ventricle has normal function. The left ventricle has no regional wall motion abnormalities. The left ventricular internal cavity size was normal in size. There is  no left ventricular hypertrophy. Left ventricular diastolic parameters are consistent with Grade I diastolic dysfunction (impaired relaxation). Right Ventricle: The right ventricular size is normal. No increase in right ventricular wall thickness. Right ventricular systolic function is normal. Left Atrium: Left atrial size was normal in size. Right Atrium: Right atrial size was normal in size. Pericardium: There is no evidence of pericardial effusion. Mitral Valve: The mitral valve is normal in structure. No evidence of mitral valve regurgitation. No evidence of mitral valve stenosis. Tricuspid Valve: The tricuspid valve is normal in structure. Tricuspid valve regurgitation is mild . No evidence of tricuspid stenosis. Aortic Valve: The aortic valve is normal in structure. Aortic valve regurgitation is not visualized. No aortic stenosis is present. Aortic valve mean  gradient measures 4.0 mmHg. Aortic valve peak gradient measures 6.2 mmHg. Aortic valve area, by VTI  measures 3.33 cm. Pulmonic Valve: The pulmonic valve was normal in structure. Pulmonic valve regurgitation is not visualized. No evidence of pulmonic stenosis. Aorta: The aortic root is normal in size and structure. There is borderline dilatation of the aortic root, measuring 37 mm. Venous: The inferior vena cava is normal in size with greater than 50% respiratory variability, suggesting right atrial pressure of 3 mmHg. IAS/Shunts: No atrial level shunt detected by color flow Doppler.  LEFT VENTRICLE PLAX 2D LVIDd:         3.70 cm   Diastology LVIDs:         2.50 cm   LV e' medial:    9.14 cm/s LV PW:         1.10 cm   LV E/e' medial:  9.5 LV IVS:        1.00 cm   LV e' lateral:   8.49 cm/s LVOT diam:     2.00 cm   LV E/e' lateral: 10.2 LV SV:         84 LV SV Index:   45 LVOT Area:     3.14 cm  RIGHT VENTRICLE RV Basal diam:  3.90 cm RV Mid diam:    4.10 cm RV S prime:     16.50 cm/s TAPSE (M-mode): 2.7 cm LEFT ATRIUM           Index        RIGHT ATRIUM           Index LA diam:      2.20 cm 1.17 cm/m   RA Area:     11.50 cm LA Vol (A2C): 63.7 ml 33.79 ml/m  RA Volume:   24.40 ml  12.94 ml/m LA Vol (A4C): 25.5 ml 13.53 ml/m  AORTIC VALVE AV Area (Vmax):    3.44 cm AV Area (Vmean):   3.40 cm AV Area (VTI):     3.33 cm AV Vmax:           125.00 cm/s AV Vmean:          95.200 cm/s AV VTI:            0.253 m AV Peak Grad:      6.2 mmHg AV Mean Grad:      4.0 mmHg LVOT Vmax:         137.00 cm/s LVOT Vmean:        103.000 cm/s LVOT VTI:          0.268 m LVOT/AV VTI ratio: 1.06  AORTA Ao Root diam: 3.70 cm MITRAL VALVE               TRICUSPID VALVE MV Area (PHT): 5.23 cm    TR Peak grad:   19.9 mmHg MV Decel Time: 145 msec    TR Vmax:        223.00 cm/s MV E velocity: 86.50 cm/s MV A velocity: 91.70 cm/s  SHUNTS MV E/A ratio:  0.94        Systemic VTI:  0.27 m                            Systemic Diam: 2.00 cm  Ida Rogue MD Electronically signed by Ida Rogue MD Signature Date/Time: 10/11/2022/5:11:44 PM    Final    CT Angio Chest Pulmonary Embolism (PE) W or WO Contrast  Result Date: 10/11/2022 CLINICAL DATA:  Pulmonary embolism (PE) suspected, high prob EXAM: CT ANGIOGRAPHY CHEST WITH CONTRAST  TECHNIQUE: Multidetector CT imaging of the chest was performed using the standard protocol during bolus administration of intravenous contrast. Multiplanar CT image reconstructions and MIPs were obtained to evaluate the vascular anatomy. RADIATION DOSE REDUCTION: This exam was performed according to the departmental dose-optimization program which includes automated exposure control, adjustment of the mA and/or kV according to patient size and/or use of iterative reconstruction technique. CONTRAST:  34mL OMNIPAQUE IOHEXOL 350 MG/ML SOLN COMPARISON:  Chest x-ray 10/10/2022, CT chest 12/12/2008 FINDINGS: Cardiovascular: Satisfactory opacification of the pulmonary arteries to the segmental level. No evidence of pulmonary embolism. Limited evaluation of the subsegmental level due to motion artifact. Marked narrowing of the left pulmonary arteries due to hilar mass. The main pulmonary artery is normal in caliber. Normal heart size. No significant pericardial effusion. The thoracic aorta is normal in caliber. No atherosclerotic plaque of the thoracic aorta. No coronary artery calcifications. Mediastinum/Nodes: There is a at least 8.6 x 5.5 x 7.5 cm mass with irregular borders centered within the left hilar region with associated encasement of the left main pulmonary artery as well as the origin of all of the left segmental pulmonary arteries. Associated narrowing of these arteries with almost complete occlusion (less than 1 mm opacification) of the left upper lobe pulmonary artery. The mass is also noted to encase the left or upper and lower lobe bronchi (4:78). Associated occlusion of the right upper lobe bronchi as well as  internal debris. Left hilar mass versus conglomerate of lymphadenopathy extensive the pre-vascular region and middle of the mediastinum. Associated prevascular lymph node measuring 1 point 5 cm. As well as a 1.1 cm precarinal lymph node (4:69). No enlarged right hilar or axillary lymph nodes. Thyroid gland, trachea, and esophagus demonstrate no significant findings. Small hiatal hernia. Lungs/Pleura: Limited evaluation due to motion artifact. At least moderate centrilobular emphysematous changes. Diffuse bronchial wall thickening. Left upper lobe 2.4 x 1.2 cm pulmonary nodule (5:78). Left lower lobe spiculated 1.8 x 0.9 cm pulmonary nodule (4:52). Likely several satellite micronodules within the left upper lobe (4:68). Question tree-in-bud nodularity of the left lower lobe. No pleural effusion. No pneumothorax. Upper Abdomen: Left renal fluid dense lesions-likely simple renal cysts. Simple renal cysts, in the absence of clinically indicated signs/symptoms, require no independent follow-up. Musculoskeletal: No chest wall abnormality. No suspicious lytic or blastic osseous lesions. No acute displaced fracture. Multilevel degenerative changes of the spine. Review of the MIP images confirms the above findings. IMPRESSION: 1. No evidence of pulmonary embolism. Limited evaluation of the subsegmental level due to motion artifact. 2. Left upper lobe 2.4 x 1.2 cm pulmonary nodule. Left lower lobe spiculated 1.8 x 0.9 cm pulmonary nodule. 3. At least 8.6 x 5.5 x 7.5 cm mass/conglomeraive lymphadenopathy centered within the left hilar region with associated encasement of the left main pulmonary artery, origin of all of the left segmental pulmonary arteries, left upper lobe and lower lobe bronchi. Mediastinal lymphadenopathy. 4. Associated marked narrowing of the left pulmonary arteries due to hilar mass. Almost complete occlusion (less than 1 mm opacification) of the left upper lobe pulmonary artery. 5. Associated occlusion of  the right upper lobe bronchi as well as internal debris. 6. Question tree-in-bud nodularity of the left lower lobe with limited evaluation on this noncontrast study. This could represent infection/inflammation. 7. Small hiatal hernia. 8. Aortic Atherosclerosis (ICD10-I70.0) and Emphysema (ICD10-J43.9). 9. Additional imaging evaluation or consultation with Pulmonology or Thoracic Surgery recommended. Electronically Signed   By: Iven Finn M.D.   On: 10/11/2022 01:09  DG Chest 2 View  Result Date: 10/10/2022 CLINICAL DATA:  Shortness of breath, cough, and congestion EXAM: CHEST - 2 VIEW COMPARISON:  Chest x-ray October 17, 2014 FINDINGS: The cardiomediastinal silhouette is unchanged in contour. Bilateral interstitial prominence and scattered bronchial wall thickening. Possible focal opacity in the left upper lobe seen on the AP view. No pleural effusion or pneumothorax. The visualized upper abdomen is unremarkable. Chronic right rib fracture deformities. IMPRESSION: Findings suggestive of small airways disease with possible superimposed pneumonia in the left upper lobe. Electronically Signed   By: Beryle Flock M.D.   On: 10/10/2022 17:57    Labs:  CBC: Recent Labs    10/14/22 0437 10/15/22 0451 10/16/22 0434 10/31/22 1245  WBC 8.1 8.1 8.2 6.1  HGB 11.2* 12.2* 11.4* 12.6*  HCT 34.1* 37.6* 36.0* 37.1*  PLT 303 337 321 359    COAGS: No results for input(s): "INR", "APTT" in the last 8760 hours.  BMP: Recent Labs    10/13/22 0525 10/14/22 0437 10/15/22 0451 10/16/22 0434  NA 130* 130* 131* 133*  K 3.5 3.5 3.7 3.8  CL 95* 92* 92* 96*  CO2 28 30 30  32  GLUCOSE 87 81 94 94  BUN 12 6 9 11   CALCIUM 8.2* 8.2* 8.5* 8.5*  CREATININE 0.50* 0.38* 0.46* 0.50*  GFRNONAA >60 >60 >60 >60    LIVER FUNCTION TESTS: Recent Labs    10/10/22 1730  BILITOT 0.8  AST 28  ALT 23  ALKPHOS 56  PROT 6.8  ALBUMIN 3.9    TUMOR MARKERS: No results for input(s): "AFPTM", "CEA", "CA199",  "CHROMGRNA" in the last 8760 hours.  Assessment and Plan:  Trevor Montgomery is a 62 yo male being seen today in relation to a need for IV chemotherapy. The patient has been recently diagnosed with lung cancer, and Dr Grayland Ormond has referred the patient to IR service for image-guided port placement. The patient today presents in his usual state of health.  Risks and benefits of image guided port-a-catheter placement was discussed with the patient including, but not limited to bleeding, infection, pneumothorax, or fibrin sheath development and need for additional procedures.  All of the patient's questions were answered, patient is agreeable to proceed. Consent signed and in chart.   Thank you for this interesting consult.  I greatly enjoyed meeting Trevor Montgomery and look forward to participating in their care.  A copy of this report was sent to the requesting provider on this date.  Electronically Signed: Lura Em, PA-C 11/04/2022, 2:01 PM   I spent a total of 40 Minutes   in face to face in clinical consultation, greater than 50% of which was counseling/coordinating care for image-guided port placement.

## 2022-11-04 NOTE — Procedures (Signed)
Interventional Radiology Procedure Note  Date of Procedure: 11/04/2022  Procedure: Port placement   Findings:  1. Right chest port placement    Complications: No immediate complications noted.   Estimated Blood Loss: minimal  Follow-up and Recommendations: 1. Ready for use    Albin Felling, MD  Vascular & Interventional Radiology  11/04/2022 3:16 PM

## 2022-11-05 ENCOUNTER — Inpatient Hospital Stay: Payer: Medicare Other

## 2022-11-05 ENCOUNTER — Ambulatory Visit
Admission: RE | Admit: 2022-11-05 | Discharge: 2022-11-05 | Disposition: A | Discharge status: Home / Self Care | Payer: Medicare Other | Source: Ambulatory Visit | Attending: Radiation Oncology | Admitting: Radiation Oncology

## 2022-11-05 ENCOUNTER — Encounter: Payer: Self-pay | Admitting: Licensed Clinical Social Worker

## 2022-11-05 ENCOUNTER — Other Ambulatory Visit: Payer: Self-pay

## 2022-11-05 DIAGNOSIS — Z510 Encounter for antineoplastic radiation therapy: Secondary | ICD-10-CM | POA: Diagnosis not present

## 2022-11-05 LAB — RAD ONC ARIA SESSION SUMMARY
Course Elapsed Days: 19
Plan Fractions Treated to Date: 14
Plan Prescribed Dose Per Fraction: 2 Gy
Plan Total Fractions Prescribed: 20
Plan Total Prescribed Dose: 40 Gy
Reference Point Dosage Given to Date: 28 Gy
Reference Point Session Dosage Given: 2 Gy
Session Number: 14

## 2022-11-05 NOTE — Progress Notes (Signed)
[place] Work  Clinical Social Work was referred by medical provider for assessment of psychosocial needs.  Clinical Social Worker contacted patient by phone  to offer support and assess for needs.  CSW was unable to leave a voicemail, patient does not have voicemail set up.     SA  Adelene Amas, LCSW  Clinical Social Worker [facility]

## 2022-11-06 ENCOUNTER — Ambulatory Visit
Admission: RE | Admit: 2022-11-06 | Discharge: 2022-11-06 | Disposition: A | Discharge status: Home / Self Care | Payer: Medicare Other | Source: Ambulatory Visit | Attending: Radiation Oncology | Admitting: Radiation Oncology

## 2022-11-06 ENCOUNTER — Other Ambulatory Visit: Payer: Self-pay

## 2022-11-06 ENCOUNTER — Other Ambulatory Visit: Payer: Self-pay | Admitting: *Deleted

## 2022-11-06 DIAGNOSIS — R918 Other nonspecific abnormal finding of lung field: Secondary | ICD-10-CM

## 2022-11-06 DIAGNOSIS — C7951 Secondary malignant neoplasm of bone: Secondary | ICD-10-CM

## 2022-11-06 DIAGNOSIS — Z510 Encounter for antineoplastic radiation therapy: Secondary | ICD-10-CM | POA: Diagnosis not present

## 2022-11-06 LAB — RAD ONC ARIA SESSION SUMMARY
Course Elapsed Days: 20
Plan Fractions Treated to Date: 15
Plan Prescribed Dose Per Fraction: 2 Gy
Plan Total Fractions Prescribed: 20
Plan Total Prescribed Dose: 40 Gy
Reference Point Dosage Given to Date: 30 Gy
Reference Point Session Dosage Given: 2 Gy
Session Number: 15

## 2022-11-06 MED ORDER — DEXAMETHASONE 4 MG PO TABS: 4.0000 mg | ORAL_TABLET | Freq: Every day | ORAL | 1 refills | Status: DC

## 2022-11-06 MED ORDER — HYDROCODONE-ACETAMINOPHEN 5-325 MG PO TABS: 1.0000 | ORAL_TABLET | Freq: Four times a day (QID) | ORAL | 0 refills | Status: DC | PRN

## 2022-11-06 NOTE — Telephone Encounter (Signed)
Spoke with pt's sister in the lobby while pt here for radiation treatment. Pt's sister states that pt is having increased pain in his left leg that is not relieved with tylenol and ibuprofen. Per [name], will send in rx for decadron 4mg  daily and norco 1-2 tabs q6 hours for pain.

## 2022-11-07 ENCOUNTER — Other Ambulatory Visit: Payer: Self-pay

## 2022-11-07 ENCOUNTER — Encounter: Payer: Self-pay | Admitting: *Deleted

## 2022-11-07 ENCOUNTER — Ambulatory Visit
Admission: RE | Admit: 2022-11-07 | Discharge: 2022-11-07 | Disposition: A | Discharge status: Home / Self Care | Payer: Medicare Other | Source: Ambulatory Visit | Attending: Radiation Oncology | Admitting: Radiation Oncology

## 2022-11-07 ENCOUNTER — Inpatient Hospital Stay: Payer: Medicare Other

## 2022-11-07 DIAGNOSIS — Z5111 Encounter for antineoplastic chemotherapy: Secondary | ICD-10-CM | POA: Diagnosis not present

## 2022-11-07 DIAGNOSIS — R918 Other nonspecific abnormal finding of lung field: Secondary | ICD-10-CM

## 2022-11-07 DIAGNOSIS — Z510 Encounter for antineoplastic radiation therapy: Secondary | ICD-10-CM | POA: Diagnosis not present

## 2022-11-07 LAB — RAD ONC ARIA SESSION SUMMARY
Course Elapsed Days: 21
Plan Fractions Treated to Date: 16
Plan Prescribed Dose Per Fraction: 2 Gy
Plan Total Fractions Prescribed: 20
Plan Total Prescribed Dose: 40 Gy
Reference Point Dosage Given to Date: 32 Gy
Reference Point Session Dosage Given: 2 Gy
Session Number: 16

## 2022-11-07 LAB — CBC
HCT: 42.5 % (ref 39.0–52.0)
Hemoglobin: 13.9 g/dL (ref 13.0–17.0)
MCH: 27.2 pg (ref 26.0–34.0)
MCHC: 32.7 g/dL (ref 30.0–36.0)
MCV: 83.2 fL (ref 80.0–100.0)
Platelets: 233 10*3/uL (ref 150–400)
RBC: 5.11 MIL/uL (ref 4.22–5.81)
RDW: 13.5 % (ref 11.5–15.5)
WBC: 12.9 10*3/uL — ABNORMAL HIGH (ref 4.0–10.5)
nRBC: 0 % (ref 0.0–0.2)

## 2022-11-07 NOTE — Progress Notes (Signed)
Met with patient and his sister in the lobby prior to his radiation treatment. Reviewed results from PET scan. Informed for pt to keep all appts as scheduled. Appt scheduled with Josh to see pt for supportive care. Pt's sister voiced concerns about caring for patient and feelings of being overwhelmed. Reassurance provided and reviewed supportive services available such as social work. Pt's sister declined social work referral at this time. Informed pt's sister that will get her application for assistance grant to give to her at pt's appt next week. Nothing further needed at this time. Instructed to call with any questions or needs. Pt's sister verbalized understanding.

## 2022-11-08 ENCOUNTER — Other Ambulatory Visit: Payer: Self-pay

## 2022-11-08 ENCOUNTER — Ambulatory Visit
Admission: RE | Admit: 2022-11-08 | Discharge: 2022-11-08 | Disposition: A | Discharge status: Home / Self Care | Payer: Medicare Other | Source: Ambulatory Visit | Attending: Radiation Oncology | Admitting: Radiation Oncology

## 2022-11-08 DIAGNOSIS — Z510 Encounter for antineoplastic radiation therapy: Secondary | ICD-10-CM | POA: Diagnosis not present

## 2022-11-08 LAB — RAD ONC ARIA SESSION SUMMARY
Course Elapsed Days: 22
Plan Fractions Treated to Date: 17
Plan Prescribed Dose Per Fraction: 2 Gy
Plan Total Fractions Prescribed: 20
Plan Total Prescribed Dose: 40 Gy
Reference Point Dosage Given to Date: 34 Gy
Reference Point Session Dosage Given: 2 Gy
Session Number: 17

## 2022-11-11 ENCOUNTER — Other Ambulatory Visit: Payer: Self-pay

## 2022-11-11 ENCOUNTER — Ambulatory Visit
Admission: RE | Admit: 2022-11-11 | Discharge: 2022-11-11 | Disposition: A | Discharge status: Home / Self Care | Payer: Medicare Other | Attending: Hospice and Palliative Medicine | Admitting: Hospice and Palliative Medicine

## 2022-11-11 ENCOUNTER — Telehealth: Payer: Self-pay | Admitting: *Deleted

## 2022-11-11 ENCOUNTER — Inpatient Hospital Stay (HOSPITAL_BASED_OUTPATIENT_CLINIC_OR_DEPARTMENT_OTHER): Payer: Medicare Other | Admitting: Hospice and Palliative Medicine

## 2022-11-11 ENCOUNTER — Inpatient Hospital Stay: Payer: Medicare Other

## 2022-11-11 ENCOUNTER — Ambulatory Visit
Admission: RE | Admit: 2022-11-11 | Discharge: 2022-11-11 | Disposition: A | Discharge status: Home / Self Care | Payer: Medicare Other | Source: Ambulatory Visit | Attending: Hospice and Palliative Medicine | Admitting: Hospice and Palliative Medicine

## 2022-11-11 ENCOUNTER — Ambulatory Visit
Admission: RE | Admit: 2022-11-11 | Discharge: 2022-11-11 | Disposition: A | Discharge status: Home / Self Care | Payer: Medicare Other | Source: Ambulatory Visit | Attending: Radiation Oncology | Admitting: Radiation Oncology

## 2022-11-11 VITALS — BP 141/96 | HR 93 | Temp 96.8°F | Resp 24

## 2022-11-11 DIAGNOSIS — R062 Wheezing: Secondary | ICD-10-CM | POA: Diagnosis present

## 2022-11-11 DIAGNOSIS — C3490 Malignant neoplasm of unspecified part of unspecified bronchus or lung: Secondary | ICD-10-CM

## 2022-11-11 DIAGNOSIS — R0602 Shortness of breath: Secondary | ICD-10-CM | POA: Diagnosis present

## 2022-11-11 DIAGNOSIS — Z510 Encounter for antineoplastic radiation therapy: Secondary | ICD-10-CM | POA: Diagnosis not present

## 2022-11-11 DIAGNOSIS — Z5111 Encounter for antineoplastic chemotherapy: Secondary | ICD-10-CM | POA: Diagnosis not present

## 2022-11-11 DIAGNOSIS — G893 Neoplasm related pain (acute) (chronic): Secondary | ICD-10-CM

## 2022-11-11 LAB — RAD ONC ARIA SESSION SUMMARY
Course Elapsed Days: 25
Plan Fractions Treated to Date: 18
Plan Prescribed Dose Per Fraction: 2 Gy
Plan Total Fractions Prescribed: 20
Plan Total Prescribed Dose: 40 Gy
Reference Point Dosage Given to Date: 36 Gy
Reference Point Session Dosage Given: 2 Gy
Session Number: 18

## 2022-11-11 MED ORDER — HYDROCODONE-ACETAMINOPHEN 5-325 MG PO TABS
1.0000 | ORAL_TABLET | Freq: Once | ORAL | Status: AC
  Administered 2022-11-11: 1 via ORAL
  Filled 2022-11-11: qty 1

## 2022-11-11 MED ORDER — IPRATROPIUM-ALBUTEROL 0.5-2.5 (3) MG/3ML IN SOLN: 3.0000 mL | Freq: Once | RESPIRATORY_TRACT | 0 refills | Status: DC

## 2022-11-11 MED ORDER — IPRATROPIUM-ALBUTEROL 0.5-2.5 (3) MG/3ML IN SOLN: 3.0000 mL | RESPIRATORY_TRACT | 0 refills | Status: DC | PRN

## 2022-11-11 MED FILL — Dexamethasone Sodium Phosphate Inj 100 MG/10ML: INTRAMUSCULAR | Qty: 1 | Status: AC

## 2022-11-11 MED FILL — Fosaprepitant Dimeglumine For IV Infusion 150 MG (Base Eq): INTRAVENOUS | Qty: 5 | Status: AC

## 2022-11-11 NOTE — Progress Notes (Signed)
1500-Contacted [employer]-pt's oxygen supplier comp. Arranged for nebulizer machine to be delivered to patient's home.

## 2022-11-11 NOTE — Progress Notes (Signed)
Symptom Management [place] at [facility] [phone] [phone]  Patient Care Team: Tracie Harrier, MD as PCP - General (Internal Medicine) Telford Nab, RN as Oncology Nurse Navigator   NAME OF PATIENT: Trevor Montgomery  [id number]  1961-07-10   DATE OF VISIT: 11/11/22  REASON FOR CONSULT: Trevor Montgomery is a 62 y.o. 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  INTERVAL HISTORY: Patient was started on palliative XRT to lung mass with plan to initiate cisplatin, etoposide, and Tecentriq chemotherapy.  Patient was seen today in radiation oncology for XRT treatment and sent to [facility] due to intractable pain.  Patient reports 10 out of 10 pain in the hips and lower back.  He is ambulatory with use of a cane but says the pain is worse with movement.  He was started on Norco but patient says he that he is not taking this regularly.  He has not had any pain medications today.  Patient does say that the Norco was effective at reducing the pain to a tolerable level but that he is concerned about possible addiction.  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  Additionally, patient says that he has been more short of breath today with some wheezing.  He was noted to be hypoxic with SpO2 in high 80s on 2 L O2.  He is currently satting mid 90s on 3 L O2.  He denies fever or chills.  Denies GI or GU symptoms.  No other specific complaints today.  PAST MEDICAL HISTORY: Past Medical History:  Diagnosis Date   Anxiety    Arthritis    Emphysema of lung ([place])    HTN (hypertension)    Hyperlipemia    Schizoaffective disorder, bipolar type ([place])    Substance abuse ([place])     PAST SURGICAL HISTORY:  Past Surgical History:  Procedure Laterality Date    FINGER SURGERY     IR IMAGING GUIDED PORT INSERTION  11/04/2022   VIDEO BRONCHOSCOPY WITH ENDOBRONCHIAL ULTRASOUND Left 10/11/2022   Procedure: VIDEO BRONCHOSCOPY WITH ENDOBRONCHIAL ULTRASOUND;  Surgeon: Armando Reichert, MD;  Location: [facility] ORS;  Service: Pulmonary;  Laterality: Left;    HEMATOLOGY/ONCOLOGY HISTORY:  Oncology History  Small cell lung cancer ([place])  10/29/2022 Initial Diagnosis   Small cell lung cancer ([place])   10/29/2022 Cancer Staging   Staging form: Lung, AJCC 8th Edition - Clinical: Stage IVA (cT4, cN2, cM1a) - Signed by Lloyd Huger, MD on 10/29/2022   11/12/2022 -  Chemotherapy   Patient is on Treatment Plan : LUNG SCLC Carboplatin + Etoposide + Atezolizumab Induction q21d x 4 cycles / Atezolizumab Maintenance q21d       ALLERGIES:  has No Known Allergies.  MEDICATIONS:  Current Outpatient Medications  Medication Sig Dispense Refill   albuterol (VENTOLIN HFA) 108 (90 Base) MCG/ACT inhaler Inhale 2 puffs into the lungs every 6 (six) hours as needed for wheezing or shortness of breath.     amLODipine (NORVASC) 10 MG tablet Take 1 tablet (10 mg total) by mouth daily. 30 tablet 0   benztropine (COGENTIN) 2 MG tablet Take 2 mg by mouth 2 (two) times daily.     bisacodyl (DULCOLAX) 5 MG EC tablet Take 1 tablet (5 mg total) by mouth daily as needed for moderate constipation. 30 tablet 0  dexamethasone (DECADRON) 4 MG tablet Take 1 tablet (4 mg total) by mouth daily. 30 tablet 1   docusate sodium (COLACE) 100 MG capsule Take 1 capsule (100 mg total) by mouth 2 (two) times daily as needed for mild constipation. 10 capsule 0   fluPHENAZine (PROLIXIN) 5 MG tablet Take 2.5 mg by mouth daily.     guaiFENesin (MUCINEX) 600 MG 12 hr tablet Take 1 tablet (600 mg total) by mouth 2 (two) times daily as needed for cough. 30 tablet 0   HYDROcodone-acetaminophen (NORCO/VICODIN) 5-325 MG tablet Take 1-2 tablets by mouth every 6 (six) hours as needed for moderate pain. 30 tablet 0    lidocaine-prilocaine (EMLA) cream Apply to affected area once 30 g 3   meloxicam (MOBIC) 15 MG tablet Take 15 mg by mouth daily.     Multiple Vitamin (MULTI-VITAMIN) tablet Take 1 tablet by mouth daily.     nicotine (NICODERM CQ - DOSED IN MG/24 HOURS) 21 mg/24hr patch Place 21 mg onto the skin daily.     ondansetron (ZOFRAN) 8 MG tablet Take 1 tablet (8 mg total) by mouth every 8 (eight) hours as needed for nausea, vomiting or refractory nausea / vomiting. Start on the third day after carboplatin. 60 tablet 2   prochlorperazine (COMPAZINE) 10 MG tablet Take 1 tablet (10 mg total) by mouth every 6 (six) hours as needed for nausea or vomiting. 60 tablet 2   propranolol (INDERAL) 10 MG tablet Take 0.5 tablets (5 mg total) by mouth 2 (two) times daily. 60 tablet 0   simvastatin (ZOCOR) 20 MG tablet Take 20 mg by mouth at bedtime.     Vitamin E 400 units TABS Take 400 Units by mouth daily.     No current facility-administered medications for this visit.    VITAL SIGNS: BP (!) 141/96   Pulse 93   Temp (!) 96.8 F (36 C) (Tympanic)   Resp (!) 24   SpO2 94%  There were no vitals filed for this visit.  Estimated body mass index is 25.98 kg/m as calculated from the following:   Height as of 10/29/22: 5\' 7"  (1.702 m).   Weight as of 10/29/22: 165 lb 14.4 oz (75.3 kg).  LABS: CBC:    Component Value Date/Time   WBC 12.9 (H) 11/07/2022 1243   HGB 13.9 11/07/2022 1243   HCT 42.5 11/07/2022 1243   PLT 233 11/07/2022 1243   MCV 83.2 11/07/2022 1243   Comprehensive Metabolic Panel:    Component Value Date/Time   NA 133 (L) 10/16/2022 0434   K 3.8 10/16/2022 0434   CL 96 (L) 10/16/2022 0434   CO2 32 10/16/2022 0434   BUN 11 10/16/2022 0434   CREATININE 0.50 (L) 10/16/2022 0434   GLUCOSE 94 10/16/2022 0434   CALCIUM 8.5 (L) 10/16/2022 0434   AST 28 10/10/2022 1730   ALT 23 10/10/2022 1730   ALKPHOS 56 10/10/2022 1730   BILITOT 0.8 10/10/2022 1730   PROT 6.8 10/10/2022 1730   ALBUMIN 3.9  10/10/2022 1730    RADIOGRAPHIC STUDIES: IR IMAGING GUIDED PORT INSERTION  Result Date: 11/04/2022 INDICATION: IV access for chemotherapy EXAM: Chest port placement using ultrasound and fluoroscopic guidance MEDICATIONS: Documented in the EMR ANESTHESIA/SEDATION: Moderate (conscious) sedation was employed during this procedure. A total of Versed 1.5 mg and Fentanyl 100 mcg was administered intravenously. Moderate Sedation Time: 22 minutes. The patient's level of consciousness and vital signs were monitored continuously by radiology nursing throughout the procedure under my direct  supervision. FLUOROSCOPY TIME:  Fluoroscopy Time: 0.4 minutes (1 mGy) COMPLICATIONS: None immediate. PROCEDURE: Informed written consent was obtained from the patient after a thorough discussion of the procedural risks, benefits and alternatives. All questions were addressed. Maximal Sterile Barrier Technique was utilized including caps, mask, sterile gowns, sterile gloves, sterile drape, hand hygiene and skin antiseptic. A timeout was performed prior to the initiation of the procedure. The patient was placed supine on the exam table. The right neck and chest was prepped and draped in the standard sterile fashion. A preliminary ultrasound of the right neck was performed and demonstrates a patent right internal jugular vein. A permanent ultrasound image was stored in the electronic medical record. The overlying skin was anesthetized with 1% Lidocaine. Using ultrasound guidance, access was obtained into the right internal jugular vein using a 21 gauge micropuncture set. A wire was advanced into the SVC, a short incision was made at the puncture site, and serial dilatation performed. Next, in an ipsilateral infraclavicular location, an incision was made at the site of the subcutaneous reservoir. Blunt dissection was used to open a pocket to contain the reservoir. A subcutaneous tunnel was then created from the port site to the puncture  site. A(n) 8 Fr single lumen catheter was advanced through the tunnel. The catheter was attached to the port and this was placed in the subcutaneous pocket. Under fluoroscopic guidance, a peel away sheath was placed, and the catheter was trimmed to the appropriate length and was advanced into the central veins. The catheter length is 25 cm. The tip of the catheter lies near the superior cavoatrial junction. The port flushes and aspirates appropriately. The port was flushed and locked with heparinized saline. The port pocket was closed in 2 layers using 3-0 and 4-0 Vicryl/absorbable suture. Dermabond was also applied to both incisions. The patient tolerated the procedure well and was transferred to recovery in stable condition. IMPRESSION: Successful placement of a right-sided chest port via the right internal jugular vein. The port is ready for immediate use. Electronically Signed   By: Albin Felling M.D.   On: 11/04/2022 16:25   NM PET Image Initial (PI) Skull Base To Thigh  Result Date: 11/04/2022 CLINICAL DATA:  Initial treatment strategy for small-cell lung cancer. EXAM: NUCLEAR MEDICINE PET SKULL BASE TO THIGH TECHNIQUE: 8.5 mCi F-18 FDG was injected intravenously. Full-ring PET imaging was performed from the skull base to thigh after the radiotracer. CT data was obtained and used for attenuation correction and anatomic localization. Fasting blood glucose: 100 mg/dl COMPARISON:  10/11/2022 abdominopelvic CT.  Chest CT 10/11/2022 FINDINGS: Mediastinal blood pool activity: SUV max 1.1 Liver activity: SUV max NA NECK: No areas of abnormal hypermetabolism. Incidental CT findings: No gross abnormality identified. Degradation secondary to patient marked kyphosis. CHEST: A high left mediastinal/prevascular node measures 5 mm and a S.U.V. max of 2.4 on 58/2. Hypermetabolism corresponding to the dominant left suprahilar/AP window mass on recent CT. Example at a S.U.V. max of 3.6 on 67/2. No pulmonary parenchymal  hypermetabolism identified. Incidental CT findings: Centrilobular emphysema. Right lower lobe scarring. Interstitial thickening within the left upper lobe is likely due to postobstructive pneumonitis. ABDOMEN/PELVIS: No abdominopelvic nodal hypermetabolism. Right adrenal nodule measures 1.3 cm, 37 HU, and a S.U.V. max of 3.2. New since 2010. Incidental CT findings: Low-density bilateral renal lesions of maximally 3.2 cm are likely cysts . In the absence of clinically indicated signs/symptoms require(s) no independent follow-up. Right-sided dependent bladder stones of up to 4 mm. Mild prostatomegaly.  SKELETON: Relatively diffuse osseous metastasis. Example L5 vertebral body at a S.U.V. max of 7.5. Posterior left iliac at a S.U.V. max of 7.7 on 175/2. Incidental CT findings: Remote right rib fractures. IMPRESSION: 1. Hypermetabolic thoracic nodes, including the dominant left suprahilar/AP window nodal mass. 2. Diffuse osseous metastasis. 3. Hypermetabolic right adrenal nodule, suspicious for metastatic disease. 4. Incidental findings, including: Prostatomegaly with bladder calculi. Emphysema (ICD10-J43.9). Electronically Signed   By: Abigail Miyamoto M.D.   On: 11/04/2022 16:17    PERFORMANCE STATUS (ECOG) : 2 - Symptomatic, <50% confined to bed  Review of Systems Unless otherwise noted, a complete review of systems is negative.  Physical Exam General: NAD Cardiovascular: regular rate and rhythm Pulmonary: Coarse anterior/posterior fields Abdomen: soft, nontender, + bowel sounds GU: no suprapubic tenderness Extremities: no edema, no joint deformities Skin: no rashes Neurological: Weakness but otherwise nonfocal  IMPRESSION/PLAN: Stage IV non-small cell lung cancer -prognosis poor.  Patient has extensive disease with plan to start chemotherapy tomorrow.  Unclear how he will tolerate treatment.  Neoplasm related pain -poorly controlled but patient is not taking his Norco as prescribed.  Had a long  conversation with patient and sister today regarding the importance of consistent dosing to ensure comfort.  Can consider starting him on a long-acting opioid but patient is technically opioid nave at this point.  Will try to maximize Norco first.  Continue dexamethasone.  Would recommend XRT to pelvis when able.    Shortness of breath -likely secondary to extensive tumor burden in addition to COPD.  Will obtain chest x-ray.  Continue dexamethasone.  Will start on DuoNebs as sister reports that nebulizers were helping while patient was hospitalized.  Continue O2 2 to 3 L.  ER triggers reviewed in detail.  Goals of care -patient's sister is his legal guardian given history of schizophrenia.  Recommended consideration of DNR/DNI given overall poor prognosis.  His sister said that they will talk about decision-making.  RTC tomorrow.  Case and plan discussed with [name]  Patient expressed understanding and was in agreement with this plan. He also understands that He can call clinic at any time with any questions, concerns, or complaints.   Thank you for allowing me to participate in the care of this very pleasant patient.   Time Total: 25 minutes  Visit consisted of counseling and education dealing with the complex and emotionally intense issues of symptom management in the setting of serious illness.Greater than 50%  of this time was spent counseling and coordinating care related to the above assessment and plan.  Signed by: Altha Harm, PhD, NP-C

## 2022-11-11 NOTE — Telephone Encounter (Signed)
Per Hassan Rowan- pt's sister- wanda called back to discuss pt's desired oxygen levels. Pt went home on 3L of oxygen. This was increased from 2-3 while pt was in clinic to keep oxygen sats above 92%. Radiation oncology reported that pt's sats were around 87% on 2L when pt arrived on home thank. Pt's oxygen was increased by rad oncology to 3L and improved to 96% while in [facility] clinic.   I returned phone call to pt's sister. Discussed goals of care with oxygen levels. She understands that the goal is to keep pt's oxygen levels above 90%. Pt may use 2-3 Liters to keep oxygen above 90% per v/o Josh. Pt's family instructed that if patient's symptoms worsen and patient starts deconditioning overnight, then pt needs to go to the ER. Family gave verbal understanding of the plan of care.  Pt/pt's family instructed to keep apts as scheduled tom.

## 2022-11-12 ENCOUNTER — Inpatient Hospital Stay: Payer: Medicare Other

## 2022-11-12 ENCOUNTER — Other Ambulatory Visit: Payer: Self-pay

## 2022-11-12 ENCOUNTER — Encounter: Payer: Self-pay | Admitting: *Deleted

## 2022-11-12 ENCOUNTER — Ambulatory Visit
Admission: RE | Admit: 2022-11-12 | Discharge: 2022-11-12 | Disposition: A | Discharge status: Home / Self Care | Payer: Medicare Other | Source: Ambulatory Visit | Attending: Radiation Oncology | Admitting: Radiation Oncology

## 2022-11-12 ENCOUNTER — Encounter: Payer: Medicare Other | Admitting: Hospice and Palliative Medicine

## 2022-11-12 ENCOUNTER — Observation Stay
Admission: EM | Admit: 2022-11-12 | Discharge: 2022-11-13 | Disposition: A | Discharge status: Home / Self Care | Payer: Medicare Other | Attending: Internal Medicine | Admitting: Internal Medicine

## 2022-11-12 ENCOUNTER — Ambulatory Visit: Payer: Medicare Other

## 2022-11-12 ENCOUNTER — Inpatient Hospital Stay: Payer: Medicare Other | Admitting: Hospice and Palliative Medicine

## 2022-11-12 ENCOUNTER — Encounter: Payer: Self-pay | Admitting: Licensed Clinical Social Worker

## 2022-11-12 ENCOUNTER — Encounter: Payer: Self-pay | Admitting: Oncology

## 2022-11-12 ENCOUNTER — Inpatient Hospital Stay (HOSPITAL_BASED_OUTPATIENT_CLINIC_OR_DEPARTMENT_OTHER): Payer: Medicare Other | Admitting: Oncology

## 2022-11-12 ENCOUNTER — Emergency Department: Payer: Medicare Other

## 2022-11-12 VITALS — BP 126/78 | HR 80 | Wt 169.3 lb

## 2022-11-12 VITALS — BP 126/83 | HR 88 | Temp 97.7°F | Resp 18

## 2022-11-12 DIAGNOSIS — Z87891 Personal history of nicotine dependence: Secondary | ICD-10-CM | POA: Insufficient documentation

## 2022-11-12 DIAGNOSIS — Z720 Tobacco use: Secondary | ICD-10-CM | POA: Diagnosis present

## 2022-11-12 DIAGNOSIS — Z510 Encounter for antineoplastic radiation therapy: Secondary | ICD-10-CM | POA: Diagnosis not present

## 2022-11-12 DIAGNOSIS — I10 Essential (primary) hypertension: Secondary | ICD-10-CM | POA: Insufficient documentation

## 2022-11-12 DIAGNOSIS — D696 Thrombocytopenia, unspecified: Secondary | ICD-10-CM | POA: Insufficient documentation

## 2022-11-12 DIAGNOSIS — E871 Hypo-osmolality and hyponatremia: Secondary | ICD-10-CM | POA: Diagnosis not present

## 2022-11-12 DIAGNOSIS — R55 Syncope and collapse: Secondary | ICD-10-CM | POA: Diagnosis not present

## 2022-11-12 DIAGNOSIS — J449 Chronic obstructive pulmonary disease, unspecified: Secondary | ICD-10-CM | POA: Diagnosis not present

## 2022-11-12 DIAGNOSIS — Z79899 Other long term (current) drug therapy: Secondary | ICD-10-CM | POA: Diagnosis not present

## 2022-11-12 DIAGNOSIS — J9610 Chronic respiratory failure, unspecified whether with hypoxia or hypercapnia: Secondary | ICD-10-CM | POA: Insufficient documentation

## 2022-11-12 DIAGNOSIS — J432 Centrilobular emphysema: Secondary | ICD-10-CM | POA: Diagnosis not present

## 2022-11-12 DIAGNOSIS — Z1152 Encounter for screening for COVID-19: Secondary | ICD-10-CM | POA: Insufficient documentation

## 2022-11-12 DIAGNOSIS — C3490 Malignant neoplasm of unspecified part of unspecified bronchus or lung: Secondary | ICD-10-CM

## 2022-11-12 DIAGNOSIS — Z5111 Encounter for antineoplastic chemotherapy: Secondary | ICD-10-CM | POA: Diagnosis not present

## 2022-11-12 DIAGNOSIS — E876 Hypokalemia: Secondary | ICD-10-CM | POA: Insufficient documentation

## 2022-11-12 DIAGNOSIS — R0602 Shortness of breath: Secondary | ICD-10-CM | POA: Insufficient documentation

## 2022-11-12 DIAGNOSIS — F209 Schizophrenia, unspecified: Secondary | ICD-10-CM | POA: Diagnosis not present

## 2022-11-12 LAB — COMPREHENSIVE METABOLIC PANEL
ALT: 21 U/L (ref 0–44)
ALT: 21 U/L (ref 0–44)
AST: 44 U/L — ABNORMAL HIGH (ref 15–41)
AST: 42 U/L — ABNORMAL HIGH (ref 15–41)
Albumin: 3.3 g/dL — ABNORMAL LOW (ref 3.5–5.0)
Albumin: 3 g/dL — ABNORMAL LOW (ref 3.5–5.0)
Alkaline Phosphatase: 80 U/L (ref 38–126)
Alkaline Phosphatase: 71 U/L (ref 38–126)
Anion gap: 13 (ref 5–15)
Anion gap: 9 (ref 5–15)
BUN: 17 mg/dL (ref 6–20)
BUN: 16 mg/dL (ref 6–20)
CO2: 27 mmol/L (ref 22–32)
CO2: 30 mmol/L (ref 22–32)
Calcium: 8.4 mg/dL — ABNORMAL LOW (ref 8.9–10.3)
Calcium: 8.2 mg/dL — ABNORMAL LOW (ref 8.9–10.3)
Chloride: 80 mmol/L — ABNORMAL LOW (ref 98–111)
Chloride: 82 mmol/L — ABNORMAL LOW (ref 98–111)
Creatinine, Ser: 0.57 mg/dL — ABNORMAL LOW (ref 0.61–1.24)
Creatinine, Ser: 0.4 mg/dL — ABNORMAL LOW (ref 0.61–1.24)
GFR, Estimated: >60 mL/min (ref >60)
GFR, Estimated: >60 mL/min (ref >60)
Glucose, Bld: 248 mg/dL — ABNORMAL HIGH (ref 70–99)
Glucose, Bld: 117 mg/dL — ABNORMAL HIGH (ref 70–99)
Potassium: 3.2 mmol/L — ABNORMAL LOW (ref 3.5–5.1)
Potassium: 3.8 mmol/L (ref 3.5–5.1)
Sodium: 121 mmol/L — ABNORMAL LOW (ref 135–145)
Sodium: 121 mmol/L — ABNORMAL LOW (ref 135–145)
Total Bilirubin: 0.6 mg/dL (ref 0.3–1.2)
Total Bilirubin: 0.6 mg/dL (ref 0.3–1.2)
Total Protein: 6.8 g/dL (ref 6.5–8.1)
Total Protein: 6.4 g/dL — ABNORMAL LOW (ref 6.5–8.1)

## 2022-11-12 LAB — CBC WITH DIFFERENTIAL/PLATELET
Abs Immature Granulocytes: 0.51 10*3/uL — ABNORMAL HIGH (ref 0.00–0.07)
Abs Immature Granulocytes: 0.67 10*3/uL — ABNORMAL HIGH (ref 0.00–0.07)
Basophils Absolute: 0 10*3/uL (ref 0.0–0.1)
Basophils Absolute: 0.1 10*3/uL (ref 0.0–0.1)
Basophils Relative: 0 %
Basophils Relative: 0 %
Eosinophils Absolute: 0 10*3/uL (ref 0.0–0.5)
Eosinophils Absolute: 0 10*3/uL (ref 0.0–0.5)
Eosinophils Relative: 0 %
Eosinophils Relative: 0 %
HCT: 37.9 % — ABNORMAL LOW (ref 39.0–52.0)
HCT: 35.7 % — ABNORMAL LOW (ref 39.0–52.0)
Hemoglobin: 13.1 g/dL (ref 13.0–17.0)
Hemoglobin: 12.4 g/dL — ABNORMAL LOW (ref 13.0–17.0)
Immature Granulocytes: 5 %
Immature Granulocytes: 6 %
Lymphocytes Relative: 5 %
Lymphocytes Relative: 3 %
Lymphs Abs: 0.5 10*3/uL — ABNORMAL LOW (ref 0.7–4.0)
Lymphs Abs: 0.3 10*3/uL — ABNORMAL LOW (ref 0.7–4.0)
MCH: 27.6 pg (ref 26.0–34.0)
MCH: 27.5 pg (ref 26.0–34.0)
MCHC: 34.6 g/dL (ref 30.0–36.0)
MCHC: 34.7 g/dL (ref 30.0–36.0)
MCV: 79.8 fL — ABNORMAL LOW (ref 80.0–100.0)
MCV: 79.2 fL — ABNORMAL LOW (ref 80.0–100.0)
Monocytes Absolute: 0.3 10*3/uL (ref 0.1–1.0)
Monocytes Absolute: 0.4 10*3/uL (ref 0.1–1.0)
Monocytes Relative: 3 %
Monocytes Relative: 4 %
Neutro Abs: 8.9 10*3/uL — ABNORMAL HIGH (ref 1.7–7.7)
Neutro Abs: 10.2 10*3/uL — ABNORMAL HIGH (ref 1.7–7.7)
Neutrophils Relative %: 87 %
Neutrophils Relative %: 87 %
Platelets: 164 10*3/uL (ref 150–400)
Platelets: 179 10*3/uL (ref 150–400)
RBC: 4.75 MIL/uL (ref 4.22–5.81)
RBC: 4.51 MIL/uL (ref 4.22–5.81)
RDW: 13.2 % (ref 11.5–15.5)
RDW: 13.4 % (ref 11.5–15.5)
Smear Review: NORMAL
WBC: 10.2 10*3/uL (ref 4.0–10.5)
WBC: 11.7 10*3/uL — ABNORMAL HIGH (ref 4.0–10.5)
nRBC: 0 % (ref 0.0–0.2)
nRBC: 0 % (ref 0.0–0.2)

## 2022-11-12 LAB — URINALYSIS, ROUTINE W REFLEX MICROSCOPIC
Bacteria, UA: NONE SEEN
Bilirubin Urine: NEGATIVE
Glucose, UA: NEGATIVE mg/dL
Ketones, ur: NEGATIVE mg/dL
Leukocytes,Ua: NEGATIVE
Nitrite: NEGATIVE
Protein, ur: NEGATIVE mg/dL
RBC / HPF: >50 RBC/hpf (ref 0–5)
Specific Gravity, Urine: 1.033 — ABNORMAL HIGH (ref 1.005–1.030)
Squamous Epithelial / HPF: NONE SEEN /HPF (ref 0–5)
pH: 7 (ref 5.0–8.0)

## 2022-11-12 LAB — RAD ONC ARIA SESSION SUMMARY
Course Elapsed Days: 26
Plan Fractions Treated to Date: 19
Plan Prescribed Dose Per Fraction: 2 Gy
Plan Total Fractions Prescribed: 20
Plan Total Prescribed Dose: 40 Gy
Reference Point Dosage Given to Date: 38 Gy
Reference Point Session Dosage Given: 2 Gy
Session Number: 19

## 2022-11-12 LAB — RESP PANEL BY RT-PCR (RSV, FLU A&B, COVID)  RVPGX2
Influenza A by PCR: NEGATIVE
Influenza B by PCR: NEGATIVE
Resp Syncytial Virus by PCR: NEGATIVE
SARS Coronavirus 2 by RT PCR: NEGATIVE

## 2022-11-12 LAB — TROPONIN I (HIGH SENSITIVITY)
Troponin I (High Sensitivity): 6 ng/L (ref <18)
Troponin I (High Sensitivity): 6 ng/L (ref <18)

## 2022-11-12 LAB — TSH: TSH: 1.096 u[IU]/mL (ref 0.350–4.500)

## 2022-11-12 MED ORDER — SODIUM CHLORIDE 0.9 % IV BOLUS: 500.0000 mL | Freq: Once | INTRAVENOUS | Status: DC

## 2022-11-12 MED ORDER — SODIUM CHLORIDE 0.9 % IV SOLN
100.0000 mg/m2 | Freq: Once | INTRAVENOUS | Status: AC
  Administered 2022-11-12: 200 mg via INTRAVENOUS
  Filled 2022-11-12: qty 10

## 2022-11-12 MED ORDER — SODIUM CHLORIDE 0.9 % IV SOLN
654.5000 mg | Freq: Once | INTRAVENOUS | Status: AC
  Administered 2022-11-12: 650 mg via INTRAVENOUS
  Filled 2022-11-12: qty 65

## 2022-11-12 MED ORDER — SODIUM CHLORIDE 0.9 % IV SOLN
1200.0000 mg | Freq: Once | INTRAVENOUS | Status: AC
  Administered 2022-11-12: 1200 mg via INTRAVENOUS
  Filled 2022-11-12: qty 20

## 2022-11-12 MED ORDER — SODIUM CHLORIDE 0.9 % IV BOLUS
500.0000 mL | Freq: Once | INTRAVENOUS | Status: AC
  Administered 2022-11-12: 500 mL via INTRAVENOUS

## 2022-11-12 MED ORDER — SODIUM CHLORIDE 0.9% FLUSH
10.0000 mL | Freq: Once | INTRAVENOUS | Status: AC
  Administered 2022-11-12: 10 mL via INTRAVENOUS
  Filled 2022-11-12: qty 10

## 2022-11-12 MED ORDER — SODIUM CHLORIDE 0.9 % IV SOLN
150.0000 mg | Freq: Once | INTRAVENOUS | Status: AC
  Administered 2022-11-12: 150 mg via INTRAVENOUS
  Filled 2022-11-12: qty 150

## 2022-11-12 MED ORDER — SODIUM CHLORIDE 0.9 % IV SOLN
10.0000 mg | Freq: Once | INTRAVENOUS | Status: AC
  Administered 2022-11-12: 10 mg via INTRAVENOUS
  Filled 2022-11-12: qty 10

## 2022-11-12 MED ORDER — IOHEXOL 350 MG/ML SOLN
100.0000 mL | Freq: Once | INTRAVENOUS | Status: AC | PRN
  Administered 2022-11-12: 100 mL via INTRAVENOUS

## 2022-11-12 MED ORDER — SODIUM CHLORIDE 0.9 % IV SOLN
Freq: Once | INTRAVENOUS | Status: AC
  Filled 2022-11-12: qty 250

## 2022-11-12 MED ORDER — PHENOL 1.4 % MT LIQD
1.0000 | OROMUCOSAL | Status: DC | PRN
  Filled 2022-11-12: qty 177

## 2022-11-12 MED ORDER — HEPARIN SOD (PORK) LOCK FLUSH 100 UNIT/ML IV SOLN
500.0000 [IU] | Freq: Once | INTRAVENOUS | Status: AC | PRN
  Administered 2022-11-12: 500 [IU]
  Filled 2022-11-12: qty 5

## 2022-11-12 MED ORDER — SODIUM CHLORIDE 1 G PO TABS: 1.0000 g | ORAL_TABLET | Freq: Three times a day (TID) | ORAL | 2 refills | Status: DC

## 2022-11-12 MED ORDER — PALONOSETRON HCL INJECTION 0.25 MG/5ML
0.2500 mg | Freq: Once | INTRAVENOUS | Status: AC
  Administered 2022-11-12: 0.25 mg via INTRAVENOUS
  Filled 2022-11-12: qty 5

## 2022-11-12 MED FILL — Dexamethasone Sodium Phosphate Inj 100 MG/10ML: INTRAMUSCULAR | Qty: 1 | Status: AC

## 2022-11-12 NOTE — H&P (Signed)
History and Physical    Patient: Trevor Montgomery DUK:[id number] DOB: June 05, 1961 DOA: 11/12/2022 DOS: the patient was seen and examined on 11/12/2022 PCP: Tracie Harrier, MD  Patient coming from: Home  Chief Complaint: No chief complaint on file.  HPI: Trevor Montgomery is a 62 y.o. 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  Patient started his chemotherapy today.  Also radiation therapy.  He was found to have sodium of 121.  Critical care consulted and they believe this was not new so patient does not require hypertonic saline.  Patient is being admitted to the hospital for workup of syncope.  Review of Systems: As mentioned in the history of present illness. All other systems reviewed and are negative. Past Medical History:  Diagnosis Date   Anxiety    Arthritis    Emphysema of lung ([place])    HTN (hypertension)    Hyperlipemia    Schizoaffective disorder, bipolar type ([place])    Substance abuse ([place])    Past Surgical History:  Procedure Laterality Date   FINGER SURGERY     IR IMAGING GUIDED PORT INSERTION  11/04/2022   VIDEO BRONCHOSCOPY WITH ENDOBRONCHIAL ULTRASOUND Left 10/11/2022   Procedure: VIDEO BRONCHOSCOPY WITH ENDOBRONCHIAL ULTRASOUND;  Surgeon: Armando Reichert, MD;  Location: [facility] ORS;  Service: Pulmonary;  Laterality: Left;   Social History:  reports that he quit smoking about 4 weeks ago. His smoking use included cigarettes. He has a 20.00 pack-year smoking history. He quit smokeless tobacco use about 4 weeks ago. He reports that he does not drink alcohol and does not use drugs.  No Known Allergies  Family History  Problem Relation Age of Onset   Stroke Father    Seizures Father     Stroke Sister    Cancer Maternal Grandmother     Prior to Admission medications   Medication Sig Start Date End Date Taking? Authorizing Provider  albuterol (VENTOLIN HFA) 108 (90 Base) MCG/ACT inhaler Inhale 2 puffs into the lungs every 6 (six) hours as needed for wheezing or shortness of breath. 09/13/22   [provider]  amLODipine (NORVASC) 10 MG tablet Take 1 tablet (10 mg total) by mouth daily. 10/16/22   Emeterio Reeve, DO  benztropine (COGENTIN) 2 MG tablet Take 2 mg by mouth 2 (two) times daily.    [provider]  bisacodyl (DULCOLAX) 5 MG EC tablet Take 1 tablet (5 mg total) by mouth daily as needed for moderate constipation. 10/16/22   Emeterio Reeve, DO  dexamethasone (DECADRON) 4 MG tablet Take 1 tablet (4 mg total) by mouth daily. 11/06/22   Lloyd Huger, MD  docusate sodium (COLACE) 100 MG capsule Take 1 capsule (100 mg total) by mouth 2 (two) times daily as needed for mild constipation. 10/16/22   Emeterio Reeve, DO  fluPHENAZine (PROLIXIN) 5 MG tablet Take 2.5 mg by mouth daily.    [provider]  guaiFENesin (MUCINEX) 600 MG 12 hr tablet Take 1 tablet (600 mg total) by mouth 2 (two) times daily as needed for cough. 10/16/22   Emeterio Reeve, DO  HYDROcodone-acetaminophen (NORCO/VICODIN) 5-325 MG tablet Take 1-2 tablets by mouth every 6 (six) hours as needed for moderate pain. 11/06/22   Lloyd Huger, MD  ipratropium-albuterol (DUONEB) 0.5-2.5 (3)  MG/3ML SOLN Take 3 mLs by nebulization every 4 (four) hours as needed. 11/11/22   Borders, Kirt Boys, NP  ipratropium-albuterol (DUONEB) 0.5-2.5 (3) MG/3ML SOLN Take 3 mLs by nebulization once for 1 dose. 11/11/22 11/11/22  Borders, Kirt Boys, NP  lidocaine-prilocaine (EMLA) cream Apply to affected area once 10/29/22   Lloyd Huger, MD  meloxicam (MOBIC) 15 MG tablet Take 15 mg by mouth daily. 05/08/22 05/08/23  [provider]  Multiple Vitamin (MULTI-VITAMIN) tablet Take 1  tablet by mouth daily.    [provider]  nicotine (NICODERM CQ - DOSED IN MG/24 HOURS) 21 mg/24hr patch Place 21 mg onto the skin daily. Patient not taking: Reported on 11/11/2022 05/02/22   [provider]  ondansetron (ZOFRAN) 8 MG tablet Take 1 tablet (8 mg total) by mouth every 8 (eight) hours as needed for nausea, vomiting or refractory nausea / vomiting. Start on the third day after carboplatin. Patient not taking: Reported on 11/11/2022 10/29/22   Lloyd Huger, MD  prochlorperazine (COMPAZINE) 10 MG tablet Take 1 tablet (10 mg total) by mouth every 6 (six) hours as needed for nausea or vomiting. Patient not taking: Reported on 11/11/2022 10/29/22   Lloyd Huger, MD  propranolol (INDERAL) 10 MG tablet Take 0.5 tablets (5 mg total) by mouth 2 (two) times daily. 10/16/22   Emeterio Reeve, DO  simvastatin (ZOCOR) 20 MG tablet Take 20 mg by mouth at bedtime. 05/02/22   [provider]  sodium chloride 1 g tablet Take 1 tablet (1 g total) by mouth 3 (three) times daily. 11/12/22   Lloyd Huger, MD  Vitamin E 400 units TABS Take 400 Units by mouth daily.    [provider]    Physical Exam: Vitals:   11/12/22 2200 11/12/22 2215 11/12/22 2230 11/12/22 2245  BP:      Pulse:      Resp: 18 15 13 16   Temp:      SpO2:       Constitutional: Chronically ill looking, slightly confused NAD, calm, comfortable Eyes: PERRL, lids and conjunctivae normal ENMT: Mucous membranes are dry. Posterior pharynx clear of any exudate or lesions.Normal dentition.  Neck: normal, supple, no masses, no thyromegaly Respiratory: clear to auscultation bilaterally, no wheezing, no crackles. Normal respiratory effort. No accessory muscle use.  Cardiovascular: Regular rate and rhythm, no murmurs / rubs / gallops. No extremity edema. 2+ pedal pulses. No carotid bruits.  Abdomen: no tenderness, no masses palpated. No hepatosplenomegaly. Bowel sounds positive.   Musculoskeletal: Good range of motion, no joint swelling or tenderness, Skin: no rashes, lesions, ulcers. No induration Neurologic: CN 2-12 grossly intact. Sensation intact, DTR normal. Strength 5/5 in all 4.  Psychiatric: Normal judgment and insight. Alert and oriented x 3.  Anxious mood  Data Reviewed:  Sodium is 121, potassium 3.8, chloride 82, glucose 117, albumin 3.0, white count 11.7 hemoglobin 12.4 and platelets 179.  Urinalysis essentially negative.  Chest x-ray showed decreased prominence of the soft tissue mass.  CT angiogram of the chest showed no PE.  It shows emphysema with interval decrease in previously noted left hilar mass and loss compared to prior CT.  There is a 10 mm high prevascular lymph node previously was 5 mm.  Head CT without contrast is negative.  Assessment and Plan:  #1 syncope: Suspected due to dehydration or reaction to chemotherapy and radiation the patient had today.  Patient will be admitted for observation.  Hydrate patient.  Monitor on telemetry.  Does not appear to be seizure.  #2 hyponatremia: Sodium around 121.  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  Already evaluated by CCM.  Does not appear to be the cause of his passing out as he has chronic hyponatremia.  Free water restriction.  Gentle hydration with saline.  #3 stage IV lung cancer: Followed by [name].  He is on palliative chemotherapy.  Also radiation therapy.  We will inform oncology in the morning  #4 essential hypertension: Resume home regimen.  #5 schizophrenia: Stable  #6 tobacco abuse: Counseling provided.  #7 COPD: No acute exacerbation.     Advance Care Planning:   Code Status: Prior full code  Consults: None but will need oncology consult in the morning  Family Communication: Sister at bedside  Severity of Illness: The appropriate patient status for this patient is OBSERVATION. Observation status is judged to be reasonable and necessary in order to provide the  required intensity of service to ensure the patient's safety. The patient's presenting symptoms, physical exam findings, and initial radiographic and laboratory data in the context of their medical condition is felt to place them at decreased risk for further clinical deterioration. Furthermore, it is anticipated that the patient will be medically stable for discharge from the hospital within 2 midnights of admission.   AuthorBarbette Merino, MD 11/12/2022 11:00 PM  For on call review [email or web address].

## 2022-11-12 NOTE — Progress Notes (Signed)
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  INTERVAL HISTORY: Patient returns to clinic today for further evaluation and initiation of cycle 1, day 1 of cisplatin and etoposide.  He continues to have left hip pain, but states this is improved since taking his narcotics as prescribed.  He otherwise feels well.  He is tolerating XRT without significant side effects.  He continues to have chronic shortness of breath and cough.  He has no neurologic complaints.  He denies any recent fevers.  He has a good appetite and denies weight loss.  He has no chest pain or hemoptysis.  He has no nausea, vomiting, constipation, or diarrhea.  He has no urinary complaints.  Patient offers no further complaints today.  REVIEW OF SYSTEMS:   Review of Systems  Constitutional: Negative.  Negative for fever, malaise/fatigue and weight loss.  Respiratory:  Positive for cough and shortness of breath. Negative for hemoptysis.   Cardiovascular: Negative.  Negative for chest pain and leg swelling.  Gastrointestinal: Negative.  Negative for abdominal pain and nausea.  Genitourinary: Negative.  Negative for dysuria.  Musculoskeletal:  Positive for joint pain. Negative for back pain.  Skin: Negative.  Negative for rash.  Neurological: Negative.  Negative for dizziness, focal weakness, weakness and headaches.  Psychiatric/Behavioral: Negative.  The patient is not nervous/anxious.     As per HPI. Otherwise, a complete review of systems is negative.  PAST MEDICAL HISTORY: Past Medical History:  Diagnosis Date   Anxiety    Arthritis    Emphysema of lung ([place])    HTN (hypertension)    Hyperlipemia    Schizoaffective disorder, bipolar  type ([place])    Substance abuse ([place])     PAST SURGICAL HISTORY: Past Surgical History:  Procedure Laterality Date   FINGER SURGERY     IR IMAGING GUIDED PORT INSERTION  11/04/2022   VIDEO BRONCHOSCOPY WITH ENDOBRONCHIAL ULTRASOUND Left 10/11/2022   Procedure: VIDEO BRONCHOSCOPY WITH ENDOBRONCHIAL ULTRASOUND;  Surgeon: Armando Reichert, MD;  Location: [facility] ORS;  Service: Pulmonary;  Laterality: Left;    FAMILY HISTORY: Family History  Problem Relation Age of Onset   Stroke Father    Seizures Father    Stroke Sister    Cancer Maternal Grandmother     ADVANCED DIRECTIVES (Y/N):  N  HEALTH MAINTENANCE: Social History   Tobacco Use   Smoking status: Former    Packs/day: 0.50    Years: 40.00    Total pack years: 20.00    Types: Cigarettes    Quit date: 10/15/2022    Years since quitting: 0.0   Smokeless tobacco: Former    Quit date: 10/15/2022  Substance Use Topics   Alcohol use: No   Drug use: No     Colonoscopy:  PAP:  Bone density:  Lipid panel:  No Known Allergies  Current Outpatient Medications  Medication Sig Dispense Refill   albuterol (VENTOLIN HFA) 108 (90 Base) MCG/ACT inhaler Inhale 2 puffs into the lungs every 6 (six) hours as needed for wheezing or shortness of breath.     amLODipine (NORVASC) 10 MG tablet Take 1 tablet (10 mg total) by mouth daily. 30 tablet 0   benztropine (COGENTIN) 2 MG tablet Take 2 mg by mouth 2 (two) times  daily.     bisacodyl (DULCOLAX) 5 MG EC tablet Take 1 tablet (5 mg total) by mouth daily as needed for moderate constipation. 30 tablet 0   dexamethasone (DECADRON) 4 MG tablet Take 1 tablet (4 mg total) by mouth daily. 30 tablet 1   docusate sodium (COLACE) 100 MG capsule Take 1 capsule (100 mg total) by mouth 2 (two) times daily as needed for mild constipation. 10 capsule 0   fluPHENAZine (PROLIXIN) 5 MG tablet Take 2.5 mg by mouth daily.     guaiFENesin (MUCINEX) 600 MG 12 hr tablet Take 1 tablet (600 mg total) by mouth 2 (two)  times daily as needed for cough. 30 tablet 0   HYDROcodone-acetaminophen (NORCO/VICODIN) 5-325 MG tablet Take 1-2 tablets by mouth every 6 (six) hours as needed for moderate pain. 30 tablet 0   ipratropium-albuterol (DUONEB) 0.5-2.5 (3) MG/3ML SOLN Take 3 mLs by nebulization every 4 (four) hours as needed. 360 mL 0   lidocaine-prilocaine (EMLA) cream Apply to affected area once 30 g 3   meloxicam (MOBIC) 15 MG tablet Take 15 mg by mouth daily.     Multiple Vitamin (MULTI-VITAMIN) tablet Take 1 tablet by mouth daily.     propranolol (INDERAL) 10 MG tablet Take 0.5 tablets (5 mg total) by mouth 2 (two) times daily. 60 tablet 0   simvastatin (ZOCOR) 20 MG tablet Take 20 mg by mouth at bedtime.     sodium chloride 1 g tablet Take 1 tablet (1 g total) by mouth 3 (three) times daily. 30 tablet 2   Vitamin E 400 units TABS Take 400 Units by mouth daily.     ipratropium-albuterol (DUONEB) 0.5-2.5 (3) MG/3ML SOLN Take 3 mLs by nebulization once for 1 dose. 3 mL 0   nicotine (NICODERM CQ - DOSED IN MG/24 HOURS) 21 mg/24hr patch Place 21 mg onto the skin daily. (Patient not taking: Reported on 11/11/2022)     ondansetron (ZOFRAN) 8 MG tablet Take 1 tablet (8 mg total) by mouth every 8 (eight) hours as needed for nausea, vomiting or refractory nausea / vomiting. Start on the third day after carboplatin. (Patient not taking: Reported on 11/11/2022) 60 tablet 2   prochlorperazine (COMPAZINE) 10 MG tablet Take 1 tablet (10 mg total) by mouth every 6 (six) hours as needed for nausea or vomiting. (Patient not taking: Reported on 11/11/2022) 60 tablet 2   No current facility-administered medications for this visit.   Facility-Administered Medications Ordered in Other Visits  Medication Dose Route Frequency Provider Last Rate Last Admin   atezolizumab (TECENTRIQ) 1,200 mg in sodium chloride 0.9 % 250 mL chemo infusion  1,200 mg Intravenous Once Lloyd Huger, MD       CARBOplatin (PARAPLATIN) 650 mg in sodium  chloride 0.9 % 250 mL chemo infusion  650 mg Intravenous Once Lloyd Huger, MD       etoposide (VEPESID) 200 mg in sodium chloride 0.9 % 500 mL chemo infusion  100 mg/m2 (Treatment Plan Recorded) Intravenous Once Lloyd Huger, MD        OBJECTIVE: Vitals:   11/12/22 1044  BP: 126/83  Pulse: 88  Resp: 18  Temp: 97.7 F (36.5 C)  SpO2: 97%     There is no height or weight on file to calculate BMI.    ECOG FS:1 - Symptomatic but completely ambulatory  General: Well-developed, well-nourished, no acute distress. Eyes: Pink conjunctiva, anicteric sclera. HEENT: Normocephalic, moist mucous membranes. Lungs: No audible wheezing or coughing. Heart:  Regular rate and rhythm. Abdomen: Soft, nontender, no obvious distention. Musculoskeletal: No edema, cyanosis, or clubbing. Neuro: Alert, answering all questions appropriately. Cranial nerves grossly intact. Skin: No rashes or petechiae noted. Psych: Normal affect.  LAB RESULTS:  Lab Results  Component Value Date   NA 121 (L) 11/12/2022   K 3.2 (L) 11/12/2022   CL 80 (L) 11/12/2022   CO2 27 11/12/2022   GLUCOSE 248 (H) 11/12/2022   BUN 17 11/12/2022   CREATININE 0.57 (L) 11/12/2022   CALCIUM 8.4 (L) 11/12/2022   PROT 6.8 11/12/2022   ALBUMIN 3.3 (L) 11/12/2022   AST 44 (H) 11/12/2022   ALT 21 11/12/2022   ALKPHOS 80 11/12/2022   BILITOT 0.6 11/12/2022   GFRNONAA >60 11/12/2022    Lab Results  Component Value Date   WBC 10.2 11/12/2022   NEUTROABS 8.9 (H) 11/12/2022   HGB 13.1 11/12/2022   HCT 37.9 (L) 11/12/2022   MCV 79.8 (L) 11/12/2022   PLT 164 11/12/2022     STUDIES: DG Chest 2 View  Result Date: 11/11/2022 CLINICAL DATA:  Small cell lung carcinoma, shortness of breath, cough and wheezing. EXAM: CHEST - 2 VIEW COMPARISON:  10/11/2022 FINDINGS: Interval right jugular Port-A-Cath placement with the catheter tip in the lower SVC. The heart size and mediastinal contours are within normal limits. Decreased  prominence soft tissue in the left hilar region likely consistent with response to treatment of known small cell lung carcinoma centered in the left superior hilar region and extending throughout the hilum and into the mediastinum by prior imaging. No pulmonary edema, pleural fluid or pneumothorax. The visualized skeletal structures are unremarkable. IMPRESSION: Decreased prominence of soft tissue in the left hilar region likely consistent with response to treatment of known small cell lung carcinoma centered in the left superior hilar region and extending into the mediastinum. Electronically Signed   By: Aletta Edouard M.D.   On: 11/11/2022 15:54   IR IMAGING GUIDED PORT INSERTION  Result Date: 11/04/2022 INDICATION: IV access for chemotherapy EXAM: Chest port placement using ultrasound and fluoroscopic guidance MEDICATIONS: Documented in the EMR ANESTHESIA/SEDATION: Moderate (conscious) sedation was employed during this procedure. A total of Versed 1.5 mg and Fentanyl 100 mcg was administered intravenously. Moderate Sedation Time: 22 minutes. The patient's level of consciousness and vital signs were monitored continuously by radiology nursing throughout the procedure under my direct supervision. FLUOROSCOPY TIME:  Fluoroscopy Time: 0.4 minutes (1 mGy) COMPLICATIONS: None immediate. PROCEDURE: Informed written consent was obtained from the patient after a thorough discussion of the procedural risks, benefits and alternatives. All questions were addressed. Maximal Sterile Barrier Technique was utilized including caps, mask, sterile gowns, sterile gloves, sterile drape, hand hygiene and skin antiseptic. A timeout was performed prior to the initiation of the procedure. The patient was placed supine on the exam table. The right neck and chest was prepped and draped in the standard sterile fashion. A preliminary ultrasound of the right neck was performed and demonstrates a patent right internal jugular vein. A  permanent ultrasound image was stored in the electronic medical record. The overlying skin was anesthetized with 1% Lidocaine. Using ultrasound guidance, access was obtained into the right internal jugular vein using a 21 gauge micropuncture set. A wire was advanced into the SVC, a short incision was made at the puncture site, and serial dilatation performed. Next, in an ipsilateral infraclavicular location, an incision was made at the site of the subcutaneous reservoir. Blunt dissection was used to open a pocket to  contain the reservoir. A subcutaneous tunnel was then created from the port site to the puncture site. A(n) 8 Fr single lumen catheter was advanced through the tunnel. The catheter was attached to the port and this was placed in the subcutaneous pocket. Under fluoroscopic guidance, a peel away sheath was placed, and the catheter was trimmed to the appropriate length and was advanced into the central veins. The catheter length is 25 cm. The tip of the catheter lies near the superior cavoatrial junction. The port flushes and aspirates appropriately. The port was flushed and locked with heparinized saline. The port pocket was closed in 2 layers using 3-0 and 4-0 Vicryl/absorbable suture. Dermabond was also applied to both incisions. The patient tolerated the procedure well and was transferred to recovery in stable condition. IMPRESSION: Successful placement of a right-sided chest port via the right internal jugular vein. The port is ready for immediate use. Electronically Signed   By: Albin Felling M.D.   On: 11/04/2022 16:25   NM PET Image Initial (PI) Skull Base To Thigh  Result Date: 11/04/2022 CLINICAL DATA:  Initial treatment strategy for small-cell lung cancer. EXAM: NUCLEAR MEDICINE PET SKULL BASE TO THIGH TECHNIQUE: 8.5 mCi F-18 FDG was injected intravenously. Full-ring PET imaging was performed from the skull base to thigh after the radiotracer. CT data was obtained and used for attenuation  correction and anatomic localization. Fasting blood glucose: 100 mg/dl COMPARISON:  10/11/2022 abdominopelvic CT.  Chest CT 10/11/2022 FINDINGS: Mediastinal blood pool activity: SUV max 1.1 Liver activity: SUV max NA NECK: No areas of abnormal hypermetabolism. Incidental CT findings: No gross abnormality identified. Degradation secondary to patient marked kyphosis. CHEST: A high left mediastinal/prevascular node measures 5 mm and a S.U.V. max of 2.4 on 58/2. Hypermetabolism corresponding to the dominant left suprahilar/AP window mass on recent CT. Example at a S.U.V. max of 3.6 on 67/2. No pulmonary parenchymal hypermetabolism identified. Incidental CT findings: Centrilobular emphysema. Right lower lobe scarring. Interstitial thickening within the left upper lobe is likely due to postobstructive pneumonitis. ABDOMEN/PELVIS: No abdominopelvic nodal hypermetabolism. Right adrenal nodule measures 1.3 cm, 37 HU, and a S.U.V. max of 3.2. New since 2010. Incidental CT findings: Low-density bilateral renal lesions of maximally 3.2 cm are likely cysts . In the absence of clinically indicated signs/symptoms require(s) no independent follow-up. Right-sided dependent bladder stones of up to 4 mm. Mild prostatomegaly. SKELETON: Relatively diffuse osseous metastasis. Example L5 vertebral body at a S.U.V. max of 7.5. Posterior left iliac at a S.U.V. max of 7.7 on 175/2. Incidental CT findings: Remote right rib fractures. IMPRESSION: 1. Hypermetabolic thoracic nodes, including the dominant left suprahilar/AP window nodal mass. 2. Diffuse osseous metastasis. 3. Hypermetabolic right adrenal nodule, suspicious for metastatic disease. 4. Incidental findings, including: Prostatomegaly with bladder calculi. Emphysema (ICD10-J43.9). Electronically Signed   By: Abigail Miyamoto M.D.   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  Biopsy confirmed small cell lung carcinoma.  Patient could not tolerate MRI, but CT of the brain on October 11, 2022 is negative.  Patient is currently undergoing palliative XRT given his symptoms and encasement of pulmonary vessels.  PET scan results from November 04, 2022 reviewed independently and report as above with widespread bony metastasis.  Proceed with cycle 1, day 1 of cisplatin, etoposide, and Tecentriq.  Return to clinic in 1 and 2 days for etoposide only and then in 3 days for Udenyca.  Return to clinic in 1 week for laboratory work and further evaluation and then in 3 weeks for further evaluation and consideration of cycle 2, day 1.  Patient will receive 4 cycles of treatment and then will reimage.  Cycle 5 and beyond will be maintenance Tecentriq.  Shortness of breath.  Continue supplemental oxygen as needed.  Bone lesions: Patient will receive Zometa tomorrow and then approximately every 6 weeks on the odd-numbered cycles. Hyponatremia: Patient was given prescription for salt tablets today.  Current sodium is 121. Hypokalemia: Continue oral potassium supplementation. Pain: Patient has been instructed to take his hydrocodone as prescribed.  Appreciate palliative care input.   Patient expressed understanding and was in agreement with this plan. He also understands that He can call clinic at any time with any questions, concerns, or complaints.    Cancer Staging  Small cell lung cancer ([place]) Staging form: Lung, AJCC 8th Edition - Clinical: Stage IVA (cT4, cN2, cM1a) - Signed by Lloyd Huger, MD on 10/29/2022   Lloyd Huger, MD   11/12/2022 11:58 AM

## 2022-11-12 NOTE — ED Notes (Signed)
Patient transported to CT 

## 2022-11-12 NOTE — ED Notes (Signed)
UA when able

## 2022-11-12 NOTE — Patient Instructions (Signed)
[facility]  Discharge Instructions: Thank you for choosing [facility] to provide your oncology and hematology care.  If you have a lab appointment with the [facility], please go directly to the [facility] and check in at the registration area.  Wear comfortable clothing and clothing appropriate for easy access to any Portacath or PICC line.   We strive to give you quality time with your provider. You may need to reschedule your appointment if you arrive late (15 or more minutes).  Arriving late affects you and other patients whose appointments are after yours.  Also, if you miss three or more appointments without notifying the office, you may be dismissed from the clinic at the provider's discretion.      For prescription refill requests, have your pharmacy contact our office and allow 72 hours for refills to be completed.    To help prevent nausea and vomiting after your treatment, we encourage you to take your nausea medication as directed.  BELOW ARE SYMPTOMS THAT SHOULD BE REPORTED IMMEDIATELY: *FEVER GREATER THAN 100.4 F (38 C) OR HIGHER *CHILLS OR SWEATING *NAUSEA AND VOMITING THAT IS NOT CONTROLLED WITH YOUR NAUSEA MEDICATION *UNUSUAL SHORTNESS OF BREATH *UNUSUAL BRUISING OR BLEEDING *URINARY PROBLEMS (pain or burning when urinating, or frequent urination) *BOWEL PROBLEMS (unusual diarrhea, constipation, pain near the anus) TENDERNESS IN MOUTH AND THROAT WITH OR WITHOUT PRESENCE OF ULCERS (sore throat, sores in mouth, or a toothache) UNUSUAL RASH, SWELLING OR PAIN  UNUSUAL VAGINAL DISCHARGE OR ITCHING   Items with * indicate a potential emergency and should be followed up as soon as possible or go to the Emergency Department if any problems should occur.  Please show the CHEMOTHERAPY ALERT CARD or IMMUNOTHERAPY ALERT CARD at check-in to the Emergency Department and triage nurse.  Should you have questions after your visit or  need to cancel or reschedule your appointment, please contact [facility]  [phone] and follow the prompts.  Office hours are 8:00 a.m. to 4:30 p.m. Monday - Friday. Please note that voicemails left after 4:00 p.m. may not be returned until the following business day.  We are closed weekends and major holidays. You have access to a nurse at all times for urgent questions. Please call the main number to the clinic [phone] and follow the prompts.  For any non-urgent questions, you may also contact your provider using MyChart. We now offer e-Visits for anyone 66 and older to request care online for non-urgent symptoms. For details visit [email or web address].   Also download the MyChart app! Go to the app store, search "MyChart", open the app, select [facility], and log in with your MyChart username and password.

## 2022-11-12 NOTE — Progress Notes (Signed)
Asked by EDP to evaluate patient for ICU admission.  Patient presented with main complaint of syncope. Upon arrival to ED patient at his baseline mental status. Description by family and patient reports he became unresponsive with a pulse after taking a "pain pill". Of note the patient had a chemotherapy session earlier today and worked with OT at his house. There were concerns with EMS as to whether or not this was seizure activity. In discussion with family the patient had no tremors/ deviated gaze/ incontinence/ aura. He was lethargic after awakening but is now at his baseline.   ICU was asked to evaluate due to hyponatremia. Sodium is 121, of note the patient's sodium was 121 at the time of admission in January 2024. Family and patient denied any similar syncopal/seizure like events previously. 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  Upon bedside assessment, patient is A&O- non-toxic appearing. Vitals are stable, not on vasopressor support. I am suspicious this event was syncopal. Patient has not had any concerning neurological symptoms while in ED. I also think conservative IVF management, as successful previously, should be sufficient treatment in this situation.  Patient does not meet ICU admission criteria at this time. Please re-consult if needed in the future.       Domingo Pulse [place], AGACNP-BC Acute Care Nurse Practitioner [facility]    [phone] / [phone] Please see Amion for pager details.

## 2022-11-12 NOTE — Progress Notes (Signed)
[facility] Work  Clinical Social Work was referred by medical provider for assessment of psychosocial needs.  Clinical Social Worker attempted to contact patient by phone  to offer support and assess for needs.  CSW left voicemail for patient's sister and main contact Lorenso Courier [phone], with contact information and request for return call.    FA   Adelene Amas, LCSW  Clinical Social Worker [facility]

## 2022-11-12 NOTE — ED Provider Notes (Signed)
[facility] Provider Note    Event Date/Time   First MD Initiated Contact with Patient 11/12/22 1847     (approximate)   History   Syncope  HPI  Trevor Montgomery is a 62 y.o. male with small cell lung cancer stage IV who is status post day 1 cycle 1 of cisplatin and etoposide who comes in today for syncopal episode.  Patient was reportedly at home where he had an episode where his eyes rolled back into his head and he was unable to be woken up they thought he could be having a seizure he had fainted.  Unclear exactly how long this lasted for.  There is no seizure-like activity no known postictal period he was sitting down when it happened did not fall did not hit his head.  He has had some increased shortness of breath recently requiring 3 L of oxygen instead of his baseline 2 L.  They report that he is at his normal baseline mental status.  Denies any abdominal pain or any other concerns they thought that his lips were turning blue.   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 rate 18, SpO2 92 %.  Most recent vital signs: Vitals:   11/12/22 1853  BP: (!) 141/92  Pulse: 100  Resp: 18  Temp: 98 F (36.7 C)  SpO2: 92%     General: Awake, no distress.  CV:  Good peripheral perfusion.  Resp:  Normal effort.  Abd:  No distention.  Other:  Moving all extremities well.  Abdomen soft and nontender on his baseline 3 L of oxygen   ED Results / Procedures / Treatments   Labs (all labs ordered are listed, but only abnormal results are displayed) Labs Reviewed  CBC WITH DIFFERENTIAL/PLATELET - Abnormal;  Notable for the following components:      Result Value   WBC 11.7 (*)    Hemoglobin 12.4 (*)    HCT 35.7 (*)    MCV 79.2 (*)    Neutro Abs 10.2 (*)    Lymphs Abs 0.3 (*)    Abs Immature Granulocytes 0.67 (*)    All other components within normal limits  COMPREHENSIVE METABOLIC PANEL - Abnormal; Notable for the following components:   Sodium 121 (*)    Chloride 82 (*)    Glucose, Bld 117 (*)    Creatinine, Ser 0.40 (*)    Calcium 8.2 (*)    Total Protein 6.4 (*)    Albumin 3.0 (*)    AST 42 (*)    All other components within normal limits  URINALYSIS, ROUTINE W REFLEX MICROSCOPIC - Abnormal; Notable for the following components:   Color, Urine STRAW (*)    APPearance CLEAR (*)    Specific Gravity, Urine 1.033 (*)    Hgb urine dipstick MODERATE (*)    All other components within normal limits  RESP PANEL BY RT-PCR (RSV, FLU A&B, COVID)  RVPGX2  TROPONIN I (HIGH SENSITIVITY)  TROPONIN I (HIGH SENSITIVITY)     EKG  My interpretation of EKG:  Normal sinus rate of 93, no st elevation, TWI in lead 3, normal intervals  RADIOLOGY I have reviewed the CT  head personally interpreted no evidence of intercranial hemorrhage   PROCEDURES:  Critical Care performed: No  .1-3 Lead EKG Interpretation  Performed by: Vanessa Randall, MD Authorized by: Vanessa Northwood, MD     Interpretation: normal     ECG rate:  95   ECG rate assessment: normal     Rhythm: sinus rhythm     Ectopy: none     Conduction: normal      MEDICATIONS ORDERED IN ED: Medications  sodium chloride 0.9 % bolus 500 mL (has no administration in time range)  iohexol (OMNIPAQUE) 350 MG/ML injection 100 mL (100 mLs Intravenous Contrast Given 11/12/22 2006)     IMPRESSION / MDM / [place] / ED COURSE  I reviewed the triage vital signs and the nursing notes.   Patient's presentation is most consistent with acute presentation with potential threat to life or bodily function.   Patient comes in with  what sounds like either syncopal versus less likely seizure activity.  Patient does not really remember what happened.  Will get CT head to make sure there is no evidence of large mets that could be causing a seizure as well as CT PE given there was some report of his lips may be turning discolored to make sure no pulmonary embolism given patient's history of cancer.  Will get some basic labs and keep on the cardiac monitor.  CBC shows slightly elevated white count.  Troponin was negative.  CMP shows low sodium of 121 similar to this morning.  Unclear if this was a seizure or could bit of but has been a syncopal episode.  The family is now at bedside he does report he got some pain medication prior to the event there was no seizure-like activity so could just have been a syncopal event.  He has had sodiums that have been this low previously back in January and he is now at his baseline self. 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  Will start off with some slight fluid I discussed the hospital team for admission    The patient is on the cardiac monitor to evaluate for evidence of arrhythmia and/or significant heart rate changes.      FINAL CLINICAL IMPRESSION(S) / ED DIAGNOSES   Final diagnoses:  Hyponatremia  Syncope, unspecified syncope type     Rx / DC Orders   ED Discharge Orders     None        Note:  This document was prepared using Dragon voice recognition software and may include unintentional dictation errors.   Vanessa Modale, MD 11/12/22 2229

## 2022-11-12 NOTE — ED Notes (Addendum)
Please call pt sister, Mariann Laster [phone] or [phone], with any updates

## 2022-11-13 ENCOUNTER — Ambulatory Visit: Admitting: Oncology

## 2022-11-13 ENCOUNTER — Other Ambulatory Visit: Payer: Self-pay

## 2022-11-13 ENCOUNTER — Ambulatory Visit: Payer: Medicare Other

## 2022-11-13 ENCOUNTER — Ambulatory Visit

## 2022-11-13 ENCOUNTER — Inpatient Hospital Stay: Payer: Medicare Other

## 2022-11-13 ENCOUNTER — Other Ambulatory Visit

## 2022-11-13 ENCOUNTER — Telehealth: Payer: Self-pay | Admitting: *Deleted

## 2022-11-13 ENCOUNTER — Other Ambulatory Visit: Payer: Self-pay | Admitting: Oncology

## 2022-11-13 DIAGNOSIS — C3490 Malignant neoplasm of unspecified part of unspecified bronchus or lung: Secondary | ICD-10-CM | POA: Diagnosis not present

## 2022-11-13 DIAGNOSIS — E871 Hypo-osmolality and hyponatremia: Secondary | ICD-10-CM | POA: Diagnosis not present

## 2022-11-13 DIAGNOSIS — D696 Thrombocytopenia, unspecified: Secondary | ICD-10-CM | POA: Insufficient documentation

## 2022-11-13 DIAGNOSIS — R55 Syncope and collapse: Secondary | ICD-10-CM | POA: Diagnosis not present

## 2022-11-13 DIAGNOSIS — E876 Hypokalemia: Secondary | ICD-10-CM | POA: Insufficient documentation

## 2022-11-13 LAB — COMPREHENSIVE METABOLIC PANEL
ALT: 23 U/L (ref 0–44)
AST: 45 U/L — ABNORMAL HIGH (ref 15–41)
Albumin: 3.1 g/dL — ABNORMAL LOW (ref 3.5–5.0)
Alkaline Phosphatase: 74 U/L (ref 38–126)
Anion gap: 10 (ref 5–15)
BUN: 14 mg/dL (ref 6–20)
CO2: 27 mmol/L (ref 22–32)
Calcium: 8 mg/dL — ABNORMAL LOW (ref 8.9–10.3)
Chloride: 84 mmol/L — ABNORMAL LOW (ref 98–111)
Creatinine, Ser: 0.42 mg/dL — ABNORMAL LOW (ref 0.61–1.24)
GFR, Estimated: >60 mL/min (ref >60)
Glucose, Bld: 136 mg/dL — ABNORMAL HIGH (ref 70–99)
Potassium: 3.4 mmol/L — ABNORMAL LOW (ref 3.5–5.1)
Sodium: 121 mmol/L — ABNORMAL LOW (ref 135–145)
Total Bilirubin: 0.6 mg/dL (ref 0.3–1.2)
Total Protein: 5.8 g/dL — ABNORMAL LOW (ref 6.5–8.1)

## 2022-11-13 LAB — CBC
HCT: 35.8 % — ABNORMAL LOW (ref 39.0–52.0)
Hemoglobin: 12.5 g/dL — ABNORMAL LOW (ref 13.0–17.0)
MCH: 27.4 pg (ref 26.0–34.0)
MCHC: 34.9 g/dL (ref 30.0–36.0)
MCV: 78.3 fL — ABNORMAL LOW (ref 80.0–100.0)
Platelets: 148 10*3/uL — ABNORMAL LOW (ref 150–400)
RBC: 4.57 MIL/uL (ref 4.22–5.81)
RDW: 13.4 % (ref 11.5–15.5)
WBC: 12.3 10*3/uL — ABNORMAL HIGH (ref 4.0–10.5)
nRBC: 0 % (ref 0.0–0.2)

## 2022-11-13 LAB — GLUCOSE, CAPILLARY: Glucose-Capillary: 106 mg/dL — ABNORMAL HIGH (ref 70–99)

## 2022-11-13 LAB — SODIUM: Sodium: 123 mmol/L — ABNORMAL LOW (ref 135–145)

## 2022-11-13 LAB — T4: T4, Total: 7.3 ug/dL (ref 4.5–12.0)

## 2022-11-13 MED ORDER — BENZTROPINE MESYLATE 1 MG PO TABS
2.0000 mg | ORAL_TABLET | Freq: Two times a day (BID) | ORAL | Status: DC
  Administered 2022-11-13: 2 mg via ORAL
  Filled 2022-11-13: qty 2

## 2022-11-13 MED ORDER — FLUPHENAZINE HCL 5 MG PO TABS
2.5000 mg | ORAL_TABLET | Freq: Every day | ORAL | Status: DC
  Administered 2022-11-13: 2.5 mg via ORAL
  Filled 2022-11-13 (×2): qty 1

## 2022-11-13 MED ORDER — GUAIFENESIN ER 600 MG PO TB12: 600.0000 mg | ORAL_TABLET | Freq: Two times a day (BID) | ORAL | Status: DC | PRN

## 2022-11-13 MED ORDER — SODIUM CHLORIDE 1 G PO TABS
1.0000 g | ORAL_TABLET | Freq: Three times a day (TID) | ORAL | Status: DC
  Administered 2022-11-13 (×2): 1 g via ORAL
  Filled 2022-11-13 (×2): qty 1

## 2022-11-13 MED ORDER — POTASSIUM CHLORIDE CRYS ER 20 MEQ PO TBCR
40.0000 meq | EXTENDED_RELEASE_TABLET | Freq: Once | ORAL | Status: AC
  Administered 2022-11-13: 40 meq via ORAL
  Filled 2022-11-13: qty 2

## 2022-11-13 MED ORDER — ENOXAPARIN SODIUM 40 MG/0.4ML IJ SOSY
40.0000 mg | PREFILLED_SYRINGE | INTRAMUSCULAR | Status: DC
  Filled 2022-11-13: qty 0.4

## 2022-11-13 MED ORDER — SIMVASTATIN 20 MG PO TABS: 20.0000 mg | ORAL_TABLET | Freq: Every day | ORAL | Status: DC

## 2022-11-13 MED ORDER — DOCUSATE SODIUM 100 MG PO CAPS: 100.0000 mg | ORAL_CAPSULE | Freq: Two times a day (BID) | ORAL | Status: DC | PRN

## 2022-11-13 MED ORDER — MELOXICAM 7.5 MG PO TABS
15.0000 mg | ORAL_TABLET | Freq: Every day | ORAL | Status: DC
  Administered 2022-11-13: 15 mg via ORAL
  Filled 2022-11-13: qty 2

## 2022-11-13 MED ORDER — ALBUTEROL SULFATE HFA 108 (90 BASE) MCG/ACT IN AERS: 2.0000 | INHALATION_SPRAY | Freq: Four times a day (QID) | RESPIRATORY_TRACT | Status: DC | PRN

## 2022-11-13 MED ORDER — ADULT MULTIVITAMIN W/MINERALS CH
1.0000 | ORAL_TABLET | Freq: Every day | ORAL | Status: DC
  Administered 2022-11-13: 1 via ORAL
  Filled 2022-11-13: qty 1

## 2022-11-13 MED ORDER — PROPRANOLOL HCL 10 MG PO TABS
5.0000 mg | ORAL_TABLET | Freq: Two times a day (BID) | ORAL | Status: DC
  Administered 2022-11-13: 5 mg via ORAL
  Filled 2022-11-13: qty 0.5

## 2022-11-13 MED ORDER — IPRATROPIUM-ALBUTEROL 0.5-2.5 (3) MG/3ML IN SOLN: 3.0000 mL | RESPIRATORY_TRACT | Status: DC | PRN

## 2022-11-13 MED ORDER — BISACODYL 5 MG PO TBEC: 5.0000 mg | DELAYED_RELEASE_TABLET | Freq: Every day | ORAL | Status: DC | PRN

## 2022-11-13 MED ORDER — SODIUM CHLORIDE 0.9% FLUSH
3.0000 mL | Freq: Two times a day (BID) | INTRAVENOUS | Status: DC
  Administered 2022-11-13 (×2): 3 mL via INTRAVENOUS

## 2022-11-13 MED ORDER — HYDROCODONE-ACETAMINOPHEN 5-325 MG PO TABS
1.0000 | ORAL_TABLET | Freq: Four times a day (QID) | ORAL | Status: DC | PRN
  Administered 2022-11-13: 2 via ORAL
  Filled 2022-11-13: qty 2

## 2022-11-13 MED ORDER — POTASSIUM CHLORIDE CRYS ER 10 MEQ PO TBCR: 10.0000 meq | EXTENDED_RELEASE_TABLET | Freq: Two times a day (BID) | ORAL | 0 refills | Status: DC

## 2022-11-13 MED ORDER — SODIUM CHLORIDE 0.9 % IV SOLN: INTRAVENOUS | Status: DC

## 2022-11-13 MED ORDER — MORPHINE SULFATE (PF) 2 MG/ML IV SOLN: 2.0000 mg | INTRAVENOUS | Status: DC | PRN

## 2022-11-13 MED ORDER — ONDANSETRON HCL 4 MG PO TABS: 4.0000 mg | ORAL_TABLET | Freq: Four times a day (QID) | ORAL | Status: DC | PRN

## 2022-11-13 MED ORDER — ONDANSETRON HCL 4 MG/2ML IJ SOLN: 4.0000 mg | Freq: Four times a day (QID) | INTRAMUSCULAR | Status: DC | PRN

## 2022-11-13 MED ORDER — AMLODIPINE BESYLATE 10 MG PO TABS
10.0000 mg | ORAL_TABLET | Freq: Every day | ORAL | Status: DC
  Administered 2022-11-13: 10 mg via ORAL
  Filled 2022-11-13: qty 1

## 2022-11-13 MED ORDER — ALBUTEROL SULFATE (2.5 MG/3ML) 0.083% IN NEBU: 2.5000 mg | INHALATION_SOLUTION | Freq: Four times a day (QID) | RESPIRATORY_TRACT | Status: DC | PRN

## 2022-11-13 MED FILL — Etoposide Inj 1 GM/50ML (20 MG/ML): INTRAVENOUS | Qty: 10 | Status: AC

## 2022-11-13 MED FILL — Dexamethasone Sodium Phosphate Inj 100 MG/10ML: INTRAMUSCULAR | Qty: 1 | Status: AC

## 2022-11-13 NOTE — Plan of Care (Signed)
  Problem: Education: Goal: Knowledge of condition and prescribed therapy will improve Outcome: Progressing   Problem: Clinical Measurements: Goal: Respiratory complications will improve Outcome: Progressing   Problem: Clinical Measurements: Goal: Cardiovascular complication will be avoided Outcome: Progressing   Problem: Pain Managment: Goal: General experience of comfort will improve Outcome: Progressing   Problem: Safety: Goal: Ability to remain free from injury will improve Outcome: Progressing

## 2022-11-13 NOTE — TOC CM/SW Note (Addendum)
Patient is active with [facility] for PT and OT. Will follow progress and notify liaison once patient is stable for discharge.  Dayton Scrape, CSW [phone]  1:14 pm: Patient has orders to discharge home today. [facility] liaison is aware. No further concerns. CSW signing off.  Dayton Scrape, [place]

## 2022-11-13 NOTE — Telephone Encounter (Signed)
Called patient's sister to advise her that Dr. Baruch Gouty has canceled his last appointment and his future appointments will be mailed. She verbalized understanding.

## 2022-11-13 NOTE — Discharge Summary (Signed)
Physician Discharge Summary   Patient: Trevor Montgomery MRN: [id number] DOB: 1961-03-06  Admit date:     11/12/2022  Discharge date: 11/13/22  Discharge Physician: Sharen Hones   PCP: Tracie Harrier, MD   Recommendations at discharge:   Follow-up with PCP in 1 week. Fluid restriction 1200 mm/day, no restriction on salt  Discharge Diagnoses: Principal Problem:   Syncope and collapse Active Problems:   Centrilobular emphysema (HCC)   Tobacco abuse   Hyponatremia   Schizophrenia (HCC)   Hypertension   Small cell lung cancer (HCC)   Thrombocytopenia (HCC)   Hypokalemia Chronic hypoxemic respiratory failure. Resolved Problems:   * No resolved hospital problems. *  Hospital Course:  Trevor Montgomery is a 62 y.o. 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  In the hospital, sodium level was 121, he received normal saline without a change in sodium level.  Patient was placed on fluid restriction for 1200 mL/day, restarted sodium chloride 1 g 3 times a day.  Sodium level trending up to 123.  At this point, patient will be continued on fluid restriction at this level.  Follow-up with PCP in the near future. Patient blood pressure was not low while in the hospital, telemetry did not show any arrhythmia.  Echocardiogram showed normal ejection fraction without significant valvular disease.  Patient condition most likely due to vasovagal reaction in junction with severe hyponatremia.  Head CT was negative for acute changes.  Assessment and Plan: Syncope and collapse. Symptoms occurred after chemotherapy and radiation.  Appeared to be a vasovagal reaction with the conjunction with hyponatremia.  Received fluids, condition  improved.  Hyponatremia.   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 Continue fluid restriction at 1200 mL/day.  Continue salt tablets 1 g 3 times a day.  Stage IV lung cancer. Follow-up with oncology as outpatient.  Essential hypertension. Resume home regimen. Schizophrenia. Condition stable.  COPD Tobacco abuse No COPD exacerbation.      Consultants: None Procedures performed: None  Disposition: Home Diet recommendation:  Discharge Diet Orders (From admission, onward)     Start     Ordered   11/13/22 0000  Diet general       Comments: Low fat diet, fluid restriction 1270ml/day, no restriction on salt.   11/13/22 1301           Regular diet with fluid restriction as above DISCHARGE MEDICATION: Allergies as of 11/13/2022   No Known Allergies      Medication List     STOP taking these medications    ondansetron 8 MG tablet Commonly known as: Zofran   prochlorperazine 10 MG tablet Commonly known as: COMPAZINE       TAKE these medications    albuterol 108 (90 Base) MCG/ACT inhaler Commonly known as: VENTOLIN HFA Inhale 2 puffs into the lungs every 6 (six) hours as needed for wheezing or shortness of breath.   amLODipine 10 MG tablet Commonly known as: NORVASC Take 1 tablet (10 mg total) by mouth daily.   benztropine 2 MG tablet Commonly known as: COGENTIN Take 2 mg by mouth 2 (two) times daily.   bisacodyl 5 MG EC tablet Commonly known as: DULCOLAX Take 1 tablet (5 mg total) by mouth daily as needed for moderate constipation.   dexamethasone 4 MG tablet  Commonly known as: DECADRON Take 1 tablet (4 mg total) by mouth daily.   docusate sodium 100 MG capsule Commonly known as: COLACE Take 1 capsule (100 mg total) by mouth 2 (two) times daily as needed for mild constipation.   fluPHENAZine 5 MG tablet Commonly known as: PROLIXIN Take 2.5 mg by mouth daily.   guaiFENesin 600 MG 12 hr tablet Commonly known as: MUCINEX Take 1 tablet (600 mg total)  by mouth 2 (two) times daily as needed for cough.   HYDROcodone-acetaminophen 5-325 MG tablet Commonly known as: NORCO/VICODIN Take 1-2 tablets by mouth every 6 (six) hours as needed for moderate pain.   ipratropium-albuterol 0.5-2.5 (3) MG/3ML Soln Commonly known as: DUONEB Take 3 mLs by nebulization every 4 (four) hours as needed. What changed: Another medication with the same name was removed. Continue taking this medication, and follow the directions you see here.   lidocaine-prilocaine cream Commonly known as: EMLA Apply to affected area once   meloxicam 15 MG tablet Commonly known as: MOBIC Take 15 mg by mouth daily.   Multi-Vitamin tablet Take 1 tablet by mouth daily.   nicotine 21 mg/24hr patch Commonly known as: NICODERM CQ - dosed in mg/24 hours Place 21 mg onto the skin daily.   potassium chloride 10 MEQ tablet Commonly known as: KLOR-CON M Take 1 tablet (10 mEq total) by mouth 2 (two) times daily for 5 days.   propranolol 10 MG tablet Commonly known as: INDERAL Take 0.5 tablets (5 mg total) by mouth 2 (two) times daily.   simvastatin 20 MG tablet Commonly known as: ZOCOR Take 20 mg by mouth at bedtime.   sodium chloride 1 g tablet Take 1 tablet (1 g total) by mouth 3 (three) times daily.   Vitamin E 400 units Tabs Take 400 Units by mouth daily.        Follow-up Information     Tracie Harrier, MD Follow up in 1 week(s).   Specialty: Internal Medicine Contact information: [address] [place] [address] [phone]                Discharge Exam: Danley Danker Weights   11/13/22 0028 11/13/22 0549  Weight: 78.8 kg 78.7 kg   General exam: Appears calm and comfortable  Respiratory system: Clear to auscultation. Respiratory effort normal. Cardiovascular system: S1 & S2 heard, RRR. No JVD, murmurs, rubs, gallops or clicks. No pedal edema. Gastrointestinal system: Abdomen is nondistended, soft and nontender. No  organomegaly or masses felt. Normal bowel sounds heard. Central nervous system: Alert and oriented. No focal neurological deficits. Extremities: Symmetric 5 x 5 power. Skin: No rashes, lesions or ulcers Psychiatry: Judgement and insight appear normal. Mood & affect appropriate.    Condition at discharge: good  The results of significant diagnostics from this hospitalization (including imaging, microbiology, ancillary and laboratory) are listed below for reference.   Imaging Studies: CT HEAD WO CONTRAST (5MM)  Result Date: 11/12/2022 CLINICAL DATA:  Altered mental status EXAM: CT HEAD WITHOUT CONTRAST TECHNIQUE: Contiguous axial images were obtained from the base of the skull through the vertex without intravenous contrast. RADIATION DOSE REDUCTION: This exam was performed according to the departmental dose-optimization program which includes automated exposure control, adjustment of the mA and/or kV according to patient size and/or use of iterative reconstruction technique. COMPARISON:  CT brain 10/11/2022 FINDINGS: Brain: No acute territorial infarction, hemorrhage or intracranial mass. The ventricles are nonenlarged. Vascular: No hyperdense vessels.  Carotid vascular calcification Skull: Normal. Negative  for fracture or focal lesion. Sinuses/Orbits: No acute finding. Other: None IMPRESSION: Negative non contrasted CT appearance of the brain. Electronically Signed   By: Donavan Foil M.D.   On: 11/12/2022 20:48   CT Angio Chest PE W and/or Wo Contrast  Result Date: 11/12/2022 CLINICAL DATA:  Syncope altered mental status EXAM: CT ANGIOGRAPHY CHEST WITH CONTRAST TECHNIQUE: Multidetector CT imaging of the chest was performed using the standard protocol during bolus administration of intravenous contrast. Multiplanar CT image reconstructions and MIPs were obtained to evaluate the vascular anatomy. RADIATION DOSE REDUCTION: This exam was performed according to the departmental dose-optimization  program which includes automated exposure control, adjustment of the mA and/or kV according to patient size and/or use of iterative reconstruction technique. CONTRAST:  148mL OMNIPAQUE IOHEXOL 350 MG/ML SOLN COMPARISON:  Chest x-ray 11/11/2022, PET CT 11/04/2022, chest CT 10/11/2022, FINDINGS: Cardiovascular: Satisfactory opacification of the pulmonary arteries to the segmental level. No evidence of pulmonary embolism. Nonaneurysmal aorta. Normal cardiac size. No pericardial effusion. Mediastinum/Nodes: Midline trachea. No thyroid mass. Interval decrease in previously noted left upper lobe consolidative focus with residual linear density in the region, series 6, image 66. Left lower lobe describes spiculated focus is also decreased compared to the prior chest CT without measurable mass in the region, series 6, image 45. Interval decrease in left AP window/hilar mass compared to the prior chest CT with residual soft tissue density. 10 mm high prevascular node, series 4, image 38, previously this measured 5 mm on interval PET CT. No new or enlarging lymph nodes. Esophagus within normal limits. Lungs/Pleura: Emphysema. No acute airspace disease, pleural effusion, or pneumothorax. Upper Abdomen: No acute finding. Hypodense liver lesions without corresponding finding on the PET CT from 11/04/2022. For example right hepatic lobe 1.7 cm lesion, series 4, image 152. 1.7 cm hypodense lesion in the left anterior hepatic dome, series 4, image 112. 1.6 cm right adrenal nodule, hypermetabolic on previous PET CT. Musculoskeletal: Subtle lucent lesions within the spine, corresponding to history of metastatic disease, this is most evident involving right aspect of T12 vertebral body where there is osseous destruction and soft tissue mass measuring approximately 2.9 x 2.5 cm on series 4, image 128. Review of the MIP images confirms the above findings. IMPRESSION: 1. Negative for acute pulmonary embolus. 2. Emphysema. Interval  decrease in previously noted left AP window/hilar mass and or nodes compared to the prior chest CT. Decreased left lower lobe pulmonary nodule and consolidative focus in the left upper lobe compared to the prior CT. 3. 10 mm high prevascular lymph node, previously this measured 5 mm on interval PET CT and was hypermetabolic consistent with metastatic disease. 4. Several hypodense liver lesions without corresponding finding on the PET CT from 11/04/2022, but appear new or increased compared to the prior abdomen CT and concerning for metastatic disease. 5. 1.6 cm right adrenal nodule, hypermetabolic on previous PET CT, suspicious for metastatic disease. 6. Findings consistent with skeletal metastatic disease. Emphysema (ICD10-J43.9). Electronically Signed   By: Donavan Foil M.D.   On: 11/12/2022 20:45   DG Chest 2 View  Result Date: 11/11/2022 CLINICAL DATA:  Small cell lung carcinoma, shortness of breath, cough and wheezing. EXAM: CHEST - 2 VIEW COMPARISON:  10/11/2022 FINDINGS: Interval right jugular Port-A-Cath placement with the catheter tip in the lower SVC. The heart size and mediastinal contours are within normal limits. Decreased prominence soft tissue in the left hilar region likely consistent with response to treatment of known small cell lung carcinoma  centered in the left superior hilar region and extending throughout the hilum and into the mediastinum by prior imaging. No pulmonary edema, pleural fluid or pneumothorax. The visualized skeletal structures are unremarkable. IMPRESSION: Decreased prominence of soft tissue in the left hilar region likely consistent with response to treatment of known small cell lung carcinoma centered in the left superior hilar region and extending into the mediastinum. Electronically Signed   By: Aletta Edouard M.D.   On: 11/11/2022 15:54   IR IMAGING GUIDED PORT INSERTION  Result Date: 11/04/2022 INDICATION: IV access for chemotherapy EXAM: Chest port placement  using ultrasound and fluoroscopic guidance MEDICATIONS: Documented in the EMR ANESTHESIA/SEDATION: Moderate (conscious) sedation was employed during this procedure. A total of Versed 1.5 mg and Fentanyl 100 mcg was administered intravenously. Moderate Sedation Time: 22 minutes. The patient's level of consciousness and vital signs were monitored continuously by radiology nursing throughout the procedure under my direct supervision. FLUOROSCOPY TIME:  Fluoroscopy Time: 0.4 minutes (1 mGy) COMPLICATIONS: None immediate. PROCEDURE: Informed written consent was obtained from the patient after a thorough discussion of the procedural risks, benefits and alternatives. All questions were addressed. Maximal Sterile Barrier Technique was utilized including caps, mask, sterile gowns, sterile gloves, sterile drape, hand hygiene and skin antiseptic. A timeout was performed prior to the initiation of the procedure. The patient was placed supine on the exam table. The right neck and chest was prepped and draped in the standard sterile fashion. A preliminary ultrasound of the right neck was performed and demonstrates a patent right internal jugular vein. A permanent ultrasound image was stored in the electronic medical record. The overlying skin was anesthetized with 1% Lidocaine. Using ultrasound guidance, access was obtained into the right internal jugular vein using a 21 gauge micropuncture set. A wire was advanced into the SVC, a short incision was made at the puncture site, and serial dilatation performed. Next, in an ipsilateral infraclavicular location, an incision was made at the site of the subcutaneous reservoir. Blunt dissection was used to open a pocket to contain the reservoir. A subcutaneous tunnel was then created from the port site to the puncture site. A(n) 8 Fr single lumen catheter was advanced through the tunnel. The catheter was attached to the port and this was placed in the subcutaneous pocket. Under  fluoroscopic guidance, a peel away sheath was placed, and the catheter was trimmed to the appropriate length and was advanced into the central veins. The catheter length is 25 cm. The tip of the catheter lies near the superior cavoatrial junction. The port flushes and aspirates appropriately. The port was flushed and locked with heparinized saline. The port pocket was closed in 2 layers using 3-0 and 4-0 Vicryl/absorbable suture. Dermabond was also applied to both incisions. The patient tolerated the procedure well and was transferred to recovery in stable condition. IMPRESSION: Successful placement of a right-sided chest port via the right internal jugular vein. The port is ready for immediate use. Electronically Signed   By: Albin Felling M.D.   On: 11/04/2022 16:25   NM PET Image Initial (PI) Skull Base To Thigh  Result Date: 11/04/2022 CLINICAL DATA:  Initial treatment strategy for small-cell lung cancer. EXAM: NUCLEAR MEDICINE PET SKULL BASE TO THIGH TECHNIQUE: 8.5 mCi F-18 FDG was injected intravenously. Full-ring PET imaging was performed from the skull base to thigh after the radiotracer. CT data was obtained and used for attenuation correction and anatomic localization. Fasting blood glucose: 100 mg/dl COMPARISON:  10/11/2022 abdominopelvic CT.  Chest  CT 10/11/2022 FINDINGS: Mediastinal blood pool activity: SUV max 1.1 Liver activity: SUV max NA NECK: No areas of abnormal hypermetabolism. Incidental CT findings: No gross abnormality identified. Degradation secondary to patient marked kyphosis. CHEST: A high left mediastinal/prevascular node measures 5 mm and a S.U.V. max of 2.4 on 58/2. Hypermetabolism corresponding to the dominant left suprahilar/AP window mass on recent CT. Example at a S.U.V. max of 3.6 on 67/2. No pulmonary parenchymal hypermetabolism identified. Incidental CT findings: Centrilobular emphysema. Right lower lobe scarring. Interstitial thickening within the left upper lobe is likely  due to postobstructive pneumonitis. ABDOMEN/PELVIS: No abdominopelvic nodal hypermetabolism. Right adrenal nodule measures 1.3 cm, 37 HU, and a S.U.V. max of 3.2. New since 2010. Incidental CT findings: Low-density bilateral renal lesions of maximally 3.2 cm are likely cysts . In the absence of clinically indicated signs/symptoms require(s) no independent follow-up. Right-sided dependent bladder stones of up to 4 mm. Mild prostatomegaly. SKELETON: Relatively diffuse osseous metastasis. Example L5 vertebral body at a S.U.V. max of 7.5. Posterior left iliac at a S.U.V. max of 7.7 on 175/2. Incidental CT findings: Remote right rib fractures. IMPRESSION: 1. Hypermetabolic thoracic nodes, including the dominant left suprahilar/AP window nodal mass. 2. Diffuse osseous metastasis. 3. Hypermetabolic right adrenal nodule, suspicious for metastatic disease. 4. Incidental findings, including: Prostatomegaly with bladder calculi. Emphysema (ICD10-J43.9). Electronically Signed   By: Abigail Miyamoto M.D.   On: 11/04/2022 16:17    Microbiology: Results for orders placed or performed during the hospital encounter of 11/12/22  Resp panel by RT-PCR (RSV, Flu A&B, Covid) Anterior Nasal Swab     Status: None   Collection Time: 11/12/22  6:52 PM   Specimen: Anterior Nasal Swab  Result Value Ref Range Status   SARS Coronavirus 2 by RT PCR NEGATIVE NEGATIVE Final    Comment: (NOTE) SARS-CoV-2 target nucleic acids are NOT DETECTED.  The SARS-CoV-2 RNA is generally detectable in upper respiratory specimens during the acute phase of infection. The lowest concentration of SARS-CoV-2 viral copies this assay can detect is 138 copies/mL. A negative result does not preclude SARS-Cov-2 infection and should not be used as the sole basis for treatment or other patient management decisions. A negative result may occur with  improper specimen collection/handling, submission of specimen other than nasopharyngeal swab, presence of viral  mutation(s) within the areas targeted by this assay, and inadequate number of viral copies(<138 copies/mL). A negative result must be combined with clinical observations, patient history, and epidemiological information. The expected result is Negative.  Fact Sheet for Patients:  EntrepreneurPulse.com.au  Fact Sheet for Healthcare Providers:  IncredibleEmployment.be  This test is no t yet approved or cleared by the [place] FDA and  has been authorized for detection and/or diagnosis of SARS-CoV-2 by FDA under an Emergency Use Authorization (EUA). This EUA will remain  in effect (meaning this test can be used) for the duration of the COVID-19 declaration under Section 564(b)(1) of the Act, 21 U.S.C.section 360bbb-3(b)(1), unless the authorization is terminated  or revoked sooner.       Influenza A by PCR NEGATIVE NEGATIVE Final   Influenza B by PCR NEGATIVE NEGATIVE Final    Comment: (NOTE) The Xpert Xpress SARS-CoV-2/FLU/RSV plus assay is intended as an aid in the diagnosis of influenza from Nasopharyngeal swab specimens and should not be used as a sole basis for treatment. Nasal washings and aspirates are unacceptable for Xpert Xpress SARS-CoV-2/FLU/RSV testing.  Fact Sheet for Patients: EntrepreneurPulse.com.au  Fact Sheet for Healthcare Providers: IncredibleEmployment.be  This  test is not yet approved or cleared by the [place] and has been authorized for detection and/or diagnosis of SARS-CoV-2 by FDA under an Emergency Use Authorization (EUA). This EUA will remain in effect (meaning this test can be used) for the duration of the COVID-19 declaration under Section 564(b)(1) of the Act, 21 U.S.C. section 360bbb-3(b)(1), unless the authorization is terminated or revoked.     Resp Syncytial Virus by PCR NEGATIVE NEGATIVE Final    Comment: (NOTE) Fact Sheet for  Patients: EntrepreneurPulse.com.au  Fact Sheet for Healthcare Providers: IncredibleEmployment.be  This test is not yet approved or cleared by the [place] FDA and has been authorized for detection and/or diagnosis of SARS-CoV-2 by FDA under an Emergency Use Authorization (EUA). This EUA will remain in effect (meaning this test can be used) for the duration of the COVID-19 declaration under Section 564(b)(1) of the Act, 21 U.S.C. section 360bbb-3(b)(1), unless the authorization is terminated or revoked.  Performed at [facility], [address] [place], [place] [address]     Labs: CBC: Recent Labs  Lab 11/07/22 1243 11/12/22 1007 11/12/22 1955 11/13/22 0112  WBC 12.9* 10.2 11.7* 12.3*  NEUTROABS  --  8.9* 10.2*  --   HGB 13.9 13.1 12.4* 12.5*  HCT 42.5 37.9* 35.7* 35.8*  MCV 83.2 79.8* 79.2* 78.3*  PLT 233 164 179 357*   Basic Metabolic Panel: Recent Labs  Lab 11/12/22 1007 11/12/22 1955 11/13/22 0112 11/13/22 1207  NA 121* 121* 121* 123*  K 3.2* 3.8 3.4*  --   CL 80* 82* 84*  --   CO2 27 30 27   --   GLUCOSE 248* 117* 136*  --   BUN 17 16 14   --   CREATININE 0.57* 0.40* 0.42*  --   CALCIUM 8.4* 8.2* 8.0*  --    Liver Function Tests: Recent Labs  Lab 11/12/22 1007 11/12/22 1955 11/13/22 0112  AST 44* 42* 45*  ALT 21 21 23   ALKPHOS 80 71 74  BILITOT 0.6 0.6 0.6  PROT 6.8 6.4* 5.8*  ALBUMIN 3.3* 3.0* 3.1*   CBG: Recent Labs  Lab 11/13/22 0431  GLUCAP 106*    Discharge time spent: greater than 30 minutes.  Signed: Sharen Hones, MD [facility] 11/13/2022

## 2022-11-14 ENCOUNTER — Ambulatory Visit: Payer: Medicare Other

## 2022-11-14 ENCOUNTER — Inpatient Hospital Stay: Payer: Medicare Other

## 2022-11-14 ENCOUNTER — Ambulatory Visit

## 2022-11-14 DIAGNOSIS — Z5111 Encounter for antineoplastic chemotherapy: Secondary | ICD-10-CM | POA: Diagnosis not present

## 2022-11-14 DIAGNOSIS — C3490 Malignant neoplasm of unspecified part of unspecified bronchus or lung: Secondary | ICD-10-CM

## 2022-11-14 MED ORDER — SODIUM CHLORIDE 0.9 % IV SOLN
Freq: Once | INTRAVENOUS | Status: AC
  Filled 2022-11-14: qty 250

## 2022-11-14 MED ORDER — SODIUM CHLORIDE 0.9 % IV SOLN
10.0000 mg | Freq: Once | INTRAVENOUS | Status: AC
  Administered 2022-11-14: 10 mg via INTRAVENOUS
  Filled 2022-11-14: qty 1

## 2022-11-14 MED ORDER — SODIUM CHLORIDE 0.9 % IV SOLN
100.0000 mg/m2 | Freq: Once | INTRAVENOUS | Status: AC
  Administered 2022-11-14: 200 mg via INTRAVENOUS
  Filled 2022-11-14 (×2): qty 10

## 2022-11-14 MED FILL — Dexamethasone Sodium Phosphate Inj 100 MG/10ML: INTRAMUSCULAR | Qty: 1 | Status: AC

## 2022-11-14 NOTE — Patient Instructions (Signed)
[facility]  Discharge Instructions: Thank you for choosing [facility] to provide your oncology and hematology care.  If you have a lab appointment with the [facility], please go directly to the [facility] and check in at the registration area.  Wear comfortable clothing and clothing appropriate for easy access to any Portacath or PICC line.   We strive to give you quality time with your provider. You may need to reschedule your appointment if you arrive late (15 or more minutes).  Arriving late affects you and other patients whose appointments are after yours.  Also, if you miss three or more appointments without notifying the office, you may be dismissed from the clinic at the provider's discretion.      For prescription refill requests, have your pharmacy contact our office and allow 72 hours for refills to be completed.    Today you received the following chemotherapy and/or immunotherapy agents Etoposide      To help prevent nausea and vomiting after your treatment, we encourage you to take your nausea medication as directed.  BELOW ARE SYMPTOMS THAT SHOULD BE REPORTED IMMEDIATELY: *FEVER GREATER THAN 100.4 F (38 C) OR HIGHER *CHILLS OR SWEATING *NAUSEA AND VOMITING THAT IS NOT CONTROLLED WITH YOUR NAUSEA MEDICATION *UNUSUAL SHORTNESS OF BREATH *UNUSUAL BRUISING OR BLEEDING *URINARY PROBLEMS (pain or burning when urinating, or frequent urination) *BOWEL PROBLEMS (unusual diarrhea, constipation, pain near the anus) TENDERNESS IN MOUTH AND THROAT WITH OR WITHOUT PRESENCE OF ULCERS (sore throat, sores in mouth, or a toothache) UNUSUAL RASH, SWELLING OR PAIN  UNUSUAL VAGINAL DISCHARGE OR ITCHING   Items with * indicate a potential emergency and should be followed up as soon as possible or go to the Emergency Department if any problems should occur.  Please show the CHEMOTHERAPY ALERT CARD or IMMUNOTHERAPY ALERT CARD at check-in to  the Emergency Department and triage nurse.  Should you have questions after your visit or need to cancel or reschedule your appointment, please contact [facility]  [phone] and follow the prompts.  Office hours are 8:00 a.m. to 4:30 p.m. Monday - Friday. Please note that voicemails left after 4:00 p.m. may not be returned until the following business day.  We are closed weekends and major holidays. You have access to a nurse at all times for urgent questions. Please call the main number to the clinic [phone] and follow the prompts.  For any non-urgent questions, you may also contact your provider using MyChart. We now offer e-Visits for anyone 109 and older to request care online for non-urgent symptoms. For details visit [email or web address].   Also download the MyChart app! Go to the app store, search "MyChart", open the app, select [facility], and log in with your MyChart username and password.

## 2022-11-15 ENCOUNTER — Inpatient Hospital Stay: Payer: Medicare Other

## 2022-11-15 ENCOUNTER — Inpatient Hospital Stay: Payer: Medicare Other | Admitting: Licensed Clinical Social Worker

## 2022-11-15 ENCOUNTER — Ambulatory Visit: Payer: Medicare Other

## 2022-11-15 ENCOUNTER — Other Ambulatory Visit: Payer: Self-pay

## 2022-11-15 VITALS — BP 127/81 | HR 100 | Temp 97.6°F | Resp 18

## 2022-11-15 DIAGNOSIS — Z5111 Encounter for antineoplastic chemotherapy: Secondary | ICD-10-CM | POA: Diagnosis not present

## 2022-11-15 DIAGNOSIS — C3490 Malignant neoplasm of unspecified part of unspecified bronchus or lung: Secondary | ICD-10-CM

## 2022-11-15 MED ORDER — HEPARIN SOD (PORK) LOCK FLUSH 100 UNIT/ML IV SOLN
500.0000 [IU] | Freq: Once | INTRAVENOUS | Status: AC | PRN
  Administered 2022-11-15: 500 [IU]
  Filled 2022-11-15: qty 5

## 2022-11-15 MED ORDER — SODIUM CHLORIDE 0.9 % IV SOLN
100.0000 mg/m2 | Freq: Once | INTRAVENOUS | Status: AC
  Administered 2022-11-15: 200 mg via INTRAVENOUS
  Filled 2022-11-15: qty 10

## 2022-11-15 MED ORDER — SODIUM CHLORIDE 0.9 % IV SOLN
10.0000 mg | Freq: Once | INTRAVENOUS | Status: AC
  Administered 2022-11-15: 10 mg via INTRAVENOUS
  Filled 2022-11-15: qty 1
  Filled 2022-11-15: qty 10

## 2022-11-15 MED ORDER — SODIUM CHLORIDE 0.9 % IV SOLN
Freq: Once | INTRAVENOUS | Status: AC
  Filled 2022-11-15: qty 250

## 2022-11-15 NOTE — Patient Instructions (Signed)
[facility]  Discharge Instructions: Thank you for choosing [facility] to provide your oncology and hematology care.  If you have a lab appointment with the [facility], please go directly to the [facility] and check in at the registration area.  Wear comfortable clothing and clothing appropriate for easy access to any Portacath or PICC line.   We strive to give you quality time with your provider. You may need to reschedule your appointment if you arrive late (15 or more minutes).  Arriving late affects you and other patients whose appointments are after yours.  Also, if you miss three or more appointments without notifying the office, you may be dismissed from the clinic at the provider's discretion.      For prescription refill requests, have your pharmacy contact our office and allow 72 hours for refills to be completed.    Today you received the following chemotherapy and/or immunotherapy agents Etoposide      To help prevent nausea and vomiting after your treatment, we encourage you to take your nausea medication as directed.  BELOW ARE SYMPTOMS THAT SHOULD BE REPORTED IMMEDIATELY: *FEVER GREATER THAN 100.4 F (38 C) OR HIGHER *CHILLS OR SWEATING *NAUSEA AND VOMITING THAT IS NOT CONTROLLED WITH YOUR NAUSEA MEDICATION *UNUSUAL SHORTNESS OF BREATH *UNUSUAL BRUISING OR BLEEDING *URINARY PROBLEMS (pain or burning when urinating, or frequent urination) *BOWEL PROBLEMS (unusual diarrhea, constipation, pain near the anus) TENDERNESS IN MOUTH AND THROAT WITH OR WITHOUT PRESENCE OF ULCERS (sore throat, sores in mouth, or a toothache) UNUSUAL RASH, SWELLING OR PAIN  UNUSUAL VAGINAL DISCHARGE OR ITCHING   Items with * indicate a potential emergency and should be followed up as soon as possible or go to the Emergency Department if any problems should occur.  Please show the CHEMOTHERAPY ALERT CARD or IMMUNOTHERAPY ALERT CARD at check-in to  the Emergency Department and triage nurse.  Should you have questions after your visit or need to cancel or reschedule your appointment, please contact [facility]  [phone] and follow the prompts.  Office hours are 8:00 a.m. to 4:30 p.m. Monday - Friday. Please note that voicemails left after 4:00 p.m. may not be returned until the following business day.  We are closed weekends and major holidays. You have access to a nurse at all times for urgent questions. Please call the main number to the clinic [phone] and follow the prompts.  For any non-urgent questions, you may also contact your provider using MyChart. We now offer e-Visits for anyone 63 and older to request care online for non-urgent symptoms. For details visit [email or web address].   Also download the MyChart app! Go to the app store, search "MyChart", open the app, select [facility], and log in with your MyChart username and password.

## 2022-11-15 NOTE — Progress Notes (Addendum)
[facility] CSW Progress Note  Holiday representative met with caregiver to discuss care giving and additional supports.  CSW gave Ms. Creedle information on local MCO/LME, Special Assistance medicaid and Cap medicaid program. CSW will meet with Ms. Creedle next week, once schedules are coordinated.    Adelene Amas, LCSW

## 2022-11-18 ENCOUNTER — Inpatient Hospital Stay: Payer: Medicare Other

## 2022-11-18 DIAGNOSIS — Z5111 Encounter for antineoplastic chemotherapy: Secondary | ICD-10-CM | POA: Diagnosis not present

## 2022-11-18 DIAGNOSIS — C3490 Malignant neoplasm of unspecified part of unspecified bronchus or lung: Secondary | ICD-10-CM

## 2022-11-18 MED ORDER — PEGFILGRASTIM-CBQV 6 MG/0.6ML ~~LOC~~ SOSY
6.0000 mg | PREFILLED_SYRINGE | Freq: Once | SUBCUTANEOUS | Status: AC
  Administered 2022-11-18: 6 mg via SUBCUTANEOUS
  Filled 2022-11-18: qty 0.6

## 2022-11-19 ENCOUNTER — Encounter: Payer: Self-pay | Admitting: Oncology

## 2022-11-19 ENCOUNTER — Inpatient Hospital Stay: Payer: Medicare Other

## 2022-11-19 ENCOUNTER — Inpatient Hospital Stay (HOSPITAL_BASED_OUTPATIENT_CLINIC_OR_DEPARTMENT_OTHER): Payer: Medicare Other | Admitting: Oncology

## 2022-11-19 VITALS — BP 115/81 | HR 78 | Temp 96.2°F | Resp 16 | Ht 69.0 in | Wt 161.0 lb

## 2022-11-19 DIAGNOSIS — C3490 Malignant neoplasm of unspecified part of unspecified bronchus or lung: Secondary | ICD-10-CM

## 2022-11-19 DIAGNOSIS — Z5111 Encounter for antineoplastic chemotherapy: Secondary | ICD-10-CM | POA: Diagnosis not present

## 2022-11-19 LAB — TSH: TSH: 3.84 u[IU]/mL (ref 0.350–4.500)

## 2022-11-19 LAB — CBC WITH DIFFERENTIAL/PLATELET
Abs Immature Granulocytes: 0.43 10*3/uL — ABNORMAL HIGH (ref 0.00–0.07)
Basophils Absolute: 0.1 10*3/uL (ref 0.0–0.1)
Basophils Relative: 1 %
Eosinophils Absolute: 0.1 10*3/uL (ref 0.0–0.5)
Eosinophils Relative: 1 %
HCT: 38.1 % — ABNORMAL LOW (ref 39.0–52.0)
Hemoglobin: 12.9 g/dL — ABNORMAL LOW (ref 13.0–17.0)
Immature Granulocytes: 4 %
Lymphocytes Relative: 4 %
Lymphs Abs: 0.5 10*3/uL — ABNORMAL LOW (ref 0.7–4.0)
MCH: 27.4 pg (ref 26.0–34.0)
MCHC: 33.9 g/dL (ref 30.0–36.0)
MCV: 81.1 fL (ref 80.0–100.0)
Monocytes Absolute: 0.1 10*3/uL (ref 0.1–1.0)
Monocytes Relative: 1 %
Neutro Abs: 10.3 10*3/uL — ABNORMAL HIGH (ref 1.7–7.7)
Neutrophils Relative %: 89 %
Platelets: 218 10*3/uL (ref 150–400)
RBC: 4.7 MIL/uL (ref 4.22–5.81)
RDW: 13.8 % (ref 11.5–15.5)
Smear Review: NORMAL
WBC: 11.5 10*3/uL — ABNORMAL HIGH (ref 4.0–10.5)
nRBC: 0 % (ref 0.0–0.2)

## 2022-11-19 LAB — COMPREHENSIVE METABOLIC PANEL
ALT: 22 U/L (ref 0–44)
AST: 23 U/L (ref 15–41)
Albumin: 3.3 g/dL — ABNORMAL LOW (ref 3.5–5.0)
Alkaline Phosphatase: 75 U/L (ref 38–126)
Anion gap: 8 (ref 5–15)
BUN: 19 mg/dL (ref 6–20)
CO2: 30 mmol/L (ref 22–32)
Calcium: 8.5 mg/dL — ABNORMAL LOW (ref 8.9–10.3)
Chloride: 92 mmol/L — ABNORMAL LOW (ref 98–111)
Creatinine, Ser: 0.61 mg/dL (ref 0.61–1.24)
GFR, Estimated: >60 mL/min (ref >60)
Glucose, Bld: 85 mg/dL (ref 70–99)
Potassium: 3.4 mmol/L — ABNORMAL LOW (ref 3.5–5.1)
Sodium: 130 mmol/L — ABNORMAL LOW (ref 135–145)
Total Bilirubin: 0.8 mg/dL (ref 0.3–1.2)
Total Protein: 6.2 g/dL — ABNORMAL LOW (ref 6.5–8.1)

## 2022-11-19 NOTE — Progress Notes (Signed)
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  INTERVAL HISTORY: Patient returns to clinic today for repeat laboratory work, further evaluation, and assess his toleration of cycle 1 of treatment.  He was admitted to the hospital for 24 hours after day 1 of treatment, but proceeded completing his chemotherapy with a 24-hour delay.  He otherwise tolerated treatment well.  He continues to have left hip pain, but states this is improved since taking his narcotics as prescribed.  He otherwise feels well.  He is tolerating XRT without significant side effects.  He continues to have chronic shortness of breath and cough.  He has no neurologic complaints.  He denies any recent fevers.  He has a good appetite and denies weight loss.  He has no chest pain or hemoptysis.  He has no nausea, vomiting, constipation, or diarrhea.  He has no urinary complaints.  Patient offers no further specific complaints today.  REVIEW OF SYSTEMS:   Review of Systems  Constitutional:  Positive for malaise/fatigue. Negative for fever and weight loss.  Respiratory:  Positive for cough and shortness of breath. Negative for hemoptysis.   Cardiovascular: Negative.  Negative for chest pain and leg swelling.  Gastrointestinal: Negative.  Negative for abdominal pain and nausea.  Genitourinary: Negative.  Negative for dysuria.  Musculoskeletal:  Positive for joint pain. Negative for back pain.  Skin: Negative.  Negative for rash.  Neurological: Negative.  Negative for dizziness, focal weakness, weakness and headaches.  Psychiatric/Behavioral: Negative.  The patient is not nervous/anxious.     As per HPI. Otherwise, a complete review of systems is  negative.  PAST MEDICAL HISTORY: Past Medical History:  Diagnosis Date   Anxiety    Arthritis    Emphysema of lung ([place])    HTN (hypertension)    Hyperlipemia    Schizoaffective disorder, bipolar type ([place])    Substance abuse ([place])     PAST SURGICAL HISTORY: Past Surgical History:  Procedure Laterality Date   FINGER SURGERY     IR IMAGING GUIDED PORT INSERTION  11/04/2022   VIDEO BRONCHOSCOPY WITH ENDOBRONCHIAL ULTRASOUND Left 10/11/2022   Procedure: VIDEO BRONCHOSCOPY WITH ENDOBRONCHIAL ULTRASOUND;  Surgeon: Armando Reichert, MD;  Location: [facility] ORS;  Service: Pulmonary;  Laterality: Left;    FAMILY HISTORY: Family History  Problem Relation Age of Onset   Stroke Father    Seizures Father    Stroke Sister    Cancer Maternal Grandmother     ADVANCED DIRECTIVES (Y/N):  N  HEALTH MAINTENANCE: Social History   Tobacco Use   Smoking status: Former    Packs/day: 0.50    Years: 40.00    Total pack years: 20.00    Types: Cigarettes    Quit date: 10/15/2022    Years since quitting: 0.0   Smokeless tobacco: Former    Quit date: 10/15/2022  Substance Use Topics   Alcohol use: No   Drug use: No     Colonoscopy:  PAP:  Bone density:  Lipid panel:  No Known Allergies  Current Outpatient Medications  Medication Sig Dispense Refill   albuterol (VENTOLIN HFA) 108 (90 Base) MCG/ACT inhaler Inhale 2 puffs into the lungs every 6 (six) hours as needed for wheezing or shortness of breath.  amLODipine (NORVASC) 10 MG tablet Take 1 tablet (10 mg total) by mouth daily. 30 tablet 0   benztropine (COGENTIN) 2 MG tablet Take 2 mg by mouth 2 (two) times daily.     bisacodyl (DULCOLAX) 5 MG EC tablet Take 1 tablet (5 mg total) by mouth daily as needed for moderate constipation. 30 tablet 0   dexamethasone (DECADRON) 4 MG tablet Take 1 tablet (4 mg total) by mouth daily. 30 tablet 1   docusate sodium (COLACE) 100 MG capsule Take 1 capsule (100 mg total) by mouth 2 (two) times daily as  needed for mild constipation. 10 capsule 0   fluPHENAZine (PROLIXIN) 5 MG tablet Take 2.5 mg by mouth daily.     guaiFENesin (MUCINEX) 600 MG 12 hr tablet Take 1 tablet (600 mg total) by mouth 2 (two) times daily as needed for cough. 30 tablet 0   HYDROcodone-acetaminophen (NORCO/VICODIN) 5-325 MG tablet Take 1-2 tablets by mouth every 6 (six) hours as needed for moderate pain. 30 tablet 0   ipratropium-albuterol (DUONEB) 0.5-2.5 (3) MG/3ML SOLN Take 3 mLs by nebulization every 4 (four) hours as needed. 360 mL 0   lidocaine-prilocaine (EMLA) cream Apply to affected area once 30 g 3   meloxicam (MOBIC) 15 MG tablet Take 15 mg by mouth daily.     Multiple Vitamin (MULTI-VITAMIN) tablet Take 1 tablet by mouth daily.     propranolol (INDERAL) 10 MG tablet Take 0.5 tablets (5 mg total) by mouth 2 (two) times daily. 60 tablet 0   simvastatin (ZOCOR) 20 MG tablet Take 20 mg by mouth at bedtime.     sodium chloride 1 g tablet Take 1 tablet (1 g total) by mouth 3 (three) times daily. 30 tablet 2   Vitamin E 400 units TABS Take 400 Units by mouth daily.     nicotine (NICODERM CQ - DOSED IN MG/24 HOURS) 21 mg/24hr patch Place 21 mg onto the skin daily. (Patient not taking: Reported on 11/11/2022)     potassium chloride (KLOR-CON M) 10 MEQ tablet Take 1 tablet (10 mEq total) by mouth 2 (two) times daily for 5 days. 10 tablet 0   No current facility-administered medications for this visit.    OBJECTIVE: Vitals:   11/19/22 0857  BP: 115/81  Pulse: 78  Resp: 16  Temp: (!) 96.2 F (35.7 C)  SpO2: 99%     Body mass index is 23.78 kg/m.    ECOG FS:1 - Symptomatic but completely ambulatory  General: Well-developed, well-nourished, no acute distress. Eyes: Pink conjunctiva, anicteric sclera. HEENT: Normocephalic, moist mucous membranes. Lungs: No audible wheezing or coughing. Heart: Regular rate and rhythm. Abdomen: Soft, nontender, no obvious distention. Musculoskeletal: No edema, cyanosis, or  clubbing. Neuro: Alert, answering all questions appropriately. Cranial nerves grossly intact. Skin: No rashes or petechiae noted. Psych: Normal affect.  LAB RESULTS:  Lab Results  Component Value Date   NA 130 (L) 11/19/2022   K 3.4 (L) 11/19/2022   CL 92 (L) 11/19/2022   CO2 30 11/19/2022   GLUCOSE 85 11/19/2022   BUN 19 11/19/2022   CREATININE 0.61 11/19/2022   CALCIUM 8.5 (L) 11/19/2022   PROT 6.2 (L) 11/19/2022   ALBUMIN 3.3 (L) 11/19/2022   AST 23 11/19/2022   ALT 22 11/19/2022   ALKPHOS 75 11/19/2022   BILITOT 0.8 11/19/2022   GFRNONAA >60 11/19/2022    Lab Results  Component Value Date   WBC 11.5 (H) 11/19/2022   NEUTROABS 10.3 (H) 11/19/2022  HGB 12.9 (L) 11/19/2022   HCT 38.1 (L) 11/19/2022   MCV 81.1 11/19/2022   PLT 218 11/19/2022     STUDIES: CT HEAD WO CONTRAST (5MM)  Result Date: 11/12/2022 CLINICAL DATA:  Altered mental status EXAM: CT HEAD WITHOUT CONTRAST TECHNIQUE: Contiguous axial images were obtained from the base of the skull through the vertex without intravenous contrast. RADIATION DOSE REDUCTION: This exam was performed according to the departmental dose-optimization program which includes automated exposure control, adjustment of the mA and/or kV according to patient size and/or use of iterative reconstruction technique. COMPARISON:  CT brain 10/11/2022 FINDINGS: Brain: No acute territorial infarction, hemorrhage or intracranial mass. The ventricles are nonenlarged. Vascular: No hyperdense vessels.  Carotid vascular calcification Skull: Normal. Negative for fracture or focal lesion. Sinuses/Orbits: No acute finding. Other: None IMPRESSION: Negative non contrasted CT appearance of the brain. Electronically Signed   By: Donavan Foil M.D.   On: 11/12/2022 20:48   CT Angio Chest PE W and/or Wo Contrast  Result Date: 11/12/2022 CLINICAL DATA:  Syncope altered mental status EXAM: CT ANGIOGRAPHY CHEST WITH CONTRAST TECHNIQUE: Multidetector CT imaging of  the chest was performed using the standard protocol during bolus administration of intravenous contrast. Multiplanar CT image reconstructions and MIPs were obtained to evaluate the vascular anatomy. RADIATION DOSE REDUCTION: This exam was performed according to the departmental dose-optimization program which includes automated exposure control, adjustment of the mA and/or kV according to patient size and/or use of iterative reconstruction technique. CONTRAST:  119m OMNIPAQUE IOHEXOL 350 MG/ML SOLN COMPARISON:  Chest x-ray 11/11/2022, PET CT 11/04/2022, chest CT 10/11/2022, FINDINGS: Cardiovascular: Satisfactory opacification of the pulmonary arteries to the segmental level. No evidence of pulmonary embolism. Nonaneurysmal aorta. Normal cardiac size. No pericardial effusion. Mediastinum/Nodes: Midline trachea. No thyroid mass. Interval decrease in previously noted left upper lobe consolidative focus with residual linear density in the region, series 6, image 66. Left lower lobe describes spiculated focus is also decreased compared to the prior chest CT without measurable mass in the region, series 6, image 45. Interval decrease in left AP window/hilar mass compared to the prior chest CT with residual soft tissue density. 10 mm high prevascular node, series 4, image 38, previously this measured 5 mm on interval PET CT. No new or enlarging lymph nodes. Esophagus within normal limits. Lungs/Pleura: Emphysema. No acute airspace disease, pleural effusion, or pneumothorax. Upper Abdomen: No acute finding. Hypodense liver lesions without corresponding finding on the PET CT from 11/04/2022. For example right hepatic lobe 1.7 cm lesion, series 4, image 152. 1.7 cm hypodense lesion in the left anterior hepatic dome, series 4, image 112. 1.6 cm right adrenal nodule, hypermetabolic on previous PET CT. Musculoskeletal: Subtle lucent lesions within the spine, corresponding to history of metastatic disease, this is most evident  involving right aspect of T12 vertebral body where there is osseous destruction and soft tissue mass measuring approximately 2.9 x 2.5 cm on series 4, image 128. Review of the MIP images confirms the above findings. IMPRESSION: 1. Negative for acute pulmonary embolus. 2. Emphysema. Interval decrease in previously noted left AP window/hilar mass and or nodes compared to the prior chest CT. Decreased left lower lobe pulmonary nodule and consolidative focus in the left upper lobe compared to the prior CT. 3. 10 mm high prevascular lymph node, previously this measured 5 mm on interval PET CT and was hypermetabolic consistent with metastatic disease. 4. Several hypodense liver lesions without corresponding finding on the PET CT from 11/04/2022, but appear new  or increased compared to the prior abdomen CT and concerning for metastatic disease. 5. 1.6 cm right adrenal nodule, hypermetabolic on previous PET CT, suspicious for metastatic disease. 6. Findings consistent with skeletal metastatic disease. Emphysema (ICD10-J43.9). Electronically Signed   By: Donavan Foil M.D.   On: 11/12/2022 20:45   DG Chest 2 View  Result Date: 11/11/2022 CLINICAL DATA:  Small cell lung carcinoma, shortness of breath, cough and wheezing. EXAM: CHEST - 2 VIEW COMPARISON:  10/11/2022 FINDINGS: Interval right jugular Port-A-Cath placement with the catheter tip in the lower SVC. The heart size and mediastinal contours are within normal limits. Decreased prominence soft tissue in the left hilar region likely consistent with response to treatment of known small cell lung carcinoma centered in the left superior hilar region and extending throughout the hilum and into the mediastinum by prior imaging. No pulmonary edema, pleural fluid or pneumothorax. The visualized skeletal structures are unremarkable. IMPRESSION: Decreased prominence of soft tissue in the left hilar region likely consistent with response to treatment of known small cell lung  carcinoma centered in the left superior hilar region and extending into the mediastinum. Electronically Signed   By: Aletta Edouard M.D.   On: 11/11/2022 15:54   IR IMAGING GUIDED PORT INSERTION  Result Date: 11/04/2022 INDICATION: IV access for chemotherapy EXAM: Chest port placement using ultrasound and fluoroscopic guidance MEDICATIONS: Documented in the EMR ANESTHESIA/SEDATION: Moderate (conscious) sedation was employed during this procedure. A total of Versed 1.5 mg and Fentanyl 100 mcg was administered intravenously. Moderate Sedation Time: 22 minutes. The patient's level of consciousness and vital signs were monitored continuously by radiology nursing throughout the procedure under my direct supervision. FLUOROSCOPY TIME:  Fluoroscopy Time: 0.4 minutes (1 mGy) COMPLICATIONS: None immediate. PROCEDURE: Informed written consent was obtained from the patient after a thorough discussion of the procedural risks, benefits and alternatives. All questions were addressed. Maximal Sterile Barrier Technique was utilized including caps, mask, sterile gowns, sterile gloves, sterile drape, hand hygiene and skin antiseptic. A timeout was performed prior to the initiation of the procedure. The patient was placed supine on the exam table. The right neck and chest was prepped and draped in the standard sterile fashion. A preliminary ultrasound of the right neck was performed and demonstrates a patent right internal jugular vein. A permanent ultrasound image was stored in the electronic medical record. The overlying skin was anesthetized with 1% Lidocaine. Using ultrasound guidance, access was obtained into the right internal jugular vein using a 21 gauge micropuncture set. A wire was advanced into the SVC, a short incision was made at the puncture site, and serial dilatation performed. Next, in an ipsilateral infraclavicular location, an incision was made at the site of the subcutaneous reservoir. Blunt dissection was  used to open a pocket to contain the reservoir. A subcutaneous tunnel was then created from the port site to the puncture site. A(n) 8 Fr single lumen catheter was advanced through the tunnel. The catheter was attached to the port and this was placed in the subcutaneous pocket. Under fluoroscopic guidance, a peel away sheath was placed, and the catheter was trimmed to the appropriate length and was advanced into the central veins. The catheter length is 25 cm. The tip of the catheter lies near the superior cavoatrial junction. The port flushes and aspirates appropriately. The port was flushed and locked with heparinized saline. The port pocket was closed in 2 layers using 3-0 and 4-0 Vicryl/absorbable suture. Dermabond was also applied to both incisions. The  patient tolerated the procedure well and was transferred to recovery in stable condition. IMPRESSION: Successful placement of a right-sided chest port via the right internal jugular vein. The port is ready for immediate use. Electronically Signed   By: Albin Felling M.D.   On: 11/04/2022 16:25   NM PET Image Initial (PI) Skull Base To Thigh  Result Date: 11/04/2022 CLINICAL DATA:  Initial treatment strategy for small-cell lung cancer. EXAM: NUCLEAR MEDICINE PET SKULL BASE TO THIGH TECHNIQUE: 8.5 mCi F-18 FDG was injected intravenously. Full-ring PET imaging was performed from the skull base to thigh after the radiotracer. CT data was obtained and used for attenuation correction and anatomic localization. Fasting blood glucose: 100 mg/dl COMPARISON:  10/11/2022 abdominopelvic CT.  Chest CT 10/11/2022 FINDINGS: Mediastinal blood pool activity: SUV max 1.1 Liver activity: SUV max NA NECK: No areas of abnormal hypermetabolism. Incidental CT findings: No gross abnormality identified. Degradation secondary to patient marked kyphosis. CHEST: A high left mediastinal/prevascular node measures 5 mm and a S.U.V. max of 2.4 on 58/2. Hypermetabolism corresponding to the  dominant left suprahilar/AP window mass on recent CT. Example at a S.U.V. max of 3.6 on 67/2. No pulmonary parenchymal hypermetabolism identified. Incidental CT findings: Centrilobular emphysema. Right lower lobe scarring. Interstitial thickening within the left upper lobe is likely due to postobstructive pneumonitis. ABDOMEN/PELVIS: No abdominopelvic nodal hypermetabolism. Right adrenal nodule measures 1.3 cm, 37 HU, and a S.U.V. max of 3.2. New since 2010. Incidental CT findings: Low-density bilateral renal lesions of maximally 3.2 cm are likely cysts . In the absence of clinically indicated signs/symptoms require(s) no independent follow-up. Right-sided dependent bladder stones of up to 4 mm. Mild prostatomegaly. SKELETON: Relatively diffuse osseous metastasis. Example L5 vertebral body at a S.U.V. max of 7.5. Posterior left iliac at a S.U.V. max of 7.7 on 175/2. Incidental CT findings: Remote right rib fractures. IMPRESSION: 1. Hypermetabolic thoracic nodes, including the dominant left suprahilar/AP window nodal mass. 2. Diffuse osseous metastasis. 3. Hypermetabolic right adrenal nodule, suspicious for metastatic disease. 4. Incidental findings, including: Prostatomegaly with bladder calculi. Emphysema (ICD10-J43.9). Electronically Signed   By: Abigail Miyamoto M.D.   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  Biopsy confirmed small cell lung carcinoma.  Patient could not tolerate MRI, but CT of the brain on October 11, 2022 is negative.  Patient has completed palliative XRT given his symptoms and encasement of pulmonary vessels.  PET scan results from November 04, 2022 reviewed independently and report as  above with widespread bony metastasis.  He received cycle 1, day 1 of cisplatin, etoposide, and Tecentriq last week and tolerated treatment relatively well.  Return to clinic in 2 weeks for further evaluation and consideration of cycle 2, day 1.  Patient will receive 4 cycles of treatment and then will reimage.  Cycle 5 and beyond will be maintenance Tecentriq.  Shortness of breath.  Chronic and unchanged.  Continue supplemental oxygen as needed.  Bone lesions: Continue Zometa with odd-numbered cycles. Hyponatremia: Sodium improved to 130.  Monitor.  Continue salt tablets as prescribed. Hypokalemia: Chronic and unchanged.  Continue oral potassium supplementation. Pain: Improved.  Patient has been instructed to take his hydrocodone  as prescribed.  Appreciate palliative care input. Leukocytosis: Likely reactive.  Patient also received Udenyca on November 18, 2022.   Patient expressed understanding and was in agreement with this plan. He also understands that He can call clinic at any time with any questions, concerns, or complaints.    Cancer Staging  Small cell lung cancer ([place]) Staging form: Lung, AJCC 8th Edition - Clinical: Stage IVA (cT4, cN2, cM1a) - Signed by Lloyd Huger, MD on 10/29/2022   Lloyd Huger, MD   11/19/2022 12:39 PM

## 2022-11-21 LAB — T4: T4, Total: 7.2 ug/dL (ref 4.5–12.0)

## 2022-11-28 ENCOUNTER — Encounter: Payer: Self-pay | Admitting: Radiation Oncology

## 2022-11-28 ENCOUNTER — Ambulatory Visit
Admission: RE | Admit: 2022-11-28 | Discharge: 2022-11-28 | Disposition: A | Discharge status: Home / Self Care | Payer: Medicare Other | Source: Ambulatory Visit | Attending: Radiation Oncology | Admitting: Radiation Oncology

## 2022-11-28 VITALS — BP 108/66 | HR 73 | Temp 96.6°F | Resp 18 | Ht 69.0 in

## 2022-11-28 DIAGNOSIS — C787 Secondary malignant neoplasm of liver and intrahepatic bile duct: Secondary | ICD-10-CM | POA: Insufficient documentation

## 2022-11-28 DIAGNOSIS — C3492 Malignant neoplasm of unspecified part of left bronchus or lung: Secondary | ICD-10-CM | POA: Insufficient documentation

## 2022-11-28 DIAGNOSIS — R918 Other nonspecific abnormal finding of lung field: Secondary | ICD-10-CM | POA: Diagnosis present

## 2022-11-28 DIAGNOSIS — Z923 Personal history of irradiation: Secondary | ICD-10-CM | POA: Insufficient documentation

## 2022-11-28 NOTE — Progress Notes (Signed)
Radiation Oncology Follow up Note  Name: Trevor Montgomery   Date:   11/28/2022 MRN:  [id number] DOB: [birth year]    This 62 y.o. male presents to the clinic today for 2-week follow-up status post palliative radiation therapy to his left lung for stage IV small cell lung cancer.  REFERRING PROVIDER: Tracie Harrier, MD  HPI: Patient is a 62 year old male now at 2 weeks having completed palliative radiation therapy to his left lung for stage IV small cell lung cancer.Marland Kitchen  He is being treated with cisplatin etoposide and Tecentriq which she is tolerated treatments well.  He states his breathing is stable no hemoptysis chest tightness or dysphagia.  Does have some pain in his left shoulder although I reviewed his films nothing metastatic in that region.  He does have widespread metastatic disease including the liver.  COMPLICATIONS OF TREATMENT: none  FOLLOW UP COMPLIANCE: keeps appointments   PHYSICAL EXAM:  BP 108/66   Pulse 73   Temp (!) 96.6 F (35.9 C)   Resp 18   Ht '5\' 9"'$  (1.753 m)   SpO2 100%   PF (!) 4 L/min   BMI 23.78 kg/m  Wheelchair-bound frail male in NAD.  Range of motion his left upper extremity does not elicit pain.  Well-developed well-nourished patient in NAD. HEENT reveals PERLA, EOMI, discs not visualized.  Oral cavity is clear. No oral mucosal lesions are identified. Neck is clear without evidence of cervical or supraclavicular adenopathy. Lungs are clear to A&P. Cardiac examination is essentially unremarkable with regular rate and rhythm without murmur rub or thrill. Abdomen is benign with no organomegaly or masses noted. Motor sensory and DTR levels are equal and symmetric in the upper and lower extremities. Cranial nerves II through XII are grossly intact. Proprioception is intact. No peripheral adenopathy or edema is identified. No motor or sensory levels are noted. Crude visual fields are within normal range.  RADIOLOGY RESULTS: PET scan reviewed compatible with above-stated  findings showed no evidence of disease in his left shoulder.  PLAN: At this time I will turn follow-up care over to medical oncology.  I be happy to reevaluate the patient in time should further palliative treatment be indicated.  He continues under treatment with medical oncology.  Patient and sister know to call at anytime with any concerns.  I would like to take this opportunity to thank you for allowing me to participate in the care of your patient.Noreene Filbert, MD

## 2022-12-03 ENCOUNTER — Ambulatory Visit: Admitting: Oncology

## 2022-12-03 ENCOUNTER — Ambulatory Visit

## 2022-12-03 ENCOUNTER — Other Ambulatory Visit

## 2022-12-03 MED FILL — Fosaprepitant Dimeglumine For IV Infusion 150 MG (Base Eq): INTRAVENOUS | Qty: 5 | Status: AC

## 2022-12-03 MED FILL — Dexamethasone Sodium Phosphate Inj 100 MG/10ML: INTRAMUSCULAR | Qty: 1 | Status: AC

## 2022-12-04 ENCOUNTER — Inpatient Hospital Stay (HOSPITAL_BASED_OUTPATIENT_CLINIC_OR_DEPARTMENT_OTHER): Payer: Medicare Other | Admitting: Oncology

## 2022-12-04 ENCOUNTER — Ambulatory Visit

## 2022-12-04 ENCOUNTER — Inpatient Hospital Stay: Payer: Medicare Other

## 2022-12-04 ENCOUNTER — Inpatient Hospital Stay: Payer: Medicare Other | Attending: Oncology

## 2022-12-04 ENCOUNTER — Encounter: Payer: Self-pay | Admitting: Oncology

## 2022-12-04 ENCOUNTER — Encounter: Payer: Self-pay | Admitting: *Deleted

## 2022-12-04 DIAGNOSIS — D72829 Elevated white blood cell count, unspecified: Secondary | ICD-10-CM | POA: Diagnosis not present

## 2022-12-04 DIAGNOSIS — C3490 Malignant neoplasm of unspecified part of unspecified bronchus or lung: Secondary | ICD-10-CM | POA: Diagnosis not present

## 2022-12-04 DIAGNOSIS — R0602 Shortness of breath: Secondary | ICD-10-CM | POA: Insufficient documentation

## 2022-12-04 DIAGNOSIS — Z5112 Encounter for antineoplastic immunotherapy: Secondary | ICD-10-CM | POA: Diagnosis not present

## 2022-12-04 DIAGNOSIS — Z5111 Encounter for antineoplastic chemotherapy: Secondary | ICD-10-CM | POA: Diagnosis present

## 2022-12-04 DIAGNOSIS — E876 Hypokalemia: Secondary | ICD-10-CM | POA: Diagnosis not present

## 2022-12-04 DIAGNOSIS — C7951 Secondary malignant neoplasm of bone: Secondary | ICD-10-CM | POA: Insufficient documentation

## 2022-12-04 DIAGNOSIS — R600 Localized edema: Secondary | ICD-10-CM | POA: Diagnosis not present

## 2022-12-04 DIAGNOSIS — Z87891 Personal history of nicotine dependence: Secondary | ICD-10-CM | POA: Insufficient documentation

## 2022-12-04 DIAGNOSIS — E871 Hypo-osmolality and hyponatremia: Secondary | ICD-10-CM | POA: Diagnosis not present

## 2022-12-04 DIAGNOSIS — I10 Essential (primary) hypertension: Secondary | ICD-10-CM | POA: Insufficient documentation

## 2022-12-04 DIAGNOSIS — C3432 Malignant neoplasm of lower lobe, left bronchus or lung: Secondary | ICD-10-CM | POA: Insufficient documentation

## 2022-12-04 LAB — COMPREHENSIVE METABOLIC PANEL
ALT: 21 U/L (ref 0–44)
AST: 28 U/L (ref 15–41)
Albumin: 3.2 g/dL — ABNORMAL LOW (ref 3.5–5.0)
Alkaline Phosphatase: 253 U/L — ABNORMAL HIGH (ref 38–126)
Anion gap: 10 (ref 5–15)
BUN: 9 mg/dL (ref 6–20)
CO2: 28 mmol/L (ref 22–32)
Calcium: 7.3 mg/dL — ABNORMAL LOW (ref 8.9–10.3)
Chloride: 84 mmol/L — ABNORMAL LOW (ref 98–111)
Creatinine, Ser: 0.4 mg/dL — ABNORMAL LOW (ref 0.61–1.24)
GFR, Estimated: >60 mL/min (ref >60)
Glucose, Bld: 85 mg/dL (ref 70–99)
Potassium: 3 mmol/L — ABNORMAL LOW (ref 3.5–5.1)
Sodium: 122 mmol/L — ABNORMAL LOW (ref 135–145)
Total Bilirubin: 0.4 mg/dL (ref 0.3–1.2)
Total Protein: 5.6 g/dL — ABNORMAL LOW (ref 6.5–8.1)

## 2022-12-04 LAB — CBC WITH DIFFERENTIAL/PLATELET
Abs Immature Granulocytes: 2.78 10*3/uL — ABNORMAL HIGH (ref 0.00–0.07)
Basophils Absolute: 0 10*3/uL (ref 0.0–0.1)
Basophils Relative: 0 %
Eosinophils Absolute: 0 10*3/uL (ref 0.0–0.5)
Eosinophils Relative: 0 %
HCT: 27.4 % — ABNORMAL LOW (ref 39.0–52.0)
Hemoglobin: 9.2 g/dL — ABNORMAL LOW (ref 13.0–17.0)
Immature Granulocytes: 18 %
Lymphocytes Relative: 8 %
Lymphs Abs: 1.2 10*3/uL (ref 0.7–4.0)
MCH: 28 pg (ref 26.0–34.0)
MCHC: 33.6 g/dL (ref 30.0–36.0)
MCV: 83.3 fL (ref 80.0–100.0)
Monocytes Absolute: 1.2 10*3/uL — ABNORMAL HIGH (ref 0.1–1.0)
Monocytes Relative: 8 %
Neutro Abs: 10.3 10*3/uL — ABNORMAL HIGH (ref 1.7–7.7)
Neutrophils Relative %: 66 %
Platelets: 312 10*3/uL (ref 150–400)
RBC: 3.29 MIL/uL — ABNORMAL LOW (ref 4.22–5.81)
RDW: 15.2 % (ref 11.5–15.5)
Smear Review: NORMAL
WBC: 15.5 10*3/uL — ABNORMAL HIGH (ref 4.0–10.5)
nRBC: 0.5 % — ABNORMAL HIGH (ref 0.0–0.2)

## 2022-12-04 MED ORDER — SODIUM CHLORIDE 0.9 % IV SOLN
150.0000 mg | Freq: Once | INTRAVENOUS | Status: AC
  Administered 2022-12-04: 150 mg via INTRAVENOUS
  Filled 2022-12-04: qty 150

## 2022-12-04 MED ORDER — SODIUM CHLORIDE 0.9 % IV SOLN: 100.0000 mg/m2 | Freq: Once | INTRAVENOUS | Status: DC

## 2022-12-04 MED ORDER — PALONOSETRON HCL INJECTION 0.25 MG/5ML
0.2500 mg | Freq: Once | INTRAVENOUS | Status: AC
  Administered 2022-12-04: 0.25 mg via INTRAVENOUS
  Filled 2022-12-04: qty 5

## 2022-12-04 MED ORDER — POTASSIUM CHLORIDE 20 MEQ/100ML IV SOLN
20.0000 meq | Freq: Once | INTRAVENOUS | Status: AC
  Administered 2022-12-04: 20 meq via INTRAVENOUS

## 2022-12-04 MED ORDER — SODIUM CHLORIDE 0.9 % IV SOLN
Freq: Once | INTRAVENOUS | Status: AC
  Filled 2022-12-04: qty 250

## 2022-12-04 MED ORDER — HEPARIN SOD (PORK) LOCK FLUSH 100 UNIT/ML IV SOLN
500.0000 [IU] | Freq: Once | INTRAVENOUS | Status: AC | PRN
  Administered 2022-12-04: 500 [IU]
  Filled 2022-12-04: qty 5

## 2022-12-04 MED ORDER — SODIUM CHLORIDE 0.9 % IV SOLN: 654.5000 mg | Freq: Once | INTRAVENOUS | Status: DC

## 2022-12-04 MED ORDER — SODIUM CHLORIDE 0.9 % IV SOLN
10.0000 mg | Freq: Once | INTRAVENOUS | Status: AC
  Administered 2022-12-04: 10 mg via INTRAVENOUS
  Filled 2022-12-04: qty 10

## 2022-12-04 MED ORDER — SODIUM CHLORIDE 0.9 % IV SOLN
1200.0000 mg | Freq: Once | INTRAVENOUS | Status: AC
  Administered 2022-12-04: 1200 mg via INTRAVENOUS
  Filled 2022-12-04: qty 20

## 2022-12-04 MED ORDER — SODIUM CHLORIDE 0.9 % IV SOLN
750.0000 mg | Freq: Once | INTRAVENOUS | Status: AC
  Administered 2022-12-04: 750 mg via INTRAVENOUS
  Filled 2022-12-04: qty 75

## 2022-12-04 MED ORDER — SODIUM CHLORIDE 0.9 % IV SOLN
100.0000 mg/m2 | Freq: Once | INTRAVENOUS | Status: AC
  Administered 2022-12-04: 200 mg via INTRAVENOUS
  Filled 2022-12-04: qty 10

## 2022-12-04 MED FILL — Dexamethasone Sodium Phosphate Inj 100 MG/10ML: INTRAMUSCULAR | Qty: 1 | Status: AC

## 2022-12-04 NOTE — Progress Notes (Signed)
Patient fell 2 times on his knee on yesterday and 2 weeks ago moving too fast missing steps.

## 2022-12-04 NOTE — Patient Instructions (Signed)
[facility]  Discharge Instructions: Thank you for choosing [facility] to provide your oncology and hematology care.  If you have a lab appointment with the [facility], please go directly to the [facility] and check in at the registration area.  Wear comfortable clothing and clothing appropriate for easy access to any Portacath or PICC line.   We strive to give you quality time with your provider. You may need to reschedule your appointment if you arrive late (15 or more minutes).  Arriving late affects you and other patients whose appointments are after yours.  Also, if you miss three or more appointments without notifying the office, you may be dismissed from the clinic at the provider's discretion.      For prescription refill requests, have your pharmacy contact our office and allow 72 hours for refills to be completed.    Today you received the following chemotherapy and/or immunotherapy agents TECENTRIQ, CARBOPLATIN, ETOPOSIDE, POTASSIUM      To help prevent nausea and vomiting after your treatment, we encourage you to take your nausea medication as directed.  BELOW ARE SYMPTOMS THAT SHOULD BE REPORTED IMMEDIATELY: *FEVER GREATER THAN 100.4 F (38 C) OR HIGHER *CHILLS OR SWEATING *NAUSEA AND VOMITING THAT IS NOT CONTROLLED WITH YOUR NAUSEA MEDICATION *UNUSUAL SHORTNESS OF BREATH *UNUSUAL BRUISING OR BLEEDING *URINARY PROBLEMS (pain or burning when urinating, or frequent urination) *BOWEL PROBLEMS (unusual diarrhea, constipation, pain near the anus) TENDERNESS IN MOUTH AND THROAT WITH OR WITHOUT PRESENCE OF ULCERS (sore throat, sores in mouth, or a toothache) UNUSUAL RASH, SWELLING OR PAIN  UNUSUAL VAGINAL DISCHARGE OR ITCHING   Items with * indicate a potential emergency and should be followed up as soon as possible or go to the Emergency Department if any problems should occur.  Please show the CHEMOTHERAPY ALERT CARD or  IMMUNOTHERAPY ALERT CARD at check-in to the Emergency Department and triage nurse.  Should you have questions after your visit or need to cancel or reschedule your appointment, please contact [facility]  [phone] and follow the prompts.  Office hours are 8:00 a.m. to 4:30 p.m. Monday - Friday. Please note that voicemails left after 4:00 p.m. may not be returned until the following business day.  We are closed weekends and major holidays. You have access to a nurse at all times for urgent questions. Please call the main number to the clinic [phone] and follow the prompts.  For any non-urgent questions, you may also contact your provider using MyChart. We now offer e-Visits for anyone 9 and older to request care online for non-urgent symptoms. For details visit [email or web address].   Also download the MyChart app! Go to the app store, search "MyChart", open the app, select [facility], and log in with your MyChart username and password.   Atezolizumab Injection What is this medication? ATEZOLIZUMAB (a te zoe LIZ ue mab) treats some types of cancer. It works by helping your immune system slow or stop the spread of cancer cells. It is a monoclonal antibody. This medicine may be used for other purposes; ask your health care provider or pharmacist if you have questions. COMMON BRAND NAME(S): Tecentriq What should I tell my care team before I take this medication? They need to know if you have any of these conditions: Allogeneic stem cell transplant (uses someone else's stem cells) Autoimmune diseases, such as Crohn disease, ulcerative colitis, lupus History of chest radiation Nervous system problems, such  as Guillain-Barre syndrome, myasthenia gravis Organ transplant An unusual or allergic reaction to atezolizumab, other medications, foods, dyes, or preservatives Pregnant or trying to get pregnant Breast-feeding How should I use this  medication? This medication is injected into a vein. It is given by your care team in a hospital or clinic setting. A special MedGuide will be given to you before each treatment. Be sure to read this information carefully each time. Talk to your care team about the use of this medication in children. While it may be prescribed for children as young as 2 years for selected conditions, precautions do apply. Overdosage: If you think you have taken too much of this medicine contact a poison control center or emergency room at once. NOTE: This medicine is only for you. Do not share this medicine with others. What if I miss a dose? Keep appointments for follow-up doses. It is important not to miss your dose. Call your care team if you are unable to keep an appointment. What may interact with this medication? Interactions have not been studied. This list may not describe all possible interactions. Give your health care provider a list of all the medicines, herbs, non-prescription drugs, or dietary supplements you use. Also tell them if you smoke, drink alcohol, or use illegal drugs. Some items may interact with your medicine. What should I watch for while using this medication? Your condition will be monitored carefully while you are receiving this medication. You may need blood work while taking this medication. This medication may cause serious skin reactions. They can happen weeks to months after starting the medication. Contact your care team right away if you notice fevers or flu-like symptoms with a rash. The rash may be red or purple and then turn into blisters or peeling of the skin. You may also notice a red rash with swelling of the face, lips, or lymph nodes in your neck or under your arms. Tell your care team right away if you have any change in your eyesight. Talk to your care team if you may be pregnant. Serious birth defects can occur if you take this medication during pregnancy and for 5  months after the last dose. You will need a negative pregnancy test before starting this medication. Contraception is recommended while taking this medication and for 5 months after the last dose. Your care team can help you find the option that works for you. Do not breastfeed while taking this medication and for at least 5 months after the last dose. What side effects may I notice from receiving this medication? Side effects that you should report to your doctor or health care professional as soon as possible: Allergic reactions--skin rash, itching, hives, swelling of the face, lips, tongue, or throat Dry cough, shortness of breath or trouble breathing Eye pain, redness, irritation, or discharge with blurry or decreased vision Heart muscle inflammation--unusual weakness or fatigue, shortness of breath, chest pain, fast or irregular heartbeat, dizziness, swelling of the ankles, feet, or hands Hormone gland problems--headache, sensitivity to [place], unusual weakness or fatigue, dizziness, fast or irregular heartbeat, increased sensitivity to cold or heat, excessive sweating, constipation, hair loss, increased thirst or amount of urine, tremors or shaking, irritability Infusion reactions--chest pain, shortness of breath or trouble breathing, feeling faint or lightheaded Kidney injury (glomerulonephritis)--decrease in the amount of urine, red or dark brown urine, foamy or bubbly urine, swelling of the ankles, hands, or feet Liver injury--right upper belly pain, loss of appetite, nausea, Arment-colored stool, dark  yellow or brown urine, yellowing skin or eyes, unusual weakness or fatigue Pain, tingling, or numbness in the hands or feet, muscle weakness, change in vision, confusion or trouble speaking, loss of balance or coordination, trouble walking, seizures Rash, fever, and swollen lymph nodes Redness, blistering, peeling, or loosening of the skin, including inside the mouth Sudden or severe stomach  pain, bloody diarrhea, fever, nausea, vomiting Side effects that usually do not require medical attention (report to your doctor or health care professional if they continue or are bothersome): Bone, joint, or muscle pain Diarrhea Fatigue Loss of appetite Nausea Skin rash This list may not describe all possible side effects. Call your doctor for medical advice about side effects. You may report side effects to FDA at 1-800-FDA-1088. Where should I keep my medication? This medication is given in a hospital or clinic. It will not be stored at home. NOTE: This sheet is a summary. It may not cover all possible information. If you have questions about this medicine, talk to your doctor, pharmacist, or health care provider.  2023 Elsevier/Gold Standard (2021-12-31 00:00:00)  Carboplatin Injection What is this medication? CARBOPLATIN (KAR boe pla tin) treats some types of cancer. It works by slowing down the growth of cancer cells. This medicine may be used for other purposes; ask your health care provider or pharmacist if you have questions. COMMON BRAND NAME(S): Paraplatin What should I tell my care team before I take this medication? They need to know if you have any of these conditions: Blood disorders Hearing problems Kidney disease Recent or ongoing radiation therapy An unusual or allergic reaction to carboplatin, cisplatin, other medications, foods, dyes, or preservatives Pregnant or trying to get pregnant Breast-feeding How should I use this medication? This medication is injected into a vein. It is given by your care team in a hospital or clinic setting. Talk to your care team about the use of this medication in children. Special care may be needed. Overdosage: If you think you have taken too much of this medicine contact a poison control center or emergency room at once. NOTE: This medicine is only for you. Do not share this medicine with others. What if I miss a dose? Keep  appointments for follow-up doses. It is important not to miss your dose. Call your care team if you are unable to keep an appointment. What may interact with this medication? Medications for seizures Some antibiotics, such as amikacin, gentamicin, neomycin, streptomycin, tobramycin Vaccines This list may not describe all possible interactions. Give your health care provider a list of all the medicines, herbs, non-prescription drugs, or dietary supplements you use. Also tell them if you smoke, drink alcohol, or use illegal drugs. Some items may interact with your medicine. What should I watch for while using this medication? Your condition will be monitored carefully while you are receiving this medication. You may need blood work while taking this medication. This medication may make you feel generally unwell. This is not uncommon, as chemotherapy can affect healthy cells as well as cancer cells. Report any side effects. Continue your course of treatment even though you feel ill unless your care team tells you to stop. In some cases, you may be given additional medications to help with side effects. Follow all directions for their use. This medication may increase your risk of getting an infection. Call your care team for advice if you get a fever, chills, sore throat, or other symptoms of a cold or flu. Do not treat yourself.  Try to avoid being around people who are sick. Avoid taking medications that contain aspirin, acetaminophen, ibuprofen, naproxen, or ketoprofen unless instructed by your care team. These medications may hide a fever. Be careful brushing or flossing your teeth or using a toothpick because you may get an infection or bleed more easily. If you have any dental work done, tell your dentist you are receiving this medication. Talk to your care team if you wish to become pregnant or think you might be pregnant. This medication can cause serious birth defects. Talk to your care team about  effective forms of contraception. Do not breast-feed while taking this medication. What side effects may I notice from receiving this medication? Side effects that you should report to your care team as soon as possible: Allergic reactions--skin rash, itching, hives, swelling of the face, lips, tongue, or throat Infection--fever, chills, cough, sore throat, wounds that don't heal, pain or trouble when passing urine, general feeling of discomfort or being unwell Low red blood cell level--unusual weakness or fatigue, dizziness, headache, trouble breathing Pain, tingling, or numbness in the hands or feet, muscle weakness, change in vision, confusion or trouble speaking, loss of balance or coordination, trouble walking, seizures Unusual bruising or bleeding Side effects that usually do not require medical attention (report to your care team if they continue or are bothersome): Hair loss Nausea Unusual weakness or fatigue Vomiting This list may not describe all possible side effects. Call your doctor for medical advice about side effects. You may report side effects to FDA at 1-800-FDA-1088. Where should I keep my medication? This medication is given in a hospital or clinic. It will not be stored at home. NOTE: This sheet is a summary. It may not cover all possible information. If you have questions about this medicine, talk to your doctor, pharmacist, or health care provider.  2023 Elsevier/Gold Standard (2007-10-31 00:00:00)  Etoposide Capsules What is this medication? ETOPOSIDE (e toe POE side) treats lung cancer. It works by slowing down the growth of cancer cells. This medicine may be used for other purposes; ask your health care provider or pharmacist if you have questions. COMMON BRAND NAME(S): VePesid What should I tell my care team before I take this medication? They need to know if you have any of these conditions: Infection Kidney disease Liver disease Low blood counts, such as  low white cell, platelet, red cell counts An unusual or allergic reaction to etoposide, other medications, foods, dyes, or preservatives Pregnant or trying to get pregnant Breastfeeding How should I use this medication? Take this medication by mouth with a glass of water. Take it as directed on the prescription label. Do not cut, crush, or chew this medication. Swallow the capsules whole. Do not take it more often than directed. Keep taking it unless your care team tells you to stop. Handling this medication may be harmful. Wear gloves while touching this medication or bottle. Talk to your care team about how to handle this medication. Special instructions may apply. Talk to your care team about the use of this medication in children. Special care may be needed. Overdosage: If you think you have taken too much of this medicine contact a poison control center or emergency room at once. NOTE: This medicine is only for you. Do not share this medicine with others. What if I miss a dose? If you miss a dose, take it as soon as you can. If it is almost time for your next dose, take only that  dose. Do not take double or extra doses. What may interact with this medication? Cyclosporine Warfarin This list may not describe all possible interactions. Give your health care provider a list of all the medicines, herbs, non-prescription drugs, or dietary supplements you use. Also tell them if you smoke, drink alcohol, or use illegal drugs. Some items may interact with your medicine. What should I watch for while using this medication? Your condition will be monitored carefully while you are receiving this medication. This medication may make you feel generally unwell. This is not uncommon as chemotherapy can affect healthy cells as well as cancer cells. Report any side effects. Continue your course of treatment even though you feel ill unless your care team tells you to stop. This medication may increase your  risk of getting an infection. Call your care team for advice if you get a fever, chills, sore throat, or other symptoms of a cold or flu. Do not treat yourself. Try to avoid being around people who are sick. This medication may increase your risk to bruise or bleed. Call your care team if you notice any unusual bleeding. Talk to your care team about your risk of cancer. You may be more at risk for certain types of cancers if you take this medication. Talk to your care team if you may be pregnant. Serious birth defects can occur if you take this medication during pregnancy and for 6 months after the last dose. You will need a negative pregnancy test before starting this medication. Contraception is recommended while taking this medication and for 6 months after the last dose. Your care team can help you find the option that works for you. If your partner can get pregnant, use a condom during sex while taking this medication and for 4 months after the last dose. Do not breastfeed while taking this medication. This medication may cause infertility. Talk to your care team if you are concerned about your fertility. What side effects may I notice from receiving this medication? Side effects that you should report to your care team as soon as possible: Allergic reactions--skin rash, itching, hives, swelling of the face, lips, tongue, or throat Infection--fever, chills, cough, sore throat, wounds that don't heal, pain or trouble when passing urine, general feeling of discomfort or being unwell Low red blood cell level--unusual weakness or fatigue, dizziness, headache, trouble breathing Unusual bruising or bleeding Side effects that usually do not require medical attention (report to your care team if they continue or are bothersome): Diarrhea Fatigue Hair loss Loss of appetite Nausea Pain, redness, or swelling with sores inside the mouth or throat Vomiting This list may not describe all possible side  effects. Call your doctor for medical advice about side effects. You may report side effects to FDA at 1-800-FDA-1088. Where should I keep my medication? Keep out of the reach of children and pets. Store in a refrigerator between 2 and 8 degrees C (36 and 46 degrees F). Do not freeze. Get rid any unused medication after the expiration date. To get rid of medications that are no longer needed or have expired: Take the medication to a medication take-back program. Check with your pharmacy or law enforcement to find a location. If you cannot return the medication, ask your pharmacist or care team how to get rid of this medication safely. NOTE: This sheet is a summary. It may not cover all possible information. If you have questions about this medicine, talk to your doctor, pharmacist, or health  care provider.  2023 Elsevier/Gold Standard (2022-01-30 00:00:00)   Potassium Chloride Injection What is this medication? POTASSIUM CHLORIDE (poe TASS i um KLOOR ide) prevents and treats low levels of potassium in your body. Potassium plays an important role in maintaining the health of your kidneys, heart, muscles, and nervous system. This medicine may be used for other purposes; ask your health care provider or pharmacist if you have questions. COMMON BRAND NAME(S): PROAMP What should I tell my care team before I take this medication? They need to know if you have any of these conditions: Addison disease Dehydration Diabetes (high blood sugar) Heart disease High levels of potassium in the blood Irregular heartbeat or rhythm Kidney disease Large areas of burned skin An unusual or allergic reaction to potassium, other medications, foods, dyes, or preservatives Pregnant or trying to get pregnant Breast-feeding How should I use this medication? This medication is injected into a vein. It is given in a hospital or clinic setting. Talk to your care team about the use of this medication in children.  Special care may be needed. Overdosage: If you think you have taken too much of this medicine contact a poison control center or emergency room at once. NOTE: This medicine is only for you. Do not share this medicine with others. What if I miss a dose? This does not apply. This medication is not for regular use. What may interact with this medication? Do not take this medication with any of the following: Certain diuretics, such as spironolactone, triamterene Eplerenone Sodium polystyrene sulfonate This medication may also interact with the following: Certain medications for blood pressure or heart disease, such as lisinopril, losartan, quinapril, valsartan Medications that lower your chance of fighting infection, such as cyclosporine, tacrolimus NSAIDs, medications for pain and inflammation, such as ibuprofen or naproxen Other potassium supplements Salt substitutes This list may not describe all possible interactions. Give your health care provider a list of all the medicines, herbs, non-prescription drugs, or dietary supplements you use. Also tell them if you smoke, drink alcohol, or use illegal drugs. Some items may interact with your medicine. What should I watch for while using this medication? Visit your care team for regular checks on your progress. Tell your care team if your symptoms do not start to get better or if they get worse. You may need blood work while you are taking this medication. Avoid salt substitutes unless you are told otherwise by your care team. What side effects may I notice from receiving this medication? Side effects that you should report to your care team as soon as possible: Allergic reactions--skin rash, itching, hives, swelling of the face, lips, tongue, or throat High potassium level--muscle weakness, fast or irregular heartbeat Side effects that usually do not require medical attention (report to your care team if they continue or are  bothersome): Diarrhea Nausea Stomach pain Vomiting This list may not describe all possible side effects. Call your doctor for medical advice about side effects. You may report side effects to FDA at 1-800-FDA-1088. Where should I keep my medication? This medication is given in a hospital or clinic. It will not be stored at home. NOTE: This sheet is a summary. It may not cover all possible information. If you have questions about this medicine, talk to your doctor, pharmacist, or health care provider.  2023 Elsevier/Gold Standard (2007-10-31 00:00:00)

## 2022-12-04 NOTE — Progress Notes (Signed)
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  INTERVAL HISTORY: Patient returns to clinic today for further evaluation and consideration of cycle 2, day 1 of cisplatin and etoposide.  He had several falls this past week, but sustained no injuries nor sought out medical attention.  He currently feels well and at his baseline.  He does not complain of pain today. He is tolerating XRT without significant side effects.  He continues to have chronic shortness of breath and cough.  He has no neurologic complaints.  He denies any recent fevers.  He has a good appetite and denies weight loss.  He has no chest pain or hemoptysis.  He has no nausea, vomiting, constipation, or diarrhea.  He has no urinary complaints.  Patient offers no further specific complaints today.  REVIEW OF SYSTEMS:   Review of Systems  Constitutional:  Positive for malaise/fatigue. Negative for fever and weight loss.  Respiratory:  Positive for cough and shortness of breath. Negative for hemoptysis.   Cardiovascular: Negative.  Negative for chest pain and leg swelling.  Gastrointestinal: Negative.  Negative for abdominal pain and nausea.  Genitourinary: Negative.  Negative for dysuria.  Musculoskeletal:  Positive for joint pain. Negative for back pain.  Skin: Negative.  Negative for rash.  Neurological: Negative.  Negative for dizziness, focal weakness, weakness and headaches.  Psychiatric/Behavioral: Negative.  The patient is not nervous/anxious.     As per HPI. Otherwise, a complete review of systems is negative.  PAST MEDICAL HISTORY: Past Medical History:  Diagnosis Date   Anxiety    Arthritis    Emphysema of lung ([place])    HTN  (hypertension)    Hyperlipemia    Schizoaffective disorder, bipolar type ([place])    Substance abuse ([place])     PAST SURGICAL HISTORY: Past Surgical History:  Procedure Laterality Date   FINGER SURGERY     IR IMAGING GUIDED PORT INSERTION  11/04/2022   VIDEO BRONCHOSCOPY WITH ENDOBRONCHIAL ULTRASOUND Left 10/11/2022   Procedure: VIDEO BRONCHOSCOPY WITH ENDOBRONCHIAL ULTRASOUND;  Surgeon: Armando Reichert, MD;  Location: [facility] ORS;  Service: Pulmonary;  Laterality: Left;    FAMILY HISTORY: Family History  Problem Relation Age of Onset   Stroke Father    Seizures Father    Stroke Sister    Cancer Maternal Grandmother     ADVANCED DIRECTIVES (Y/N):  N  HEALTH MAINTENANCE: Social History   Tobacco Use   Smoking status: Former    Packs/day: 0.50    Years: 40.00    Total pack years: 20.00    Types: Cigarettes    Quit date: 10/15/2022    Years since quitting: 0.1   Smokeless tobacco: Former    Quit date: 10/15/2022  Substance Use Topics   Alcohol use: No   Drug use: No     Colonoscopy:  PAP:  Bone density:  Lipid panel:  No Known Allergies  Current Outpatient Medications  Medication Sig Dispense Refill   albuterol (VENTOLIN HFA) 108 (90 Base) MCG/ACT inhaler Inhale 2 puffs into the lungs every 6 (six) hours as needed for wheezing or shortness of breath.     amLODipine (NORVASC) 10 MG tablet Take 1 tablet (10 mg total) by mouth daily. 30 tablet 0   benztropine (COGENTIN)  2 MG tablet Take 2 mg by mouth 2 (two) times daily.     bisacodyl (DULCOLAX) 5 MG EC tablet Take 1 tablet (5 mg total) by mouth daily as needed for moderate constipation. 30 tablet 0   dexamethasone (DECADRON) 4 MG tablet Take 1 tablet (4 mg total) by mouth daily. 30 tablet 1   docusate sodium (COLACE) 100 MG capsule Take 1 capsule (100 mg total) by mouth 2 (two) times daily as needed for mild constipation. 10 capsule 0   fluPHENAZine (PROLIXIN) 5 MG tablet Take 2.5 mg by mouth daily.     guaiFENesin  (MUCINEX) 600 MG 12 hr tablet Take 1 tablet (600 mg total) by mouth 2 (two) times daily as needed for cough. 30 tablet 0   HYDROcodone-acetaminophen (NORCO/VICODIN) 5-325 MG tablet Take 1-2 tablets by mouth every 6 (six) hours as needed for moderate pain. 30 tablet 0   ipratropium-albuterol (DUONEB) 0.5-2.5 (3) MG/3ML SOLN Take 3 mLs by nebulization every 4 (four) hours as needed. 360 mL 0   lidocaine-prilocaine (EMLA) cream Apply to affected area once 30 g 3   meloxicam (MOBIC) 15 MG tablet Take 15 mg by mouth daily.     Multiple Vitamin (MULTI-VITAMIN) tablet Take 1 tablet by mouth daily.     propranolol (INDERAL) 10 MG tablet Take 0.5 tablets (5 mg total) by mouth 2 (two) times daily. 60 tablet 0   simvastatin (ZOCOR) 20 MG tablet Take 20 mg by mouth at bedtime.     sodium chloride 1 g tablet Take 1 tablet (1 g total) by mouth 3 (three) times daily. 30 tablet 2   Vitamin E 400 units TABS Take 400 Units by mouth daily.     potassium chloride (KLOR-CON M) 10 MEQ tablet Take 1 tablet (10 mEq total) by mouth 2 (two) times daily for 5 days. 10 tablet 0   No current facility-administered medications for this visit.   Facility-Administered Medications Ordered in Other Visits  Medication Dose Route Frequency Provider Last Rate Last Admin   atezolizumab (TECENTRIQ) 1,200 mg in sodium chloride 0.9 % 250 mL chemo infusion  1,200 mg Intravenous Once Lloyd Huger, MD       CARBOplatin (PARAPLATIN) 650 mg in sodium chloride 0.9 % 250 mL chemo infusion  650 mg Intravenous Once Lloyd Huger, MD       etoposide (VEPESID) 200 mg in sodium chloride 0.9 % 500 mL chemo infusion  100 mg/m2 (Treatment Plan Recorded) Intravenous Once Lloyd Huger, MD       heparin lock flush 100 unit/mL  500 Units Intracatheter Once PRN Lloyd Huger, MD       palonosetron (ALOXI) injection 0.25 mg  0.25 mg Intravenous Once Lloyd Huger, MD       potassium chloride 20 mEq in 100 mL IVPB  20 mEq  Intravenous Once Lloyd Huger, MD       potassium chloride 20 mEq in 100 mL IVPB  20 mEq Intravenous Once Lloyd Huger, MD        OBJECTIVE: Vitals:   12/04/22 0913  BP: 121/72  Pulse: 79  Resp: 18  Temp: (!) 95.1 F (35.1 C)  SpO2: 100%     Body mass index is 29.22 kg/m.    ECOG FS:1 - Symptomatic but completely ambulatory  General: Well-developed, well-nourished, no acute distress.  Sitting in a wheelchair. Eyes: Pink conjunctiva, anicteric sclera. HEENT: Normocephalic, moist mucous membranes. Lungs: No audible wheezing or coughing. Heart: Regular  rate and rhythm. Abdomen: Soft, nontender, no obvious distention. Musculoskeletal: No edema, cyanosis, or clubbing. Neuro: Alert, answering all questions appropriately. Cranial nerves grossly intact. Skin: No rashes or petechiae noted. Psych: Normal affect.  LAB RESULTS:  Lab Results  Component Value Date   NA 122 (L) 12/04/2022   K 3.0 (L) 12/04/2022   CL 84 (L) 12/04/2022   CO2 28 12/04/2022   GLUCOSE 85 12/04/2022   BUN 9 12/04/2022   CREATININE 0.40 (L) 12/04/2022   CALCIUM 7.3 (L) 12/04/2022   PROT 5.6 (L) 12/04/2022   ALBUMIN 3.2 (L) 12/04/2022   AST 28 12/04/2022   ALT 21 12/04/2022   ALKPHOS 253 (H) 12/04/2022   BILITOT 0.4 12/04/2022   GFRNONAA >60 12/04/2022    Lab Results  Component Value Date   WBC 15.5 (H) 12/04/2022   NEUTROABS 10.3 (H) 12/04/2022   HGB 9.2 (L) 12/04/2022   HCT 27.4 (L) 12/04/2022   MCV 83.3 12/04/2022   PLT 312 12/04/2022     STUDIES: CT HEAD WO CONTRAST (5MM)  Result Date: 11/12/2022 CLINICAL DATA:  Altered mental status EXAM: CT HEAD WITHOUT CONTRAST TECHNIQUE: Contiguous axial images were obtained from the base of the skull through the vertex without intravenous contrast. RADIATION DOSE REDUCTION: This exam was performed according to the departmental dose-optimization program which includes automated exposure control, adjustment of the mA and/or kV according to  patient size and/or use of iterative reconstruction technique. COMPARISON:  CT brain 10/11/2022 FINDINGS: Brain: No acute territorial infarction, hemorrhage or intracranial mass. The ventricles are nonenlarged. Vascular: No hyperdense vessels.  Carotid vascular calcification Skull: Normal. Negative for fracture or focal lesion. Sinuses/Orbits: No acute finding. Other: None IMPRESSION: Negative non contrasted CT appearance of the brain. Electronically Signed   By: Donavan Foil M.D.   On: 11/12/2022 20:48   CT Angio Chest PE W and/or Wo Contrast  Result Date: 11/12/2022 CLINICAL DATA:  Syncope altered mental status EXAM: CT ANGIOGRAPHY CHEST WITH CONTRAST TECHNIQUE: Multidetector CT imaging of the chest was performed using the standard protocol during bolus administration of intravenous contrast. Multiplanar CT image reconstructions and MIPs were obtained to evaluate the vascular anatomy. RADIATION DOSE REDUCTION: This exam was performed according to the departmental dose-optimization program which includes automated exposure control, adjustment of the mA and/or kV according to patient size and/or use of iterative reconstruction technique. CONTRAST:  157m OMNIPAQUE IOHEXOL 350 MG/ML SOLN COMPARISON:  Chest x-ray 11/11/2022, PET CT 11/04/2022, chest CT 10/11/2022, FINDINGS: Cardiovascular: Satisfactory opacification of the pulmonary arteries to the segmental level. No evidence of pulmonary embolism. Nonaneurysmal aorta. Normal cardiac size. No pericardial effusion. Mediastinum/Nodes: Midline trachea. No thyroid mass. Interval decrease in previously noted left upper lobe consolidative focus with residual linear density in the region, series 6, image 66. Left lower lobe describes spiculated focus is also decreased compared to the prior chest CT without measurable mass in the region, series 6, image 45. Interval decrease in left AP window/hilar mass compared to the prior chest CT with residual soft tissue density.  10 mm high prevascular node, series 4, image 38, previously this measured 5 mm on interval PET CT. No new or enlarging lymph nodes. Esophagus within normal limits. Lungs/Pleura: Emphysema. No acute airspace disease, pleural effusion, or pneumothorax. Upper Abdomen: No acute finding. Hypodense liver lesions without corresponding finding on the PET CT from 11/04/2022. For example right hepatic lobe 1.7 cm lesion, series 4, image 152. 1.7 cm hypodense lesion in the left anterior hepatic dome, series 4, image  112. 1.6 cm right adrenal nodule, hypermetabolic on previous PET CT. Musculoskeletal: Subtle lucent lesions within the spine, corresponding to history of metastatic disease, this is most evident involving right aspect of T12 vertebral body where there is osseous destruction and soft tissue mass measuring approximately 2.9 x 2.5 cm on series 4, image 128. Review of the MIP images confirms the above findings. IMPRESSION: 1. Negative for acute pulmonary embolus. 2. Emphysema. Interval decrease in previously noted left AP window/hilar mass and or nodes compared to the prior chest CT. Decreased left lower lobe pulmonary nodule and consolidative focus in the left upper lobe compared to the prior CT. 3. 10 mm high prevascular lymph node, previously this measured 5 mm on interval PET CT and was hypermetabolic consistent with metastatic disease. 4. Several hypodense liver lesions without corresponding finding on the PET CT from 11/04/2022, but appear new or increased compared to the prior abdomen CT and concerning for metastatic disease. 5. 1.6 cm right adrenal nodule, hypermetabolic on previous PET CT, suspicious for metastatic disease. 6. Findings consistent with skeletal metastatic disease. Emphysema (ICD10-J43.9). Electronically Signed   By: Donavan Foil M.D.   On: 11/12/2022 20:45   DG Chest 2 View  Result Date: 11/11/2022 CLINICAL DATA:  Small cell lung carcinoma, shortness of breath, cough and wheezing. EXAM:  CHEST - 2 VIEW COMPARISON:  10/11/2022 FINDINGS: Interval right jugular Port-A-Cath placement with the catheter tip in the lower SVC. The heart size and mediastinal contours are within normal limits. Decreased prominence soft tissue in the left hilar region likely consistent with response to treatment of known small cell lung carcinoma centered in the left superior hilar region and extending throughout the hilum and into the mediastinum by prior imaging. No pulmonary edema, pleural fluid or pneumothorax. The visualized skeletal structures are unremarkable. IMPRESSION: Decreased prominence of soft tissue in the left hilar region likely consistent with response to treatment of known small cell lung carcinoma centered in the left superior hilar region and extending into the mediastinum. Electronically Signed   By: Aletta Edouard M.D.   On: 11/11/2022 15:54   IR IMAGING GUIDED PORT INSERTION  Result Date: 11/04/2022 INDICATION: IV access for chemotherapy EXAM: Chest port placement using ultrasound and fluoroscopic guidance MEDICATIONS: Documented in the EMR ANESTHESIA/SEDATION: Moderate (conscious) sedation was employed during this procedure. A total of Versed 1.5 mg and Fentanyl 100 mcg was administered intravenously. Moderate Sedation Time: 22 minutes. The patient's level of consciousness and vital signs were monitored continuously by radiology nursing throughout the procedure under my direct supervision. FLUOROSCOPY TIME:  Fluoroscopy Time: 0.4 minutes (1 mGy) COMPLICATIONS: None immediate. PROCEDURE: Informed written consent was obtained from the patient after a thorough discussion of the procedural risks, benefits and alternatives. All questions were addressed. Maximal Sterile Barrier Technique was utilized including caps, mask, sterile gowns, sterile gloves, sterile drape, hand hygiene and skin antiseptic. A timeout was performed prior to the initiation of the procedure. The patient was placed supine on the  exam table. The right neck and chest was prepped and draped in the standard sterile fashion. A preliminary ultrasound of the right neck was performed and demonstrates a patent right internal jugular vein. A permanent ultrasound image was stored in the electronic medical record. The overlying skin was anesthetized with 1% Lidocaine. Using ultrasound guidance, access was obtained into the right internal jugular vein using a 21 gauge micropuncture set. A wire was advanced into the SVC, a short incision was made at the puncture site, and serial  dilatation performed. Next, in an ipsilateral infraclavicular location, an incision was made at the site of the subcutaneous reservoir. Blunt dissection was used to open a pocket to contain the reservoir. A subcutaneous tunnel was then created from the port site to the puncture site. A(n) 8 Fr single lumen catheter was advanced through the tunnel. The catheter was attached to the port and this was placed in the subcutaneous pocket. Under fluoroscopic guidance, a peel away sheath was placed, and the catheter was trimmed to the appropriate length and was advanced into the central veins. The catheter length is 25 cm. The tip of the catheter lies near the superior cavoatrial junction. The port flushes and aspirates appropriately. The port was flushed and locked with heparinized saline. The port pocket was closed in 2 layers using 3-0 and 4-0 Vicryl/absorbable suture. Dermabond was also applied to both incisions. The patient tolerated the procedure well and was transferred to recovery in stable condition. IMPRESSION: Successful placement of a right-sided chest port via the right internal jugular vein. The port is ready for immediate use. Electronically Signed   By: Albin Felling M.D.   On: 11/04/2022 16:25   NM PET Image Initial (PI) Skull Base To Thigh  Result Date: 11/04/2022 CLINICAL DATA:  Initial treatment strategy for small-cell lung cancer. EXAM: NUCLEAR MEDICINE PET SKULL  BASE TO THIGH TECHNIQUE: 8.5 mCi F-18 FDG was injected intravenously. Full-ring PET imaging was performed from the skull base to thigh after the radiotracer. CT data was obtained and used for attenuation correction and anatomic localization. Fasting blood glucose: 100 mg/dl COMPARISON:  10/11/2022 abdominopelvic CT.  Chest CT 10/11/2022 FINDINGS: Mediastinal blood pool activity: SUV max 1.1 Liver activity: SUV max NA NECK: No areas of abnormal hypermetabolism. Incidental CT findings: No gross abnormality identified. Degradation secondary to patient marked kyphosis. CHEST: A high left mediastinal/prevascular node measures 5 mm and a S.U.V. max of 2.4 on 58/2. Hypermetabolism corresponding to the dominant left suprahilar/AP window mass on recent CT. Example at a S.U.V. max of 3.6 on 67/2. No pulmonary parenchymal hypermetabolism identified. Incidental CT findings: Centrilobular emphysema. Right lower lobe scarring. Interstitial thickening within the left upper lobe is likely due to postobstructive pneumonitis. ABDOMEN/PELVIS: No abdominopelvic nodal hypermetabolism. Right adrenal nodule measures 1.3 cm, 37 HU, and a S.U.V. max of 3.2. New since 2010. Incidental CT findings: Low-density bilateral renal lesions of maximally 3.2 cm are likely cysts . In the absence of clinically indicated signs/symptoms require(s) no independent follow-up. Right-sided dependent bladder stones of up to 4 mm. Mild prostatomegaly. SKELETON: Relatively diffuse osseous metastasis. Example L5 vertebral body at a S.U.V. max of 7.5. Posterior left iliac at a S.U.V. max of 7.7 on 175/2. Incidental CT findings: Remote right rib fractures. IMPRESSION: 1. Hypermetabolic thoracic nodes, including the dominant left suprahilar/AP window nodal mass. 2. Diffuse osseous metastasis. 3. Hypermetabolic right adrenal nodule, suspicious for metastatic disease. 4. Incidental findings, including: Prostatomegaly with bladder calculi. Emphysema (ICD10-J43.9).  Electronically Signed   By: Abigail Miyamoto M.D.   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  Biopsy confirmed small cell lung carcinoma.  Patient could not tolerate MRI, but CT of the brain  on October 11, 2022 is negative.  Patient has completed palliative XRT given his symptoms and encasement of pulmonary vessels.  PET scan results from November 04, 2022 reviewed independently and report as above with widespread bony metastasis.  Proceed with cycle 2, day 1 of cisplatin, etoposide, and Tecentriq.  Return to clinic in 1 and 2 days for etoposide only, 3 days for Udenyca.  Patient will then return to clinic in 3 weeks for further evaluation and consideration of cycle 3.  Patient will receive 4 cycles of treatment and then will reimage.  Cycle 5 and beyond will be maintenance Tecentriq.  Shortness of breath.  Chronic and unchanged.   Bone lesions: Continue Zometa with odd-numbered cycles. Hyponatremia: Sodium decreased to 122.  Patient admits to not taking his salt tablets appropriately.   Hypokalemia: Potassium is trended down although patient admits to not taking his oral supplementation appropriately.  Proceed with 40 mEq IV potassium today. Pain: Improved.  Patient has been instructed to take his hydrocodone as prescribed.  Appreciate palliative care input. Leukocytosis: Likely reactive.  Continue Udenyca as above.  Patient expressed understanding and was in agreement with this plan. He also understands that He can call clinic at any time with any questions, concerns, or complaints.    Cancer Staging  Small cell lung cancer ([place]) Staging form: Lung, AJCC 8th Edition - Clinical: Stage IVA (cT4,  cN2, cM1a) - Signed by Lloyd Huger, MD on 10/29/2022   Lloyd Huger, MD   12/04/2022 10:27 AM

## 2022-12-05 ENCOUNTER — Ambulatory Visit

## 2022-12-05 ENCOUNTER — Other Ambulatory Visit: Payer: Self-pay

## 2022-12-05 ENCOUNTER — Inpatient Hospital Stay: Payer: Medicare Other

## 2022-12-05 VITALS — BP 122/81 | HR 80 | Temp 96.7°F | Resp 16

## 2022-12-05 DIAGNOSIS — Z5111 Encounter for antineoplastic chemotherapy: Secondary | ICD-10-CM | POA: Diagnosis not present

## 2022-12-05 DIAGNOSIS — C3490 Malignant neoplasm of unspecified part of unspecified bronchus or lung: Secondary | ICD-10-CM

## 2022-12-05 MED ORDER — SODIUM CHLORIDE 0.9 % IV SOLN
10.0000 mg | Freq: Once | INTRAVENOUS | Status: AC
  Administered 2022-12-05: 10 mg via INTRAVENOUS
  Filled 2022-12-05: qty 10

## 2022-12-05 MED ORDER — SODIUM CHLORIDE 0.9 % IV SOLN
100.0000 mg/m2 | Freq: Once | INTRAVENOUS | Status: AC
  Administered 2022-12-05: 200 mg via INTRAVENOUS
  Filled 2022-12-05: qty 10

## 2022-12-05 MED ORDER — POTASSIUM CHLORIDE CRYS ER 20 MEQ PO TBCR: 20.0000 meq | EXTENDED_RELEASE_TABLET | Freq: Two times a day (BID) | ORAL | 1 refills | Status: DC

## 2022-12-05 MED ORDER — HEPARIN SOD (PORK) LOCK FLUSH 100 UNIT/ML IV SOLN
500.0000 [IU] | Freq: Once | INTRAVENOUS | Status: AC | PRN
  Administered 2022-12-05: 500 [IU]
  Filled 2022-12-05: qty 5

## 2022-12-05 MED ORDER — SODIUM CHLORIDE 0.9% FLUSH
10.0000 mL | INTRAVENOUS | Status: DC | PRN
  Administered 2022-12-05: 10 mL
  Filled 2022-12-05: qty 10

## 2022-12-05 MED ORDER — SODIUM CHLORIDE 0.9 % IV SOLN
Freq: Once | INTRAVENOUS | Status: AC
  Filled 2022-12-05: qty 250

## 2022-12-05 MED FILL — Dexamethasone Sodium Phosphate Inj 100 MG/10ML: INTRAMUSCULAR | Qty: 1 | Status: AC

## 2022-12-05 NOTE — Patient Instructions (Signed)
[facility]  Discharge Instructions: Thank you for choosing [facility] to provide your oncology and hematology care.  If you have a lab appointment with the Cancer Center, please go directly to the Cancer Center and check in at the registration area.  Wear comfortable clothing and clothing appropriate for easy access to any Portacath or PICC line.   We strive to give you quality time with your provider. You may need to reschedule your appointment if you arrive late (15 or more minutes).  Arriving late affects you and other patients whose appointments are after yours.  Also, if you miss three or more appointments without notifying the office, you may be dismissed from the clinic at the provider's discretion.      For prescription refill requests, have your pharmacy contact our office and allow 72 hours for refills to be completed.    Today you received the following chemotherapy and/or immunotherapy agents Etoposide      To help prevent nausea and vomiting after your treatment, we encourage you to take your nausea medication as directed.  BELOW ARE SYMPTOMS THAT SHOULD BE REPORTED IMMEDIATELY: *FEVER GREATER THAN 100.4 F (38 C) OR HIGHER *CHILLS OR SWEATING *NAUSEA AND VOMITING THAT IS NOT CONTROLLED WITH YOUR NAUSEA MEDICATION *UNUSUAL SHORTNESS OF BREATH *UNUSUAL BRUISING OR BLEEDING *URINARY PROBLEMS (pain or burning when urinating, or frequent urination) *BOWEL PROBLEMS (unusual diarrhea, constipation, pain near the anus) TENDERNESS IN MOUTH AND THROAT WITH OR WITHOUT PRESENCE OF ULCERS (sore throat, sores in mouth, or a toothache) UNUSUAL RASH, SWELLING OR PAIN  UNUSUAL VAGINAL DISCHARGE OR ITCHING   Items with * indicate a potential emergency and should be followed up as soon as possible or go to the Emergency Department if any problems should occur.  Please show the CHEMOTHERAPY ALERT CARD or IMMUNOTHERAPY ALERT CARD at check-in to  the Emergency Department and triage nurse.  Should you have questions after your visit or need to cancel or reschedule your appointment, please contact [facility]  [phone] and follow the prompts.  Office hours are 8:00 a.m. to 4:30 p.m. Monday - Friday. Please note that voicemails left after 4:00 p.m. may not be returned until the following business day.  We are closed weekends and major holidays. You have access to a nurse at all times for urgent questions. Please call the main number to the clinic [phone] and follow the prompts.  For any non-urgent questions, you may also contact your provider using MyChart. We now offer e-Visits for anyone 18 and older to request care online for non-urgent symptoms. For details visit [email or web address].   Also download the MyChart app! Go to the app store, search "MyChart", open the app, select [facility], and log in with your MyChart username and password.    

## 2022-12-06 ENCOUNTER — Ambulatory Visit

## 2022-12-06 ENCOUNTER — Inpatient Hospital Stay: Payer: Medicare Other

## 2022-12-06 VITALS — BP 107/69 | HR 79 | Temp 96.5°F | Resp 18

## 2022-12-06 DIAGNOSIS — Z5111 Encounter for antineoplastic chemotherapy: Secondary | ICD-10-CM | POA: Diagnosis not present

## 2022-12-06 DIAGNOSIS — C3490 Malignant neoplasm of unspecified part of unspecified bronchus or lung: Secondary | ICD-10-CM

## 2022-12-06 MED ORDER — SODIUM CHLORIDE 0.9 % IV SOLN
Freq: Once | INTRAVENOUS | Status: AC
  Filled 2022-12-06: qty 250

## 2022-12-06 MED ORDER — SODIUM CHLORIDE 0.9 % IV SOLN
10.0000 mg | Freq: Once | INTRAVENOUS | Status: AC
  Administered 2022-12-06: 10 mg via INTRAVENOUS
  Filled 2022-12-06: qty 10

## 2022-12-06 MED ORDER — SODIUM CHLORIDE 0.9% FLUSH
10.0000 mL | INTRAVENOUS | Status: DC | PRN
  Filled 2022-12-06: qty 10

## 2022-12-06 MED ORDER — HEPARIN SOD (PORK) LOCK FLUSH 100 UNIT/ML IV SOLN
500.0000 [IU] | Freq: Once | INTRAVENOUS | Status: DC | PRN
  Filled 2022-12-06: qty 5

## 2022-12-06 MED ORDER — SODIUM CHLORIDE 0.9 % IV SOLN
100.0000 mg/m2 | Freq: Once | INTRAVENOUS | Status: AC
  Administered 2022-12-06: 200 mg via INTRAVENOUS
  Filled 2022-12-06: qty 10

## 2022-12-06 NOTE — Patient Instructions (Signed)
[facility] [place] [facility]  Discharge Instructions: Thank you for choosing [facility] to provide your oncology and hematology care.  If you have a lab appointment with the Cancer Center, please go directly to the Cancer Center and check in at the registration area.  Wear comfortable clothing and clothing appropriate for easy access to any Portacath or PICC line.   We strive to give you quality time with your provider. You may need to reschedule your appointment if you arrive late (15 or more minutes).  Arriving late affects you and other patients whose appointments are after yours.  Also, if you miss three or more appointments without notifying the office, you may be dismissed from the clinic at the provider's discretion.      For prescription refill requests, have your pharmacy contact our office and allow 72 hours for refills to be completed.    Today you received the following chemotherapy and/or immunotherapy agents: etoposide     To help prevent nausea and vomiting after your treatment, we encourage you to take your nausea medication as directed.  BELOW ARE SYMPTOMS THAT SHOULD BE REPORTED IMMEDIATELY: *FEVER GREATER THAN 100.4 F (38 C) OR HIGHER *CHILLS OR SWEATING *NAUSEA AND VOMITING THAT IS NOT CONTROLLED WITH YOUR NAUSEA MEDICATION *UNUSUAL SHORTNESS OF BREATH *UNUSUAL BRUISING OR BLEEDING *URINARY PROBLEMS (pain or burning when urinating, or frequent urination) *BOWEL PROBLEMS (unusual diarrhea, constipation, pain near the anus) TENDERNESS IN MOUTH AND THROAT WITH OR WITHOUT PRESENCE OF ULCERS (sore throat, sores in mouth, or a toothache) UNUSUAL RASH, SWELLING OR PAIN  UNUSUAL VAGINAL DISCHARGE OR ITCHING   Items with * indicate a potential emergency and should be followed up as soon as possible or go to the Emergency Department if any problems should occur.  Please show the CHEMOTHERAPY ALERT CARD or IMMUNOTHERAPY ALERT CARD at check-in to  the Emergency Department and triage nurse.  Should you have questions after your visit or need to cancel or reschedule your appointment, please contact [facility]  [phone] and follow the prompts.  Office hours are 8:00 a.m. to 4:30 p.m. Monday - Friday. Please note that voicemails left after 4:00 p.m. may not be returned until the following business day.  We are closed weekends and major holidays. You have access to a nurse at all times for urgent questions. Please call the main number to the clinic [phone] and follow the prompts.  For any non-urgent questions, you may also contact your provider using MyChart. We now offer e-Visits for anyone 18 and older to request care online for non-urgent symptoms. For details visit [email or web address].   Also download the MyChart app! Go to the app store, search "MyChart", open the app, select [facility], and log in with your MyChart username and password.    

## 2022-12-08 ENCOUNTER — Other Ambulatory Visit: Payer: Self-pay

## 2022-12-08 ENCOUNTER — Emergency Department: Payer: Medicare Other

## 2022-12-08 ENCOUNTER — Emergency Department
Admission: EM | Admit: 2022-12-08 | Discharge: 2022-12-08 | Disposition: A | Discharge status: Home / Self Care | Payer: Medicare Other | Attending: Emergency Medicine | Admitting: Emergency Medicine

## 2022-12-08 DIAGNOSIS — M7989 Other specified soft tissue disorders: Secondary | ICD-10-CM | POA: Diagnosis present

## 2022-12-08 DIAGNOSIS — I10 Essential (primary) hypertension: Secondary | ICD-10-CM | POA: Insufficient documentation

## 2022-12-08 DIAGNOSIS — R600 Localized edema: Secondary | ICD-10-CM | POA: Diagnosis not present

## 2022-12-08 DIAGNOSIS — R609 Edema, unspecified: Secondary | ICD-10-CM

## 2022-12-08 LAB — BASIC METABOLIC PANEL
Anion gap: 4 — ABNORMAL LOW (ref 5–15)
BUN: 12 mg/dL (ref 6–20)
CO2: 31 mmol/L (ref 22–32)
Calcium: 7.9 mg/dL — ABNORMAL LOW (ref 8.9–10.3)
Chloride: 92 mmol/L — ABNORMAL LOW (ref 98–111)
Creatinine, Ser: 0.46 mg/dL — ABNORMAL LOW (ref 0.61–1.24)
GFR, Estimated: >60 mL/min (ref >60)
Glucose, Bld: 76 mg/dL (ref 70–99)
Potassium: 4.1 mmol/L (ref 3.5–5.1)
Sodium: 127 mmol/L — ABNORMAL LOW (ref 135–145)

## 2022-12-08 LAB — CBC
HCT: 32.2 % — ABNORMAL LOW (ref 39.0–52.0)
Hemoglobin: 10.4 g/dL — ABNORMAL LOW (ref 13.0–17.0)
MCH: 27.8 pg (ref 26.0–34.0)
MCHC: 32.3 g/dL (ref 30.0–36.0)
MCV: 86.1 fL (ref 80.0–100.0)
Platelets: 286 10*3/uL (ref 150–400)
RBC: 3.74 MIL/uL — ABNORMAL LOW (ref 4.22–5.81)
RDW: 17.2 % — ABNORMAL HIGH (ref 11.5–15.5)
WBC: 5.2 10*3/uL (ref 4.0–10.5)
nRBC: 0 % (ref 0.0–0.2)

## 2022-12-08 LAB — BRAIN NATRIURETIC PEPTIDE: B Natriuretic Peptide: 54.2 pg/mL (ref 0.0–100.0)

## 2022-12-08 NOTE — ED Provider Notes (Signed)
[facility] Provider Note    Event Date/Time   First MD Initiated Contact with Patient 12/08/22 1125     (approximate)   History   Leg Swelling   HPI  Trevor Montgomery is a 62 y.o. male   currently followed by oncology, reviewed note from [name], patient currently on chemotherapy, also has schizoaffective disorder hypertension hyperlipidemia  Patient noticed in the last 2 days that he has had increasing swelling of both of his feet and ankles.  He called the oncology clinic today who recommended he come have an ultrasound to evaluate for blood clot  Denies shortness of breath no chest pain.  He does have chronic shortness of breath for clarification, but reports nothing new.  Uses 4 L of oxygen at all times.  No cough.  No fatigue.  No pain or discomfort anywhere  Relates that both of his legs from about the calf down have felt pretty swollen and he is getting swelling quite a bit around his sock lines        Physical Exam   Triage Vital Signs: ED Triage Vitals  Enc Vitals Group     BP 12/08/22 1017 138/76     Pulse Rate 12/08/22 1017 (!) 107     Resp 12/08/22 1017 18     Temp 12/08/22 1017 98 F (36.7 C)     Temp src --      SpO2 12/08/22 1017 100 %     Weight --      Height --      Head Circumference --      Peak Flow --      Pain Score 12/08/22 1016 0     Pain Loc --      Pain Edu? --      Excl. in [place]? --     Most recent vital signs: Vitals:   12/08/22 1017  BP: 138/76  Pulse: (!) 107  Resp: 18  Temp: 98 F (36.7 C)  SpO2: 100%     General: Awake, no distress.  Very pleasant, sister at bedside CV:  Good peripheral perfusion.  Normal tones and rate Resp:  Normal effort.  Clear bilaterally with normal work of breathing.  Patient is on 4 L oxygen which is his baseline Abd:  No distention.  Soft nontender nondistended throughout Other:  Moderate bilateral lower extremity edema pitting in the lower extremities.  No venous cords  or congestion.  No tenderness.  No overlying lesions or redness   ED Results / Procedures / Treatments   Labs (all labs ordered are listed, but only abnormal results are displayed) Labs Reviewed  CBC - Abnormal; Notable for the following components:      Result Value   RBC 3.74 (*)    Hemoglobin 10.4 (*)    HCT 32.2 (*)    RDW 17.2 (*)    All other components within normal limits  BASIC METABOLIC PANEL - Abnormal; Notable for the following components:   Sodium 127 (*)    Chloride 92 (*)    Creatinine, Ser 0.46 (*)    Calcium 7.9 (*)    Anion gap 4 (*)    All other components within normal limits  BRAIN NATRIURETIC PEPTIDE     EKG  Interpreted by me at 1140 heart rate 90 QRS 90 QTc 420 Normal sinus rhythm, no evidence of acute ischemia.  Minimal repolarization abnormality noted in 1 to aVL as well as lateral precordial leads.  RADIOLOGY  Chest x-ray interpreted as negative for acute finding   US Venous Img Lower Bilateral  Result Date: 12/08/2022 CLINICAL DATA:  exclude dvts due to new edema (cancer patient) EXAM: BILATERAL LOWER EXTREMITY VENOUS DOPPLER ULTRASOUND TECHNIQUE: Gray-scale sonography with compression, as well as color and duplex ultrasound, were performed to evaluate the deep venous system(s) from the level of the common femoral vein through the popliteal and proximal calf veins. COMPARISON:  None Available. FINDINGS: VENOUS Normal compressibility of the bilateral common femoral, superficial femoral, and popliteal veins, as well as the visualized calf veins. Visualized portions of profunda femoral vein and great saphenous vein unremarkable. No filling defects to suggest DVT on grayscale or color Doppler imaging. Doppler waveforms show normal direction of venous flow, normal respiratory plasticity and response to augmentation. OTHER There is subcutaneous soft tissue swelling right calf. Limitations: none IMPRESSION: No evidence of DVT in either lower extremity.  Subcutaneous soft tissue swelling along the right calf noted. Electronically Signed   By: Maurine Simmering M.D.   On: 12/08/2022 12:33   DG Chest 2 View  Result Date: 12/08/2022 CLINICAL DATA:  Bilateral leg and feet swelling. Pt states he noticed it this am. Pt states he does wear 4L o2 all the time. Pt does have hx of HTN and COPD. edema, legs, eval for volume overload EXAM: CHEST - 2 VIEW COMPARISON:  11/11/2022 FINDINGS: Port in the anterior chest wall with tip in distal SVC. Normal mediastinum and cardiac silhouette. Normal pulmonary vasculature. No evidence of effusion, infiltrate, or pneumothorax. No acute bony abnormality. IMPRESSION: No acute cardiopulmonary process. Electronically Signed   By: Suzy Bouchard M.D.   On: 12/08/2022 12:19     PROCEDURES:  Critical Care performed: No  Procedures   MEDICATIONS ORDERED IN ED: Medications - No data to display   IMPRESSION / MDM / [place] / ED COURSE  I reviewed the triage vital signs and the nursing notes.                              Differential diagnosis includes, but is not limited to, peripheral edema, DVT, chf,medication effect, third spacing, etc.  Patient pain and symptom free other than the and noted edema.  No dyspnea, on his baseline oxygen saturation and his work of breathing is normal.  No associated chest pains.  Work of breathing reassuring.    Patient's presentation is most consistent with acute complicated illness / injury requiring diagnostic workup.  Labs reviewed and interpreted as no significant acute changes although it is noted that he does have relatively low BNP arguing against development of acute CHF  Clinical Course as of 12/08/22 1422  Sun Dec 08, 2022  1126 Labs reviewed, chronic anemia.  Hyponatremia is noted but appears to be chronic in nature as well [MQ]    Clinical Course User Index [MQ] Delman Kitten, MD   [name] reviewed charted, discussed case and work-up, advises low  albumin could be causing (3rd spacing), amlodipine, or other cause. Recommends compression stockings, stop amlodipine, follow-up with cancer this week. Dr. B will also let [name] know so appointment can be made for him this week.   Return precautions and treatment recommendations and follow-up discussed with the patient who is agreeable with the plan.   FINAL CLINICAL IMPRESSION(S) / ED DIAGNOSES   Final diagnoses:  Peripheral edema     Rx / DC Orders   ED Discharge  Orders     None        Note:  This document was prepared using Dragon voice recognition software and may include unintentional dictation errors.   Delman Kitten, MD 12/08/22 [id number]

## 2022-12-08 NOTE — ED Triage Notes (Addendum)
Pt comes with c/o bilateral leg and feet swelling. Pt states he noticed it this am. Pt states he does wear 4L [place] all the time. Pt denies any chf or sob.  Pt does have hx of HTN and COPD.

## 2022-12-08 NOTE — Discharge Instructions (Signed)
Stop amlodipine.  Please go to a local store and purchase "compression stockings" which may help and also elevate your legs when possible  Please follow-up with Dr. Ola Spurr in clinic this week to have your swelling and blood pressure reevaluated  Return to the emergency room right away if you start developing symptoms such as pain, difficulty breathing, chest pain, fever, or other new concerns arise.

## 2022-12-09 ENCOUNTER — Encounter: Payer: Self-pay | Admitting: Hospice and Palliative Medicine

## 2022-12-09 ENCOUNTER — Inpatient Hospital Stay (HOSPITAL_BASED_OUTPATIENT_CLINIC_OR_DEPARTMENT_OTHER): Payer: Medicare Other | Admitting: Hospice and Palliative Medicine

## 2022-12-09 ENCOUNTER — Other Ambulatory Visit: Payer: Self-pay | Admitting: *Deleted

## 2022-12-09 ENCOUNTER — Inpatient Hospital Stay: Payer: Medicare Other

## 2022-12-09 VITALS — BP 140/76 | HR 90 | Temp 97.4°F | Wt 192.9 lb

## 2022-12-09 DIAGNOSIS — C3490 Malignant neoplasm of unspecified part of unspecified bronchus or lung: Secondary | ICD-10-CM

## 2022-12-09 DIAGNOSIS — E876 Hypokalemia: Secondary | ICD-10-CM

## 2022-12-09 DIAGNOSIS — R609 Edema, unspecified: Secondary | ICD-10-CM | POA: Diagnosis not present

## 2022-12-09 DIAGNOSIS — Z5111 Encounter for antineoplastic chemotherapy: Secondary | ICD-10-CM | POA: Diagnosis not present

## 2022-12-09 LAB — CMP (CANCER CENTER ONLY)
ALT: 22 U/L (ref 0–44)
AST: 26 U/L (ref 15–41)
Albumin: 3.5 g/dL (ref 3.5–5.0)
Alkaline Phosphatase: 203 U/L — ABNORMAL HIGH (ref 38–126)
Anion gap: 9 (ref 5–15)
BUN: 9 mg/dL (ref 6–20)
CO2: 29 mmol/L (ref 22–32)
Calcium: 8 mg/dL — ABNORMAL LOW (ref 8.9–10.3)
Chloride: 89 mmol/L — ABNORMAL LOW (ref 98–111)
Creatinine: 0.47 mg/dL — ABNORMAL LOW (ref 0.61–1.24)
GFR, Estimated: >60 mL/min (ref >60)
Glucose, Bld: 108 mg/dL — ABNORMAL HIGH (ref 70–99)
Potassium: 4.2 mmol/L (ref 3.5–5.1)
Sodium: 127 mmol/L — ABNORMAL LOW (ref 135–145)
Total Bilirubin: 0.5 mg/dL (ref 0.3–1.2)
Total Protein: 5.9 g/dL — ABNORMAL LOW (ref 6.5–8.1)

## 2022-12-09 LAB — CBC WITH DIFFERENTIAL (CANCER CENTER ONLY)
Abs Immature Granulocytes: 0.04 10*3/uL (ref 0.00–0.07)
Basophils Absolute: 0 10*3/uL (ref 0.0–0.1)
Basophils Relative: 0 %
Eosinophils Absolute: 0 10*3/uL (ref 0.0–0.5)
Eosinophils Relative: 0 %
HCT: 27.1 % — ABNORMAL LOW (ref 39.0–52.0)
Hemoglobin: 8.9 g/dL — ABNORMAL LOW (ref 13.0–17.0)
Immature Granulocytes: 1 %
Lymphocytes Relative: 12 %
Lymphs Abs: 0.4 10*3/uL — ABNORMAL LOW (ref 0.7–4.0)
MCH: 28 pg (ref 26.0–34.0)
MCHC: 32.8 g/dL (ref 30.0–36.0)
MCV: 85.2 fL (ref 80.0–100.0)
Monocytes Absolute: 0.1 10*3/uL (ref 0.1–1.0)
Monocytes Relative: 3 %
Neutro Abs: 3.1 10*3/uL (ref 1.7–7.7)
Neutrophils Relative %: 84 %
Platelet Count: 329 10*3/uL (ref 150–400)
RBC: 3.18 MIL/uL — ABNORMAL LOW (ref 4.22–5.81)
RDW: 16.4 % — ABNORMAL HIGH (ref 11.5–15.5)
WBC Count: 3.7 10*3/uL — ABNORMAL LOW (ref 4.0–10.5)
nRBC: 0 % (ref 0.0–0.2)

## 2022-12-09 LAB — MAGNESIUM: Magnesium: 1.7 mg/dL (ref 1.7–2.4)

## 2022-12-09 MED ORDER — PEGFILGRASTIM-CBQV 6 MG/0.6ML ~~LOC~~ SOSY
6.0000 mg | PREFILLED_SYRINGE | Freq: Once | SUBCUTANEOUS | Status: AC
  Administered 2022-12-09: 6 mg via SUBCUTANEOUS
  Filled 2022-12-09: qty 0.6

## 2022-12-09 NOTE — Progress Notes (Signed)
Symptom Management [place] at [facility] Telephone:(336) [phone] Fax:(336) [phone]  Patient Care Team: Tracie Harrier, MD as PCP - General (Internal Medicine) Telford Nab, RN as Oncology Nurse Navigator   NAME OF PATIENT: Trevor Montgomery  ID:[id number]  July 16, 1961   DATE OF VISIT: 12/09/22  REASON FOR CONSULT: Trevor Montgomery is a 62 y.o. 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  INTERVAL HISTORY: Patient currently on chemo with carbo/etoposide/Tecentriq.  He was seen yesterday in the emergency department for bilateral lower extremity edema.  Dopplers were negative for DVT.  Patient had a low BNP not suggestive of CHF.  Conservative measures were recommended and amlodipine was discontinued.  Patient follows up with [facility] today also for evaluation of lower extremity edema.  Today, patient reports his edema is slightly improved. Denies other changes or concerns.   PAST MEDICAL HISTORY: Past Medical History:  Diagnosis Date   Anxiety    Arthritis    Emphysema of lung ([place])    HTN (hypertension)    Hyperlipemia    Schizoaffective disorder, bipolar type ([place])    Substance abuse ([place])     PAST SURGICAL HISTORY:  Past Surgical History:  Procedure Laterality Date   FINGER SURGERY     IR IMAGING GUIDED PORT INSERTION  11/04/2022   VIDEO BRONCHOSCOPY WITH ENDOBRONCHIAL ULTRASOUND Left 10/11/2022   Procedure: VIDEO BRONCHOSCOPY WITH ENDOBRONCHIAL ULTRASOUND;  Surgeon: Armando Reichert, MD;  Location: [facility] ORS;  Service: Pulmonary;  Laterality: Left;    HEMATOLOGY/ONCOLOGY HISTORY:  Oncology History  Small cell lung cancer ([place])  10/29/2022 Initial Diagnosis   Small cell lung cancer ([place])   10/29/2022 Cancer Staging   Staging form: Lung, AJCC 8th Edition - Clinical: Stage IVA (cT4, cN2,  cM1a) - Signed by Lloyd Huger, MD on 10/29/2022   11/12/2022 -  Chemotherapy   Patient is on Treatment Plan : LUNG SCLC Carboplatin + Etoposide + Atezolizumab Induction q21d x 4 cycles / Atezolizumab Maintenance q21d       ALLERGIES:  has No Known Allergies.  MEDICATIONS:  Current Outpatient Medications  Medication Sig Dispense Refill   albuterol (VENTOLIN HFA) 108 (90 Base) MCG/ACT inhaler Inhale 2 puffs into the lungs every 6 (six) hours as needed for wheezing or shortness of breath.     benztropine (COGENTIN) 2 MG tablet Take 2 mg by mouth 2 (two) times daily.     bisacodyl (DULCOLAX) 5 MG EC tablet Take 1 tablet (5 mg total) by mouth daily as needed for moderate constipation. 30 tablet 0   dexamethasone (DECADRON) 4 MG tablet Take 1 tablet (4 mg total) by mouth daily. 30 tablet 1   docusate sodium (COLACE) 100 MG capsule Take 1 capsule (100 mg total) by mouth 2 (two) times daily as needed for mild constipation. 10 capsule 0   fluPHENAZine (PROLIXIN) 5 MG tablet Take 2.5 mg by mouth daily.     guaiFENesin (MUCINEX) 600 MG 12 hr tablet Take 1 tablet (600 mg total) by mouth 2 (two) times daily as needed for cough. 30 tablet 0   HYDROcodone-acetaminophen (NORCO/VICODIN) 5-325 MG tablet Take 1-2 tablets by mouth every 6 (six) hours as needed for moderate pain. 30 tablet 0   ipratropium-albuterol (DUONEB) 0.5-2.5 (3) MG/3ML SOLN Take 3 mLs by nebulization every 4 (four) hours as needed. 360 mL 0   lidocaine-prilocaine (EMLA) cream  Apply to affected area once 30 g 3   meloxicam (MOBIC) 15 MG tablet Take 15 mg by mouth daily.     Multiple Vitamin (MULTI-VITAMIN) tablet Take 1 tablet by mouth daily.     potassium chloride SA (KLOR-CON M) 20 MEQ tablet Take 1 tablet (20 mEq total) by mouth 2 (two) times daily. 60 tablet 1   propranolol (INDERAL) 10 MG tablet Take 0.5 tablets (5 mg total) by mouth 2 (two) times daily. 60 tablet 0   simvastatin (ZOCOR) 20 MG tablet Take 20 mg by mouth at  bedtime.     sodium chloride 1 g tablet Take 1 tablet (1 g total) by mouth 3 (three) times daily. 30 tablet 2   Vitamin E 400 units TABS Take 400 Units by mouth daily.     No current facility-administered medications for this visit.    VITAL SIGNS: There were no vitals taken for this visit. There were no vitals filed for this visit.  Estimated body mass index is 29.22 kg/m as calculated from the following:   Height as of 12/04/22: 5\' 9"  (1.753 m).   Weight as of 12/04/22: 197 lb 14.4 oz (89.8 kg).  LABS: CBC:    Component Value Date/Time   WBC 5.2 12/08/2022 1020   HGB 10.4 (L) 12/08/2022 1020   HCT 32.2 (L) 12/08/2022 1020   PLT 286 12/08/2022 1020   MCV 86.1 12/08/2022 1020   NEUTROABS 10.3 (H) 12/04/2022 0841   LYMPHSABS 1.2 12/04/2022 0841   MONOABS 1.2 (H) 12/04/2022 0841   EOSABS 0.0 12/04/2022 0841   BASOSABS 0.0 12/04/2022 0841   Comprehensive Metabolic Panel:    Component Value Date/Time   NA 127 (L) 12/08/2022 1020   K 4.1 12/08/2022 1020   CL 92 (L) 12/08/2022 1020   CO2 31 12/08/2022 1020   BUN 12 12/08/2022 1020   CREATININE 0.46 (L) 12/08/2022 1020   GLUCOSE 76 12/08/2022 1020   CALCIUM 7.9 (L) 12/08/2022 1020   AST 28 12/04/2022 0841   ALT 21 12/04/2022 0841   ALKPHOS 253 (H) 12/04/2022 0841   BILITOT 0.4 12/04/2022 0841   PROT 5.6 (L) 12/04/2022 0841   ALBUMIN 3.2 (L) 12/04/2022 0841    RADIOGRAPHIC STUDIES: US Venous Img Lower Bilateral  Result Date: 12/08/2022 CLINICAL DATA:  exclude dvts due to new edema (cancer patient) EXAM: BILATERAL LOWER EXTREMITY VENOUS DOPPLER ULTRASOUND TECHNIQUE: Gray-scale sonography with compression, as well as color and duplex ultrasound, were performed to evaluate the deep venous system(s) from the level of the common femoral vein through the popliteal and proximal calf veins. COMPARISON:  None Available. FINDINGS: VENOUS Normal compressibility of the bilateral common femoral, superficial femoral, and popliteal veins,  as well as the visualized calf veins. Visualized portions of profunda femoral vein and great saphenous vein unremarkable. No filling defects to suggest DVT on grayscale or color Doppler imaging. Doppler waveforms show normal direction of venous flow, normal respiratory plasticity and response to augmentation. OTHER There is subcutaneous soft tissue swelling right calf. Limitations: none IMPRESSION: No evidence of DVT in either lower extremity. Subcutaneous soft tissue swelling along the right calf noted. Electronically Signed   By: Maurine Simmering M.D.   On: 12/08/2022 12:33   DG Chest 2 View  Result Date: 12/08/2022 CLINICAL DATA:  Bilateral leg and feet swelling. Pt states he noticed it this am. Pt states he does wear 4L o2 all the time. Pt does have hx of HTN and COPD. edema, legs, eval for volume  overload EXAM: CHEST - 2 VIEW COMPARISON:  11/11/2022 FINDINGS: Port in the anterior chest wall with tip in distal SVC. Normal mediastinum and cardiac silhouette. Normal pulmonary vasculature. No evidence of effusion, infiltrate, or pneumothorax. No acute bony abnormality. IMPRESSION: No acute cardiopulmonary process. Electronically Signed   By: Suzy Bouchard M.D.   On: 12/08/2022 12:19   CT HEAD WO CONTRAST (5MM)  Result Date: 11/12/2022 CLINICAL DATA:  Altered mental status EXAM: CT HEAD WITHOUT CONTRAST TECHNIQUE: Contiguous axial images were obtained from the base of the skull through the vertex without intravenous contrast. RADIATION DOSE REDUCTION: This exam was performed according to the departmental dose-optimization program which includes automated exposure control, adjustment of the mA and/or kV according to patient size and/or use of iterative reconstruction technique. COMPARISON:  CT brain 10/11/2022 FINDINGS: Brain: No acute territorial infarction, hemorrhage or intracranial mass. The ventricles are nonenlarged. Vascular: No hyperdense vessels.  Carotid vascular calcification Skull: Normal. Negative  for fracture or focal lesion. Sinuses/Orbits: No acute finding. Other: None IMPRESSION: Negative non contrasted CT appearance of the brain. Electronically Signed   By: Donavan Foil M.D.   On: 11/12/2022 20:48   CT Angio Chest PE W and/or Wo Contrast  Result Date: 11/12/2022 CLINICAL DATA:  Syncope altered mental status EXAM: CT ANGIOGRAPHY CHEST WITH CONTRAST TECHNIQUE: Multidetector CT imaging of the chest was performed using the standard protocol during bolus administration of intravenous contrast. Multiplanar CT image reconstructions and MIPs were obtained to evaluate the vascular anatomy. RADIATION DOSE REDUCTION: This exam was performed according to the departmental dose-optimization program which includes automated exposure control, adjustment of the mA and/or kV according to patient size and/or use of iterative reconstruction technique. CONTRAST:  185mL OMNIPAQUE IOHEXOL 350 MG/ML SOLN COMPARISON:  Chest x-ray 11/11/2022, PET CT 11/04/2022, chest CT 10/11/2022, FINDINGS: Cardiovascular: Satisfactory opacification of the pulmonary arteries to the segmental level. No evidence of pulmonary embolism. Nonaneurysmal aorta. Normal cardiac size. No pericardial effusion. Mediastinum/Nodes: Midline trachea. No thyroid mass. Interval decrease in previously noted left upper lobe consolidative focus with residual linear density in the region, series 6, image 66. Left lower lobe describes spiculated focus is also decreased compared to the prior chest CT without measurable mass in the region, series 6, image 45. Interval decrease in left AP window/hilar mass compared to the prior chest CT with residual soft tissue density. 10 mm high prevascular node, series 4, image 38, previously this measured 5 mm on interval PET CT. No new or enlarging lymph nodes. Esophagus within normal limits. Lungs/Pleura: Emphysema. No acute airspace disease, pleural effusion, or pneumothorax. Upper Abdomen: No acute finding. Hypodense liver  lesions without corresponding finding on the PET CT from 11/04/2022. For example right hepatic lobe 1.7 cm lesion, series 4, image 152. 1.7 cm hypodense lesion in the left anterior hepatic dome, series 4, image 112. 1.6 cm right adrenal nodule, hypermetabolic on previous PET CT. Musculoskeletal: Subtle lucent lesions within the spine, corresponding to history of metastatic disease, this is most evident involving right aspect of T12 vertebral body where there is osseous destruction and soft tissue mass measuring approximately 2.9 x 2.5 cm on series 4, image 128. Review of the MIP images confirms the above findings. IMPRESSION: 1. Negative for acute pulmonary embolus. 2. Emphysema. Interval decrease in previously noted left AP window/hilar mass and or nodes compared to the prior chest CT. Decreased left lower lobe pulmonary nodule and consolidative focus in the left upper lobe compared to the prior CT. 3. 10 mm high  prevascular lymph node, previously this measured 5 mm on interval PET CT and was hypermetabolic consistent with metastatic disease. 4. Several hypodense liver lesions without corresponding finding on the PET CT from 11/04/2022, but appear new or increased compared to the prior abdomen CT and concerning for metastatic disease. 5. 1.6 cm right adrenal nodule, hypermetabolic on previous PET CT, suspicious for metastatic disease. 6. Findings consistent with skeletal metastatic disease. Emphysema (ICD10-J43.9). Electronically Signed   By: Donavan Foil M.D.   On: 11/12/2022 20:45   DG Chest 2 View  Result Date: 11/11/2022 CLINICAL DATA:  Small cell lung carcinoma, shortness of breath, cough and wheezing. EXAM: CHEST - 2 VIEW COMPARISON:  10/11/2022 FINDINGS: Interval right jugular Port-A-Cath placement with the catheter tip in the lower SVC. The heart size and mediastinal contours are within normal limits. Decreased prominence soft tissue in the left hilar region likely consistent with response to treatment  of known small cell lung carcinoma centered in the left superior hilar region and extending throughout the hilum and into the mediastinum by prior imaging. No pulmonary edema, pleural fluid or pneumothorax. The visualized skeletal structures are unremarkable. IMPRESSION: Decreased prominence of soft tissue in the left hilar region likely consistent with response to treatment of known small cell lung carcinoma centered in the left superior hilar region and extending into the mediastinum. Electronically Signed   By: Aletta Edouard M.D.   On: 11/11/2022 15:54    PERFORMANCE STATUS (ECOG) : 2 - Symptomatic, <50% confined to bed  Review of Systems Unless otherwise noted, a complete review of systems is negative.  Physical Exam General: NAD Cardiovascular: regular rate and rhythm Pulmonary: Clear anterior/posterior fields, on O2 Abdomen: soft, nontender, + bowel sounds GU: no suprapubic tenderness Extremities: Bilateral lower extremity edema right greater than left Skin: no rashes Neurological: Weakness but otherwise nonfocal  IMPRESSION/PLAN: Stage IV non-small cell lung cancer -prognosis poor.  Patient has extensive disease.  On systemic chemotherapy.  Lower extremity edema -negative Dopplers yesterday.  Agree with conservative measures such as stopping amlodipine, compression stockings, and elevation.  If no improvement, could consider low-dose diuretic versus referral to vascular.  RTC 2 weeks to see MD.  Case and plan discussed with [name]  Patient expressed understanding and was in agreement with this plan. He also understands that He can call clinic at any time with any questions, concerns, or complaints.   Thank you for allowing me to participate in the care of this very pleasant patient.   Time Total: 15 minutes  Visit consisted of counseling and education dealing with the complex and emotionally intense issues of symptom management in the setting of serious illness.Greater  than 50%  of this time was spent counseling and coordinating care related to the above assessment and plan.  Signed by: Altha Harm, PhD, NP-C

## 2022-12-11 DIAGNOSIS — J9611 Chronic respiratory failure with hypoxia: Secondary | ICD-10-CM | POA: Insufficient documentation

## 2022-12-24 ENCOUNTER — Ambulatory Visit

## 2022-12-24 ENCOUNTER — Ambulatory Visit: Admitting: Oncology

## 2022-12-24 ENCOUNTER — Other Ambulatory Visit

## 2022-12-24 MED FILL — Dexamethasone Sodium Phosphate Inj 100 MG/10ML: INTRAMUSCULAR | Qty: 1 | Status: AC

## 2022-12-25 ENCOUNTER — Inpatient Hospital Stay: Payer: Medicare Other | Attending: Oncology

## 2022-12-25 ENCOUNTER — Ambulatory Visit

## 2022-12-25 ENCOUNTER — Other Ambulatory Visit: Payer: Self-pay | Admitting: *Deleted

## 2022-12-25 ENCOUNTER — Encounter: Payer: Self-pay | Admitting: Oncology

## 2022-12-25 ENCOUNTER — Inpatient Hospital Stay: Payer: Medicare Other

## 2022-12-25 ENCOUNTER — Inpatient Hospital Stay (HOSPITAL_BASED_OUTPATIENT_CLINIC_OR_DEPARTMENT_OTHER): Payer: Medicare Other | Admitting: Oncology

## 2022-12-25 VITALS — BP 141/87 | HR 88 | Temp 97.0°F | Resp 16 | Ht 69.0 in | Wt 188.0 lb

## 2022-12-25 DIAGNOSIS — D649 Anemia, unspecified: Secondary | ICD-10-CM | POA: Insufficient documentation

## 2022-12-25 DIAGNOSIS — C3432 Malignant neoplasm of lower lobe, left bronchus or lung: Secondary | ICD-10-CM | POA: Insufficient documentation

## 2022-12-25 DIAGNOSIS — E871 Hypo-osmolality and hyponatremia: Secondary | ICD-10-CM | POA: Insufficient documentation

## 2022-12-25 DIAGNOSIS — Z79899 Other long term (current) drug therapy: Secondary | ICD-10-CM | POA: Diagnosis not present

## 2022-12-25 DIAGNOSIS — C3412 Malignant neoplasm of upper lobe, left bronchus or lung: Secondary | ICD-10-CM | POA: Insufficient documentation

## 2022-12-25 DIAGNOSIS — Z5112 Encounter for antineoplastic immunotherapy: Secondary | ICD-10-CM | POA: Diagnosis not present

## 2022-12-25 DIAGNOSIS — I10 Essential (primary) hypertension: Secondary | ICD-10-CM | POA: Diagnosis not present

## 2022-12-25 DIAGNOSIS — Z5111 Encounter for antineoplastic chemotherapy: Secondary | ICD-10-CM | POA: Diagnosis present

## 2022-12-25 DIAGNOSIS — C3490 Malignant neoplasm of unspecified part of unspecified bronchus or lung: Secondary | ICD-10-CM

## 2022-12-25 DIAGNOSIS — C7951 Secondary malignant neoplasm of bone: Secondary | ICD-10-CM | POA: Insufficient documentation

## 2022-12-25 DIAGNOSIS — Z87891 Personal history of nicotine dependence: Secondary | ICD-10-CM | POA: Diagnosis not present

## 2022-12-25 DIAGNOSIS — Z5189 Encounter for other specified aftercare: Secondary | ICD-10-CM | POA: Diagnosis not present

## 2022-12-25 LAB — COMPREHENSIVE METABOLIC PANEL
ALT: 14 U/L (ref 0–44)
AST: 26 U/L (ref 15–41)
Albumin: 3.6 g/dL (ref 3.5–5.0)
Alkaline Phosphatase: 137 U/L — ABNORMAL HIGH (ref 38–126)
Anion gap: 7 (ref 5–15)
BUN: <5 mg/dL — ABNORMAL LOW (ref 6–20)
CO2: 27 mmol/L (ref 22–32)
Calcium: 8.3 mg/dL — ABNORMAL LOW (ref 8.9–10.3)
Chloride: 97 mmol/L — ABNORMAL LOW (ref 98–111)
Creatinine, Ser: 0.51 mg/dL — ABNORMAL LOW (ref 0.61–1.24)
GFR, Estimated: >60 mL/min (ref >60)
Glucose, Bld: 170 mg/dL — ABNORMAL HIGH (ref 70–99)
Potassium: 3.6 mmol/L (ref 3.5–5.1)
Sodium: 131 mmol/L — ABNORMAL LOW (ref 135–145)
Total Bilirubin: 0.3 mg/dL (ref 0.3–1.2)
Total Protein: 6.4 g/dL — ABNORMAL LOW (ref 6.5–8.1)

## 2022-12-25 LAB — CBC WITH DIFFERENTIAL/PLATELET
Abs Immature Granulocytes: 1.12 10*3/uL — ABNORMAL HIGH (ref 0.00–0.07)
Basophils Absolute: 0.1 10*3/uL (ref 0.0–0.1)
Basophils Relative: 1 %
Eosinophils Absolute: 0 10*3/uL (ref 0.0–0.5)
Eosinophils Relative: 0 %
HCT: 20.9 % — ABNORMAL LOW (ref 39.0–52.0)
Hemoglobin: 6.7 g/dL — CL (ref 13.0–17.0)
Immature Granulocytes: 11 %
Lymphocytes Relative: 19 %
Lymphs Abs: 1.9 10*3/uL (ref 0.7–4.0)
MCH: 28.4 pg (ref 26.0–34.0)
MCHC: 32.1 g/dL (ref 30.0–36.0)
MCV: 88.6 fL (ref 80.0–100.0)
Monocytes Absolute: 1 10*3/uL (ref 0.1–1.0)
Monocytes Relative: 10 %
Neutro Abs: 5.9 10*3/uL (ref 1.7–7.7)
Neutrophils Relative %: 59 %
Platelets: 421 10*3/uL — ABNORMAL HIGH (ref 150–400)
RBC: 2.36 MIL/uL — ABNORMAL LOW (ref 4.22–5.81)
RDW: 22.5 % — ABNORMAL HIGH (ref 11.5–15.5)
Smear Review: NORMAL
WBC: 9.9 10*3/uL (ref 4.0–10.5)
nRBC: 2.8 % — ABNORMAL HIGH (ref 0.0–0.2)

## 2022-12-25 LAB — TSH: TSH: 1.998 u[IU]/mL (ref 0.350–4.500)

## 2022-12-25 LAB — PREPARE RBC (CROSSMATCH)

## 2022-12-25 LAB — ABO/RH: ABO/RH(D): A POS

## 2022-12-25 MED ORDER — SODIUM CHLORIDE 0.9% FLUSH
10.0000 mL | INTRAVENOUS | Status: DC | PRN
  Administered 2022-12-25: 10 mL via INTRAVENOUS
  Filled 2022-12-25: qty 10

## 2022-12-25 MED ORDER — SODIUM CHLORIDE 0.9 % IV SOLN
750.0000 mg | Freq: Once | INTRAVENOUS | Status: AC
  Administered 2022-12-25: 750 mg via INTRAVENOUS
  Filled 2022-12-25: qty 75

## 2022-12-25 MED ORDER — SODIUM CHLORIDE 0.9 % IV SOLN
150.0000 mg | Freq: Once | INTRAVENOUS | Status: AC
  Administered 2022-12-25: 150 mg via INTRAVENOUS
  Filled 2022-12-25: qty 150

## 2022-12-25 MED ORDER — PALONOSETRON HCL INJECTION 0.25 MG/5ML
0.2500 mg | Freq: Once | INTRAVENOUS | Status: AC
  Administered 2022-12-25: 0.25 mg via INTRAVENOUS
  Filled 2022-12-25: qty 5

## 2022-12-25 MED ORDER — SODIUM CHLORIDE 0.9 % IV SOLN
10.0000 mg | Freq: Once | INTRAVENOUS | Status: AC
  Administered 2022-12-25: 10 mg via INTRAVENOUS
  Filled 2022-12-25: qty 10

## 2022-12-25 MED ORDER — HEPARIN SOD (PORK) LOCK FLUSH 100 UNIT/ML IV SOLN
500.0000 [IU] | Freq: Once | INTRAVENOUS | Status: AC | PRN
  Administered 2022-12-25: 500 [IU]
  Filled 2022-12-25: qty 5

## 2022-12-25 MED ORDER — SODIUM CHLORIDE 0.9% FLUSH
10.0000 mL | INTRAVENOUS | Status: DC | PRN
  Administered 2022-12-25: 10 mL
  Filled 2022-12-25: qty 10

## 2022-12-25 MED ORDER — SODIUM CHLORIDE 0.9 % IV SOLN
Freq: Once | INTRAVENOUS | Status: AC
  Filled 2022-12-25: qty 250

## 2022-12-25 MED ORDER — SODIUM CHLORIDE 0.9 % IV SOLN
1200.0000 mg | Freq: Once | INTRAVENOUS | Status: AC
  Administered 2022-12-25: 1200 mg via INTRAVENOUS
  Filled 2022-12-25: qty 20

## 2022-12-25 MED ORDER — SODIUM CHLORIDE 0.9 % IV SOLN
100.0000 mg/m2 | Freq: Once | INTRAVENOUS | Status: AC
  Administered 2022-12-25: 200 mg via INTRAVENOUS
  Filled 2022-12-25: qty 10

## 2022-12-25 MED FILL — Dexamethasone Sodium Phosphate Inj 100 MG/10ML: INTRAMUSCULAR | Qty: 1 | Status: AC

## 2022-12-25 NOTE — Patient Instructions (Signed)
[facility]  Discharge Instructions: Thank you for choosing [facility] to provide your oncology and hematology care.  If you have a lab appointment with the [facility], please go directly to the [facility] and check in at the registration area.  Wear comfortable clothing and clothing appropriate for easy access to any Portacath or PICC line.   We strive to give you quality time with your provider. You may need to reschedule your appointment if you arrive late (15 or more minutes).  Arriving late affects you and other patients whose appointments are after yours.  Also, if you miss three or more appointments without notifying the office, you may be dismissed from the clinic at the provider's discretion.      For prescription refill requests, have your pharmacy contact our office and allow 72 hours for refills to be completed.    Today you received the following chemotherapy and/or immunotherapy agents: Carboplatin + Etoposide + Atezolizumab    To help prevent nausea and vomiting after your treatment, we encourage you to take your nausea medication as directed.  BELOW ARE SYMPTOMS THAT SHOULD BE REPORTED IMMEDIATELY: *FEVER GREATER THAN 100.4 F (38 C) OR HIGHER *CHILLS OR SWEATING *NAUSEA AND VOMITING THAT IS NOT CONTROLLED WITH YOUR NAUSEA MEDICATION *UNUSUAL SHORTNESS OF BREATH *UNUSUAL BRUISING OR BLEEDING *URINARY PROBLEMS (pain or burning when urinating, or frequent urination) *BOWEL PROBLEMS (unusual diarrhea, constipation, pain near the anus) TENDERNESS IN MOUTH AND THROAT WITH OR WITHOUT PRESENCE OF ULCERS (sore throat, sores in mouth, or a toothache) UNUSUAL RASH, SWELLING OR PAIN  UNUSUAL VAGINAL DISCHARGE OR ITCHING   Items with * indicate a potential emergency and should be followed up as soon as possible or go to the Emergency Department if any problems should occur.  Please show the CHEMOTHERAPY ALERT CARD or  IMMUNOTHERAPY ALERT CARD at check-in to the Emergency Department and triage nurse.  Should you have questions after your visit or need to cancel or reschedule your appointment, please contact [facility]  [phone] and follow the prompts.  Office hours are 8:00 a.m. to 4:30 p.m. Monday - Friday. Please note that voicemails left after 4:00 p.m. may not be returned until the following business day.  We are closed weekends and major holidays. You have access to a nurse at all times for urgent questions. Please call the main number to the clinic [phone] and follow the prompts.  For any non-urgent questions, you may also contact your provider using MyChart. We now offer e-Visits for anyone 78 and older to request care online for non-urgent symptoms. For details visit [email or web address].   Also download the MyChart app! Go to the app store, search "MyChart", open the app, select [facility], and log in with your MyChart username and password.

## 2022-12-25 NOTE — Progress Notes (Signed)
Patient tolerated chemo well today. Patient and sister notified and blood transfusion same day surgery Friday at [facility] and arrive at 730. Also aware of [facility] appointments Thurs and Fri. Verbalizes understanding, no concerns voiced.

## 2022-12-25 NOTE — Progress Notes (Unsigned)
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  INTERVAL HISTORY: Patient returns to clinic today for further evaluation and consideration of cycle 3, day 1 of cisplatin and etoposide.  He has increased weakness and fatigue, but otherwise feels well.  He is tolerating his treatments without significant side effects.  He does not complain of pain today.  He continues to have chronic shortness of breath and cough.  He has no neurologic complaints.  He denies any recent fevers.  He has a good appetite and denies weight loss.  He has no chest pain or hemoptysis.  He has no nausea, vomiting, constipation, or diarrhea.  He has no urinary complaints.  Patient offers no further specific complaints today.  REVIEW OF SYSTEMS:   Review of Systems  Constitutional:  Positive for malaise/fatigue. Negative for fever and weight loss.  Respiratory:  Positive for cough and shortness of breath. Negative for hemoptysis.   Cardiovascular: Negative.  Negative for chest pain and leg swelling.  Gastrointestinal: Negative.  Negative for abdominal pain and nausea.  Genitourinary: Negative.  Negative for dysuria.  Musculoskeletal: Negative.  Negative for back pain and joint pain.  Skin: Negative.  Negative for rash.  Neurological: Negative.  Negative for dizziness, focal weakness, weakness and headaches.  Psychiatric/Behavioral: Negative.  The patient is not nervous/anxious.     As per HPI. Otherwise, a complete review of systems is negative.  PAST MEDICAL HISTORY: Past Medical History:  Diagnosis Date   Anxiety    Arthritis    Emphysema of lung    HTN (hypertension)    Hyperlipemia    Schizoaffective disorder, bipolar type     Substance abuse     PAST SURGICAL HISTORY: Past Surgical History:  Procedure Laterality Date   FINGER SURGERY     IR IMAGING GUIDED PORT INSERTION  11/04/2022   VIDEO BRONCHOSCOPY WITH ENDOBRONCHIAL ULTRASOUND Left 10/11/2022   Procedure: VIDEO BRONCHOSCOPY WITH ENDOBRONCHIAL ULTRASOUND;  Surgeon: Armando Reichert, MD;  Location: [facility] ORS;  Service: Pulmonary;  Laterality: Left;    FAMILY HISTORY: Family History  Problem Relation Age of Onset   Stroke Father    Seizures Father    Stroke Sister    Cancer Maternal Grandmother     ADVANCED DIRECTIVES (Y/N):  N  HEALTH MAINTENANCE: Social History   Tobacco Use   Smoking status: Former    Packs/day: 0.50    Years: 40.00    Additional pack years: 0.00    Total pack years: 20.00    Types: Cigarettes    Quit date: 10/15/2022    Years since quitting: 0.1   Smokeless tobacco: Former    Quit date: 10/15/2022  Substance Use Topics   Alcohol use: No   Drug use: No     Colonoscopy:  PAP:  Bone density:  Lipid panel:  No Known Allergies  Current Outpatient Medications  Medication Sig Dispense Refill   albuterol (VENTOLIN HFA) 108 (90 Base) MCG/ACT inhaler Inhale 2 puffs into the lungs every 6 (six) hours as needed for wheezing or shortness of breath.     benztropine (COGENTIN) 2 MG tablet Take 2 mg by mouth 2 (two) times daily.     bisacodyl (DULCOLAX) 5 MG EC tablet Take 1 tablet (5 mg total)  by mouth daily as needed for moderate constipation. 30 tablet 0   dexamethasone (DECADRON) 4 MG tablet Take 1 tablet (4 mg total) by mouth daily. 30 tablet 1   docusate sodium (COLACE) 100 MG capsule Take 1 capsule (100 mg total) by mouth 2 (two) times daily as needed for mild constipation. 10 capsule 0   fluPHENAZine (PROLIXIN) 5 MG tablet Take 2.5 mg by mouth daily.     guaiFENesin (MUCINEX) 600 MG 12 hr tablet Take 1 tablet (600 mg total) by mouth 2 (two) times daily as needed for cough. 30 tablet 0   HYDROcodone-acetaminophen  (NORCO/VICODIN) 5-325 MG tablet Take 1-2 tablets by mouth every 6 (six) hours as needed for moderate pain. 30 tablet 0   ipratropium-albuterol (DUONEB) 0.5-2.5 (3) MG/3ML SOLN Take 3 mLs by nebulization every 4 (four) hours as needed. 360 mL 0   lidocaine-prilocaine (EMLA) cream Apply to affected area once 30 g 3   lisinopril-hydrochlorothiazide (ZESTORETIC) 20-12.5 MG tablet Take 1 tablet by mouth daily.     meloxicam (MOBIC) 15 MG tablet Take 15 mg by mouth daily.     Multiple Vitamin (MULTI-VITAMIN) tablet Take 1 tablet by mouth daily.     potassium chloride (KLOR-CON) 10 MEQ tablet Take 10 mEq by mouth daily.     potassium chloride SA (KLOR-CON M) 20 MEQ tablet Take 1 tablet (20 mEq total) by mouth 2 (two) times daily. 60 tablet 1   propranolol (INDERAL) 10 MG tablet Take 0.5 tablets (5 mg total) by mouth 2 (two) times daily. 60 tablet 0   simvastatin (ZOCOR) 20 MG tablet Take 20 mg by mouth at bedtime.     sodium chloride 1 g tablet Take 1 tablet (1 g total) by mouth 3 (three) times daily. 30 tablet 2   Vitamin E 400 units TABS Take 400 Units by mouth daily.     No current facility-administered medications for this visit.    OBJECTIVE: Vitals:   12/25/22 0851  BP: (!) 141/87  Pulse: 88  Resp: 16  Temp: (!) 97 F (36.1 C)  SpO2: 100%     Body mass index is 27.76 kg/m.    ECOG FS:1 - Symptomatic but completely ambulatory  General: Well-developed, well-nourished, no acute distress.  Sitting in a wheelchair. Eyes: Pink conjunctiva, anicteric sclera. HEENT: Normocephalic, moist mucous membranes. Lungs: No audible wheezing or coughing. Heart: Regular rate and rhythm. Abdomen: Soft, nontender, no obvious distention. Musculoskeletal: No edema, cyanosis, or clubbing. Neuro: Alert, answering all questions appropriately. Cranial nerves grossly intact. Skin: No rashes or petechiae noted. Psych: Normal affect.   LAB RESULTS:  Lab Results  Component Value Date   NA 127 (L)  12/09/2022   K 4.2 12/09/2022   CL 89 (L) 12/09/2022   CO2 29 12/09/2022   GLUCOSE 108 (H) 12/09/2022   BUN 9 12/09/2022   CREATININE 0.47 (L) 12/09/2022   CALCIUM 8.0 (L) 12/09/2022   PROT 5.9 (L) 12/09/2022   ALBUMIN 3.5 12/09/2022   AST 26 12/09/2022   ALT 22 12/09/2022   ALKPHOS 203 (H) 12/09/2022   BILITOT 0.5 12/09/2022   GFRNONAA >60 12/09/2022    Lab Results  Component Value Date   WBC 9.9 12/25/2022   NEUTROABS 5.9 12/25/2022   HGB 6.7 (LL) 12/25/2022   HCT 20.9 (L) 12/25/2022   MCV 88.6 12/25/2022   PLT 421 (H) 12/25/2022     STUDIES: US Venous Img Lower Bilateral  Result Date: 12/08/2022 CLINICAL DATA:  exclude dvts due to  new edema (cancer patient) EXAM: BILATERAL LOWER EXTREMITY VENOUS DOPPLER ULTRASOUND TECHNIQUE: Gray-scale sonography with compression, as well as color and duplex ultrasound, were performed to evaluate the deep venous system(s) from the level of the common femoral vein through the popliteal and proximal calf veins. COMPARISON:  None Available. FINDINGS: VENOUS Normal compressibility of the bilateral common femoral, superficial femoral, and popliteal veins, as well as the visualized calf veins. Visualized portions of profunda femoral vein and great saphenous vein unremarkable. No filling defects to suggest DVT on grayscale or color Doppler imaging. Doppler waveforms show normal direction of venous flow, normal respiratory plasticity and response to augmentation. OTHER There is subcutaneous soft tissue swelling right calf. Limitations: none IMPRESSION: No evidence of DVT in either lower extremity. Subcutaneous soft tissue swelling along the right calf noted. Electronically Signed   By: Maurine Simmering M.D.   On: 12/08/2022 12:33   DG Chest 2 View  Result Date: 12/08/2022 CLINICAL DATA:  Bilateral leg and feet swelling. Pt states he noticed it this am. Pt states he does wear 4L o2 all the time. Pt does have hx of HTN and COPD. edema, legs, eval for volume  overload EXAM: CHEST - 2 VIEW COMPARISON:  11/11/2022 FINDINGS: Port in the anterior chest wall with tip in distal SVC. Normal mediastinum and cardiac silhouette. Normal pulmonary vasculature. No evidence of effusion, infiltrate, or pneumothorax. No acute bony abnormality. IMPRESSION: No acute cardiopulmonary process. Electronically Signed   By: Suzy Bouchard M.D.   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  Biopsy confirmed small cell lung carcinoma.  Patient could not tolerate MRI, but CT of the brain on October 11, 2022 is negative.  PET scan results from November 04, 2022 reviewed independently and report as above with widespread bony metastasis. Patient has completed palliative XRT given his symptoms and encasement of pulmonary vessels.   Proceed with cycle 3, day 1 of cisplatin, etoposide, and Tecentriq.  Return to clinic in 1 and 2 days for etoposide only.  Patient will then return to clinic next Monday for your Udenyca.  Return to clinic in 1 week for laboratory work and in 3 weeks for further evaluation and consideration of cycle 4.  Plan to reimage after cycle 4.  Cycle 5 and beyond will be maintenance Tecentriq.  Shortness of breath.  Chronic and unchanged. Bone lesions: Continue Zometa with odd-numbered cycles. Hyponatremia: Sodium has improved to 131.  Continue salt tablets as prescribed. Hypokalemia: Resolved.  Continue oral potassium supplementation. Pain: Improved.  Patient has been instructed to take his hydrocodone as prescribed.  Appreciate palliative care input. Leukocytosis: Resolved.  Continue Udenyca as above. Anemia: Proceed with treatment as above.  Patient will return to  clinic on Friday to receive 2 units packed red blood cells.  Patient expressed understanding and was in agreement with this plan. He also understands that He can call clinic at any time with any questions, concerns, or complaints.    Cancer Staging  Small cell lung cancer Staging form: Lung, AJCC 8th Edition - Clinical: Stage IVA (cT4, cN2, cM1a) - Signed by Lloyd Huger, MD on 10/29/2022   Lloyd Huger, MD  12/25/2022 9:42 AM

## 2022-12-25 NOTE — Progress Notes (Signed)
Per MD proceed with chemo with Hgb 6.7 patient will be scheduled for blood this week

## 2022-12-26 ENCOUNTER — Inpatient Hospital Stay: Payer: Medicare Other

## 2022-12-26 ENCOUNTER — Encounter: Payer: Self-pay | Admitting: Oncology

## 2022-12-26 ENCOUNTER — Ambulatory Visit: Payer: Medicare Other

## 2022-12-26 ENCOUNTER — Ambulatory Visit

## 2022-12-26 VITALS — BP 129/80 | HR 88 | Temp 97.8°F | Resp 18

## 2022-12-26 DIAGNOSIS — C3490 Malignant neoplasm of unspecified part of unspecified bronchus or lung: Secondary | ICD-10-CM

## 2022-12-26 DIAGNOSIS — Z5111 Encounter for antineoplastic chemotherapy: Secondary | ICD-10-CM | POA: Diagnosis not present

## 2022-12-26 LAB — T4: T4, Total: 7 ug/dL (ref 4.5–12.0)

## 2022-12-26 MED ORDER — HEPARIN SOD (PORK) LOCK FLUSH 100 UNIT/ML IV SOLN
500.0000 [IU] | Freq: Once | INTRAVENOUS | Status: AC | PRN
  Administered 2022-12-26: 500 [IU]
  Filled 2022-12-26: qty 5

## 2022-12-26 MED ORDER — SODIUM CHLORIDE 0.9 % IV SOLN
Freq: Once | INTRAVENOUS | Status: AC
  Filled 2022-12-26: qty 250

## 2022-12-26 MED ORDER — SODIUM CHLORIDE 0.9 % IV SOLN
100.0000 mg/m2 | Freq: Once | INTRAVENOUS | Status: AC
  Administered 2022-12-26: 200 mg via INTRAVENOUS
  Filled 2022-12-26: qty 10

## 2022-12-26 MED ORDER — SODIUM CHLORIDE 0.9 % IV SOLN
10.0000 mg | Freq: Once | INTRAVENOUS | Status: AC
  Administered 2022-12-26: 10 mg via INTRAVENOUS
  Filled 2022-12-26: qty 10

## 2022-12-26 MED ORDER — SODIUM CHLORIDE 0.9% FLUSH
10.0000 mL | INTRAVENOUS | Status: DC | PRN
  Administered 2022-12-26: 10 mL
  Filled 2022-12-26: qty 10

## 2022-12-26 MED FILL — Dexamethasone Sodium Phosphate Inj 100 MG/10ML: INTRAMUSCULAR | Qty: 1 | Status: AC

## 2022-12-27 ENCOUNTER — Ambulatory Visit
Admission: RE | Admit: 2022-12-27 | Discharge: 2022-12-27 | Disposition: A | Discharge status: Home / Self Care | Payer: Medicare Other | Source: Ambulatory Visit | Attending: Oncology | Admitting: Oncology

## 2022-12-27 ENCOUNTER — Inpatient Hospital Stay: Payer: Medicare Other

## 2022-12-27 ENCOUNTER — Ambulatory Visit

## 2022-12-27 VITALS — BP 154/79 | HR 93 | Temp 97.9°F | Resp 18

## 2022-12-27 DIAGNOSIS — C3490 Malignant neoplasm of unspecified part of unspecified bronchus or lung: Secondary | ICD-10-CM

## 2022-12-27 DIAGNOSIS — Z5111 Encounter for antineoplastic chemotherapy: Secondary | ICD-10-CM | POA: Diagnosis not present

## 2022-12-27 MED ORDER — SODIUM CHLORIDE 0.9% FLUSH: 10.0000 mL | INTRAVENOUS | Status: DC | PRN

## 2022-12-27 MED ORDER — HEPARIN SOD (PORK) LOCK FLUSH 100 UNIT/ML IV SOLN: 500.0000 [IU] | Freq: Every day | INTRAVENOUS | Status: DC | PRN

## 2022-12-27 MED ORDER — SODIUM CHLORIDE 0.9% FLUSH
10.0000 mL | INTRAVENOUS | Status: DC | PRN
  Administered 2022-12-27: 10 mL
  Filled 2022-12-27: qty 10

## 2022-12-27 MED ORDER — HEPARIN SOD (PORK) LOCK FLUSH 100 UNIT/ML IV SOLN: 250.0000 [IU] | INTRAVENOUS | Status: DC | PRN

## 2022-12-27 MED ORDER — ACETAMINOPHEN 325 MG PO TABS
650.0000 mg | ORAL_TABLET | Freq: Once | ORAL | Status: AC
  Administered 2022-12-27: 650 mg via ORAL

## 2022-12-27 MED ORDER — SODIUM CHLORIDE 0.9% FLUSH: 3.0000 mL | INTRAVENOUS | Status: DC | PRN

## 2022-12-27 MED ORDER — SODIUM CHLORIDE 0.9 % IV SOLN
10.0000 mg | Freq: Once | INTRAVENOUS | Status: AC
  Administered 2022-12-27: 10 mg via INTRAVENOUS
  Filled 2022-12-27: qty 10

## 2022-12-27 MED ORDER — SODIUM CHLORIDE 0.9 % IV SOLN
100.0000 mg/m2 | Freq: Once | INTRAVENOUS | Status: AC
  Administered 2022-12-27: 200 mg via INTRAVENOUS
  Filled 2022-12-27: qty 10

## 2022-12-27 MED ORDER — HEPARIN SOD (PORK) LOCK FLUSH 100 UNIT/ML IV SOLN
500.0000 [IU] | Freq: Once | INTRAVENOUS | Status: AC | PRN
  Administered 2022-12-27: 500 [IU]
  Filled 2022-12-27: qty 5

## 2022-12-27 MED ORDER — SODIUM CHLORIDE 0.9% IV SOLUTION
250.0000 mL | Freq: Once | INTRAVENOUS | Status: AC
  Administered 2022-12-27: 250 mL via INTRAVENOUS

## 2022-12-27 MED ORDER — DIPHENHYDRAMINE HCL 50 MG/ML IJ SOLN
INTRAMUSCULAR | Status: AC
  Filled 2022-12-27: qty 1

## 2022-12-27 MED ORDER — SODIUM CHLORIDE 0.9 % IV SOLN
Freq: Once | INTRAVENOUS | Status: AC
  Filled 2022-12-27: qty 250

## 2022-12-27 MED ORDER — ACETAMINOPHEN 325 MG PO TABS
ORAL_TABLET | ORAL | Status: AC
  Filled 2022-12-27: qty 2

## 2022-12-27 MED ORDER — DIPHENHYDRAMINE HCL 50 MG/ML IJ SOLN
25.0000 mg | Freq: Once | INTRAMUSCULAR | Status: AC
  Administered 2022-12-27: 25 mg via INTRAVENOUS

## 2022-12-27 NOTE — Patient Instructions (Signed)
[facility]  Discharge Instructions: Thank you for choosing [facility] to provide your oncology and hematology care.  If you have a lab appointment with the Cancer Center, please go directly to the Cancer Center and check in at the registration area.  Wear comfortable clothing and clothing appropriate for easy access to any Portacath or PICC line.   We strive to give you quality time with your provider. You may need to reschedule your appointment if you arrive late (15 or more minutes).  Arriving late affects you and other patients whose appointments are after yours.  Also, if you miss three or more appointments without notifying the office, you may be dismissed from the clinic at the provider's discretion.      For prescription refill requests, have your pharmacy contact our office and allow 72 hours for refills to be completed.    Today you received the following chemotherapy and/or immunotherapy agents Etoposide      To help prevent nausea and vomiting after your treatment, we encourage you to take your nausea medication as directed.  BELOW ARE SYMPTOMS THAT SHOULD BE REPORTED IMMEDIATELY: *FEVER GREATER THAN 100.4 F (38 C) OR HIGHER *CHILLS OR SWEATING *NAUSEA AND VOMITING THAT IS NOT CONTROLLED WITH YOUR NAUSEA MEDICATION *UNUSUAL SHORTNESS OF BREATH *UNUSUAL BRUISING OR BLEEDING *URINARY PROBLEMS (pain or burning when urinating, or frequent urination) *BOWEL PROBLEMS (unusual diarrhea, constipation, pain near the anus) TENDERNESS IN MOUTH AND THROAT WITH OR WITHOUT PRESENCE OF ULCERS (sore throat, sores in mouth, or a toothache) UNUSUAL RASH, SWELLING OR PAIN  UNUSUAL VAGINAL DISCHARGE OR ITCHING   Items with * indicate a potential emergency and should be followed up as soon as possible or go to the Emergency Department if any problems should occur.  Please show the CHEMOTHERAPY ALERT CARD or IMMUNOTHERAPY ALERT CARD at check-in to  the Emergency Department and triage nurse.  Should you have questions after your visit or need to cancel or reschedule your appointment, please contact [facility]  [phone] and follow the prompts.  Office hours are 8:00 a.m. to 4:30 p.m. Monday - Friday. Please note that voicemails left after 4:00 p.m. may not be returned until the following business day.  We are closed weekends and major holidays. You have access to a nurse at all times for urgent questions. Please call the main number to the clinic [phone] and follow the prompts.  For any non-urgent questions, you may also contact your provider using MyChart. We now offer e-Visits for anyone 18 and older to request care online for non-urgent symptoms. For details visit [email or web address].   Also download the MyChart app! Go to the app store, search "MyChart", open the app, select , and log in with your MyChart username and password.    

## 2022-12-27 NOTE — Progress Notes (Signed)
Patient tolerated his blood transfusions well.  VSS, no reactions and no pain.  Per Dr. Orlie Dakin we accessed his port for the transfusions and we also left the port accessed when he left since he was going directly over to the cancer center for a chemo treatment.  Patient left via wheelchair escorted by his sister.

## 2022-12-28 LAB — BPAM RBC
Blood Product Expiration Date: 202404302359
Blood Product Expiration Date: 202404302359
ISSUE DATE / TIME: 202404050809
ISSUE DATE / TIME: 202404051044
Unit Type and Rh: 6200
Unit Type and Rh: 6200

## 2022-12-28 LAB — TYPE AND SCREEN
ABO/RH(D): A POS
Antibody Screen: NEGATIVE
Unit division: 0
Unit division: 0

## 2022-12-30 ENCOUNTER — Inpatient Hospital Stay: Payer: Medicare Other

## 2022-12-30 DIAGNOSIS — C3490 Malignant neoplasm of unspecified part of unspecified bronchus or lung: Secondary | ICD-10-CM

## 2022-12-30 DIAGNOSIS — Z5111 Encounter for antineoplastic chemotherapy: Secondary | ICD-10-CM | POA: Diagnosis not present

## 2022-12-30 MED ORDER — PEGFILGRASTIM-CBQV 6 MG/0.6ML ~~LOC~~ SOSY
6.0000 mg | PREFILLED_SYRINGE | Freq: Once | SUBCUTANEOUS | Status: AC
  Administered 2022-12-30: 6 mg via SUBCUTANEOUS
  Filled 2022-12-30: qty 0.6

## 2023-01-14 MED FILL — Fosaprepitant Dimeglumine For IV Infusion 150 MG (Base Eq): INTRAVENOUS | Qty: 5 | Status: AC

## 2023-01-14 MED FILL — Dexamethasone Sodium Phosphate Inj 100 MG/10ML: INTRAMUSCULAR | Qty: 1 | Status: AC

## 2023-01-15 ENCOUNTER — Inpatient Hospital Stay (HOSPITAL_BASED_OUTPATIENT_CLINIC_OR_DEPARTMENT_OTHER): Payer: Medicare Other | Admitting: Oncology

## 2023-01-15 ENCOUNTER — Inpatient Hospital Stay: Payer: Medicare Other

## 2023-01-15 ENCOUNTER — Encounter: Payer: Self-pay | Admitting: Oncology

## 2023-01-15 ENCOUNTER — Telehealth: Payer: Self-pay

## 2023-01-15 ENCOUNTER — Other Ambulatory Visit: Payer: Self-pay

## 2023-01-15 DIAGNOSIS — C3490 Malignant neoplasm of unspecified part of unspecified bronchus or lung: Secondary | ICD-10-CM

## 2023-01-15 DIAGNOSIS — Z5111 Encounter for antineoplastic chemotherapy: Secondary | ICD-10-CM | POA: Diagnosis not present

## 2023-01-15 LAB — CBC WITH DIFFERENTIAL/PLATELET
Abs Immature Granulocytes: 0.28 10*3/uL — ABNORMAL HIGH (ref 0.00–0.07)
Basophils Absolute: 0 10*3/uL (ref 0.0–0.1)
Basophils Relative: 0 %
Eosinophils Absolute: 0 10*3/uL (ref 0.0–0.5)
Eosinophils Relative: 0 %
HCT: 23.7 % — ABNORMAL LOW (ref 39.0–52.0)
Hemoglobin: 7.9 g/dL — ABNORMAL LOW (ref 13.0–17.0)
Immature Granulocytes: 3 %
Lymphocytes Relative: 16 %
Lymphs Abs: 1.5 10*3/uL (ref 0.7–4.0)
MCH: 30.9 pg (ref 26.0–34.0)
MCHC: 33.3 g/dL (ref 30.0–36.0)
MCV: 92.6 fL (ref 80.0–100.0)
Monocytes Absolute: 1.2 10*3/uL — ABNORMAL HIGH (ref 0.1–1.0)
Monocytes Relative: 12 %
Neutro Abs: 6.4 10*3/uL (ref 1.7–7.7)
Neutrophils Relative %: 69 %
Platelets: 293 10*3/uL (ref 150–400)
RBC: 2.56 MIL/uL — ABNORMAL LOW (ref 4.22–5.81)
RDW: 20.4 % — ABNORMAL HIGH (ref 11.5–15.5)
WBC: 9.4 10*3/uL (ref 4.0–10.5)
nRBC: 0.4 % — ABNORMAL HIGH (ref 0.0–0.2)

## 2023-01-15 LAB — COMPREHENSIVE METABOLIC PANEL
ALT: 11 U/L (ref 0–44)
AST: 19 U/L (ref 15–41)
Albumin: 3.5 g/dL (ref 3.5–5.0)
Alkaline Phosphatase: 101 U/L (ref 38–126)
Anion gap: 8 (ref 5–15)
BUN: 9 mg/dL (ref 6–20)
CO2: 27 mmol/L (ref 22–32)
Calcium: 8.8 mg/dL — ABNORMAL LOW (ref 8.9–10.3)
Chloride: 99 mmol/L (ref 98–111)
Creatinine, Ser: 0.55 mg/dL — ABNORMAL LOW (ref 0.61–1.24)
GFR, Estimated: >60 mL/min (ref >60)
Glucose, Bld: 108 mg/dL — ABNORMAL HIGH (ref 70–99)
Potassium: 3.6 mmol/L (ref 3.5–5.1)
Sodium: 134 mmol/L — ABNORMAL LOW (ref 135–145)
Total Bilirubin: 0.4 mg/dL (ref 0.3–1.2)
Total Protein: 6.3 g/dL — ABNORMAL LOW (ref 6.5–8.1)

## 2023-01-15 LAB — TYPE AND SCREEN

## 2023-01-15 LAB — PREPARE RBC (CROSSMATCH)

## 2023-01-15 MED ORDER — PALONOSETRON HCL INJECTION 0.25 MG/5ML
0.2500 mg | Freq: Once | INTRAVENOUS | Status: AC
  Administered 2023-01-15: 0.25 mg via INTRAVENOUS
  Filled 2023-01-15: qty 5

## 2023-01-15 MED ORDER — SODIUM CHLORIDE 0.9 % IV SOLN
10.0000 mg | Freq: Once | INTRAVENOUS | Status: AC
  Administered 2023-01-15: 10 mg via INTRAVENOUS
  Filled 2023-01-15: qty 10

## 2023-01-15 MED ORDER — SODIUM CHLORIDE 0.9 % IV SOLN
1200.0000 mg | Freq: Once | INTRAVENOUS | Status: AC
  Administered 2023-01-15: 1200 mg via INTRAVENOUS
  Filled 2023-01-15: qty 20

## 2023-01-15 MED ORDER — SODIUM CHLORIDE 0.9 % IV SOLN
100.0000 mg/m2 | Freq: Once | INTRAVENOUS | Status: AC
  Administered 2023-01-15: 200 mg via INTRAVENOUS
  Filled 2023-01-15: qty 10

## 2023-01-15 MED ORDER — HEPARIN SOD (PORK) LOCK FLUSH 100 UNIT/ML IV SOLN
500.0000 [IU] | Freq: Once | INTRAVENOUS | Status: AC | PRN
  Administered 2023-01-15: 500 [IU]
  Filled 2023-01-15: qty 5

## 2023-01-15 MED ORDER — SODIUM CHLORIDE 0.9 % IV SOLN
Freq: Once | INTRAVENOUS | Status: AC
  Filled 2023-01-15: qty 250

## 2023-01-15 MED ORDER — SODIUM CHLORIDE 0.9 % IV SOLN
150.0000 mg | Freq: Once | INTRAVENOUS | Status: AC
  Administered 2023-01-15: 150 mg via INTRAVENOUS
  Filled 2023-01-15: qty 150

## 2023-01-15 MED ORDER — SODIUM CHLORIDE 0.9 % IV SOLN
748.5000 mg | Freq: Once | INTRAVENOUS | Status: AC
  Administered 2023-01-15: 750 mg via INTRAVENOUS
  Filled 2023-01-15: qty 75

## 2023-01-15 MED FILL — Dexamethasone Sodium Phosphate Inj 100 MG/10ML: INTRAMUSCULAR | Qty: 1 | Status: AC

## 2023-01-15 NOTE — Telephone Encounter (Signed)
Referral faxed to [facility] clinic pulmonary.

## 2023-01-15 NOTE — Patient Instructions (Signed)

## 2023-01-15 NOTE — Progress Notes (Signed)
Hgb 7.9 ok to proceed per MD

## 2023-01-15 NOTE — Progress Notes (Signed)
Does not have pulmonary doctor per sister. She was wondering if he will need a referral. He is on oxygen and has had increased SOB and has needed to increase to 4 liters. Also mentioned head itching since hair has fallen out with treatment. Wanted to know if there were any recommendations to help with itching.

## 2023-01-15 NOTE — Progress Notes (Signed)
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  INTERVAL HISTORY: Patient returns to clinic today for further evaluation and consideration of cycle 4, day 1 of cisplatin and etoposide.  He continues to have chronic weakness and fatigue, but otherwise feels well and is tolerating his treatments without significant side effects.  He continues to have chronic cough and shortness of breath.  He does not complain of pain today.  He has no neurologic complaints.  He denies any recent fevers.  He has a good appetite and denies weight loss.  He has no chest pain or hemoptysis.  He has no nausea, vomiting, constipation, or diarrhea.  He has no urinary complaints.  Patient offers no further specific complaints today.  REVIEW OF SYSTEMS:   Review of Systems  Constitutional:  Positive for malaise/fatigue. Negative for fever and weight loss.  Respiratory:  Positive for cough and shortness of breath. Negative for hemoptysis.   Cardiovascular: Negative.  Negative for chest pain and leg swelling.  Gastrointestinal: Negative.  Negative for abdominal pain and nausea.  Genitourinary: Negative.  Negative for dysuria.  Musculoskeletal: Negative.  Negative for back pain and joint pain.  Skin: Negative.  Negative for rash.  Neurological:  Positive for weakness. Negative for dizziness, focal weakness and headaches.  Psychiatric/Behavioral: Negative.  The patient is not nervous/anxious.     As per HPI. Otherwise, a complete review of systems is negative.  PAST MEDICAL HISTORY: Past Medical History:  Diagnosis Date   Anxiety    Arthritis    Emphysema of lung    HTN (hypertension)    Hyperlipemia    Schizoaffective disorder, bipolar  type    Substance abuse     PAST SURGICAL HISTORY: Past Surgical History:  Procedure Laterality Date   FINGER SURGERY     IR IMAGING GUIDED PORT INSERTION  11/04/2022   VIDEO BRONCHOSCOPY WITH ENDOBRONCHIAL ULTRASOUND Left 10/11/2022   Procedure: VIDEO BRONCHOSCOPY WITH ENDOBRONCHIAL ULTRASOUND;  Surgeon: Raechel Chute, MD;  Location: [facility] ORS;  Service: Pulmonary;  Laterality: Left;    FAMILY HISTORY: Family History  Problem Relation Age of Onset   Stroke Father    Seizures Father    Stroke Sister    Cancer Maternal Grandmother     ADVANCED DIRECTIVES (Y/N):  N  HEALTH MAINTENANCE: Social History   Tobacco Use   Smoking status: Former    Packs/day: 0.50    Years: 40.00    Additional pack years: 0.00    Total pack years: 20.00    Types: Cigarettes    Quit date: 10/15/2022    Years since quitting: 0.2   Smokeless tobacco: Former    Quit date: 10/15/2022  Substance Use Topics   Alcohol use: No   Drug use: No     Colonoscopy:  PAP:  Bone density:  Lipid panel:  No Known Allergies  Current Outpatient Medications  Medication Sig Dispense Refill   albuterol (VENTOLIN HFA) 108 (90 Base) MCG/ACT inhaler Inhale 2 puffs into the lungs every 6 (six) hours as needed for wheezing or shortness of breath.     benztropine (COGENTIN) 2 MG tablet Take 2 mg by mouth 2 (two) times daily.     bisacodyl (DULCOLAX) 5 MG EC tablet Take 1 tablet (5  mg total) by mouth daily as needed for moderate constipation. 30 tablet 0   dexamethasone (DECADRON) 4 MG tablet Take 1 tablet (4 mg total) by mouth daily. 30 tablet 1   docusate sodium (COLACE) 100 MG capsule Take 1 capsule (100 mg total) by mouth 2 (two) times daily as needed for mild constipation. 10 capsule 0   fluPHENAZine (PROLIXIN) 5 MG tablet Take 2.5 mg by mouth daily.     guaiFENesin (MUCINEX) 600 MG 12 hr tablet Take 1 tablet (600 mg total) by mouth 2 (two) times daily as needed for cough. 30 tablet 0   HYDROcodone-acetaminophen  (NORCO/VICODIN) 5-325 MG tablet Take 1-2 tablets by mouth every 6 (six) hours as needed for moderate pain. 30 tablet 0   ipratropium-albuterol (DUONEB) 0.5-2.5 (3) MG/3ML SOLN Take 3 mLs by nebulization every 4 (four) hours as needed. 360 mL 0   lidocaine-prilocaine (EMLA) cream Apply to affected area once 30 g 3   lisinopril-hydrochlorothiazide (ZESTORETIC) 20-12.5 MG tablet Take 1 tablet by mouth daily.     meloxicam (MOBIC) 15 MG tablet Take 15 mg by mouth daily.     Multiple Vitamin (MULTI-VITAMIN) tablet Take 1 tablet by mouth daily.     potassium chloride (KLOR-CON) 10 MEQ tablet Take 10 mEq by mouth daily.     potassium chloride SA (KLOR-CON M) 20 MEQ tablet Take 1 tablet (20 mEq total) by mouth 2 (two) times daily. 60 tablet 1   propranolol (INDERAL) 10 MG tablet Take 0.5 tablets (5 mg total) by mouth 2 (two) times daily. 60 tablet 0   simvastatin (ZOCOR) 20 MG tablet Take 20 mg by mouth at bedtime.     sodium chloride 1 g tablet Take 1 tablet (1 g total) by mouth 3 (three) times daily. 30 tablet 2   Vitamin E 400 units TABS Take 400 Units by mouth daily.     No current facility-administered medications for this visit.   Facility-Administered Medications Ordered in Other Visits  Medication Dose Route Frequency Provider Last Rate Last Admin   atezolizumab (TECENTRIQ) 1,200 mg in sodium chloride 0.9 % 250 mL chemo infusion  1,200 mg Intravenous Once Clayden Withem, Tollie Pizza, MD       CARBOplatin (PARAPLATIN) 750 mg in sodium chloride 0.9 % 250 mL chemo infusion  750 mg Intravenous Once Jeralyn Ruths, MD       etoposide (VEPESID) 200 mg in sodium chloride 0.9 % 500 mL chemo infusion  100 mg/m2 (Order-Specific) Intravenous Once Jeralyn Ruths, MD       fosaprepitant (EMEND) 150 mg in sodium chloride 0.9 % 145 mL IVPB  150 mg Intravenous Once Jeralyn Ruths, MD 450 mL/hr at 01/15/23 0959 150 mg at 01/15/23 0959   heparin lock flush 100 unit/mL  500 Units Intracatheter Once PRN  Jeralyn Ruths, MD        OBJECTIVE: Vitals:   01/15/23 0839  BP: (!) 156/88  Pulse: 74  Resp: 18  Temp: (!) 96.5 F (35.8 C)  SpO2: 100%     Body mass index is 28.13 kg/m.    ECOG FS:1 - Symptomatic but completely ambulatory  General: Well-developed, well-nourished, no acute distress.  Sitting in wheelchair. Eyes: Pink conjunctiva, anicteric sclera. HEENT: Normocephalic, moist mucous membranes. Lungs: No audible wheezing or coughing. Heart: Regular rate and rhythm. Abdomen: Soft, nontender, no obvious distention. Musculoskeletal: No edema, cyanosis, or clubbing. Neuro: Alert, answering all questions appropriately. Cranial nerves grossly intact. Skin: No rashes or petechiae noted. Psych:  Normal affect.  LAB RESULTS:  Lab Results  Component Value Date   NA 134 (L) 01/15/2023   K 3.6 01/15/2023   CL 99 01/15/2023   CO2 27 01/15/2023   GLUCOSE 108 (H) 01/15/2023   BUN 9 01/15/2023   CREATININE 0.55 (L) 01/15/2023   CALCIUM 8.8 (L) 01/15/2023   PROT 6.3 (L) 01/15/2023   ALBUMIN 3.5 01/15/2023   AST 19 01/15/2023   ALT 11 01/15/2023   ALKPHOS 101 01/15/2023   BILITOT 0.4 01/15/2023   GFRNONAA >60 01/15/2023    Lab Results  Component Value Date   WBC 9.4 01/15/2023   NEUTROABS 6.4 01/15/2023   HGB 7.9 (L) 01/15/2023   HCT 23.7 (L) 01/15/2023   MCV 92.6 01/15/2023   PLT 293 01/15/2023     STUDIES: No results found.  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  Biopsy confirmed small cell lung carcinoma.  Patient could not tolerate MRI, but CT of the brain on October 11, 2022 is negative.  PET scan results from November 04, 2022 reviewed independently with widespread bony  metastasis. Patient has completed palliative XRT given his symptoms and encasement of pulmonary vessels.  Proceed with cycle 4, day 1 of cisplatin, etoposide, and Tecentriq.  Return to clinic in 1 and 2 days for etoposide only.  He will then return to clinic next Monday for Udenyca.  Patient will also receive 1 unit packed red blood cells tomorrow.  Return to clinic in 3 weeks for further evaluation and continuation of treatment which will be maintenance Tecentriq.  Will get PET scan 2 to 3 days prior to next infusion.  Shortness of breath/cough: Chronic and unchanged.  Patient was given a referral to pulmonary. Bone lesions: Continue Zometa with odd-numbered cycles. Hyponatremia: Sodium continues to trend up and is now 134.  Continue salt tablets as prescribed. Hypokalemia: Resolved.  Continue oral potassium supplementation. Pain: Improved.  Patient has been instructed to take his hydrocodone as prescribed.  Appreciate palliative care input. Leukocytosis: Resolved.  Continue Udenyca as above. Anemia: Proceed with treatment as above.  Patient will receive 1 unit of packed red blood cells along with treatment tomorrow.     Patient expressed understanding and was in agreement with this plan. He also understands that He can call clinic at any time with any questions, concerns, or complaints.    Cancer Staging  Small cell lung cancer Staging form: Lung, AJCC 8th Edition - Clinical: Stage IVA (cT4, cN2, cM1a) - Signed by Jeralyn Ruths, MD on 10/29/2022   Jeralyn Ruths, MD   01/15/2023 10:15 AM

## 2023-01-16 ENCOUNTER — Inpatient Hospital Stay: Payer: Medicare Other

## 2023-01-16 VITALS — BP 132/77 | HR 72 | Temp 96.1°F | Resp 18

## 2023-01-16 DIAGNOSIS — Z5111 Encounter for antineoplastic chemotherapy: Secondary | ICD-10-CM | POA: Diagnosis not present

## 2023-01-16 DIAGNOSIS — C3490 Malignant neoplasm of unspecified part of unspecified bronchus or lung: Secondary | ICD-10-CM

## 2023-01-16 LAB — TYPE AND SCREEN: ABO/RH(D): A POS

## 2023-01-16 LAB — BPAM RBC: Unit Type and Rh: 600

## 2023-01-16 MED ORDER — HEPARIN SOD (PORK) LOCK FLUSH 100 UNIT/ML IV SOLN
500.0000 [IU] | Freq: Once | INTRAVENOUS | Status: AC | PRN
  Administered 2023-01-16: 500 [IU]
  Filled 2023-01-16: qty 5

## 2023-01-16 MED ORDER — SODIUM CHLORIDE 0.9 % IV SOLN
100.0000 mg/m2 | Freq: Once | INTRAVENOUS | Status: AC
  Administered 2023-01-16: 200 mg via INTRAVENOUS
  Filled 2023-01-16: qty 10

## 2023-01-16 MED ORDER — ACETAMINOPHEN 325 MG PO TABS
650.0000 mg | ORAL_TABLET | Freq: Once | ORAL | Status: AC
  Administered 2023-01-16: 650 mg via ORAL
  Filled 2023-01-16: qty 2

## 2023-01-16 MED ORDER — SODIUM CHLORIDE 0.9 % IV SOLN
10.0000 mg | Freq: Once | INTRAVENOUS | Status: AC
  Administered 2023-01-16: 10 mg via INTRAVENOUS
  Filled 2023-01-16: qty 10

## 2023-01-16 MED ORDER — SODIUM CHLORIDE 0.9 % IV SOLN
Freq: Once | INTRAVENOUS | Status: AC
  Filled 2023-01-16: qty 250

## 2023-01-16 MED ORDER — DIPHENHYDRAMINE HCL 50 MG/ML IJ SOLN
25.0000 mg | Freq: Once | INTRAMUSCULAR | Status: AC
  Administered 2023-01-16: 25 mg via INTRAVENOUS
  Filled 2023-01-16: qty 1

## 2023-01-16 MED ORDER — SODIUM CHLORIDE 0.9% IV SOLUTION
250.0000 mL | Freq: Once | INTRAVENOUS | Status: AC
  Administered 2023-01-16: 250 mL via INTRAVENOUS
  Filled 2023-01-16: qty 250

## 2023-01-16 MED FILL — Dexamethasone Sodium Phosphate Inj 100 MG/10ML: INTRAMUSCULAR | Qty: 1 | Status: AC

## 2023-01-17 ENCOUNTER — Inpatient Hospital Stay: Payer: Medicare Other

## 2023-01-17 VITALS — BP 133/75 | HR 76 | Temp 97.2°F | Resp 16

## 2023-01-17 DIAGNOSIS — Z5111 Encounter for antineoplastic chemotherapy: Secondary | ICD-10-CM | POA: Diagnosis not present

## 2023-01-17 DIAGNOSIS — C3490 Malignant neoplasm of unspecified part of unspecified bronchus or lung: Secondary | ICD-10-CM

## 2023-01-17 LAB — TYPE AND SCREEN
Antibody Screen: NEGATIVE
Unit division: 0

## 2023-01-17 LAB — BPAM RBC
Blood Product Expiration Date: 202404302359
ISSUE DATE / TIME: 202404251020

## 2023-01-17 MED ORDER — SODIUM CHLORIDE 0.9 % IV SOLN
10.0000 mg | Freq: Once | INTRAVENOUS | Status: AC
  Administered 2023-01-17: 10 mg via INTRAVENOUS
  Filled 2023-01-17: qty 10

## 2023-01-17 MED ORDER — SODIUM CHLORIDE 0.9 % IV SOLN
Freq: Once | INTRAVENOUS | Status: AC
  Filled 2023-01-17: qty 250

## 2023-01-17 MED ORDER — SODIUM CHLORIDE 0.9 % IV SOLN
100.0000 mg/m2 | Freq: Once | INTRAVENOUS | Status: AC
  Administered 2023-01-17: 200 mg via INTRAVENOUS
  Filled 2023-01-17: qty 10

## 2023-01-17 NOTE — Patient Instructions (Signed)
[facility] [place] [facility]  Discharge Instructions: Thank you for choosing [facility] to provide your oncology and hematology care.  If you have a lab appointment with the Cancer Center, please go directly to the Cancer Center and check in at the registration area.  Wear comfortable clothing and clothing appropriate for easy access to any Portacath or PICC line.   We strive to give you quality time with your provider. You may need to reschedule your appointment if you arrive late (15 or more minutes).  Arriving late affects you and other patients whose appointments are after yours.  Also, if you miss three or more appointments without notifying the office, you may be dismissed from the clinic at the provider's discretion.      For prescription refill requests, have your pharmacy contact our office and allow 72 hours for refills to be completed.    Today you received the following chemotherapy and/or immunotherapy agents Etoposide      To help prevent nausea and vomiting after your treatment, we encourage you to take your nausea medication as directed.  BELOW ARE SYMPTOMS THAT SHOULD BE REPORTED IMMEDIATELY: *FEVER GREATER THAN 100.4 F (38 C) OR HIGHER *CHILLS OR SWEATING *NAUSEA AND VOMITING THAT IS NOT CONTROLLED WITH YOUR NAUSEA MEDICATION *UNUSUAL SHORTNESS OF BREATH *UNUSUAL BRUISING OR BLEEDING *URINARY PROBLEMS (pain or burning when urinating, or frequent urination) *BOWEL PROBLEMS (unusual diarrhea, constipation, pain near the anus) TENDERNESS IN MOUTH AND THROAT WITH OR WITHOUT PRESENCE OF ULCERS (sore throat, sores in mouth, or a toothache) UNUSUAL RASH, SWELLING OR PAIN  UNUSUAL VAGINAL DISCHARGE OR ITCHING   Items with * indicate a potential emergency and should be followed up as soon as possible or go to the Emergency Department if any problems should occur.  Please show the CHEMOTHERAPY ALERT CARD or IMMUNOTHERAPY ALERT CARD at check-in to  the Emergency Department and triage nurse.  Should you have questions after your visit or need to cancel or reschedule your appointment, please contact [facility] [place] [facility]  [phone] and follow the prompts.  Office hours are 8:00 a.m. to 4:30 p.m. Monday - Friday. Please note that voicemails left after 4:00 p.m. may not be returned until the following business day.  We are closed weekends and major holidays. You have access to a nurse at all times for urgent questions. Please call the main number to the clinic [phone] and follow the prompts.  For any non-urgent questions, you may also contact your provider using MyChart. We now offer e-Visits for anyone 18 and older to request care online for non-urgent symptoms. For details visit [email or web address].   Also download the MyChart app! Go to the app store, search "MyChart", open the app, select [place], and log in with your MyChart username and password.    

## 2023-01-20 ENCOUNTER — Inpatient Hospital Stay: Payer: Medicare Other

## 2023-01-20 DIAGNOSIS — C3490 Malignant neoplasm of unspecified part of unspecified bronchus or lung: Secondary | ICD-10-CM

## 2023-01-20 DIAGNOSIS — Z5111 Encounter for antineoplastic chemotherapy: Secondary | ICD-10-CM | POA: Diagnosis not present

## 2023-01-20 MED ORDER — PEGFILGRASTIM-CBQV 6 MG/0.6ML ~~LOC~~ SOSY
6.0000 mg | PREFILLED_SYRINGE | Freq: Once | SUBCUTANEOUS | Status: AC
  Administered 2023-01-20: 6 mg via SUBCUTANEOUS
  Filled 2023-01-20: qty 0.6

## 2023-01-24 NOTE — Telephone Encounter (Signed)
Made follow up call to make sure referral was received. They did receive referral and are currently working on it and will follow up with patient to get appointment scheduled.

## 2023-01-27 ENCOUNTER — Other Ambulatory Visit: Payer: Self-pay | Admitting: Oncology

## 2023-01-27 NOTE — Telephone Encounter (Signed)
  Component Ref Range & Units 12 d ago (01/15/23) 1 mo ago (12/25/22) 1 mo ago (12/09/22) 1 mo ago (12/08/22) 1 mo ago (12/04/22) 2 mo ago (11/19/22) 2 mo ago (11/13/22)  Potassium 3.5 - 5.1 mmol/L 3.6 3.6 4.2 4.1 3.0 Low  3.4 Low

## 2023-01-28 ENCOUNTER — Encounter: Payer: Self-pay | Admitting: Oncology

## 2023-02-03 ENCOUNTER — Ambulatory Visit
Admission: RE | Admit: 2023-02-03 | Discharge: 2023-02-03 | Disposition: A | Discharge status: Home / Self Care | Payer: Medicare Other | Source: Ambulatory Visit | Attending: Oncology | Admitting: Oncology

## 2023-02-03 DIAGNOSIS — J439 Emphysema, unspecified: Secondary | ICD-10-CM | POA: Diagnosis not present

## 2023-02-03 DIAGNOSIS — C3490 Malignant neoplasm of unspecified part of unspecified bronchus or lung: Secondary | ICD-10-CM | POA: Diagnosis present

## 2023-02-03 DIAGNOSIS — I700 Atherosclerosis of aorta: Secondary | ICD-10-CM | POA: Insufficient documentation

## 2023-02-03 DIAGNOSIS — K449 Diaphragmatic hernia without obstruction or gangrene: Secondary | ICD-10-CM | POA: Diagnosis not present

## 2023-02-03 DIAGNOSIS — R911 Solitary pulmonary nodule: Secondary | ICD-10-CM | POA: Diagnosis not present

## 2023-02-03 LAB — GLUCOSE, CAPILLARY: Glucose-Capillary: 83 mg/dL (ref 70–99)

## 2023-02-03 MED ORDER — FLUDEOXYGLUCOSE F - 18 (FDG) INJECTION
9.8000 | Freq: Once | INTRAVENOUS | Status: AC
  Administered 2023-02-03: 10.47 via INTRAVENOUS

## 2023-02-05 ENCOUNTER — Inpatient Hospital Stay (HOSPITAL_BASED_OUTPATIENT_CLINIC_OR_DEPARTMENT_OTHER): Payer: Medicare Other | Admitting: Oncology

## 2023-02-05 ENCOUNTER — Inpatient Hospital Stay: Payer: Medicare Other | Attending: Oncology

## 2023-02-05 ENCOUNTER — Encounter: Payer: Self-pay | Admitting: Oncology

## 2023-02-05 ENCOUNTER — Inpatient Hospital Stay: Payer: Medicare Other

## 2023-02-05 DIAGNOSIS — Z5112 Encounter for antineoplastic immunotherapy: Secondary | ICD-10-CM | POA: Diagnosis present

## 2023-02-05 DIAGNOSIS — E871 Hypo-osmolality and hyponatremia: Secondary | ICD-10-CM | POA: Diagnosis not present

## 2023-02-05 DIAGNOSIS — C3490 Malignant neoplasm of unspecified part of unspecified bronchus or lung: Secondary | ICD-10-CM

## 2023-02-05 DIAGNOSIS — D649 Anemia, unspecified: Secondary | ICD-10-CM | POA: Diagnosis not present

## 2023-02-05 DIAGNOSIS — C3432 Malignant neoplasm of lower lobe, left bronchus or lung: Secondary | ICD-10-CM | POA: Insufficient documentation

## 2023-02-05 DIAGNOSIS — Z87891 Personal history of nicotine dependence: Secondary | ICD-10-CM | POA: Insufficient documentation

## 2023-02-05 DIAGNOSIS — C7951 Secondary malignant neoplasm of bone: Secondary | ICD-10-CM | POA: Insufficient documentation

## 2023-02-05 DIAGNOSIS — J439 Emphysema, unspecified: Secondary | ICD-10-CM | POA: Insufficient documentation

## 2023-02-05 LAB — COMPREHENSIVE METABOLIC PANEL
ALT: 10 U/L (ref 0–44)
AST: 19 U/L (ref 15–41)
Albumin: 3.6 g/dL (ref 3.5–5.0)
Alkaline Phosphatase: 90 U/L (ref 38–126)
Anion gap: 9 (ref 5–15)
BUN: 8 mg/dL (ref 6–20)
CO2: 27 mmol/L (ref 22–32)
Calcium: 8.7 mg/dL — ABNORMAL LOW (ref 8.9–10.3)
Chloride: 96 mmol/L — ABNORMAL LOW (ref 98–111)
Creatinine, Ser: 0.58 mg/dL — ABNORMAL LOW (ref 0.61–1.24)
GFR, Estimated: >60 mL/min (ref >60)
Glucose, Bld: 113 mg/dL — ABNORMAL HIGH (ref 70–99)
Potassium: 3.8 mmol/L (ref 3.5–5.1)
Sodium: 132 mmol/L — ABNORMAL LOW (ref 135–145)
Total Bilirubin: 0.4 mg/dL (ref 0.3–1.2)
Total Protein: 6.2 g/dL — ABNORMAL LOW (ref 6.5–8.1)

## 2023-02-05 LAB — CBC WITH DIFFERENTIAL/PLATELET
Abs Immature Granulocytes: 0.25 10*3/uL — ABNORMAL HIGH (ref 0.00–0.07)
Basophils Absolute: 0 10*3/uL (ref 0.0–0.1)
Basophils Relative: 0 %
Eosinophils Absolute: 0.1 10*3/uL (ref 0.0–0.5)
Eosinophils Relative: 1 %
HCT: 20.8 % — ABNORMAL LOW (ref 39.0–52.0)
Hemoglobin: 6.8 g/dL — CL (ref 13.0–17.0)
Immature Granulocytes: 4 %
Lymphocytes Relative: 14 %
Lymphs Abs: 1 10*3/uL (ref 0.7–4.0)
MCH: 31.9 pg (ref 26.0–34.0)
MCHC: 32.7 g/dL (ref 30.0–36.0)
MCV: 97.7 fL (ref 80.0–100.0)
Monocytes Absolute: 0.9 10*3/uL (ref 0.1–1.0)
Monocytes Relative: 12 %
Neutro Abs: 4.8 10*3/uL (ref 1.7–7.7)
Neutrophils Relative %: 69 %
Platelets: 318 10*3/uL (ref 150–400)
RBC: 2.13 MIL/uL — ABNORMAL LOW (ref 4.22–5.81)
RDW: 18.6 % — ABNORMAL HIGH (ref 11.5–15.5)
WBC: 6.9 10*3/uL (ref 4.0–10.5)
nRBC: 0 % (ref 0.0–0.2)

## 2023-02-05 LAB — PREPARE RBC (CROSSMATCH)

## 2023-02-05 LAB — TYPE AND SCREEN: ABO/RH(D): A POS

## 2023-02-05 LAB — BPAM RBC: Unit Type and Rh: 600

## 2023-02-05 MED ORDER — SODIUM CHLORIDE 0.9 % IV SOLN
1200.0000 mg | Freq: Once | INTRAVENOUS | Status: AC
  Administered 2023-02-05: 1200 mg via INTRAVENOUS
  Filled 2023-02-05: qty 20

## 2023-02-05 MED ORDER — HEPARIN SOD (PORK) LOCK FLUSH 100 UNIT/ML IV SOLN
500.0000 [IU] | Freq: Once | INTRAVENOUS | Status: AC | PRN
  Administered 2023-02-05: 500 [IU]
  Filled 2023-02-05: qty 5

## 2023-02-05 MED ORDER — SODIUM CHLORIDE 0.9 % IV SOLN
Freq: Once | INTRAVENOUS | Status: AC
  Filled 2023-02-05: qty 250

## 2023-02-05 MED ORDER — SODIUM CHLORIDE 0.9% FLUSH
10.0000 mL | INTRAVENOUS | Status: DC | PRN
  Administered 2023-02-05: 10 mL
  Filled 2023-02-05: qty 10

## 2023-02-05 NOTE — Patient Instructions (Signed)
[facility] [place] [facility]  Discharge Instructions: Thank you for choosing [facility] to provide your oncology and hematology care.  If you have a lab appointment with the Cancer Center, please go directly to the Cancer Center and check in at the registration area.  Wear comfortable clothing and clothing appropriate for easy access to any Portacath or PICC line.   We strive to give you quality time with your provider. You may need to reschedule your appointment if you arrive late (15 or more minutes).  Arriving late affects you and other patients whose appointments are after yours.  Also, if you miss three or more appointments without notifying the office, you may be dismissed from the clinic at the provider's discretion.      For prescription refill requests, have your pharmacy contact our office and allow 72 hours for refills to be completed.    Today you received the following chemotherapy and/or immunotherapy agents : atezolizumab     To help prevent nausea and vomiting after your treatment, we encourage you to take your nausea medication as directed.  BELOW ARE SYMPTOMS THAT SHOULD BE REPORTED IMMEDIATELY: *FEVER GREATER THAN 100.4 F (38 C) OR HIGHER *CHILLS OR SWEATING *NAUSEA AND VOMITING THAT IS NOT CONTROLLED WITH YOUR NAUSEA MEDICATION *UNUSUAL SHORTNESS OF BREATH *UNUSUAL BRUISING OR BLEEDING *URINARY PROBLEMS (pain or burning when urinating, or frequent urination) *BOWEL PROBLEMS (unusual diarrhea, constipation, pain near the anus) TENDERNESS IN MOUTH AND THROAT WITH OR WITHOUT PRESENCE OF ULCERS (sore throat, sores in mouth, or a toothache) UNUSUAL RASH, SWELLING OR PAIN  UNUSUAL VAGINAL DISCHARGE OR ITCHING   Items with * indicate a potential emergency and should be followed up as soon as possible or go to the Emergency Department if any problems should occur.  Please show the CHEMOTHERAPY ALERT CARD or IMMUNOTHERAPY ALERT CARD at check-in  to the Emergency Department and triage nurse.  Should you have questions after your visit or need to cancel or reschedule your appointment, please contact [facility] [place] [facility]  [phone] and follow the prompts.  Office hours are 8:00 a.m. to 4:30 p.m. Monday - Friday. Please note that voicemails left after 4:00 p.m. may not be returned until the following business day.  We are closed weekends and major holidays. You have access to a nurse at all times for urgent questions. Please call the main number to the clinic [phone] and follow the prompts.  For any non-urgent questions, you may also contact your provider using MyChart. We now offer e-Visits for anyone 18 and older to request care online for non-urgent symptoms. For details visit [email or web address].   Also download the MyChart app! Go to the app store, search "MyChart", open the app, select [place], and log in with your MyChart username and password.    

## 2023-02-05 NOTE — Progress Notes (Signed)
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  INTERVAL HISTORY: Patient returns to clinic today for further evaluation and consideration of cycle 5 which is maintenance Tecentriq only.  He continues to have chronic weakness and fatigue as well as chronic shortness of breath, but otherwise feels well. He does not complain of pain today.  He has no neurologic complaints.  He denies any recent fevers.  He has a good appetite and denies weight loss.  He has no chest pain or hemoptysis.  He has no nausea, vomiting, constipation, or diarrhea.  He has no urinary complaints.  Patient offers no further specific complaints today.  REVIEW OF SYSTEMS:   Review of Systems  Constitutional:  Positive for malaise/fatigue. Negative for fever and weight loss.  Respiratory:  Positive for shortness of breath. Negative for cough and hemoptysis.   Cardiovascular: Negative.  Negative for chest pain and leg swelling.  Gastrointestinal: Negative.  Negative for abdominal pain and nausea.  Genitourinary: Negative.  Negative for dysuria.  Musculoskeletal: Negative.  Negative for back pain and joint pain.  Skin: Negative.  Negative for rash.  Neurological:  Positive for weakness. Negative for dizziness, focal weakness and headaches.  Psychiatric/Behavioral: Negative.  The patient is not nervous/anxious.     As per HPI. Otherwise, a complete review of systems is negative.  PAST MEDICAL HISTORY: Past Medical History:  Diagnosis Date   Anxiety    Arthritis    Emphysema of lung (HCC)    HTN (hypertension)    Hyperlipemia    Schizoaffective disorder, bipolar type (HCC)    Substance abuse (HCC)     PAST SURGICAL HISTORY: Past  Surgical History:  Procedure Laterality Date   FINGER SURGERY     IR IMAGING GUIDED PORT INSERTION  11/04/2022   VIDEO BRONCHOSCOPY WITH ENDOBRONCHIAL ULTRASOUND Left 10/11/2022   Procedure: VIDEO BRONCHOSCOPY WITH ENDOBRONCHIAL ULTRASOUND;  Surgeon: Raechel Chute, MD;  Location: [facility] ORS;  Service: Pulmonary;  Laterality: Left;    FAMILY HISTORY: Family History  Problem Relation Age of Onset   Stroke Father    Seizures Father    Stroke Sister    Cancer Maternal Grandmother     ADVANCED DIRECTIVES (Y/N):  N  HEALTH MAINTENANCE: Social History   Tobacco Use   Smoking status: Former    Packs/day: 0.50    Years: 40.00    Additional pack years: 0.00    Total pack years: 20.00    Types: Cigarettes    Quit date: 10/15/2022    Years since quitting: 0.3   Smokeless tobacco: Former    Quit date: 10/15/2022  Substance Use Topics   Alcohol use: No   Drug use: No     Colonoscopy:  PAP:  Bone density:  Lipid panel:  No Known Allergies  Current Outpatient Medications  Medication Sig Dispense Refill   albuterol (VENTOLIN HFA) 108 (90 Base) MCG/ACT inhaler Inhale 2 puffs into the lungs every 6 (six) hours as needed for wheezing or shortness of breath.     benztropine (COGENTIN) 2 MG tablet Take 2 mg by mouth 2 (two) times daily.     bisacodyl (DULCOLAX) 5 MG EC tablet Take 1 tablet (5 mg total) by mouth daily as needed for moderate constipation. 30 tablet  0   dexamethasone (DECADRON) 4 MG tablet Take 1 tablet (4 mg total) by mouth daily. 30 tablet 1   docusate sodium (COLACE) 100 MG capsule Take 1 capsule (100 mg total) by mouth 2 (two) times daily as needed for mild constipation. 10 capsule 0   fluPHENAZine (PROLIXIN) 5 MG tablet Take 2.5 mg by mouth daily.     guaiFENesin (MUCINEX) 600 MG 12 hr tablet Take 1 tablet (600 mg total) by mouth 2 (two) times daily as needed for cough. 30 tablet 0   HYDROcodone-acetaminophen (NORCO/VICODIN) 5-325 MG tablet Take 1-2 tablets by mouth  every 6 (six) hours as needed for moderate pain. 30 tablet 0   ipratropium-albuterol (DUONEB) 0.5-2.5 (3) MG/3ML SOLN Take 3 mLs by nebulization every 4 (four) hours as needed. 360 mL 0   lidocaine-prilocaine (EMLA) cream Apply to affected area once 30 g 3   lisinopril-hydrochlorothiazide (ZESTORETIC) 20-12.5 MG tablet Take 1 tablet by mouth daily.     meloxicam (MOBIC) 15 MG tablet Take 15 mg by mouth daily.     Multiple Vitamin (MULTI-VITAMIN) tablet Take 1 tablet by mouth daily.     potassium chloride (KLOR-CON) 10 MEQ tablet Take 10 mEq by mouth daily.     potassium chloride SA (KLOR-CON M) 20 MEQ tablet Take 1 tablet (20 mEq total) by mouth 2 (two) times daily. 60 tablet 0   propranolol (INDERAL) 10 MG tablet Take 0.5 tablets (5 mg total) by mouth 2 (two) times daily. 60 tablet 0   simvastatin (ZOCOR) 20 MG tablet Take 20 mg by mouth at bedtime.     sodium chloride 1 g tablet Take 1 tablet (1 g total) by mouth 3 (three) times daily. 30 tablet 2   Vitamin E 400 units TABS Take 400 Units by mouth daily.     No current facility-administered medications for this visit.   Facility-Administered Medications Ordered in Other Visits  Medication Dose Route Frequency Provider Last Rate Last Admin   0.9 %  sodium chloride infusion   Intravenous Once Orlie Dakin, Tollie Pizza, MD       atezolizumab [facility]) 1,200 mg in sodium chloride 0.9 % 250 mL chemo infusion  1,200 mg Intravenous Once Jeralyn Ruths, MD       heparin lock flush 100 unit/mL  500 Units Intracatheter Once PRN Jeralyn Ruths, MD       sodium chloride flush (NS) 0.9 % injection 10 mL  10 mL Intracatheter PRN Jeralyn Ruths, MD        OBJECTIVE: Vitals:   02/05/23 0852  BP: (!) 143/82  Pulse: 74  Resp: 16  Temp: (!) 96.3 F (35.7 C)  SpO2: 100%     Body mass index is 28.8 kg/m.    ECOG FS:1 - Symptomatic but completely ambulatory  General: Well-developed, well-nourished, no acute distress.  Sitting in a  wheelchair. Eyes: Pink conjunctiva, anicteric sclera. HEENT: Normocephalic, moist mucous membranes. Lungs: No audible wheezing or coughing. Heart: Regular rate and rhythm. Abdomen: Soft, nontender, no obvious distention. Musculoskeletal: No edema, cyanosis, or clubbing. Neuro: Alert, answering all questions appropriately. Cranial nerves grossly intact. Skin: No rashes or petechiae noted. Psych: Normal affect.   LAB RESULTS:  Lab Results  Component Value Date   NA 132 (L) 02/05/2023   K 3.8 02/05/2023   CL 96 (L) 02/05/2023   CO2 27 02/05/2023   GLUCOSE 113 (H) 02/05/2023   BUN 8 02/05/2023   CREATININE 0.58 (L) 02/05/2023   CALCIUM 8.7 (L)  02/05/2023   PROT 6.2 (L) 02/05/2023   ALBUMIN 3.6 02/05/2023   AST 19 02/05/2023   ALT 10 02/05/2023   ALKPHOS 90 02/05/2023   BILITOT 0.4 02/05/2023   GFRNONAA >60 02/05/2023    Lab Results  Component Value Date   WBC 6.9 02/05/2023   NEUTROABS 4.8 02/05/2023   HGB 6.8 (LL) 02/05/2023   HCT 20.8 (L) 02/05/2023   MCV 97.7 02/05/2023   PLT 318 02/05/2023     STUDIES: No results found.  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  Biopsy confirmed small cell lung carcinoma.  Patient could not tolerate MRI, but CT of the brain on October 11, 2022 is negative.  PET scan results from November 04, 2022 reviewed independently with widespread bony metastasis. Patient has completed palliative XRT given his symptoms and encasement of pulmonary vessels.  Patient completed his chemotherapy on January 17, 2023 and now will proceed with maintenance Tecentriq.  PET scan results from yesterday are pending at time of dictation.  Return to clinic tomorrow for blood  transfusion and then in 3 weeks for further evaluation and consideration of cycle 6. Shortness of breath/cough: Chronic and unchanged.  Appreciate pulmonary input.  Patient is now on oxygen.   Bone lesions: Continue Zometa with odd-numbered cycles. Hyponatremia: Chronic and unchanged.  Patient's sodium level is 132 today.  Continue salt tablets as prescribed. Hypokalemia: Resolved.  Continue oral potassium supplementation. Pain: Improved.  Patient has been instructed to take his hydrocodone as prescribed.  Appreciate palliative care input. Leukocytosis: Resolved. Anemia: Proceed with Tecentriq as above.  Patient will return to clinic tomorrow for 2 units of packed red blood cells.   Patient expressed understanding and was in agreement with this plan. He also understands that He can call clinic at any time with any questions, concerns, or complaints.    Cancer Staging  Small cell lung cancer (HCC) Staging form: Lung, AJCC 8th Edition - Clinical: Stage IVA (cT4, cN2, cM1a) - Signed by Jeralyn Ruths, MD on 10/29/2022   Jeralyn Ruths, MD   02/05/2023 9:35 AM

## 2023-02-06 ENCOUNTER — Inpatient Hospital Stay: Payer: Medicare Other

## 2023-02-06 DIAGNOSIS — C3490 Malignant neoplasm of unspecified part of unspecified bronchus or lung: Secondary | ICD-10-CM

## 2023-02-06 DIAGNOSIS — Z5112 Encounter for antineoplastic immunotherapy: Secondary | ICD-10-CM | POA: Diagnosis not present

## 2023-02-06 LAB — BPAM RBC
Blood Product Expiration Date: 202405222359
ISSUE DATE / TIME: 202405160907
ISSUE DATE / TIME: 202405161112

## 2023-02-06 LAB — TYPE AND SCREEN

## 2023-02-06 MED ORDER — HEPARIN SOD (PORK) LOCK FLUSH 100 UNIT/ML IV SOLN
500.0000 [IU] | Freq: Every day | INTRAVENOUS | Status: AC | PRN
  Administered 2023-02-06: 500 [IU]
  Filled 2023-02-06: qty 5

## 2023-02-06 MED ORDER — DIPHENHYDRAMINE HCL 50 MG/ML IJ SOLN
25.0000 mg | Freq: Once | INTRAMUSCULAR | Status: AC
  Administered 2023-02-06: 25 mg via INTRAVENOUS
  Filled 2023-02-06: qty 1

## 2023-02-06 MED ORDER — SODIUM CHLORIDE 0.9% IV SOLUTION
250.0000 mL | Freq: Once | INTRAVENOUS | Status: AC
  Administered 2023-02-06: 250 mL via INTRAVENOUS
  Filled 2023-02-06: qty 250

## 2023-02-06 MED ORDER — ACETAMINOPHEN 325 MG PO TABS
650.0000 mg | ORAL_TABLET | Freq: Once | ORAL | Status: AC
  Administered 2023-02-06: 650 mg via ORAL
  Filled 2023-02-06: qty 2

## 2023-02-07 LAB — BPAM RBC
Blood Product Expiration Date: 202405252359
Unit Type and Rh: 600
Unit Type and Rh: 600

## 2023-02-07 LAB — TYPE AND SCREEN
Antibody Screen: NEGATIVE
Unit division: 0
Unit division: 0

## 2023-02-23 ENCOUNTER — Other Ambulatory Visit: Payer: Self-pay

## 2023-02-23 ENCOUNTER — Inpatient Hospital Stay: Payer: Medicare Other

## 2023-02-23 ENCOUNTER — Emergency Department: Payer: Medicare Other

## 2023-02-23 ENCOUNTER — Encounter: Payer: Self-pay | Admitting: Emergency Medicine

## 2023-02-23 ENCOUNTER — Inpatient Hospital Stay
Admission: EM | Admit: 2023-02-23 | Discharge: 2023-02-25 | DRG: 100 | Disposition: A | Discharge status: Home / Self Care | Payer: Medicare Other | Attending: Internal Medicine | Admitting: Internal Medicine

## 2023-02-23 DIAGNOSIS — Z66 Do not resuscitate: Secondary | ICD-10-CM | POA: Diagnosis present

## 2023-02-23 DIAGNOSIS — Z823 Family history of stroke: Secondary | ICD-10-CM

## 2023-02-23 DIAGNOSIS — C3490 Malignant neoplasm of unspecified part of unspecified bronchus or lung: Secondary | ICD-10-CM | POA: Diagnosis present

## 2023-02-23 DIAGNOSIS — R569 Unspecified convulsions: Principal | ICD-10-CM

## 2023-02-23 DIAGNOSIS — Z820 Family history of epilepsy and other diseases of the nervous system: Secondary | ICD-10-CM | POA: Diagnosis not present

## 2023-02-23 DIAGNOSIS — F419 Anxiety disorder, unspecified: Secondary | ICD-10-CM | POA: Diagnosis present

## 2023-02-23 DIAGNOSIS — Z87891 Personal history of nicotine dependence: Secondary | ICD-10-CM | POA: Diagnosis not present

## 2023-02-23 DIAGNOSIS — Z91148 Patient's other noncompliance with medication regimen for other reason: Secondary | ICD-10-CM

## 2023-02-23 DIAGNOSIS — Z923 Personal history of irradiation: Secondary | ICD-10-CM | POA: Diagnosis not present

## 2023-02-23 DIAGNOSIS — J690 Pneumonitis due to inhalation of food and vomit: Secondary | ICD-10-CM | POA: Diagnosis present

## 2023-02-23 DIAGNOSIS — E876 Hypokalemia: Secondary | ICD-10-CM | POA: Diagnosis present

## 2023-02-23 DIAGNOSIS — J9621 Acute and chronic respiratory failure with hypoxia: Secondary | ICD-10-CM | POA: Diagnosis present

## 2023-02-23 DIAGNOSIS — J439 Emphysema, unspecified: Secondary | ICD-10-CM | POA: Diagnosis present

## 2023-02-23 DIAGNOSIS — E871 Hypo-osmolality and hyponatremia: Secondary | ICD-10-CM | POA: Diagnosis present

## 2023-02-23 DIAGNOSIS — E785 Hyperlipidemia, unspecified: Secondary | ICD-10-CM | POA: Diagnosis present

## 2023-02-23 DIAGNOSIS — G40909 Epilepsy, unspecified, not intractable, without status epilepticus: Principal | ICD-10-CM | POA: Diagnosis present

## 2023-02-23 DIAGNOSIS — I10 Essential (primary) hypertension: Secondary | ICD-10-CM | POA: Diagnosis present

## 2023-02-23 DIAGNOSIS — Z79899 Other long term (current) drug therapy: Secondary | ICD-10-CM

## 2023-02-23 DIAGNOSIS — Z9981 Dependence on supplemental oxygen: Secondary | ICD-10-CM | POA: Diagnosis not present

## 2023-02-23 DIAGNOSIS — Z791 Long term (current) use of non-steroidal anti-inflammatories (NSAID): Secondary | ICD-10-CM

## 2023-02-23 DIAGNOSIS — J449 Chronic obstructive pulmonary disease, unspecified: Secondary | ICD-10-CM | POA: Insufficient documentation

## 2023-02-23 DIAGNOSIS — F209 Schizophrenia, unspecified: Secondary | ICD-10-CM | POA: Diagnosis present

## 2023-02-23 DIAGNOSIS — F250 Schizoaffective disorder, bipolar type: Secondary | ICD-10-CM | POA: Diagnosis present

## 2023-02-23 DIAGNOSIS — E872 Acidosis, unspecified: Secondary | ICD-10-CM

## 2023-02-23 LAB — CBC WITH DIFFERENTIAL/PLATELET
Abs Immature Granulocytes: 0.1 10*3/uL — ABNORMAL HIGH (ref 0.00–0.07)
Basophils Absolute: 0.1 10*3/uL (ref 0.0–0.1)
Basophils Relative: 1 %
Eosinophils Absolute: 0.4 10*3/uL (ref 0.0–0.5)
Eosinophils Relative: 3 %
HCT: 34.1 % — ABNORMAL LOW (ref 39.0–52.0)
Hemoglobin: 11.1 g/dL — ABNORMAL LOW (ref 13.0–17.0)
Immature Granulocytes: 1 %
Lymphocytes Relative: 16 %
Lymphs Abs: 2.1 10*3/uL (ref 0.7–4.0)
MCH: 30.7 pg (ref 26.0–34.0)
MCHC: 32.6 g/dL (ref 30.0–36.0)
MCV: 94.2 fL (ref 80.0–100.0)
Monocytes Absolute: 1.1 10*3/uL — ABNORMAL HIGH (ref 0.1–1.0)
Monocytes Relative: 8 %
Neutro Abs: 9 10*3/uL — ABNORMAL HIGH (ref 1.7–7.7)
Neutrophils Relative %: 71 %
Platelets: 276 10*3/uL (ref 150–400)
RBC: 3.62 MIL/uL — ABNORMAL LOW (ref 4.22–5.81)
RDW: 14.8 % (ref 11.5–15.5)
WBC: 12.7 10*3/uL — ABNORMAL HIGH (ref 4.0–10.5)
nRBC: 0 % (ref 0.0–0.2)

## 2023-02-23 LAB — COMPREHENSIVE METABOLIC PANEL
ALT: 12 U/L (ref 0–44)
AST: 38 U/L (ref 15–41)
Albumin: 4.1 g/dL (ref 3.5–5.0)
Alkaline Phosphatase: 94 U/L (ref 38–126)
Anion gap: 17 — ABNORMAL HIGH (ref 5–15)
BUN: 10 mg/dL (ref 6–20)
CO2: 18 mmol/L — ABNORMAL LOW (ref 22–32)
Calcium: 8.9 mg/dL (ref 8.9–10.3)
Chloride: 95 mmol/L — ABNORMAL LOW (ref 98–111)
Creatinine, Ser: 0.69 mg/dL (ref 0.61–1.24)
GFR, Estimated: >60 mL/min (ref >60)
Glucose, Bld: 140 mg/dL — ABNORMAL HIGH (ref 70–99)
Potassium: 3.9 mmol/L (ref 3.5–5.1)
Sodium: 130 mmol/L — ABNORMAL LOW (ref 135–145)
Total Bilirubin: 0.8 mg/dL (ref 0.3–1.2)
Total Protein: 7.2 g/dL (ref 6.5–8.1)

## 2023-02-23 LAB — BLOOD GAS, VENOUS
Acid-Base Excess: 0.3 mmol/L (ref 0.0–2.0)
Bicarbonate: 26.5 mmol/L (ref 20.0–28.0)
O2 Saturation: 98.2 %
Patient temperature: 37
pCO2, Ven: 48 mmHg (ref 44–60)
pH, Ven: 7.35 (ref 7.25–7.43)
pO2, Ven: 96 mmHg — ABNORMAL HIGH (ref 32–45)

## 2023-02-23 LAB — LACTIC ACID, PLASMA
Lactic Acid, Venous: >9 mmol/L (ref 0.5–1.9)
Lactic Acid, Venous: 2.3 mmol/L (ref 0.5–1.9)

## 2023-02-23 LAB — BASIC METABOLIC PANEL
Anion gap: 8 (ref 5–15)
BUN: 7 mg/dL (ref 6–20)
CO2: 27 mmol/L (ref 22–32)
Calcium: 8.8 mg/dL — ABNORMAL LOW (ref 8.9–10.3)
Chloride: 100 mmol/L (ref 98–111)
Creatinine, Ser: 0.52 mg/dL — ABNORMAL LOW (ref 0.61–1.24)
GFR, Estimated: >60 mL/min (ref >60)
Glucose, Bld: 111 mg/dL — ABNORMAL HIGH (ref 70–99)
Potassium: 3.6 mmol/L (ref 3.5–5.1)
Sodium: 135 mmol/L (ref 135–145)

## 2023-02-23 LAB — TROPONIN I (HIGH SENSITIVITY)
Troponin I (High Sensitivity): 8 ng/L (ref <18)
Troponin I (High Sensitivity): 12 ng/L (ref <18)

## 2023-02-23 LAB — MAGNESIUM: Magnesium: 1.6 mg/dL — ABNORMAL LOW (ref 1.7–2.4)

## 2023-02-23 LAB — CULTURE, BLOOD (ROUTINE X 2): Culture: NO GROWTH

## 2023-02-23 LAB — PROTIME-INR
INR: 1.1 (ref 0.8–1.2)
Prothrombin Time: 14.2 seconds (ref 11.4–15.2)

## 2023-02-23 LAB — APTT: aPTT: 26 seconds (ref 24–36)

## 2023-02-23 MED ORDER — ENOXAPARIN SODIUM 40 MG/0.4ML IJ SOSY
40.0000 mg | PREFILLED_SYRINGE | INTRAMUSCULAR | Status: DC
  Administered 2023-02-23 – 2023-02-25 (×3): 40 mg via SUBCUTANEOUS
  Filled 2023-02-23 (×3): qty 0.4

## 2023-02-23 MED ORDER — IOHEXOL 350 MG/ML SOLN
100.0000 mL | Freq: Once | INTRAVENOUS | Status: AC | PRN
  Administered 2023-02-23: 100 mL via INTRAVENOUS

## 2023-02-23 MED ORDER — LEVETIRACETAM IN NACL 1500 MG/100ML IV SOLN
1500.0000 mg | Freq: Once | INTRAVENOUS | Status: AC
  Administered 2023-02-23: 1500 mg via INTRAVENOUS
  Filled 2023-02-23: qty 100

## 2023-02-23 MED ORDER — ONDANSETRON HCL 4 MG/2ML IJ SOLN: 4.0000 mg | Freq: Four times a day (QID) | INTRAMUSCULAR | Status: DC | PRN

## 2023-02-23 MED ORDER — ETOMIDATE 2 MG/ML IV SOLN
INTRAVENOUS | Status: AC
  Filled 2023-02-23: qty 20

## 2023-02-23 MED ORDER — ORAL CARE MOUTH RINSE
15.0000 mL | OROMUCOSAL | Status: DC
  Administered 2023-02-23 – 2023-02-24 (×9): 15 mL via OROMUCOSAL
  Filled 2023-02-23 (×21): qty 15

## 2023-02-23 MED ORDER — VANCOMYCIN HCL IN DEXTROSE 1-5 GM/200ML-% IV SOLN: 1000.0000 mg | Freq: Once | INTRAVENOUS | Status: DC

## 2023-02-23 MED ORDER — SODIUM CHLORIDE 0.9 % IV BOLUS
1000.0000 mL | Freq: Once | INTRAVENOUS | Status: AC
  Administered 2023-02-23: 1000 mL via INTRAVENOUS

## 2023-02-23 MED ORDER — ACETAMINOPHEN 650 MG RE SUPP: 650.0000 mg | RECTAL | Status: DC | PRN

## 2023-02-23 MED ORDER — SODIUM CHLORIDE 0.9 % IV SOLN
3.0000 g | Freq: Four times a day (QID) | INTRAVENOUS | Status: DC
  Administered 2023-02-23 – 2023-02-25 (×9): 3 g via INTRAVENOUS
  Filled 2023-02-23 (×11): qty 8

## 2023-02-23 MED ORDER — LORAZEPAM 2 MG/ML IJ SOLN: 2.0000 mg | INTRAMUSCULAR | Status: DC | PRN

## 2023-02-23 MED ORDER — DIPHENHYDRAMINE HCL 25 MG PO CAPS
50.0000 mg | ORAL_CAPSULE | Freq: Four times a day (QID) | ORAL | Status: DC | PRN
  Administered 2023-02-23: 50 mg via ORAL
  Filled 2023-02-23: qty 2

## 2023-02-23 MED ORDER — ROCURONIUM BROMIDE 10 MG/ML (PF) SYRINGE
PREFILLED_SYRINGE | INTRAVENOUS | Status: AC
  Filled 2023-02-23: qty 10

## 2023-02-23 MED ORDER — MIDAZOLAM HCL 2 MG/2ML IJ SOLN
2.0000 mg | Freq: Once | INTRAMUSCULAR | Status: AC
  Administered 2023-02-23: 0.5 mg via INTRAVENOUS
  Filled 2023-02-23 (×2): qty 2

## 2023-02-23 MED ORDER — LEVETIRACETAM IN NACL 500 MG/100ML IV SOLN
500.0000 mg | Freq: Two times a day (BID) | INTRAVENOUS | Status: DC
  Administered 2023-02-23 – 2023-02-25 (×5): 500 mg via INTRAVENOUS
  Filled 2023-02-23 (×5): qty 100

## 2023-02-23 MED ORDER — ORAL CARE MOUTH RINSE: 15.0000 mL | OROMUCOSAL | Status: DC | PRN

## 2023-02-23 MED ORDER — ONDANSETRON HCL 4 MG PO TABS: 4.0000 mg | ORAL_TABLET | Freq: Four times a day (QID) | ORAL | Status: DC | PRN

## 2023-02-23 MED ORDER — IOHEXOL 350 MG/ML SOLN
75.0000 mL | Freq: Once | INTRAVENOUS | Status: AC | PRN
  Administered 2023-02-23: 75 mL via INTRAVENOUS

## 2023-02-23 MED ORDER — SODIUM CHLORIDE 0.9 % IV SOLN
75.0000 mL/h | INTRAVENOUS | Status: DC
  Administered 2023-02-23 (×2): 75 mL/h via INTRAVENOUS

## 2023-02-23 MED ORDER — VANCOMYCIN HCL 2000 MG/400ML IV SOLN
2000.0000 mg | Freq: Once | INTRAVENOUS | Status: AC
  Administered 2023-02-23: 2000 mg via INTRAVENOUS
  Filled 2023-02-23: qty 400

## 2023-02-23 MED ORDER — MENTHOL 3 MG MT LOZG
1.0000 | LOZENGE | OROMUCOSAL | Status: DC | PRN
  Filled 2023-02-23: qty 9

## 2023-02-23 MED ORDER — SODIUM CHLORIDE 0.9 % IV SOLN
500.0000 mg | Freq: Once | INTRAVENOUS | Status: AC
  Administered 2023-02-23: 500 mg via INTRAVENOUS
  Filled 2023-02-23: qty 5

## 2023-02-23 MED ORDER — SODIUM CHLORIDE 0.9 % IV SOLN
2.0000 g | Freq: Once | INTRAVENOUS | Status: AC
  Administered 2023-02-23: 2 g via INTRAVENOUS
  Filled 2023-02-23: qty 12.5

## 2023-02-23 MED ORDER — ACETAMINOPHEN 325 MG PO TABS: 650.0000 mg | ORAL_TABLET | ORAL | Status: DC | PRN

## 2023-02-23 NOTE — Assessment & Plan Note (Signed)
Suspect related to seizure.  Low suspicion for sepsis at this time IV hydration and monitor

## 2023-02-23 NOTE — Progress Notes (Signed)
  Progress Note   Patient: Trevor Montgomery LKG:[id number] DOB: [birth year] DOA: 02/23/2023     0 DOS: the patient was seen and examined on 02/23/2023   Brief hospital course: Serapio Gelinas is a 62 y.o. 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 He had generalized tonic-clonic seizure en route got Versed. Initial WBC, lactic elevated, CTA chest showed patchy airspace opacity throughout the dependent left upper lobe and both lower lobe consistent with pneumonia, possible aspiration. He is admitted for further management and evaluation.  Assessment and Plan: * Seizure (HCC) Unresponsiveness, likely secondary to postictal state --resolved Patient is able to answer me, no more episodes of seizures Seizure possibly related to hyponatremia vs non compliance with meds. Continue Keppra Ativan as needed. Neurology evaluation. EEG. Trend and correct sodium Fall, seizure and aspiration precautions  Aspiration pneumonia [facility]) Patient likely aspirated when he became unresponsive. SLP evaluated, ok for clears. Continue Unasyn. Gentle IV fluids. Supplemental oxygen as needed to maintain saturation above 92%.  Lactic acidosis Due to seizures, low suspicion for sepsis. Lactic acid trended down. IV hydration.  Hyponatremia Likely SIADH secondary to lung cancer Sodium 135 today. Will restart sodium chloride tablets at that time  Schizophrenia [facility]) Home meds will be resumed.  Hypertension Normotensive Will hold antihypertensives in the setting of severe aspiration episode Hydralazine as needed  Chronic obstructive lung disease (HCC) Chronic respiratory failure with hypoxia DuoNeb as needed Continue supplemental oxygen  Small cell lung cancer [facility]) Patient on  palliative radiation and chemo      Subjective: Patient is seen and examined today morning. He is lying in bed, able to answer me appropriately. No seizure episodes overnight.   Physical Exam: Vitals:   02/23/23 0630 02/23/23 0800 02/23/23 0830 02/23/23 0930  BP: 139/88 (!) 154/75 (!) 156/86   Pulse: (!) 101 (!) 117 (!) 102 90  Resp: 18 (!) 32 20 16  Temp:      TempSrc:      SpO2: 99% 100% 98% 100%  Weight:      Height:       General -Elderly middle aged Caucasian male, no apparent distress Heart - S1, S2 hear, bradycardia. Lungs, clear, bibasal rales. Abdomen- soft, non tender. Neuro- AAO, non focal exam.  Data Reviewed:  CBC, BMP, Lactic acid, cultures  Family Communication: Patient understands and agrees with the current plan, will plan to talk to her sister over phone.  Disposition: Status is: Inpatient Remains inpatient appropriate because: seizure episode, electrolyte management, IV hydration, antibiotics.  Planned Discharge Destination: Home with Home Health    Time spent: 44 minutes  Author: Marcelino Duster, MD 02/23/2023 10:27 AM  For on call review [email or web address].

## 2023-02-23 NOTE — Evaluation (Signed)
Clinical/Bedside Swallow Evaluation Patient Details  Name: Khiyan Romenesko MRN: [id number] Date of Birth: 08-22-1961  Today's Date: 02/23/2023 Time: SLP Start Time (ACUTE ONLY): 0818 SLP Stop Time (ACUTE ONLY): 0833 SLP Time Calculation (min) (ACUTE ONLY): 15 min  Past Medical History:  Past Medical History:  Diagnosis Date   Anxiety    Arthritis    Emphysema of lung (HCC)    HTN (hypertension)    Hyperlipemia    Schizoaffective disorder, bipolar type (HCC)    Substance abuse (HCC)    Past Surgical History:  Past Surgical History:  Procedure Laterality Date   FINGER SURGERY     IR IMAGING GUIDED PORT INSERTION  11/04/2022   VIDEO BRONCHOSCOPY WITH ENDOBRONCHIAL ULTRASOUND Left 10/11/2022   Procedure: VIDEO BRONCHOSCOPY WITH ENDOBRONCHIAL ULTRASOUND;  Surgeon: Raechel Chute, MD;  Location: [facility] ORS;  Service: Pulmonary;  Laterality: Left;   HPI:  Per H&P "Sadat Yarter is a 62 y.o. 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  Wife called EMS and started chest compressions shortly before their arrival.  Patient had a generalized tonic-clonic seizure en route for which he was administered Versed.  ED course and data review: On arrival he was tachycardic and tachypneic but otherwise normal vitals and was more responsive.  Labs resulted with WBC 12,000, lactic acid greater than 9.Hemoglobin 11.1 up from 6.8 a couple weeks prior.  Sodium 130, bicarb 18, troponin 8.  CTA chest negative for PE but showing findings suggestive of aspiration as outlined below:  IMPRESSION:  1. No pulmonary embolus.  2. Extensive patchy airspace opacity throughout the dependent left  upper lobe and both lower lobe consistent with pneumonia. Large  amount of retained debris within the distal  trachea tracking into  both mainstem bronchi consistent highly suggestive of aspiration.  3. Left upper lobe airspace disease obscures the previous left upper  lobe nodule.  4. Mild T12 compression deformity may be new from prior PET.  5. Emphysema with chronic bronchial thickening.     Head CT unrevealing     Patient was treated with cefepime vancomycin and azithromycin for aspiration pneumonia.  He was loaded with Keppra."    Assessment / Plan / Recommendation  Clinical Impression  Pt seen for clinical swallowing evaluation. Pt alert, press of speech noted as well as inappropriate affect (e.g. smiling when he is expressing disgust). Pt on 6L/min O2 via [place]. Cleared with RN. Pt with no obvious orofacial weakness, but did not follow commands for oral motor examination. Missing dention and poor dental hygiene noted. Trials limited to puree and thin as pt declined solid trials due to complaints of sore throat. Pt demonstrated an intact oral swallow for consistencies given. No overt s/sx pharyngeal dysphagia noted. Pharyngeal swallow appeared [facility] during limited evaluation. Encouragement needed for self feeding. Recommend initiation of a Dysphagia 1 Diet with Thin Liquids and safe swallowing strategies/aspiration precautions as outlined below. SLP to f/u per POC for diet tolerance and trials of upgraded textures.  SLP Visit Diagnosis: Dysphagia, oral phase (R13.11)    Aspiration Risk  Mild aspiration risk    Diet Recommendation Dysphagia 1 (Puree);Thin liquid   Medication Administration:  (as tolerated) Supervision: Staff to assist with self feeding;Intermittent supervision to cue for compensatory strategies Compensations: Minimize environmental distractions;Slow rate;Small  sips/bites Postural Changes: Seated upright at 90 degrees;Remain upright for at least 30 minutes after po intake    Other  Recommendations Oral Care Recommendations: Oral care BID;Staff/trained caregiver to provide oral care     Recommendations for follow up therapy are one component of a multi-disciplinary discharge planning process, led by the attending physician.  Recommendations may be updated based on patient status, additional functional criteria and insurance authorization.  Follow up Recommendations  (TBD)      Assistance Recommended at Discharge    Functional Status Assessment Patient has had a recent decline in their functional status and demonstrates the ability to make significant improvements in function in a reasonable and predictable amount of time.  Frequency and Duration min 2x/week  2 weeks       Prognosis Prognosis for improved oropharyngeal function: Fair Barriers to Reach Goals: Behavior      Swallow Study   General Date of Onset: 02/23/23 HPI: Per H&P "Victor Seagrave is a 62 y.o. 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  Wife called EMS and started chest compressions shortly before their arrival.  Patient had a generalized tonic-clonic seizure en route for which he was administered Versed.  ED course and data review: On arrival he was tachycardic and tachypneic but otherwise normal vitals and was more responsive.  Labs resulted with WBC 12,000, lactic acid greater than 9.Hemoglobin 11.1 up from 6.8 a couple weeks prior.  Sodium 130, bicarb 18, troponin 8.  CTA chest negative for PE but showing findings suggestive of aspiration as outlined below:  IMPRESSION:  1. No pulmonary embolus.  2. Extensive patchy airspace opacity throughout the dependent left  upper lobe and both lower lobe consistent with pneumonia. Large  amount of retained debris within the distal trachea tracking into  both mainstem bronchi consistent highly suggestive of aspiration.  3. Left upper  lobe airspace disease obscures the previous left upper  lobe nodule.  4. Mild T12 compression deformity may be new from prior PET.  5. Emphysema with chronic bronchial thickening.     Head CT unrevealing     Patient was treated with cefepime vancomycin and azithromycin for aspiration pneumonia.  He was loaded with Keppra." Type of Study: Bedside Swallow Evaluation Previous Swallow Assessment: none Diet Prior to this Study: NPO Respiratory Status: Nasal cannula (6L/min) History of Recent Intubation: No Behavior/Cognition: Alert;Distractible;Requires cueing Oral Cavity Assessment: Within Functional Limits Oral Care Completed by SLP: Yes Oral Cavity - Dentition: Missing dentition;Poor condition Vision: Functional for self-feeding Self-Feeding Abilities: Able to feed self Patient Positioning: Upright in bed Baseline Vocal Quality: Normal Volitional Cough: Cognitively unable to elicit Volitional Swallow: Unable to elicit    Oral/Motor/Sensory Function Overall Oral Motor/Sensory Function: Within functional limits (per observation)   Ice Chips Ice chips: Not tested   Thin Liquid Thin Liquid: Within functional limits Presentation: Cup;Spoon;Self Fed    Nectar Thick Nectar Thick Liquid: Not tested   Honey Thick Honey Thick Liquid: Not tested   Puree Puree: Within functional limits Presentation: Spoon;Self Fed Other Comments: encouragement to self feed   Solid     Solid: Not tested Other Comments: pt refused     Clyde Canterbury, M.S., CCC-SLP Speech-Language Pathologist [facility] [phone] Arnette Felts)  Victorino Dike  M Yomara Toothman 02/23/2023,9:55 AM

## 2023-02-23 NOTE — H&P (Addendum)
History and Physical    Patient: Trevor Montgomery QMV:[id number] DOB: December 04, 1960 DOA: 02/23/2023 DOS: the patient was seen and examined on 02/23/2023 PCP: Barbette Reichmann, MD  Patient coming from: Home  Chief Complaint:  Chief Complaint  Patient presents with   Unresponsive   Seizures    HPI: Trevor Montgomery is a 62 y.o. 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  Wife called EMS and started chest compressions shortly before their arrival.  Patient had a generalized tonic-clonic seizure en route for which he was administered Versed. ED course and data review: On arrival he was tachycardic and tachypneic but otherwise normal vitals and was more responsive. Labs resulted with WBC 12,000, lactic acid greater than 9.Hemoglobin 11.1 up from 6.8 a couple weeks prior.  Sodium 130, bicarb 18, troponin 8. CTA chest negative for PE but showing findings suggestive of aspiration as outlined below: IMPRESSION: 1. No pulmonary embolus. 2. Extensive patchy airspace opacity throughout the dependent left upper lobe and both lower lobe consistent with pneumonia. Large amount of retained debris within the distal trachea tracking into both mainstem bronchi consistent highly suggestive of aspiration. 3. Left upper lobe airspace disease obscures the previous left upper lobe nodule. 4. Mild T12 compression deformity may be new from prior PET. 5. Emphysema with chronic bronchial thickening.  Head CT unrevealing  Patient was treated with cefepime vancomycin and azithromycin for aspiration pneumonia.  He was loaded with Keppra.  Patient progressively became more alert and awake while in the ED.  The ED provider had a prolonged discussion with patient's family at bedside who proceeded  with making him DNR/DNI while in the ED and keeping him comfortable if he were to worsen but for now would like to continue current acute care. Hospitalist consulted for admission.     Past Medical History:  Diagnosis Date   Anxiety    Arthritis    Emphysema of lung (HCC)    HTN (hypertension)    Hyperlipemia    Schizoaffective disorder, bipolar type (HCC)    Substance abuse (HCC)    Past Surgical History:  Procedure Laterality Date   FINGER SURGERY     IR IMAGING GUIDED PORT INSERTION  11/04/2022   VIDEO BRONCHOSCOPY WITH ENDOBRONCHIAL ULTRASOUND Left 10/11/2022   Procedure: VIDEO BRONCHOSCOPY WITH ENDOBRONCHIAL ULTRASOUND;  Surgeon: Raechel Chute, MD;  Location: [facility] ORS;  Service: Pulmonary;  Laterality: Left;   Social History:  reports that he quit smoking about 4 months ago. His smoking use included cigarettes. He has a 20.00 pack-year smoking history. He quit smokeless tobacco use about 4 months ago. He reports that he does not drink alcohol and does not use drugs.  Allergies  Allergen Reactions   Fluphenazine     Other Reaction(s): Unknown    Family History  Problem Relation Age of Onset   Stroke Father    Seizures Father    Stroke Sister    Cancer Maternal Grandmother     Prior to Admission medications   Medication Sig Start Date End Date Taking? Authorizing Provider  albuterol (VENTOLIN HFA) 108 (90 Base) MCG/ACT inhaler Inhale 2 puffs into the lungs every 6 (six) hours as needed for wheezing or shortness of breath. 09/13/22   [provider]  benztropine (COGENTIN) 2 MG tablet Take  2 mg by mouth 2 (two) times daily.    [provider]  bisacodyl (DULCOLAX) 5 MG EC tablet Take 1 tablet (5 mg total) by mouth daily as needed for moderate constipation. 10/16/22   Sunnie Nielsen, DO  dexamethasone (DECADRON) 4 MG tablet Take 1 tablet (4 mg total) by mouth daily. 11/06/22   Jeralyn Ruths, MD  docusate sodium (COLACE) 100 MG capsule Take 1  capsule (100 mg total) by mouth 2 (two) times daily as needed for mild constipation. 10/16/22   Sunnie Nielsen, DO  fluPHENAZine (PROLIXIN) 5 MG tablet Take 2.5 mg by mouth daily.    [provider]  guaiFENesin (MUCINEX) 600 MG 12 hr tablet Take 1 tablet (600 mg total) by mouth 2 (two) times daily as needed for cough. 10/16/22   Sunnie Nielsen, DO  HYDROcodone-acetaminophen (NORCO/VICODIN) 5-325 MG tablet Take 1-2 tablets by mouth every 6 (six) hours as needed for moderate pain. 11/06/22   Jeralyn Ruths, MD  ipratropium-albuterol (DUONEB) 0.5-2.5 (3) MG/3ML SOLN Take 3 mLs by nebulization every 4 (four) hours as needed. 11/11/22   Borders, Daryl Eastern, NP  lidocaine-prilocaine (EMLA) cream Apply to affected area once 10/29/22   Jeralyn Ruths, MD  lisinopril-hydrochlorothiazide (ZESTORETIC) 20-12.5 MG tablet Take 1 tablet by mouth daily. 12/11/22 12/11/23  [provider]  meloxicam (MOBIC) 15 MG tablet Take 15 mg by mouth daily. 05/08/22 05/08/23  [provider]  Multiple Vitamin (MULTI-VITAMIN) tablet Take 1 tablet by mouth daily.    [provider]  potassium chloride (KLOR-CON) 10 MEQ tablet Take 10 mEq by mouth daily. 11/13/22   [provider]  potassium chloride SA (KLOR-CON M) 20 MEQ tablet Take 1 tablet (20 mEq total) by mouth 2 (two) times daily. 01/28/23   Jeralyn Ruths, MD  propranolol (INDERAL) 10 MG tablet Take 0.5 tablets (5 mg total) by mouth 2 (two) times daily. 10/16/22   Sunnie Nielsen, DO  simvastatin (ZOCOR) 20 MG tablet Take 20 mg by mouth at bedtime. 05/02/22   [provider]  sodium chloride 1 g tablet Take 1 tablet (1 g total) by mouth 3 (three) times daily. 11/12/22   Jeralyn Ruths, MD  Vitamin E 400 units TABS Take 400 Units by mouth daily.    [provider]    Physical Exam: Vitals:   02/23/23 0100 02/23/23 0130 02/23/23 0210 02/23/23 0214  BP: 107/68 128/81    Pulse: 98 (!) 119 (!) 105  (!) 108  Resp: 17 (!) 27 (!) 21 20  Temp:      TempSrc:      SpO2: 95% 96% 99% 100%  Weight:      Height:       Physical Exam Vitals and nursing note reviewed.  Constitutional:      General: He is not in acute distress. HENT:     Head: Normocephalic and atraumatic.  Cardiovascular:     Rate and Rhythm: Regular rhythm. Tachycardia present.     Heart sounds: Normal heart sounds.  Pulmonary:     Effort: Tachypnea present.     Breath sounds: Normal breath sounds.  Abdominal:     Palpations: Abdomen is soft.     Tenderness: There is no abdominal tenderness.  Neurological:     Mental Status: He is alert. Mental status is at baseline.     Labs on Admission: I have personally reviewed following labs and imaging studies  CBC: Recent Labs  Lab 02/23/23 0044  WBC  12.7*  NEUTROABS 9.0*  HGB 11.1*  HCT 34.1*  MCV 94.2  PLT 276   Basic Metabolic Panel: Recent Labs  Lab 02/23/23 0044  NA 130*  K 3.9  CL 95*  CO2 18*  GLUCOSE 140*  BUN 10  CREATININE 0.69  CALCIUM 8.9   GFR: Estimated Creatinine Clearance: 108.1 mL/min (by C-G formula based on SCr of 0.69 mg/dL). Liver Function Tests: Recent Labs  Lab 02/23/23 0044  AST 38  ALT 12  ALKPHOS 94  BILITOT 0.8  PROT 7.2  ALBUMIN 4.1   No results for input(s): "LIPASE", "AMYLASE" in the last 168 hours. No results for input(s): "AMMONIA" in the last 168 hours. Coagulation Profile: Recent Labs  Lab 02/23/23 0044  INR 1.1   Cardiac Enzymes: No results for input(s): "CKTOTAL", "CKMB", "CKMBINDEX", "TROPONINI" in the last 168 hours. BNP (last 3 results) No results for input(s): "PROBNP" in the last 8760 hours. HbA1C: No results for input(s): "HGBA1C" in the last 72 hours. CBG: No results for input(s): "GLUCAP" in the last 168 hours. Lipid Profile: No results for input(s): "CHOL", "HDL", "LDLCALC", "TRIG", "CHOLHDL", "LDLDIRECT" in the last 72 hours. Thyroid Function Tests: No results for input(s): "TSH",  "T4TOTAL", "FREET4", "T3FREE", "THYROIDAB" in the last 72 hours. Anemia Panel: No results for input(s): "VITAMINB12", "FOLATE", "FERRITIN", "TIBC", "IRON", "RETICCTPCT" in the last 72 hours. Urine analysis:    Component Value Date/Time   COLORURINE STRAW (A) 11/12/2022 1955   APPEARANCEUR CLEAR (A) 11/12/2022 1955   LABSPEC 1.033 (H) 11/12/2022 1955   PHURINE 7.0 11/12/2022 1955   GLUCOSEU NEGATIVE 11/12/2022 1955   HGBUR MODERATE (A) 11/12/2022 1955   BILIRUBINUR NEGATIVE 11/12/2022 1955   KETONESUR NEGATIVE 11/12/2022 1955   PROTEINUR NEGATIVE 11/12/2022 1955   NITRITE NEGATIVE 11/12/2022 1955   LEUKOCYTESUR NEGATIVE 11/12/2022 1955    Radiological Exams on Admission: CT Angio Chest PE W and/or Wo Contrast  Result Date: 02/23/2023 CLINICAL DATA:  Pulmonary embolism (PE) suspected, high prob History of lung cancer on chemotherapy. EXAM: CT ANGIOGRAPHY CHEST WITH CONTRAST TECHNIQUE: Multidetector CT imaging of the chest was performed using the standard protocol during bolus administration of intravenous contrast. Multiplanar CT image reconstructions and MIPs were obtained to evaluate the vascular anatomy. RADIATION DOSE REDUCTION: This exam was performed according to the departmental dose-optimization program which includes automated exposure control, adjustment of the mA and/or kV according to patient size and/or use of iterative reconstruction technique. CONTRAST:  OMNIPAQUE IOHEXOL 350 MG/ML SOLN COMPARISON:  Chest radiograph earlier today. PET CT 02/02/2013 FINDINGS: Cardiovascular: There are no filling defects within the pulmonary arteries to suggest pulmonary embolus. Aortic atherosclerosis and tortuosity, no dissection or acute aortic findings. The heart is normal in size. No pericardial effusion. Right chest port in place. Mediastinum/Nodes: Scattered mediastinal lymph nodes are not enlarged by size criteria. There is soft tissue density at the left hilum which may be treatment  related change. No right hilar adenopathy. Small hiatal hernia with wall thickening of the distal esophagus. Lungs/Pleura: Extensive patchy airspace opacity throughout the dependent left upper lobe and central left lower lobe. Left upper lobe airspace disease obscures the previous left upper lobe pulmonary nodule. Lesser airspace disease within the dependent right lower lobe. Large amount of retained debris within the distal trachea tracking into both mainstem bronchi. Emphysema with bronchial thickening. Chronic bandlike atelectasis in the right lower lobe. No significant pleural effusion. Upper Abdomen: Punctate gallstone. Left renal cysts. No further imaging follow-up is needed. No acute upper abdominal  findings. Musculoskeletal: Exaggerated thoracic kyphosis. Diffuse sclerotic metastatic disease, stable from prior PET. Large Schmorl's node involving inferior endplate of L1 is stable. There is a mild T12 compression deformity that may be new. Review of the MIP images confirms the above findings. IMPRESSION: 1. No pulmonary embolus. 2. Extensive patchy airspace opacity throughout the dependent left upper lobe and both lower lobe consistent with pneumonia. Large amount of retained debris within the distal trachea tracking into both mainstem bronchi consistent highly suggestive of aspiration. 3. Left upper lobe airspace disease obscures the previous left upper lobe nodule. 4. Mild T12 compression deformity may be new from prior PET. 5. Emphysema with chronic bronchial thickening. Aortic Atherosclerosis (ICD10-I70.0) and Emphysema (ICD10-J43.9). Electronically Signed   By: Narda Rutherford M.D.   On: 02/23/2023 02:28   CT HEAD WO CONTRAST ( )  Result Date: 02/23/2023 CLINICAL DATA:  Unresponsive, seizure, history of lung cancer EXAM: CT HEAD WITHOUT CONTRAST TECHNIQUE: Contiguous axial images were obtained from the base of the skull through the vertex without intravenous contrast. RADIATION DOSE REDUCTION: This  exam was performed according to the departmental dose-optimization program which includes automated exposure control, adjustment of the mA and/or kV according to patient size and/or use of iterative reconstruction technique. COMPARISON:  None Available.  11/12/2022 FINDINGS: Brain: No evidence of acute infarction, hemorrhage, mass, mass effect, or midline shift. No hydrocephalus or extra-axial fluid collection. Gray-white differentiation is preserved. Unchanged size of the ventricles. The basilar cisterns are patent. Vascular: No hyperdense vessel. Skull: Negative for fracture or focal lesion. Sinuses/Orbits: Mucosal thickening in the ethmoid air cells. No acute finding in the orbits. Other: The mastoid air cells are well aerated. IMPRESSION: No acute intracranial process. Electronically Signed   By: Wiliam Ke M.D.   On: 02/23/2023 02:12   DG Chest Port 1 View  Result Date: 02/23/2023 CLINICAL DATA:  Question of sepsis. History of lung cancer on chemo. Apnea at home. Seizure. EXAM: PORTABLE CHEST 1 VIEW COMPARISON:  12/08/2022 FINDINGS: Borderline heart size with mild pulmonary vascular congestion. Perihilar infiltration, greater on the left, may be due to pneumonia or edema. No pleural effusions. No pneumothorax. Mediastinal contours appear intact. Power port type central venous catheter with tip over the cavoatrial junction region. Old rib fractures. IMPRESSION: Borderline heart size with pulmonary vascular congestion and perihilar infiltration, greater on the left. Electronically Signed   By: Burman Nieves M.D.   On: 02/23/2023 01:06     Data Reviewed: Relevant notes from primary care and specialist visits, past discharge summaries as available in EHR, including Care Everywhere. Prior diagnostic testing as pertinent to current admission diagnoses Updated medications and problem lists for reconciliation ED course, including vitals, labs, imaging, treatment and response to treatment Triage notes,  nursing and pharmacy notes and ED provider's notes Notable results as noted in HPI   Assessment and Plan: * Seizure (HCC) Unresponsiveness, likely secondary to postictal state --resolved Patient now awake and alert Head CT unrevealing Seizure possibly related to hyponatremia Spoke with patient and sister-she is open to EEG and neurology consult Continue Keppra Lorazepam as needed Correct sodium Fall seizure and aspiration precautions Keeping n.p.o.  Aspiration pneumonia (HCC) Patient likely aspirated when he became unresponsive Unasyn Keep n.p.o. Speech therapy eval IV fluids Supplemental oxygen as needed  Lactic acidosis Suspect related to seizure.  Low suspicion for sepsis at this time IV hydration and monitor  Hyponatremia - Likely SIADH secondary to lung cancer -Previously treated for sodium 121 (10/2022) with fluid restriction and sodium  chloride tablets 1 g 3 times daily -Currently on sodium infusion - Swallow eval and can restart sodium chloride tablets at that time  Schizophrenia [facility]) Holding home meds while n.p.o.  Hypertension Normotensive Will hold antihypertensives in the setting of severe aspiration episode Hydralazine as needed  Chronic obstructive lung disease (HCC) Chronic respiratory failure with hypoxia DuoNeb as needed Continue supplemental oxygen  Small cell lung cancer (HCC) Patient on palliative radiation and chemo      DVT prophylaxis: Lovenox  Consults: none  Advance Care Planning:   Code Status: DNR   Family Communication: sister at bedside  Disposition Plan: Back to previous home environment  Severity of Illness: The appropriate patient status for this patient is INPATIENT. Inpatient status is judged to be reasonable and necessary in order to provide the required intensity of service to ensure the patient's safety. The patient's presenting symptoms, physical exam findings, and initial radiographic and laboratory data in the  context of their chronic comorbidities is felt to place them at high risk for further clinical deterioration. Furthermore, it is not anticipated that the patient will be medically stable for discharge from the hospital within 2 midnights of admission.   * I certify that at the point of admission it is my clinical judgment that the patient will require inpatient hospital care spanning beyond 2 midnights from the point of admission due to high intensity of service, high risk for further deterioration and high frequency of surveillance required.*  Author: Andris Baumann, MD 02/23/2023 3:13 AM  For on call review [email or web address].

## 2023-02-23 NOTE — Assessment & Plan Note (Addendum)
Chronic respiratory failure with hypoxia DuoNeb as needed Continue supplemental oxygen

## 2023-02-23 NOTE — Assessment & Plan Note (Signed)
Normotensive Will hold antihypertensives in the setting of severe aspiration episode Hydralazine as needed

## 2023-02-23 NOTE — Assessment & Plan Note (Addendum)
Unresponsiveness, likely secondary to postictal state --resolved Patient now awake and alert Head CT unrevealing Seizure possibly related to hyponatremia Spoke with patient and sister-she is open to EEG and neurology consult Continue Keppra Lorazepam as needed Correct sodium Fall seizure and aspiration precautions Keeping n.p.o.

## 2023-02-23 NOTE — ED Notes (Signed)
Family at bedside. 

## 2023-02-23 NOTE — Assessment & Plan Note (Addendum)
Holding home meds while n.p.o.

## 2023-02-23 NOTE — Consult Note (Signed)
Neurology Consultation Reason for Consult: Seizure Requesting Physician: Lindajo Royal  CC: Seizure  History is obtained from: Patient and sister at bedside  HPI: Trevor Montgomery is a 62 y.o. 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  She heard him coughing and called out to him.  She noted he did turn on the [place] but was otherwise not responding so she went to check on him.  He was in the bed with his oxygen off, barely breathing and unresponsive.  She called 911 and was instructed to pull him off of the bed and began CPR.  Minimal compressions started before he started coming to.  Subsequently and route to the ED with EMS he had witnessed generalized tonic-clonic activity for which she was given Versed.  He was loaded with 2 g of Keppra in the ED (1500 mg times once with a closely spaced 500 mg dose), now on 500 mg twice daily.  He was treated with cefepime, vancomycin, azithromycin x 1 dose, and subsequently narrowed to ampicillin for concern for aspiration pneumonia.  Regarding seizure risk factors, there was a family history of seizures in his father but these may have been posttraumatic (father was a World War II veteran) or related to alcohol use.  Otherwise no family history of seizures.  Normal birth and development.  One episode of significant head trauma with brief loss of consciousness with a fracture on the top of his head.  No history of meningitis/encephalitis.  No prior history of seizures but sister notes that he had a very similar presentation in February.  At this time it was felt to be syncope and collapse in the setting of severe hyponatremia  He has not been  having any focal neurological symptoms, any headaches, any other pain.  He has been having some swelling in the lower extremities and weakness, fatigue, shortness of breath, but has been tolerating his cancer treatments well.  No other witnessed seizure activity, though sister notes he has had left greater than right sided pill-rolling tremor for some time now, more noticeable since beginning chemo/radiation  ROS: All other review of systems was negative except as noted in the HPI.   Past Medical History:  Diagnosis Date   Anxiety    Arthritis    Emphysema of lung (HCC)    HTN (hypertension)    Hyperlipemia    Schizoaffective disorder, bipolar type (HCC)    Substance abuse (HCC)    Past Surgical History:  Procedure Laterality Date   FINGER SURGERY     IR IMAGING GUIDED PORT INSERTION  11/04/2022   VIDEO BRONCHOSCOPY WITH ENDOBRONCHIAL ULTRASOUND Left 10/11/2022   Procedure: VIDEO BRONCHOSCOPY WITH ENDOBRONCHIAL ULTRASOUND;  Surgeon: Raechel Chute, MD;  Location: [facility] ORS;  Service: Pulmonary;  Laterality: Left;   Family History  Problem Relation Age of Onset   Stroke Father    Seizures Father    Stroke Sister    Cancer Maternal Grandmother    Social History:  reports that he quit smoking about 4 months ago. His smoking use included cigarettes. He has a 20.00 pack-year smoking history. He quit smokeless tobacco use about 4 months ago. He reports that he does not drink alcohol  and does not use drugs.  Exam: Current vital signs: BP 139/88   Pulse (!) 101   Temp 98.6 F (37 C) (Rectal)   Resp 18   Ht 5\' 9"  (1.753 m)   Wt 88.5 kg   SpO2 99%   BMI 28.80 kg/m  Vital signs in last 24 hours: Temp:  [98.6 F (37 C)] 98.6 F (37 C) (06/02 0042) Pulse Rate:  [98-119] 101 (06/02 0630) Resp:  [17-27] 18 (06/02 0630) BP: (107-139)/(68-88) 139/88 (06/02 0630) SpO2:  [95 %-100 %] 99 % (06/02 0630) Weight:  [88.5 kg] 88.5 kg (06/02 0040)   Physical Exam  Constitutional: Appears  well-developed and well-nourished.  Psych: Affect appropriate to situation, involuntary smiling noted to be baseline side effects from prior psychiatric medications, slightly increased from baseline frequency Eyes: No scleral injection HENT: No oropharyngeal obstruction.  Poor dentition MSK: Left index finger chronic change from remote fracture.  Severe kyphosis which has been developing gradually and runs in his family Cardiovascular: Perfusing extremities well Respiratory: Effort normal, non-labored breathing, nasal cannula in place GI: Soft.  Mildly distended. There is no tenderness.  Skin: Warm dry and intact visible skin, other than a slight bruise on his right knee  Neuro: Mental Status: Patient is awake, alert, oriented to person, place, month, year, and situation. Patient is very tangential history, but follows commands, fluent speech, no neglect Cranial Nerves: II: Visual Fields are full. Pupils are equal, round, and reactive to Bean.   III,IV, VI: EOMI without ptosis or diploplia.  V: Facial sensation is symmetric to [place] touch VII: Facial movement is symmetric when involuntarily smiling, reports he cannot smile voluntarily VIII: hearing is intact to voice X: Uvula elevates symmetrically XI: Shoulder shrug is symmetric. XII: tongue is midline without atrophy or fasciculations.  Motor: Mild to moderate pill-rolling tremor in the left greater than right upper extremity at rest.  Mild 4 to 4+/5 weakness throughout without lateralization.  Mild pronation without drift of the bilateral upper extremities Sensory: Sensation is symmetric to Angelucci touch and temperature in the arms and legs. Deep Tendon Reflexes: 3+ and symmetric in the brachioradialis and patellae.  Cerebellar: FNF and HKS are intact bilaterally Gait:  Deferred    I have reviewed labs in epic and the results pertinent to this consultation are:  Basic Metabolic Panel: Recent Labs  Lab 02/23/23 0044  02/23/23 0541  NA 130* 135  K 3.9 3.6  CL 95* 100  CO2 18* 27  GLUCOSE 140* 111*  BUN 10 7  CREATININE 0.69 0.52*  CALCIUM 8.9 8.8*  MG  --  1.6*    CBC: Recent Labs  Lab 02/23/23 0044  WBC 12.7*  NEUTROABS 9.0*  HGB 11.1*  HCT 34.1*  MCV 94.2  PLT 276    Coagulation Studies: Recent Labs    02/23/23 0044  LABPROT 14.2  INR 1.1    Lactate 9 --> 2.3     I have reviewed the images obtained:  CT Head personally reviewed, agree with radiology, no acute intracranial process   CT Angio Chest PE 1. No pulmonary embolus. 2. Extensive patchy airspace opacity throughout the dependent left upper lobe and both lower lobe consistent with pneumonia. Large amount of retained debris within the distal trachea tracking into both mainstem bronchi consistent highly suggestive of aspiration. 3. Left upper lobe airspace disease obscures the previous left upper lobe nodule. 4. Mild T12 compression deformity may be new from prior PET. 5. Emphysema with chronic bronchial thickening.  Impression: Second event concerning for seizure activity, this time with much less severe electrolyte derangements than prior.  Given his risk of recurrent electrolyte derangement, even if these events were entirely provoked, I do think it is reasonable to start low-dose Keppra since he is tolerating it so well.  Unfortunately will not be able to obtain MRI due to his severe kyphosis (confirmed with MRI technicians at bedside, appreciate their evaluation).  Post syncopal convulsion could be an alternative diagnosis though less likely given his gradually clearing mental status.  Nevertheless will obtain a CTA to evaluate vertebrobasilar system, in addition to CT venogram to assess for any underlying venous thrombosis (risk of clots due to his malignancy).  Delayed phase contrast for CT venogram may also reveal underlying metastatic disease more sensitively than plain head CT  Recommendations: -CTA,  CTV -Continue Keppra 500 mg twice daily -From a neurological perspective if imaging is reassuring and patient is otherwise ready for discharge, okay to complete EEG on an outpatient basis as it would not change my immediate management and will not be available today -Neurology will follow-up CTA and CTV and following for inpatient EEG if patient is admitted for other medical reasons, otherwise we will be available as needed. Recommendations conveyed to primary team via secure chat  [name] MD-PhD [facility] [phone] [facility] coverage for [facility] is from 8 AM to 4 AM in-house and 4 PM to 8 PM by telephone/video. 8 PM to 8 AM emergent questions or overnight urgent questions should be addressed to Teleneurology On-call or [facility] neurohospitalist; contact information can be found on AMION

## 2023-02-23 NOTE — ED Provider Notes (Signed)
[facility] Provider Note    Event Date/Time   First MD Initiated Contact with Patient 02/23/23 0038     (approximate)   History   Unresponsive and Seizures   HPI  Trevor Montgomery is a 62 y.o. male history of lung cancer presents to the ER unresponsive after being found by sister.  Uncertain as to downtime.  She did 1 chest compression when EMS arrived the patient was taken by them and had a witnessed seizure-like episode lasting less than a minute that was generalized tonic-clonic.  He was given Versed.  Found to be drowsy but with spontaneous respirations.  He is currently on chemo has received radiation.  Not any blood thinners.     Physical Exam   Triage Vital Signs: ED Triage Vitals  Enc Vitals Group     BP 02/23/23 0042 125/69     Pulse Rate 02/23/23 0042 (!) 103     Resp 02/23/23 0042 18     Temp 02/23/23 0042 98.6 F (37 C)     Temp Source 02/23/23 0042 Rectal     SpO2 02/23/23 0042 97 %     Weight 02/23/23 0040 195 lb (88.5 kg)     Height 02/23/23 0040 5\' 9"  (1.753 m)     Head Circumference --      Peak Flow --      Pain Score --      Pain Loc --      Pain Edu? --      Excl. in GC? --     Most recent vital signs: Vitals:   02/23/23 0210 02/23/23 0214  BP:    Pulse: (!) 105 (!) 108  Resp: (!) 21 20  Temp:    SpO2: 99% 100%     Constitutional: Chronically ill-appearing Eyes: Conjunctivae are normal.  Pupils are midpoint and reactive. Head: Atraumatic. Nose: No congestion/rhinnorhea. Mouth/Throat: Mucous membranes are moist.   Neck: Painless ROM.  Cardiovascular:   Good peripheral circulation. Respiratory: Normal respiratory effort.  No retractions.  Gastrointestinal: Soft and nontender.  Musculoskeletal:  no deformity Neurologic: Grimaces to painful stimuli. Skin:  Skin is warm, dry and intact. No rash noted. Psychiatric: Mood and affect are normal. Speech and behavior are normal.    ED Results / Procedures / Treatments    Labs (all labs ordered are listed, but only abnormal results are displayed) Labs Reviewed  LACTIC ACID, PLASMA - Abnormal; Notable for the following components:      Result Value   Lactic Acid, Venous >9.0 (*)    All other components within normal limits  COMPREHENSIVE METABOLIC PANEL - Abnormal; Notable for the following components:   Sodium 130 (*)    Chloride 95 (*)    CO2 18 (*)    Glucose, Bld 140 (*)    Anion gap 17 (*)    All other components within normal limits  CBC WITH DIFFERENTIAL/PLATELET - Abnormal; Notable for the following components:   WBC 12.7 (*)    RBC 3.62 (*)    Hemoglobin 11.1 (*)    HCT 34.1 (*)    Neutro Abs 9.0 (*)    Monocytes Absolute 1.1 (*)    Abs Immature Granulocytes 0.10 (*)    All other components within normal limits  BLOOD GAS, VENOUS - Abnormal; Notable for the following components:   pO2, Ven 96 (*)    All other components within normal limits  CULTURE, BLOOD (ROUTINE X 2)  CULTURE, BLOOD (ROUTINE X  2)  PROTIME-INR  APTT  LACTIC ACID, PLASMA  TROPONIN I (HIGH SENSITIVITY)  TROPONIN I (HIGH SENSITIVITY)     EKG  ED ECG REPORT I, Willy Eddy, the attending physician, personally viewed and interpreted this ECG.   Date: 02/23/2023  EKG Time: 2:57  Rate: 100  Rhythm: sinus  Axis: normal  Intervals: normal qt  ST&T Change: nonspecific st abn with concave upward elevation, no depression or reciprocal change    RADIOLOGY Please see ED Course for my review and interpretation.  I personally reviewed all radiographic images ordered to evaluate for the above acute complaints and reviewed radiology reports and findings.  These findings were personally discussed with the patient.  Please see medical record for radiology report.    PROCEDURES:  Critical Care performed: Yes, see critical care procedure note(s)  .Critical Care  Performed by: Willy Eddy, MD Authorized by: Willy Eddy, MD   Critical care  provider statement:    Critical care time (minutes):  50   Critical care was necessary to treat or prevent imminent or life-threatening deterioration of the following conditions:  Respiratory failure and CNS failure or compromise   Critical care was time spent personally by me on the following activities:  Ordering and performing treatments and interventions, ordering and review of laboratory studies, ordering and review of radiographic studies, pulse oximetry, re-evaluation of patient's condition, review of old charts, obtaining history from patient or surrogate, examination of patient, evaluation of patient's response to treatment, discussions with primary provider, discussions with consultants and development of treatment plan with patient or surrogate    MEDICATIONS ORDERED IN ED: Medications  vancomycin (VANCOREADY) IVPB 2000 mg/400 mL (2,000 mg Intravenous New Bag/Given 02/23/23 0209)  midazolam (VERSED) injection 2 mg (0.5 mg Intravenous Given 02/23/23 0141)  rocuronium bromide 100 MG/10ML SOSY (  Return to [facility] 02/23/23 0055)  etomidate (AMIDATE) 2 MG/ML injection (  Return to [facility] 02/23/23 0055)  levETIRAcetam (KEPPRA) IVPB 1500 mg/ 100 mL premix (0 mg Intravenous Stopped 02/23/23 0115)  ceFEPIme (MAXIPIME) 2 g in sodium chloride 0.9 % 100 mL IVPB (0 g Intravenous Stopped 02/23/23 0154)  azithromycin (ZITHROMAX) 500 mg in sodium chloride 0.9 % 250 mL IVPB (0 mg Intravenous Stopped 02/23/23 0224)  sodium chloride 0.9 % bolus 1,000 mL (1,000 mLs Intravenous New Bag/Given 02/23/23 0125)  iohexol (OMNIPAQUE) 350 MG/ML injection 100 mL (100 mLs Intravenous Contrast Given 02/23/23 0159)     IMPRESSION / MDM / ASSESSMENT AND PLAN / ED COURSE  I reviewed the triage vital signs and the nursing notes.                              Differential diagnosis includes, but is not limited to, Dehydration, sepsis, pna, uti, hypoglycemia, cva, drug effect, withdrawal, encephalitis  Patient presenting to the ER  for evaluation of symptoms as described above.  Based on symptoms, risk factors and considered above differential, this presenting complaint could reflect a potentially life-threatening illness therefore the patient will be placed on continuous pulse oximetry and telemetry for monitoring.  Laboratory evaluation will be sent to evaluate for the above complaints.  Given presentation have high suspicion patient is postictal.  Family is on the way.  Is currently protecting not showing signs of active seizure will load with Keppra will order blood work imaging and hold off on intubation at this time.   Clinical Course as of 02/23/23 0300  Sun Feb 23, 2023  0111 Patient does appear to be becoming more alert.  I do suspect he had seizure.  Was given Keppra.  His unresponsiveness likely secondary to postictal period.  His sister and POA is at bedside reporting that does not believe that he would want any invasive or life-prolonging procedures such as intubation or CPR.  She appears to be coming around will continue to closely observe. [PR]  0114 Lactate greater than 9. [PR]  0115 Have ordered broad-spectrum antibiotics as well as IV fluids.  Suspect significantly elevated lactate secondary to his reported seizures.  Cover with antibiotics due to suspected pneumonia versus aspiration as he does have new O2 requirement. [PR]  0124 Patient reassessed.  He is now responding to verbal commands.  Noncommunicative now but continues to improve further supporting suspicion that this was seizure like episode. [PR]  0206 CT head on my review and interpretation without evidence of mass or edema. [PR]  0251 Now the patient is more alert and awake had conversation with him and patient's sister at bedside regarding his goals of care.  After having lengthy discussion they would like to proceed with making patient DNR DNI goals of care would be to keep him comfortable if he were to worsen but they do want antibiotics, treatment  and further workup as medically indicated. [PR]    Clinical Course User Index [PR] Willy Eddy, MD     FINAL CLINICAL IMPRESSION(S) / ED DIAGNOSES   Final diagnoses:  Seizure-like activity (HCC)  Acute on chronic respiratory failure with hypoxia (HCC)  Aspiration pneumonia, unspecified aspiration pneumonia type, unspecified laterality, unspecified part of lung (HCC)     Rx / DC Orders   ED Discharge Orders     None        Note:  This document was prepared using Dragon voice recognition software and may include unintentional dictation errors.    Willy Eddy, MD 02/23/23 0300

## 2023-02-23 NOTE — ED Triage Notes (Signed)
Pt arrived via [facility] from home with reports of pt being unresponsive, pt is normally responsive per wife pt has hx of Lung CA on chemo, wife concerned at home pt was apneic, did minimal CPR at home, per EMS pt had seizure lasting about 15 seconds, pt was initially 85% on RA, placed on 4L [place] up to 96%  Pt received 2.5 mg of IV versed with EMS for seizure activity.  Pt receives his care at [facility] normally per EMS.

## 2023-02-23 NOTE — Progress Notes (Signed)
PHARMACY -  BRIEF ANTIBIOTIC NOTE   Pharmacy has received consult(s) for Vancomycin from an ED provider.  The patient's profile has been reviewed for ht/wt/allergies/indication/available labs.    One time order(s) placed for Vancomycin 2 gm per pt wt: 88.5 kg  Further antibiotics/pharmacy consults should be ordered by admitting physician if indicated.                       Thank you, Otelia Sergeant, PharmD, [facility] 02/23/2023 1:17 AM

## 2023-02-23 NOTE — Assessment & Plan Note (Signed)
Patient on palliative radiation and chemo

## 2023-02-23 NOTE — Progress Notes (Signed)
Pharmacy Antibiotic Note  Domnique Schettler is a 62 y.o. male admitted on 02/23/2023 with possible aspiration pneumonia.  Pharmacy has been consulted for Unasyn dosing.  Plan: Unasyn 3 gm q6hr x 5 days per indication & renal fxn.  Pharmacy will continue to follow and will adjust abx dosing whenever warranted.  Temp (24hrs), Avg:98.6 F (37 C), Min:98.6 F (37 C), Max:98.6 F (37 C)   Recent Labs  Lab 02/23/23 0044  WBC 12.7*  CREATININE 0.69  LATICACIDVEN >9.0*    Estimated Creatinine Clearance: 108.1 mL/min (by C-G formula based on SCr of 0.69 mg/dL).    Allergies  Allergen Reactions   Fluphenazine     Other Reaction(s): Unknown    Antimicrobials this admission: 6/02 Cefepime >> x 1 dose 6/02 Azithromycin >> x 1 dose 6/02 Vancomycin >> x 1 dose 6/02 Unasyn >> x 5 days  Microbiology results: 6/02 BCx: Pending  Thank you for allowing pharmacy to be a part of this patient's care.  Otelia Sergeant, PharmD, [facility] 02/23/2023 3:42 AM

## 2023-02-23 NOTE — Assessment & Plan Note (Addendum)
-   Likely SIADH secondary to lung cancer -Previously treated for sodium 121 (10/2022) with fluid restriction and sodium chloride tablets 1 g 3 times daily -Currently on sodium infusion - Swallow eval and can restart sodium chloride tablets at that time

## 2023-02-23 NOTE — Assessment & Plan Note (Addendum)
Patient likely aspirated when he became unresponsive Unasyn Keep n.p.o. Speech therapy eval IV fluids Supplemental oxygen as needed

## 2023-02-23 NOTE — ED Notes (Signed)
Upon transferring patient to room 38 RN noticed red warm rash on chest, back, and abdomen, MD notified

## 2023-02-23 NOTE — ED Notes (Signed)
ED Provider at bedside. Talking to family at this time.

## 2023-02-24 ENCOUNTER — Inpatient Hospital Stay: Payer: Medicare Other

## 2023-02-24 DIAGNOSIS — J690 Pneumonitis due to inhalation of food and vomit: Secondary | ICD-10-CM

## 2023-02-24 DIAGNOSIS — E876 Hypokalemia: Secondary | ICD-10-CM

## 2023-02-24 DIAGNOSIS — E871 Hypo-osmolality and hyponatremia: Secondary | ICD-10-CM

## 2023-02-24 DIAGNOSIS — R569 Unspecified convulsions: Secondary | ICD-10-CM | POA: Diagnosis not present

## 2023-02-24 DIAGNOSIS — I10 Essential (primary) hypertension: Secondary | ICD-10-CM

## 2023-02-24 LAB — CBC
HCT: 32.5 % — ABNORMAL LOW (ref 39.0–52.0)
Hemoglobin: 10.6 g/dL — ABNORMAL LOW (ref 13.0–17.0)
MCH: 30.9 pg (ref 26.0–34.0)
MCHC: 32.6 g/dL (ref 30.0–36.0)
MCV: 94.8 fL (ref 80.0–100.0)
Platelets: 208 10*3/uL (ref 150–400)
RBC: 3.43 MIL/uL — ABNORMAL LOW (ref 4.22–5.81)
RDW: 14.9 % (ref 11.5–15.5)
WBC: 8.1 10*3/uL (ref 4.0–10.5)
nRBC: 0 % (ref 0.0–0.2)

## 2023-02-24 LAB — BASIC METABOLIC PANEL
Anion gap: 8 (ref 5–15)
BUN: 5 mg/dL — ABNORMAL LOW (ref 6–20)
CO2: 30 mmol/L (ref 22–32)
Calcium: 8.6 mg/dL — ABNORMAL LOW (ref 8.9–10.3)
Chloride: 100 mmol/L (ref 98–111)
Creatinine, Ser: 0.51 mg/dL — ABNORMAL LOW (ref 0.61–1.24)
GFR, Estimated: >60 mL/min (ref >60)
Glucose, Bld: 92 mg/dL (ref 70–99)
Potassium: 3.1 mmol/L — ABNORMAL LOW (ref 3.5–5.1)
Sodium: 138 mmol/L (ref 135–145)

## 2023-02-24 LAB — CULTURE, BLOOD (ROUTINE X 2)

## 2023-02-24 MED ORDER — POTASSIUM CHLORIDE CRYS ER 20 MEQ PO TBCR
20.0000 meq | EXTENDED_RELEASE_TABLET | Freq: Two times a day (BID) | ORAL | Status: DC
  Administered 2023-02-24 – 2023-02-25 (×3): 20 meq via ORAL
  Filled 2023-02-24 (×3): qty 1

## 2023-02-24 MED ORDER — IPRATROPIUM-ALBUTEROL 0.5-2.5 (3) MG/3ML IN SOLN: 3.0000 mL | RESPIRATORY_TRACT | Status: DC | PRN

## 2023-02-24 MED ORDER — ALBUTEROL SULFATE (2.5 MG/3ML) 0.083% IN NEBU: 3.0000 mL | INHALATION_SOLUTION | Freq: Four times a day (QID) | RESPIRATORY_TRACT | Status: DC | PRN

## 2023-02-24 MED ORDER — BENZTROPINE MESYLATE 0.5 MG PO TABS
0.5000 mg | ORAL_TABLET | Freq: Two times a day (BID) | ORAL | Status: DC
  Administered 2023-02-24 – 2023-02-25 (×3): 0.5 mg via ORAL
  Filled 2023-02-24 (×3): qty 1

## 2023-02-24 MED ORDER — LISINOPRIL 10 MG PO TABS
10.0000 mg | ORAL_TABLET | Freq: Every day | ORAL | Status: DC
  Administered 2023-02-24 – 2023-02-25 (×2): 10 mg via ORAL
  Filled 2023-02-24 (×2): qty 1

## 2023-02-24 MED ORDER — DM-GUAIFENESIN ER 30-600 MG PO TB12
1.0000 | ORAL_TABLET | Freq: Two times a day (BID) | ORAL | Status: DC
  Administered 2023-02-24 – 2023-02-25 (×3): 1 via ORAL
  Filled 2023-02-24 (×3): qty 1

## 2023-02-24 MED ORDER — SIMVASTATIN 20 MG PO TABS
20.0000 mg | ORAL_TABLET | Freq: Every day | ORAL | Status: DC
  Administered 2023-02-24: 20 mg via ORAL
  Filled 2023-02-24: qty 1

## 2023-02-24 MED ORDER — PROPRANOLOL HCL 10 MG PO TABS
5.0000 mg | ORAL_TABLET | Freq: Two times a day (BID) | ORAL | Status: DC
  Administered 2023-02-24 – 2023-02-25 (×3): 5 mg via ORAL
  Filled 2023-02-24 (×3): qty 0.5

## 2023-02-24 MED ORDER — BENZONATATE 100 MG PO CAPS
100.0000 mg | ORAL_CAPSULE | Freq: Four times a day (QID) | ORAL | Status: DC | PRN
  Administered 2023-02-24: 100 mg via ORAL
  Filled 2023-02-24: qty 1

## 2023-02-24 NOTE — Procedures (Signed)
Routine EEG Report  Trevor Montgomery is a 62 y.o. male with a history of seizure who is undergoing an EEG to evaluate for seizures.  Report: This EEG was acquired with electrodes placed according to the International 10-20 electrode system (including Fp1, Fp2, F3, F4, C3, C4, P3, P4, O1, O2, T3, T4, T5, T6, A1, A2, Fz, Cz, Pz). The following electrodes were missing or displaced: none.  The occipital dominant rhythm was 7-8 Hz with intermittent bifrontal slowing. This activity is reactive to stimulation. Drowsiness was manifested by background fragmentation; deeper stages of sleep were identified by K complexes and sleep spindles. There were no interictal epileptiform discharges. There were no electrographic seizures identified. There was no abnormal response to photic stimulation or hyperventilation.   Impression and clinical correlation: This EEG was obtained while awake and asleep and is abnormal due to mild diffuse slowing and intermittent superimposed bifrontal slowing indicative of global cerebral dysfunction. Epileptiform abnormalities were not seen during this recording.  Bing Neighbors, MD [facility] [phone]  If 7pm- 7am, please page neurology on call as listed in AMION.

## 2023-02-24 NOTE — Progress Notes (Signed)
Eeg done 

## 2023-02-24 NOTE — Progress Notes (Signed)
Speech Language Pathology Treatment: Dysphagia  Patient Details Name: Essa Handshoe MRN: [id number] DOB: December 22, 1960 Today's Date: 02/24/2023 Time: 4098-1191 SLP Time Calculation (min) (ACUTE ONLY): 8 min  Assessment / Plan / Recommendation Clinical Impression  Pt seen for clinical swallowing re-evaluation. Pt alert and cooperative. Tangential speech noted. Pt given trials of solid, puree, and thin liquids. Encouragement needed for self-feeding. Pt demonstrated an intact oral swallow. Pharyngeal swallow appeared [facility] per clinical assessment. Recommend diet upgrade to regular diet with thin liquids with safe swallowing strategies/aspiration precautions as outlined below. SLP to s/o as pt has no acute SLP needs at this time. Pt, RN, and MD made aware of results, recommendations, and SLP POC.    HPI HPI: Per H&P "Oluwafemi Loury is a 62 y.o. 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  Wife called EMS and started chest compressions shortly before their arrival.  Patient had a generalized tonic-clonic seizure en route for which he was administered Versed.  ED course and data review: On arrival he was tachycardic and tachypneic but otherwise normal vitals and was more responsive.  Labs resulted with WBC 12,000, lactic acid greater than 9.Hemoglobin 11.1 up from 6.8 a couple weeks prior.  Sodium 130, bicarb 18, troponin 8.  CTA chest negative for PE but showing findings suggestive of aspiration as outlined below:  IMPRESSION:  1. No pulmonary embolus.  2. Extensive patchy airspace opacity throughout the dependent left  upper lobe and both lower lobe consistent with pneumonia. Large  amount of retained debris within the distal trachea tracking into  both mainstem bronchi consistent highly  suggestive of aspiration.  3. Left upper lobe airspace disease obscures the previous left upper  lobe nodule.  4. Mild T12 compression deformity may be new from prior PET.  5. Emphysema with chronic bronchial thickening.     Head CT unrevealing     Patient was treated with cefepime vancomycin and azithromycin for aspiration pneumonia.  He was loaded with Keppra."      SLP Plan  All goals met      Recommendations for follow up therapy are one component of a multi-disciplinary discharge planning process, led by the attending physician.  Recommendations may be updated based on patient status, additional functional criteria and insurance authorization.    Recommendations  Diet recommendations: Regular;Thin liquid Medication Administration:  (as tolerated) Supervision: Patient able to self feed;Staff to assist with self feeding;Intermittent supervision to cue for compensatory strategies Compensations: Slow rate;Small sips/bites Postural Changes and/or Swallow Maneuvers: Out of bed for meals;Seated upright 90 degrees;Upright 30-60 min after meal                  Oral care BID;Staff/trained caregiver to provide oral care    (defer to OT/PT) Dysphagia, oral phase (R13.11)     All goals met   Clyde Canterbury, M.S., CCC-SLP Speech-Language Pathologist [address] [phone] Arnette Felts)   Woodroe Chen  02/24/2023, 9:49 AM

## 2023-02-24 NOTE — Progress Notes (Signed)
  Progress Note   Patient: Trevor Montgomery ZOX:[id number] DOB: Feb 03, 1961 DOA: 02/23/2023     1 DOS: the patient was seen and examined on 02/24/2023   Brief hospital course: Bence Mi is a 62 y.o. 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 He had generalized tonic-clonic seizure en route got Versed. Initial WBC, lactic elevated, CTA chest showed patchy airspace opacity throughout the dependent left upper lobe and both lower lobe consistent with pneumonia, possible aspiration. He is admitted for further management and evaluation.  Assessment and Plan: * Seizure (HCC) Unresponsiveness, likely secondary to postictal state --resolved Patient is able to answer me, no more episodes of seizures Seizure possibly due to non compliance with meds. Continue Keppra 500 q12. Ativan as needed. Neurology evaluation appreciated. EEG pending. CTA unremarkable. Fall, seizure and aspiration precautions  Aspiration pneumonia [facility]) Patient likely aspirated when he became unresponsive. SLP evaluated, ok for diet. Continue Unasyn. Stop IV fluids. Mucinex, Tessalon PRN ordered. Supplemental oxygen as needed to maintain saturation above 92%.  Lactic acidosis Due to seizures, low suspicion for sepsis. Lactic acid trended down.  Hyponatremia Likely SIADH secondary to lung cancer Sodium 138 today.  Hypokalemia- Will restart oral home potassium supplements.  Schizophrenia (HCC) Home meds will be resumed.  Hypertension BP stable. Propranolol, lisinorpil restarted. Hydralazine as needed  Chronic obstructive lung disease (HCC) Chronic respiratory failure with hypoxia DuoNeb as needed Continue supplemental oxygen  Small cell lung cancer [facility]) Patient on palliative radiation  and chemo  DVT prophylaxis- Lovenox. Fall, aspiration and seizure precautions. Nursing supportive care.      Subjective: Patient is seen and examined today morning. He complains of sore throat, cough. Able to tolerate diet per speech and swallow eval. No seizure episodes overnight.   Physical Exam: Vitals:   02/24/23 0545 02/24/23 0600 02/24/23 0800 02/24/23 1045  BP:   119/75 122/80  Pulse:  86 88 85  Resp:  17 20 19   Temp: 98.6 F (37 C)   98.3 F (36.8 C)  TempSrc: Oral   Oral  SpO2:  94% 95% 97%  Weight:      Height:       General -Elderly middle aged Caucasian male, no apparent distress Heart - S1, S2 hear, bradycardia. Lungs, clear, bibasal rales, diffuse rhonchi Abdomen- soft, non tender. Neuro- AAO, non focal exam.  Data Reviewed:  CBC, BMP, Lactic acid, cultures  Family Communication: Patient understands and agrees with the current plan, will plan to talk to her sister over phone.  Disposition: Status is: Inpatient Remains inpatient appropriate because: seizure episode, electrolyte management, IV hydration, antibiotics.  Planned Discharge Destination: Home with Home Health    Time spent: 45 minutes  Author: Marcelino Duster, MD 02/24/2023 1:29 PM  For on call review [email or web address].

## 2023-02-25 ENCOUNTER — Other Ambulatory Visit: Payer: Self-pay | Admitting: Oncology

## 2023-02-25 DIAGNOSIS — C3490 Malignant neoplasm of unspecified part of unspecified bronchus or lung: Secondary | ICD-10-CM

## 2023-02-25 DIAGNOSIS — J9621 Acute and chronic respiratory failure with hypoxia: Secondary | ICD-10-CM

## 2023-02-25 LAB — BASIC METABOLIC PANEL
Anion gap: 12 (ref 5–15)
BUN: 9 mg/dL (ref 6–20)
CO2: 27 mmol/L (ref 22–32)
Calcium: 8.9 mg/dL (ref 8.9–10.3)
Chloride: 98 mmol/L (ref 98–111)
Creatinine, Ser: 0.49 mg/dL — ABNORMAL LOW (ref 0.61–1.24)
GFR, Estimated: >60 mL/min (ref >60)
Glucose, Bld: 80 mg/dL (ref 70–99)
Potassium: 3.8 mmol/L (ref 3.5–5.1)
Sodium: 137 mmol/L (ref 135–145)

## 2023-02-25 LAB — CBC
HCT: 30.4 % — ABNORMAL LOW (ref 39.0–52.0)
Hemoglobin: 10.2 g/dL — ABNORMAL LOW (ref 13.0–17.0)
MCH: 30.8 pg (ref 26.0–34.0)
MCHC: 33.6 g/dL (ref 30.0–36.0)
MCV: 91.8 fL (ref 80.0–100.0)
Platelets: 202 10*3/uL (ref 150–400)
RBC: 3.31 MIL/uL — ABNORMAL LOW (ref 4.22–5.81)
RDW: 14.5 % (ref 11.5–15.5)
WBC: 8.7 10*3/uL (ref 4.0–10.5)
nRBC: 0 % (ref 0.0–0.2)

## 2023-02-25 LAB — CULTURE, BLOOD (ROUTINE X 2)

## 2023-02-25 MED ORDER — BENZONATATE 100 MG PO CAPS: 100.0000 mg | ORAL_CAPSULE | Freq: Four times a day (QID) | ORAL | 0 refills | Status: DC | PRN

## 2023-02-25 MED ORDER — MENTHOL 3 MG MT LOZG: 1.0000 | LOZENGE | OROMUCOSAL | 0 refills | Status: DC | PRN

## 2023-02-25 MED ORDER — AMOXICILLIN-POT CLAVULANATE 875-125 MG PO TABS: 1.0000 | ORAL_TABLET | Freq: Two times a day (BID) | ORAL | 0 refills | Status: AC

## 2023-02-25 MED ORDER — LISINOPRIL 10 MG PO TABS: 10.0000 mg | ORAL_TABLET | Freq: Every day | ORAL | 3 refills | Status: DC

## 2023-02-25 MED ORDER — DM-GUAIFENESIN ER 30-600 MG PO TB12: 1.0000 | ORAL_TABLET | Freq: Two times a day (BID) | ORAL | 0 refills | Status: AC

## 2023-02-25 MED ORDER — ORAL CARE MOUTH RINSE: 15.0000 mL | OROMUCOSAL | 0 refills | Status: DC | PRN

## 2023-02-25 MED ORDER — LEVETIRACETAM 500 MG PO TABS: 500.0000 mg | ORAL_TABLET | Freq: Two times a day (BID) | ORAL | 3 refills | Status: DC

## 2023-02-25 NOTE — Discharge Summary (Signed)
Physician Discharge Summary   Patient: Trevor Montgomery MRN: [id number] DOB: 09/04/1961  Admit date:     02/23/2023  Discharge date: 02/25/23  Discharge Physician: Marcelino Duster   PCP: Barbette Reichmann, MD   Recommendations at discharge:    PCP follow up in 1 week Oncology follow up for palliative chemo as scheduled. Neurology follow up.  Discharge Diagnoses: Principal Problem:   Seizure (HCC) Active Problems:   Aspiration pneumonia (HCC)   Lactic acidosis   Hyponatremia   Schizophrenia (HCC)   Hypertension   Metastatic lung cancer (metastasis from lung to other site) (HCC)   Hypokalemia   Chronic obstructive lung disease (HCC)  Resolved Problems:   * No resolved hospital problems. *  Hospital Course: Trevor Montgomery is a 62 y.o. 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 He had generalized tonic-clonic seizure en route got Versed. Initial WBC, lactic elevated, CTA chest showed patchy airspace opacity throughout the dependent left upper lobe and both lower lobe consistent with pneumonia, possible aspiration. He is admitted for further management and evaluation.   During hospital stay patient is started on IV Keppra therapy for seizure disorder, broad-spectrum IV antibiotics for pneumonia, gentle IV hydration kept n.p.o.  Patient is seen by neurologist who recommended CTA/CTV head and neck as he is unable to get MRI brain due to severe kyphosis, EEG and advised to continue Keppra.  Patient is seen by speech and swallow who cleared him for diet.  Patient does have throat discomfort, cough.  Started lozenges, Mucinex, mouthwash.  CTA head and neck reviewed shows no large vessel occlusion, multiple sclerotic lesions in the cranial and thoracic spine  consistent with metastatic disease.  EEG is unremarkable.  Patient does not have any more seizure episodes while on Keppra therapy.  I discussed with patient's sister regarding the metastatic findings on CT imaging and advised her to follow-up with oncology for palliative chemoradiation therapy.  He worked with PT and is hemodynamically stable to be discharged home with home health services.  New prescriptions sent to pharmacy.  Advised to follow-up with PCP, oncology, neurology upon discharge.  Patient and his sister understand and agree with the discharge plan.       Consultants: Neurology Procedures performed: EEG Disposition: Home health Diet recommendation:  Discharge Diet Orders (From admission, onward)     Start     Ordered   02/25/23 0000  Diet - low sodium heart healthy        02/25/23 1527           Cardiac diet DISCHARGE MEDICATION: Allergies as of 02/25/2023       Reactions   Fluphenazine    Other Reaction(s): Unknown        Medication List     STOP taking these medications    dexamethasone 4 MG tablet Commonly known as: DECADRON   lisinopril-hydrochlorothiazide 20-12.5 MG tablet Commonly known as: ZESTORETIC   potassium chloride 10 MEQ tablet Commonly known as: KLOR-CON       TAKE these medications    albuterol 108 (90 Base) MCG/ACT inhaler Commonly known as: VENTOLIN HFA Inhale 2 puffs into the lungs every 6 (six) hours as needed for wheezing or shortness of breath.   amoxicillin-clavulanate 875-125 MG tablet Commonly known as: AUGMENTIN Take 1  tablet by mouth 2 (two) times daily for 3 days.   benzonatate 100 MG capsule Commonly known as: TESSALON Take 1 capsule (100 mg total) by mouth 4 (four) times daily as needed for cough.   benztropine 0.5 MG tablet Commonly known as: COGENTIN Take 0.5 mg by mouth 2 (two) times daily.   bisacodyl 5 MG EC tablet Commonly known as: DULCOLAX Take 1 tablet (5 mg total) by mouth daily as needed for  moderate constipation.   dextromethorphan-guaiFENesin 30-600 MG 12hr tablet Commonly known as: MUCINEX DM Take 1 tablet by mouth 2 (two) times daily for 10 days.   docusate sodium 100 MG capsule Commonly known as: COLACE Take 1 capsule (100 mg total) by mouth 2 (two) times daily as needed for mild constipation.   fluPHENAZine 5 MG tablet Commonly known as: PROLIXIN Take 5 mg by mouth daily.   guaiFENesin 600 MG 12 hr tablet Commonly known as: MUCINEX Take 1 tablet (600 mg total) by mouth 2 (two) times daily as needed for cough.   HYDROcodone-acetaminophen 5-325 MG tablet Commonly known as: NORCO/VICODIN Take 1-2 tablets by mouth every 6 (six) hours as needed for moderate pain.   ipratropium-albuterol 0.5-2.5 (3) MG/3ML Soln Commonly known as: DUONEB Take 3 mLs by nebulization every 4 (four) hours as needed.   levETIRAcetam 500 MG tablet Commonly known as: Keppra Take 1 tablet (500 mg total) by mouth 2 (two) times daily.   lidocaine-prilocaine cream Commonly known as: EMLA Apply to affected area once   lisinopril 10 MG tablet Commonly known as: ZESTRIL Take 1 tablet (10 mg total) by mouth daily. Start taking on: February 26, 2023   meloxicam 15 MG tablet Commonly known as: MOBIC Take 15 mg by mouth daily.   menthol-cetylpyridinium 3 MG lozenge Commonly known as: CEPACOL Take 1 lozenge (3 mg total) by mouth as needed for sore throat.   mouth rinse Liqd solution 15 mLs by Mouth Rinse route as needed (oral care).   Multi-Vitamin tablet Take 1 tablet by mouth daily.   potassium chloride SA 20 MEQ tablet Commonly known as: KLOR-CON M Take 1 tablet (20 mEq total) by mouth 2 (two) times daily.   propranolol 10 MG tablet Commonly known as: INDERAL Take 0.5 tablets (5 mg total) by mouth 2 (two) times daily.   simvastatin 20 MG tablet Commonly known as: ZOCOR Take 20 mg by mouth at bedtime.   sodium chloride 1 g tablet Take 1 tablet (1 g total) by mouth 3 (three)  times daily.   Vitamin E 400 units Tabs Take 400 Units by mouth daily.        Follow-up Information     [facility]. Call in 2 day(s).   Why: appointment for 03/19/23 11.15am Dr. Wilnette Kales information: [address] [place] [address] [phone]               Discharge Exam: Ceasar Mons Weights   02/23/23 0040  Weight: 88.5 kg   General -Elderly middle aged Caucasian male, no apparent distress Heart - S1, S2 hear, bradycardia. Lungs, clear, bibasal rales, diffuse rhonchi Abdomen- soft, non tender. Neuro- AAO, non focal exam.  Condition at discharge: stable  The results of significant diagnostics from this hospitalization (including imaging, microbiology, ancillary and laboratory) are listed below for reference.   Imaging Studies: EEG adult  Result Date: 07-Mar-2023 Jefferson Fuel, MD     March 07, 2023  3:47 PM Routine EEG Report Valek Kuramoto is a 62  y.o. male with a history of seizure who is undergoing an EEG to evaluate for seizures. Report: This EEG was acquired with electrodes placed according to the International 10-20 electrode system (including Fp1, Fp2, F3, F4, C3, C4, P3, P4, O1, O2, T3, T4, T5, T6, A1, A2, Fz, Cz, Pz). The following electrodes were missing or displaced: none. The occipital dominant rhythm was 7-8 Hz with intermittent bifrontal slowing. This activity is reactive to stimulation. Drowsiness was manifested by background fragmentation; deeper stages of sleep were identified by K complexes and sleep spindles. There were no interictal epileptiform discharges. There were no electrographic seizures identified. There was no abnormal response to photic stimulation or hyperventilation. Impression and clinical correlation: This EEG was obtained while awake and asleep and is abnormal due to mild diffuse slowing and intermittent superimposed bifrontal slowing indicative of global cerebral dysfunction. Epileptiform  abnormalities were not seen during this recording. Bing Neighbors, MD [facility] [phone] If 7pm- 7am, please page neurology on call as listed in AMION.   CT VENOGRAM HEAD  Result Date: 02/23/2023 CLINICAL DATA:  Dural venous sinus thrombosis suspected. Question metastatic disease. Unresponsive. Seizure. EXAM: CT VENOGRAM HEAD TECHNIQUE: Venographic phase images of the brain were obtained following the administration of intravenous contrast. Multiplanar reformats and maximum intensity projections were generated. RADIATION DOSE REDUCTION: This exam was performed according to the departmental dose-optimization program which includes automated exposure control, adjustment of the mA and/or kV according to patient size and/or use of iterative reconstruction technique. CONTRAST:  75mL OMNIPAQUE IOHEXOL 350 MG/ML SOLN COMPARISON:  CT head and CTA head and neck 02/23/2023. FINDINGS: The dural sinuses patent. Straight sinus deep cerebral veins intact. Transverse sinuses codominant. Cortical veins are unremarkable. No pathologic enhancement present in the brain. IMPRESSION: No evidence of dural venous sinus thrombosis. Normal postcontrast images of the brain. No evidence for metastatic disease to the brain. Electronically Signed   By: Marin Roberts M.D.   On: 02/23/2023 14:16   CT ANGIO HEAD NECK W WO CM  Result Date: 02/23/2023 CLINICAL DATA:  Stroke/TIA, determine embolic source. Unresponsive, seizure, history of lung cancer. EXAM: CT ANGIOGRAPHY HEAD AND NECK WITH AND WITHOUT CONTRAST TECHNIQUE: Multidetector CT imaging of the head and neck was performed using the standard protocol during bolus administration of intravenous contrast. Multiplanar CT image reconstructions and MIPs were obtained to evaluate the vascular anatomy. Carotid stenosis measurements (when applicable) are obtained utilizing NASCET criteria, using the distal internal carotid diameter as the denominator. RADIATION DOSE  REDUCTION: This exam was performed according to the departmental dose-optimization program which includes automated exposure control, adjustment of the mA and/or kV according to patient size and/or use of iterative reconstruction technique. CONTRAST:  75mL OMNIPAQUE IOHEXOL 350 MG/ML SOLN COMPARISON:  None Available. FINDINGS: CT HEAD FINDINGS Brain: No acute infarct, hemorrhage, or mass lesion is present. No significant white matter lesions are present. The ventricles are of normal size. No significant extraaxial fluid collection is present. The brainstem and cerebellum are within normal limits. Midline structures are within normal limits. Vascular: No hyperdense vessel or unexpected calcification. Skull: Calvarium is intact. No focal lytic or blastic lesions are present. No significant extracranial soft tissue lesion is present. Sinuses/Orbits: The paranasal sinuses and mastoid air cells are clear. The globes and orbits are within normal limits. Review of the MIP images confirms the above findings CTA NECK FINDINGS Aortic arch: 3 vessel arch configuration is present. Great vessel origins are within normal limits. No significant atherosclerotic disease or stenosis is present. Right  carotid system: The right common carotid artery is within normal limits. The bifurcation is unremarkable. The cervical right ICA is normal. Left carotid system: The left common carotid artery within normal limits. Bifurcation is unremarkable. Cervical left ICA is normal. Vertebral arteries: The vertebral arteries are codominant. Both vertebral arteries originate from the arteries without significant stenosis. Skeleton: Mild rightward curvature is present in the cervical spine. Sclerotic lesions are present the C5 vertebral body, on the right at C6 vertebral body, superiorly T2 vertebral body on left T3 vertebral body. More ill-defined diffuse sclerotic lesion is present at T5 vertebral body. No pathologic fractures are present. Other  neck: Soft tissues the neck are otherwise unremarkable. Salivary glands are within normal limits. Thyroid is normal. No significant adenopathy is present. No focal mucosal or submucosal lesions are present. Upper chest: Centrilobular emphysematous changes are present. No focal nodule or mass lesion is present. Review of the MIP images confirms the above findings CTA HEAD FINDINGS Anterior circulation: Minimal atherosclerotic calcifications are present within the cavernous internal carotid arteries bilaterally. Significant stenosis is present through the ICA terminus. The A1 and M1 segments are normal. The anterior communicating artery is patent. MCA bifurcations are within normal limits. There is some attenuation of distal ACA and MCA branch vessels without a significant proximal stenosis or occlusion. Posterior circulation: Origins are visualized and normal. The vertebrobasilar junction and basilar artery are normal. The superior cerebellar arteries are patent. Left posterior cerebral artery originates basilar tip. Right posterior communicating artery and right P1 segment contribute to the right posterior communicating artery. The PCA branch vessels are normal bilaterally. Venous sinuses: The dural sinuses are patent. The straight sinus and deep cerebral veins are intact. Cortical veins are within normal limits. No significant vascular malformation is evident. Anatomic variants: None. Review of the MIP images confirms the above findings IMPRESSION: 1. No large vessel occlusion. 2. Minimal atherosclerotic calcifications within the cavernous internal carotid arteries bilaterally without significant stenosis. 3. Mild distal small vessel disease without a significant proximal stenosis, aneurysm, or branch vessel occlusion within the Circle of Willis. 4. Normal CTA of the neck. 5. Multiple sclerotic lesions in the cervical and thoracic spine consistent with metastatic disease. No pathologic fractures are present. 6.   Emphysema (ICD10-J43.9). Electronically Signed   By: Marin Roberts M.D.   On: 02/23/2023 14:14   CT Angio Chest PE W and/or Wo Contrast  Result Date: 02/23/2023 CLINICAL DATA:  Pulmonary embolism (PE) suspected, high prob History of lung cancer on chemotherapy. EXAM: CT ANGIOGRAPHY CHEST WITH CONTRAST TECHNIQUE: Multidetector CT imaging of the chest was performed using the standard protocol during bolus administration of intravenous contrast. Multiplanar CT image reconstructions and MIPs were obtained to evaluate the vascular anatomy. RADIATION DOSE REDUCTION: This exam was performed according to the departmental dose-optimization program which includes automated exposure control, adjustment of the mA and/or kV according to patient size and/or use of iterative reconstruction technique. CONTRAST:  OMNIPAQUE IOHEXOL 350 MG/ML SOLN COMPARISON:  Chest radiograph earlier today. PET CT 02/02/2013 FINDINGS: Cardiovascular: There are no filling defects within the pulmonary arteries to suggest pulmonary embolus. Aortic atherosclerosis and tortuosity, no dissection or acute aortic findings. The heart is normal in size. No pericardial effusion. Right chest port in place. Mediastinum/Nodes: Scattered mediastinal lymph nodes are not enlarged by size criteria. There is soft tissue density at the left hilum which may be treatment related change. No right hilar adenopathy. Small hiatal hernia with wall thickening of the distal esophagus. Lungs/Pleura: Extensive patchy  airspace opacity throughout the dependent left upper lobe and central left lower lobe. Left upper lobe airspace disease obscures the previous left upper lobe pulmonary nodule. Lesser airspace disease within the dependent right lower lobe. Large amount of retained debris within the distal trachea tracking into both mainstem bronchi. Emphysema with bronchial thickening. Chronic bandlike atelectasis in the right lower lobe. No significant pleural  effusion. Upper Abdomen: Punctate gallstone. Left renal cysts. No further imaging follow-up is needed. No acute upper abdominal findings. Musculoskeletal: Exaggerated thoracic kyphosis. Diffuse sclerotic metastatic disease, stable from prior PET. Large Schmorl's node involving inferior endplate of L1 is stable. There is a mild T12 compression deformity that may be new. Review of the MIP images confirms the above findings. IMPRESSION: 1. No pulmonary embolus. 2. Extensive patchy airspace opacity throughout the dependent left upper lobe and both lower lobe consistent with pneumonia. Large amount of retained debris within the distal trachea tracking into both mainstem bronchi consistent highly suggestive of aspiration. 3. Left upper lobe airspace disease obscures the previous left upper lobe nodule. 4. Mild T12 compression deformity may be new from prior PET. 5. Emphysema with chronic bronchial thickening. Aortic Atherosclerosis (ICD10-I70.0) and Emphysema (ICD10-J43.9). Electronically Signed   By: Narda Rutherford M.D.   On: 02/23/2023 02:28   CT HEAD WO CONTRAST ( )  Result Date: 02/23/2023 CLINICAL DATA:  Unresponsive, seizure, history of lung cancer EXAM: CT HEAD WITHOUT CONTRAST TECHNIQUE: Contiguous axial images were obtained from the base of the skull through the vertex without intravenous contrast. RADIATION DOSE REDUCTION: This exam was performed according to the departmental dose-optimization program which includes automated exposure control, adjustment of the mA and/or kV according to patient size and/or use of iterative reconstruction technique. COMPARISON:  None Available.  11/12/2022 FINDINGS: Brain: No evidence of acute infarction, hemorrhage, mass, mass effect, or midline shift. No hydrocephalus or extra-axial fluid collection. Gray-white differentiation is preserved. Unchanged size of the ventricles. The basilar cisterns are patent. Vascular: No hyperdense vessel. Skull: Negative for fracture or  focal lesion. Sinuses/Orbits: Mucosal thickening in the ethmoid air cells. No acute finding in the orbits. Other: The mastoid air cells are well aerated. IMPRESSION: No acute intracranial process. Electronically Signed   By: Wiliam Ke M.D.   On: 02/23/2023 02:12   DG Chest Port 1 View  Result Date: 02/23/2023 CLINICAL DATA:  Question of sepsis. History of lung cancer on chemo. Apnea at home. Seizure. EXAM: PORTABLE CHEST 1 VIEW COMPARISON:  12/08/2022 FINDINGS: Borderline heart size with mild pulmonary vascular congestion. Perihilar infiltration, greater on the left, may be due to pneumonia or edema. No pleural effusions. No pneumothorax. Mediastinal contours appear intact. Power port type central venous catheter with tip over the cavoatrial junction region. Old rib fractures. IMPRESSION: Borderline heart size with pulmonary vascular congestion and perihilar infiltration, greater on the left. Electronically Signed   By: Burman Nieves M.D.   On: 02/23/2023 01:06   NM PET Image Restag (PS) Skull Base To Thigh  Result Date: 02/05/2023 CLINICAL DATA:  Subsequent treatment strategy for small cell lung cancer. EXAM: NUCLEAR MEDICINE PET SKULL BASE TO THIGH TECHNIQUE: 10.5 mCi F-18 FDG was injected intravenously. Full-ring PET imaging was performed from the skull base to thigh after the radiotracer. CT data was obtained and used for attenuation correction and anatomic localization. Fasting blood glucose: 83 mg/dl COMPARISON:  Multiple exams, including PET-CT 11/04/2022 FINDINGS: Mediastinal blood pool activity: SUV max 1.6 Liver activity: SUV max NA NECK: No significant abnormal hypermetabolic activity in this  region. Incidental CT findings: None. CHEST: Previous hypermetabolic AP window lymph node has essentially resolved without significant residual metabolic activity in this vicinity. The previous left suprahilar activity has generally resolved although there is a left upper lobe nodule measuring 0.8 cm on  image 44 of series 5 with maximum SUV of 3.3, potentially from malignancy. Distal esophageal activity is likely physiologic, maximum SUV 3.4. Incidental CT findings: Airway thickening is present, suggesting bronchitis or reactive airways disease. Emphysema. Small type 1 hiatal hernia. Right Port-A-Cath tip: SVC. ABDOMEN/PELVIS: The right adrenal nodule has resolved with no persistent hypermetabolic activity. Incidental CT findings: Photopenic left renal cysts. Mild aortoiliac atherosclerosis. SKELETON: Prior widespread osseous metastatic lesions are now sclerotic and demonstrate no significant abnormal metabolic activity above baseline indicating successful treatment. However, there is some mildly accentuated diffuse marrow activity in the skeleton, probably from granulocyte stimulation. Incidental CT findings: Fused sacroiliac joints. Prominent thoracic kyphosis. New lucency along the inferior endplate of L1, probably a Schmorl's node although potentially associated with the surrounding treated metastatic lesion, but without hypermetabolic activity. IMPRESSION: 1. In general the pattern is of marked improvement, with resolution of prior thoracic adenopathy, as well as resolution of abnormal activity in the previous widespread osseous metastatic disease which is now sclerotic. 2. There is a 0.8 cm left upper lobe nodule with maximum SUV of 3.3, potentially from malignancy. 3. The prior right adrenal nodule has resolved. 4. Mildly accentuated diffuse marrow activity in the skeleton, probably from granulocyte stimulation. 5. New lucency along the inferior endplate of L1, probably a Schmorl's node although potentially associated with the surrounding treated metastatic lesion, but without hypermetabolic activity. 6. Airway thickening is present, suggesting bronchitis or reactive airways disease. Emphysema. 7. Small type 1 hiatal hernia. Distal esophageal activity is likely physiologic. 8. Aortic atherosclerosis. Aortic  Atherosclerosis (ICD10-I70.0) and Emphysema (ICD10-J43.9). Electronically Signed   By: Gaylyn Rong M.D.   On: 02/05/2023 11:45    Microbiology: Results for orders placed or performed during the hospital encounter of 02/23/23  Blood Culture (routine x 2)     Status: None (Preliminary result)   Collection Time: 02/23/23  1:01 AM   Specimen: BLOOD  Result Value Ref Range Status   Specimen Description BLOOD BLOOD LEFT ARM  Final   Special Requests   Final    BOTTLES DRAWN AEROBIC AND ANAEROBIC Blood Culture results may not be optimal due to an excessive volume of blood received in culture bottles   Culture   Final    NO GROWTH 2 DAYS Performed at [facility], [address], [place], [place] [address]    Report Status PENDING  Incomplete  Blood Culture (routine x 2)     Status: None (Preliminary result)   Collection Time: 02/23/23  1:01 AM   Specimen: BLOOD  Result Value Ref Range Status   Specimen Description BLOOD BLOOD RIGHT HAND  Final   Special Requests   Final    BOTTLES DRAWN AEROBIC AND ANAEROBIC Blood Culture results may not be optimal due to an excessive volume of blood received in culture bottles   Culture   Final    NO GROWTH 2 DAYS Performed at [facility], [address] [place], [place] [address]    Report Status PENDING  Incomplete    Labs: CBC: Recent Labs  Lab 02/23/23 0044 02/24/23 0545 02/25/23 0513  WBC 12.7* 8.1 8.7  NEUTROABS 9.0*  --   --   HGB 11.1* 10.6* 10.2*  HCT 34.1* 32.5* 30.4*  MCV 94.2 94.8 91.8  PLT 276 208 202   Basic Metabolic Panel: Recent Labs  Lab 02/23/23 0044 02/23/23 0541 02/24/23 0545 02/25/23 0513  NA 130* 135 138 137  K 3.9 3.6 3.1* 3.8  CL 95* 100 100 98  CO2 18* 27 30 27   GLUCOSE 140* 111* 92 80  BUN 10 7 5* 9  CREATININE 0.69 0.52* 0.51* 0.49*  CALCIUM 8.9 8.8* 8.6* 8.9  MG  --  1.6*  --   --    Liver Function Tests: Recent Labs  Lab 02/23/23 0044  AST 38  ALT 12  ALKPHOS 94   BILITOT 0.8  PROT 7.2  ALBUMIN 4.1   CBG: No results for input(s): "GLUCAP" in the last 168 hours.  Discharge time spent: greater than 30 minutes.  Signed: Marcelino Duster, MD [facility] 02/25/2023

## 2023-02-25 NOTE — Evaluation (Signed)
Physical Therapy Evaluation Patient Details Name: Trevor Montgomery MRN: [id number] DOB: 05-07-1961 Today's Date: 02/25/2023  History of Present Illness  Pt is a 62 y.o. 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 He had generalized tonic-clonic seizure en route got Versed. Initial WBC, lactic elevated, CTA chest showed patchy airspace opacity throughout the dependent left upper lobe and both lower lobe consistent with pneumonia, possible aspiration.   Clinical Impression  Pt alert, agreeable to PT, family at bedside, denied pain. Pt stated at baseline (with some family assistance) he is ambulatory with a quad cane, able to perform ADLs without assistance, 3L of O2 via [place], 1 fall in the last 6 months. Pt did have PT after last hospital admission.  He performed bed mobility with supervision, and able to sit with good balance. Sit <> stand without RW and with it, some improvement in safety noted with RW use. He was able to ambulate without RW as well, but noted for some mild improvement with it. Pt agreeable to utilize rollator at baseline due to pt reported fatigue/weakness during this admission.  Overall the patient demonstrated deficits (see "PT Problem List") that impede the patient's functional abilities, safety, and mobility and would benefit from skilled PT intervention. Recommendation is continue skilled PT intervention to maximize pt function and independence.      Recommendations for follow up therapy are one component of a multi-disciplinary discharge planning process, led by the attending physician.  Recommendations may be updated based on patient status, additional functional criteria and insurance authorization.  Follow Up Recommendations       Assistance  Recommended at Discharge Intermittent Supervision/Assistance  Patient can return home with the following  Assistance with cooking/housework;Assist for transportation;Direct supervision/assist for medications management;Help with stairs or ramp for entrance    Equipment Recommendations None recommended by PT (pt hasrollator at home, agreeable to use at discharge)  Recommendations for Other Services       Functional Status Assessment Patient has had a recent decline in their functional status and demonstrates the ability to make significant improvements in function in a reasonable and predictable amount of time.     Precautions / Restrictions Precautions Precautions: Fall Restrictions Weight Bearing Restrictions: No      Mobility  Bed Mobility Overal bed mobility: Needs Assistance Bed Mobility: Supine to Sit     Supine to sit: Supervision          Transfers Overall transfer level: Needs assistance Equipment used: None, Rolling walker (2 wheels) Transfers: Sit to/from Stand Sit to Stand: Min guard, Supervision           General transfer comment: CGA without RW, supervision with    Ambulation/Gait Ambulation/Gait assistance: Min guard Gait Distance (Feet): 70 Feet Assistive device: Rolling walker (2 wheels)         General Gait Details: decreased gait velocity, some mild improvement in steadiness with RW support  Stairs            Wheelchair Mobility    Modified Rankin (Stroke Patients Only)       Balance Overall balance assessment: Needs assistance Sitting-balance support: Feet supported Sitting balance-Leahy Scale: Good       Standing balance-Leahy Scale: Good  Pertinent Vitals/Pain Pain Assessment Pain Assessment: No/denies pain    Home Living Family/patient expects to be discharged to:: Private residence Living Arrangements: Other relatives (sister) Available Help at Discharge:  Family;Available 24 hours/day Type of Home: House Home Access: Stairs to enter Entrance Stairs-Rails: None Entrance Stairs-Number of Steps: 3 + 1   Home Layout: One level Home Equipment: Rollator (4 wheels);Shower seat;Grab bars - tub/shower;Cane - quad      Prior Function Prior Level of Function : Independent/Modified Independent             Mobility Comments: 1 fall in the last 6 months, uses quad cane ADLs Comments: Ind with ADLs     Hand Dominance        Extremity/Trunk Assessment   Upper Extremity Assessment Upper Extremity Assessment: Overall WFL for tasks assessed    Lower Extremity Assessment Lower Extremity Assessment: Generalized weakness       Communication   Communication: No difficulties  Cognition Arousal/Alertness: Awake/alert Behavior During Therapy: WFL for tasks assessed/performed Overall Cognitive Status: Within Functional Limits for tasks assessed                                          General Comments      Exercises     Assessment/Plan    PT Assessment Patient needs continued PT services  PT Problem List Decreased strength;Decreased mobility;Decreased activity tolerance;Decreased balance       PT Treatment Interventions DME instruction;Therapeutic activities;Therapeutic exercise;Patient/family education;Gait training;Stair training;Balance training;Functional mobility training;Neuromuscular re-education    PT Goals (Current goals can be found in the Care Plan section)  Acute Rehab PT Goals Patient Stated Goal: to go home PT Goal Formulation: With patient Time For Goal Achievement: 03/11/23 Potential to Achieve Goals: Good    Frequency Min 2X/week     Co-evaluation               AM-PAC PT "6 Clicks" Mobility  Outcome Measure Help needed turning from your back to your side while in a flat bed without using bedrails?: None Help needed moving from lying on your back to sitting on the side of a flat  bed without using bedrails?: None Help needed moving to and from a bed to a chair (including a wheelchair)?: None Help needed standing up from a chair using your arms (e.g., wheelchair or bedside chair)?: None Help needed to walk in hospital room?: A Little Help needed climbing 3-5 steps with a railing? : A Little 6 Click Score: 22    End of Session Equipment Utilized During Treatment: Gait belt;Other (comment) (O2) Activity Tolerance: Patient tolerated treatment well Patient left: with chair alarm set;in chair;with call bell/phone within reach;with family/visitor present Nurse Communication: Mobility status PT Visit Diagnosis: Other abnormalities of gait and mobility (R26.89);Muscle weakness (generalized) (M62.81)    Time: 6213-0865 PT Time Calculation (min) (ACUTE ONLY): 22 min   Charges:   PT Evaluation $PT Eval Low Complexity: 1 Low PT Treatments $Therapeutic Activity: 8-22 mins        Olga Coaster PT, DPT 3:26 PM,02/25/23

## 2023-02-25 NOTE — TOC Initial Note (Addendum)
Transition of Care [facility]) - Initial/Assessment Note    Patient Details  Name: Trevor Montgomery MRN: [id number] Date of Birth: July 21, 1961  [facility] CM/SW Contact:    Truddie Hidden, RN Phone Number: 02/25/2023, 4:22 PM  Clinical Narrative:                 Patient is agreeable to [facility] per physical therapy recommendation.  He has used [facility] in the past and would like them again Referral sent and accepted by Barbara Cower from [facility]. Patient's sister will pick him up.  TOC signing off.          Patient Goals and CMS Choice            Expected Discharge Plan and Services         Expected Discharge Date: 02/25/23                                    Prior Living Arrangements/Services                       Activities of Daily Living Home Assistive Devices/Equipment: Eyeglasses ADL Screening (condition at time of admission) Patient's cognitive ability adequate to safely complete daily activities?: Yes Is the patient deaf or have difficulty hearing?: No Does the patient have difficulty seeing, even when wearing glasses/contacts?: No Does the patient have difficulty concentrating, remembering, or making decisions?: No Patient able to express need for assistance with ADLs?: Yes Does the patient have difficulty dressing or bathing?: No Independently performs ADLs?: No Communication: Independent Dressing (OT): Independent Grooming: Independent Feeding: Independent Bathing: Needs assistance Is this a change from baseline?: Change from baseline, expected to last <3 days Toileting: Needs assistance Is this a change from baseline?: Change from baseline, expected to last <3 days In/Out Bed: Needs assistance Is this a change from baseline?: Change from baseline, expected to last <3 days Walks in Home: Needs assistance Is this a change from baseline?: Change from baseline, expected to last <3 days Does the patient have difficulty walking or climbing  stairs?: Yes Weakness of Legs: Both Weakness of Arms/Hands: None  Permission Sought/Granted                  Emotional Assessment              Admission diagnosis:  Seizure-like activity (HCC) [R56.9] New onset seizure (HCC) [R56.9] Acute on chronic respiratory failure with hypoxia (HCC) [J96.21] Aspiration pneumonia, unspecified aspiration pneumonia type, unspecified laterality, unspecified part of lung (HCC) [J69.0] Patient Active Problem List   Diagnosis Date Noted   Seizure (HCC) 02/23/2023   Aspiration pneumonia (HCC) 02/23/2023   Lactic acidosis 02/23/2023   Chronic obstructive lung disease (HCC) 02/23/2023   Chronic hypoxemic respiratory failure (HCC) 12/11/2022   Thrombocytopenia (HCC) 11/13/2022   Hypokalemia 11/13/2022   Syncope and collapse 11/12/2022   Metastatic lung cancer (metastasis from lung to other site) [facility]) 10/29/2022   Palliative care encounter 10/16/2022   Sepsis due to pneumonia (HCC) 10/11/2022   Lung mass 10/11/2022   Hypertension 10/10/2022   Tobacco abuse 10/10/2022   Schizophrenia (HCC) 10/10/2022   Acute on chronic respiratory failure with hypoxia (HCC) 10/10/2022   Cough 10/10/2022   Hyponatremia 10/10/2022   CAP (community acquired pneumonia) 10/10/2022   Centrilobular emphysema (HCC) 07/30/2018   PCP:  Barbette Reichmann, MD Pharmacy:   [facility] - [place],  [place] - [address] [place] [address] Phone: [phone] Fax: [phone]     Social Determinants of Health (SDOH) Social History: SDOH Screenings   Food Insecurity: No Food Insecurity (02/24/2023)  Housing: Low Risk  (02/24/2023)  Transportation Needs: No Transportation Needs (02/24/2023)  Utilities: Not At Risk (02/24/2023)  Alcohol Screen: Low Risk  (11/11/2022)  Depression (PHQ2-9): Low Risk  (11/11/2022)  Financial Resource Strain: Low Risk  (11/11/2022)  Physical Activity: Inactive (11/11/2022)  Social Connections: Unknown (11/11/2022)   Tobacco Use: Medium Risk (02/23/2023)   SDOH Interventions:     Readmission Risk Interventions     No data to display

## 2023-02-26 ENCOUNTER — Ambulatory Visit: Payer: Medicare Other

## 2023-02-26 ENCOUNTER — Inpatient Hospital Stay: Payer: Medicare Other | Admitting: Oncology

## 2023-02-26 ENCOUNTER — Inpatient Hospital Stay: Payer: Medicare Other

## 2023-02-26 ENCOUNTER — Other Ambulatory Visit: Payer: Medicare Other

## 2023-02-26 ENCOUNTER — Ambulatory Visit: Payer: Medicare Other | Admitting: Oncology

## 2023-02-26 LAB — HIV ANTIBODY (ROUTINE TESTING W REFLEX): HIV Screen 4th Generation wRfx: NONREACTIVE

## 2023-02-26 LAB — CULTURE, BLOOD (ROUTINE X 2)

## 2023-02-27 ENCOUNTER — Telehealth: Payer: Self-pay | Admitting: *Deleted

## 2023-02-27 LAB — CULTURE, BLOOD (ROUTINE X 2)

## 2023-02-27 NOTE — Telephone Encounter (Signed)
Patient sister called reporting that patient recently discharged from hosital and is not feeling well and wanted to know if she needs to take him back to hospital. When asked what did not not feel well was, she said that he has been to bathroom 5 times today and when asked if he has watery diarrhea and she did not know but after prodding her more for answers, she went to speak with patient who states he has watery diarrhea and abdominal cramping. He is not eating much today, but did until this morning. She does not have a thermometer to see if he has a fever. When asked if he can wait to be seen tomorrow, she said if not she will call EMS to take him to ER again. Please advise

## 2023-02-28 ENCOUNTER — Inpatient Hospital Stay: Payer: Medicare Other

## 2023-02-28 ENCOUNTER — Other Ambulatory Visit: Payer: Self-pay

## 2023-02-28 ENCOUNTER — Encounter: Payer: Self-pay | Admitting: Hospice and Palliative Medicine

## 2023-02-28 ENCOUNTER — Inpatient Hospital Stay: Payer: Medicare Other | Attending: Oncology

## 2023-02-28 ENCOUNTER — Inpatient Hospital Stay (HOSPITAL_BASED_OUTPATIENT_CLINIC_OR_DEPARTMENT_OTHER): Payer: Medicare Other | Admitting: Hospice and Palliative Medicine

## 2023-02-28 ENCOUNTER — Inpatient Hospital Stay: Payer: Medicare Other | Admitting: Oncology

## 2023-02-28 VITALS — BP 147/93 | HR 80 | Temp 97.3°F | Resp 18

## 2023-02-28 DIAGNOSIS — D649 Anemia, unspecified: Secondary | ICD-10-CM | POA: Diagnosis not present

## 2023-02-28 DIAGNOSIS — R197 Diarrhea, unspecified: Secondary | ICD-10-CM

## 2023-02-28 DIAGNOSIS — E860 Dehydration: Secondary | ICD-10-CM | POA: Insufficient documentation

## 2023-02-28 DIAGNOSIS — Z9981 Dependence on supplemental oxygen: Secondary | ICD-10-CM | POA: Diagnosis not present

## 2023-02-28 DIAGNOSIS — J449 Chronic obstructive pulmonary disease, unspecified: Secondary | ICD-10-CM | POA: Diagnosis not present

## 2023-02-28 DIAGNOSIS — R569 Unspecified convulsions: Secondary | ICD-10-CM | POA: Insufficient documentation

## 2023-02-28 DIAGNOSIS — Z87891 Personal history of nicotine dependence: Secondary | ICD-10-CM | POA: Insufficient documentation

## 2023-02-28 DIAGNOSIS — C3490 Malignant neoplasm of unspecified part of unspecified bronchus or lung: Secondary | ICD-10-CM

## 2023-02-28 DIAGNOSIS — R600 Localized edema: Secondary | ICD-10-CM | POA: Diagnosis not present

## 2023-02-28 DIAGNOSIS — C7951 Secondary malignant neoplasm of bone: Secondary | ICD-10-CM | POA: Diagnosis not present

## 2023-02-28 DIAGNOSIS — Z5112 Encounter for antineoplastic immunotherapy: Secondary | ICD-10-CM | POA: Diagnosis present

## 2023-02-28 DIAGNOSIS — C3482 Malignant neoplasm of overlapping sites of left bronchus and lung: Secondary | ICD-10-CM | POA: Insufficient documentation

## 2023-02-28 DIAGNOSIS — E871 Hypo-osmolality and hyponatremia: Secondary | ICD-10-CM | POA: Diagnosis not present

## 2023-02-28 DIAGNOSIS — Z79620 Long term (current) use of immunosuppressive biologic: Secondary | ICD-10-CM | POA: Diagnosis not present

## 2023-02-28 LAB — CMP (CANCER CENTER ONLY)
ALT: 25 U/L (ref 0–44)
AST: 42 U/L — ABNORMAL HIGH (ref 15–41)
Albumin: 3.7 g/dL (ref 3.5–5.0)
Alkaline Phosphatase: 75 U/L (ref 38–126)
Anion gap: 4 — ABNORMAL LOW (ref 5–15)
BUN: 6 mg/dL (ref 6–20)
CO2: 30 mmol/L (ref 22–32)
Calcium: 9.1 mg/dL (ref 8.9–10.3)
Chloride: 96 mmol/L — ABNORMAL LOW (ref 98–111)
Creatinine: 0.4 mg/dL — ABNORMAL LOW (ref 0.61–1.24)
GFR, Estimated: >60 mL/min (ref >60)
Glucose, Bld: 98 mg/dL (ref 70–99)
Potassium: 4 mmol/L (ref 3.5–5.1)
Sodium: 130 mmol/L — ABNORMAL LOW (ref 135–145)
Total Bilirubin: 0.6 mg/dL (ref 0.3–1.2)
Total Protein: 6.5 g/dL (ref 6.5–8.1)

## 2023-02-28 LAB — CBC WITH DIFFERENTIAL/PLATELET
Abs Immature Granulocytes: 0.01 10*3/uL (ref 0.00–0.07)
Basophils Absolute: 0 10*3/uL (ref 0.0–0.1)
Basophils Relative: 1 %
Eosinophils Absolute: 0.3 10*3/uL (ref 0.0–0.5)
Eosinophils Relative: 6 %
HCT: 28.7 % — ABNORMAL LOW (ref 39.0–52.0)
Hemoglobin: 9.7 g/dL — ABNORMAL LOW (ref 13.0–17.0)
Immature Granulocytes: 0 %
Lymphocytes Relative: 14 %
Lymphs Abs: 0.7 10*3/uL (ref 0.7–4.0)
MCH: 30.5 pg (ref 26.0–34.0)
MCHC: 33.8 g/dL (ref 30.0–36.0)
MCV: 90.3 fL (ref 80.0–100.0)
Monocytes Absolute: 0.5 10*3/uL (ref 0.1–1.0)
Monocytes Relative: 10 %
Neutro Abs: 3.6 10*3/uL (ref 1.7–7.7)
Neutrophils Relative %: 69 %
Platelets: 202 10*3/uL (ref 150–400)
RBC: 3.18 MIL/uL — ABNORMAL LOW (ref 4.22–5.81)
RDW: 14.1 % (ref 11.5–15.5)
WBC: 5.2 10*3/uL (ref 4.0–10.5)
nRBC: 0 % (ref 0.0–0.2)

## 2023-02-28 LAB — CULTURE, BLOOD (ROUTINE X 2): Culture: NO GROWTH

## 2023-02-28 LAB — MAGNESIUM: Magnesium: 1.5 mg/dL — ABNORMAL LOW (ref 1.7–2.4)

## 2023-02-28 MED ORDER — SODIUM CHLORIDE 0.9% FLUSH
10.0000 mL | Freq: Once | INTRAVENOUS | Status: AC
  Administered 2023-02-28: 10 mL via INTRAVENOUS
  Filled 2023-02-28: qty 10

## 2023-02-28 MED ORDER — HEPARIN SOD (PORK) LOCK FLUSH 100 UNIT/ML IV SOLN
500.0000 [IU] | Freq: Once | INTRAVENOUS | Status: AC
  Administered 2023-02-28: 500 [IU] via INTRAVENOUS
  Filled 2023-02-28: qty 5

## 2023-02-28 MED ORDER — MAGNESIUM SULFATE 2 GM/50ML IV SOLN
2.0000 g | Freq: Once | INTRAVENOUS | Status: AC
  Administered 2023-02-28: 2 g via INTRAVENOUS
  Filled 2023-02-28: qty 50

## 2023-02-28 MED ORDER — SODIUM CHLORIDE 0.9 % IV SOLN
INTRAVENOUS | Status: DC
  Filled 2023-02-28 (×2): qty 250

## 2023-02-28 NOTE — Telephone Encounter (Signed)
Apt with Dr. Orlie Dakin today

## 2023-02-28 NOTE — Progress Notes (Signed)
Symptom Management [facility] at [facility] [phone] [phone]  Patient Care Team: Barbette Reichmann, MD as PCP - General (Internal Medicine) Glory Buff, RN as Oncology Nurse Navigator   NAME OF PATIENT: Trevor Montgomery  [id number]  1960-11-22   DATE OF VISIT: 02/28/23  REASON FOR CONSULT: Trevor Montgomery is a 62 y.o. 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  INTERVAL HISTORY: Patient was hospitalized 02/23/2023 to 02/25/2023 with altered mental status after being found unresponsive for an unknown period of time.  He had a tonic-clonic seizure and route to the hospital.  Workup was consistent for pneumonia.  Patient was started on Keppra and broad-spectrum IV antibiotics.  Patient presents [facility] today for evaluation of diarrhea.  Patient says that he started having diarrhea yesterday but that it has essentially resolved today.  He feels a little dizziness when he stands.  He admits to poor oral intake but otherwise denies any symptomatic complaints or concerns today.  No fever or chills.  No nausea or vomiting.  No abdominal distention or pain.  Denies GU symptoms.  Patient last received maintenance Tecentriq on 02/05/2023.    PAST MEDICAL HISTORY: Past Medical History:  Diagnosis Date   Anxiety    Arthritis    Emphysema of lung (HCC)    HTN (hypertension)    Hyperlipemia    Schizoaffective disorder, bipolar type (HCC)    Substance abuse (HCC)     PAST SURGICAL HISTORY:  Past Surgical History:  Procedure Laterality Date   FINGER SURGERY     IR IMAGING GUIDED PORT INSERTION  11/04/2022   VIDEO BRONCHOSCOPY WITH ENDOBRONCHIAL ULTRASOUND Left 10/11/2022   Procedure: VIDEO BRONCHOSCOPY WITH ENDOBRONCHIAL ULTRASOUND;  Surgeon: Raechel Chute, MD;  Location: [facility] ORS;  Service: Pulmonary;  Laterality: Left;    HEMATOLOGY/ONCOLOGY HISTORY:  Oncology History  Metastatic lung cancer (metastasis from lung to other site) (HCC)  10/29/2022 Initial Diagnosis   Small cell lung cancer (HCC)   10/29/2022 Cancer Staging   Staging form: Lung, AJCC 8th Edition - Clinical: Stage IVA (cT4, cN2, cM1a) - Signed by Jeralyn Ruths, MD on 10/29/2022   11/12/2022 -  Chemotherapy   Patient is on Treatment Plan : LUNG SCLC Carboplatin + Etoposide + Atezolizumab Induction q21d x 4 cycles / Atezolizumab Maintenance q21d       ALLERGIES:  is allergic to fluphenazine.  MEDICATIONS:  Current Outpatient Medications  Medication Sig Dispense Refill   albuterol (VENTOLIN HFA) 108 (90 Base) MCG/ACT inhaler Inhale 2 puffs into the lungs every 6 (six) hours as needed for wheezing or shortness of breath.     amoxicillin-clavulanate (AUGMENTIN) 875-125 MG tablet Take 1 tablet by mouth 2 (two) times daily for 3 days. 6 tablet 0   benzonatate (TESSALON) 100 MG capsule Take 1 capsule (100 mg total) by mouth 4 (four) times daily as needed for cough. 20 capsule 0   benztropine (COGENTIN) 0.5 MG tablet Take 0.5 mg by mouth 2 (two) times daily.     bisacodyl (DULCOLAX) 5 MG EC tablet Take 1 tablet (5 mg total) by mouth daily as needed for moderate constipation. 30 tablet 0   dextromethorphan-guaiFENesin (MUCINEX DM) 30-600 MG 12hr tablet Take 1 tablet by mouth 2 (two) times daily for 10 days. 20 tablet 0   docusate  sodium (COLACE) 100 MG capsule Take 1 capsule (100 mg total) by mouth 2 (two) times daily as needed for mild constipation. 10 capsule 0   fluPHENAZine (PROLIXIN) 5 MG tablet Take 5 mg by mouth daily.     guaiFENesin (MUCINEX) 600 MG 12 hr tablet Take 1 tablet (600 mg total) by mouth 2 (two) times daily as needed for cough. 30 tablet 0   HYDROcodone-acetaminophen (NORCO/VICODIN) 5-325 MG tablet Take 1-2 tablets by mouth every 6 (six) hours as needed  for moderate pain. (Patient not taking: Reported on 02/23/2023) 30 tablet 0   ipratropium-albuterol (DUONEB) 0.5-2.5 (3) MG/3ML SOLN Take 3 mLs by nebulization every 4 (four) hours as needed. 360 mL 0   levETIRAcetam (KEPPRA) 500 MG tablet Take 1 tablet (500 mg total) by mouth 2 (two) times daily. 60 tablet 3   lidocaine-prilocaine (EMLA) cream Apply to affected area once 30 g 3   lisinopril (ZESTRIL) 10 MG tablet Take 1 tablet (10 mg total) by mouth daily. 30 tablet 3   meloxicam (MOBIC) 15 MG tablet Take 15 mg by mouth daily.     menthol-cetylpyridinium (CEPACOL) 3 MG lozenge Take 1 lozenge (3 mg total) by mouth as needed for sore throat. 30 tablet 0   Mouthwashes (MOUTH RINSE) LIQD solution 15 mLs by Mouth Rinse route as needed (oral care). 118 mL 0   Multiple Vitamin (MULTI-VITAMIN) tablet Take 1 tablet by mouth daily.     potassium chloride SA (KLOR-CON M) 20 MEQ tablet Take 1 tablet (20 mEq total) by mouth 2 (two) times daily. 60 tablet 0   propranolol (INDERAL) 10 MG tablet Take 0.5 tablets (5 mg total) by mouth 2 (two) times daily. 60 tablet 0   simvastatin (ZOCOR) 20 MG tablet Take 20 mg by mouth at bedtime.     sodium chloride 1 g tablet Take 1 tablet (1 g total) by mouth 3 (three) times daily. 30 tablet 2   Vitamin E 400 units TABS Take 400 Units by mouth daily.     No current facility-administered medications for this visit.   Facility-Administered Medications Ordered in Other Visits  Medication Dose Route Frequency Provider Last Rate Last Admin   heparin lock flush 100 unit/mL  500 Units Intravenous Once Doral Digangi, Daryl Eastern, NP        VITAL SIGNS: There were no vitals taken for this visit. There were no vitals filed for this visit.  Estimated body mass index is 28.8 kg/m as calculated from the following:   Height as of 02/23/23: 5\' 9"  (1.753 m).   Weight as of 02/23/23: 195 lb (88.5 kg).  LABS: CBC:    Component Value Date/Time   WBC 8.7 02/25/2023 0513   HGB 10.2 (L)  02/25/2023 0513   HGB 8.9 (L) 12/09/2022 1305   HCT 30.4 (L) 02/25/2023 0513   PLT 202 02/25/2023 0513   PLT 329 12/09/2022 1305   MCV 91.8 02/25/2023 0513   NEUTROABS 9.0 (H) 02/23/2023 0044   LYMPHSABS 2.1 02/23/2023 0044   MONOABS 1.1 (H) 02/23/2023 0044   EOSABS 0.4 02/23/2023 0044   BASOSABS 0.1 02/23/2023 0044   Comprehensive Metabolic Panel:    Component Value Date/Time   NA 137 02/25/2023 0513   K 3.8 02/25/2023 0513   CL 98 02/25/2023 0513   CO2 27 02/25/2023 0513   BUN 9 02/25/2023 0513   CREATININE 0.49 (L) 02/25/2023 0513   CREATININE 0.47 (L) 12/09/2022 1305   GLUCOSE 80 02/25/2023 0513   CALCIUM 8.9  02/25/2023 0513   AST 38 02/23/2023 0044   AST 26 12/09/2022 1305   ALT 12 02/23/2023 0044   ALT 22 12/09/2022 1305   ALKPHOS 94 02/23/2023 0044   BILITOT 0.8 02/23/2023 0044   BILITOT 0.5 12/09/2022 1305   PROT 7.2 02/23/2023 0044   ALBUMIN 4.1 02/23/2023 0044    RADIOGRAPHIC STUDIES: EEG adult  Result Date: 03-09-23 Jefferson Fuel, MD     03-09-23  3:47 PM Routine EEG Report Henok Delozier is a 62 y.o. male with a history of seizure who is undergoing an EEG to evaluate for seizures. Report: This EEG was acquired with electrodes placed according to the International 10-20 electrode system (including Fp1, Fp2, F3, F4, C3, C4, P3, P4, O1, O2, T3, T4, T5, T6, A1, A2, Fz, Cz, Pz). The following electrodes were missing or displaced: none. The occipital dominant rhythm was 7-8 Hz with intermittent bifrontal slowing. This activity is reactive to stimulation. Drowsiness was manifested by background fragmentation; deeper stages of sleep were identified by K complexes and sleep spindles. There were no interictal epileptiform discharges. There were no electrographic seizures identified. There was no abnormal response to photic stimulation or hyperventilation. Impression and clinical correlation: This EEG was obtained while awake and asleep and is abnormal due to mild diffuse  slowing and intermittent superimposed bifrontal slowing indicative of global cerebral dysfunction. Epileptiform abnormalities were not seen during this recording. Bing Neighbors, MD [facility] [phone] If 7pm- 7am, please page neurology on call as listed in AMION.   CT VENOGRAM HEAD  Result Date: 02/23/2023 CLINICAL DATA:  Dural venous sinus thrombosis suspected. Question metastatic disease. Unresponsive. Seizure. EXAM: CT VENOGRAM HEAD TECHNIQUE: Venographic phase images of the brain were obtained following the administration of intravenous contrast. Multiplanar reformats and maximum intensity projections were generated. RADIATION DOSE REDUCTION: This exam was performed according to the departmental dose-optimization program which includes automated exposure control, adjustment of the mA and/or kV according to patient size and/or use of iterative reconstruction technique. CONTRAST:  75mL OMNIPAQUE IOHEXOL 350 MG/ML SOLN COMPARISON:  CT head and CTA head and neck 02/23/2023. FINDINGS: The dural sinuses patent. Straight sinus deep cerebral veins intact. Transverse sinuses codominant. Cortical veins are unremarkable. No pathologic enhancement present in the brain. IMPRESSION: No evidence of dural venous sinus thrombosis. Normal postcontrast images of the brain. No evidence for metastatic disease to the brain. Electronically Signed   By: Marin Roberts M.D.   On: 02/23/2023 14:16   CT ANGIO HEAD NECK W WO CM  Result Date: 02/23/2023 CLINICAL DATA:  Stroke/TIA, determine embolic source. Unresponsive, seizure, history of lung cancer. EXAM: CT ANGIOGRAPHY HEAD AND NECK WITH AND WITHOUT CONTRAST TECHNIQUE: Multidetector CT imaging of the head and neck was performed using the standard protocol during bolus administration of intravenous contrast. Multiplanar CT image reconstructions and MIPs were obtained to evaluate the vascular anatomy. Carotid stenosis measurements (when applicable) are  obtained utilizing NASCET criteria, using the distal internal carotid diameter as the denominator. RADIATION DOSE REDUCTION: This exam was performed according to the departmental dose-optimization program which includes automated exposure control, adjustment of the mA and/or kV according to patient size and/or use of iterative reconstruction technique. CONTRAST:  75mL OMNIPAQUE IOHEXOL 350 MG/ML SOLN COMPARISON:  None Available. FINDINGS: CT HEAD FINDINGS Brain: No acute infarct, hemorrhage, or mass lesion is present. No significant white matter lesions are present. The ventricles are of normal size. No significant extraaxial fluid collection is present. The brainstem and cerebellum are within normal limits.  Midline structures are within normal limits. Vascular: No hyperdense vessel or unexpected calcification. Skull: Calvarium is intact. No focal lytic or blastic lesions are present. No significant extracranial soft tissue lesion is present. Sinuses/Orbits: The paranasal sinuses and mastoid air cells are clear. The globes and orbits are within normal limits. Review of the MIP images confirms the above findings CTA NECK FINDINGS Aortic arch: 3 vessel arch configuration is present. Great vessel origins are within normal limits. No significant atherosclerotic disease or stenosis is present. Right carotid system: The right common carotid artery is within normal limits. The bifurcation is unremarkable. The cervical right ICA is normal. Left carotid system: The left common carotid artery within normal limits. Bifurcation is unremarkable. Cervical left ICA is normal. Vertebral arteries: The vertebral arteries are codominant. Both vertebral arteries originate from the arteries without significant stenosis. Skeleton: Mild rightward curvature is present in the cervical spine. Sclerotic lesions are present the C5 vertebral body, on the right at C6 vertebral body, superiorly T2 vertebral body on left T3 vertebral body. More  ill-defined diffuse sclerotic lesion is present at T5 vertebral body. No pathologic fractures are present. Other neck: Soft tissues the neck are otherwise unremarkable. Salivary glands are within normal limits. Thyroid is normal. No significant adenopathy is present. No focal mucosal or submucosal lesions are present. Upper chest: Centrilobular emphysematous changes are present. No focal nodule or mass lesion is present. Review of the MIP images confirms the above findings CTA HEAD FINDINGS Anterior circulation: Minimal atherosclerotic calcifications are present within the cavernous internal carotid arteries bilaterally. Significant stenosis is present through the ICA terminus. The A1 and M1 segments are normal. The anterior communicating artery is patent. MCA bifurcations are within normal limits. There is some attenuation of distal ACA and MCA branch vessels without a significant proximal stenosis or occlusion. Posterior circulation: Origins are visualized and normal. The vertebrobasilar junction and basilar artery are normal. The superior cerebellar arteries are patent. Left posterior cerebral artery originates basilar tip. Right posterior communicating artery and right P1 segment contribute to the right posterior communicating artery. The PCA branch vessels are normal bilaterally. Venous sinuses: The dural sinuses are patent. The straight sinus and deep cerebral veins are intact. Cortical veins are within normal limits. No significant vascular malformation is evident. Anatomic variants: None. Review of the MIP images confirms the above findings IMPRESSION: 1. No large vessel occlusion. 2. Minimal atherosclerotic calcifications within the cavernous internal carotid arteries bilaterally without significant stenosis. 3. Mild distal small vessel disease without a significant proximal stenosis, aneurysm, or branch vessel occlusion within the Circle of Willis. 4. Normal CTA of the neck. 5. Multiple sclerotic lesions  in the cervical and thoracic spine consistent with metastatic disease. No pathologic fractures are present. 6.  Emphysema (ICD10-J43.9). Electronically Signed   By: Marin Roberts M.D.   On: 02/23/2023 14:14   CT Angio Chest PE W and/or Wo Contrast  Result Date: 02/23/2023 CLINICAL DATA:  Pulmonary embolism (PE) suspected, high prob History of lung cancer on chemotherapy. EXAM: CT ANGIOGRAPHY CHEST WITH CONTRAST TECHNIQUE: Multidetector CT imaging of the chest was performed using the standard protocol during bolus administration of intravenous contrast. Multiplanar CT image reconstructions and MIPs were obtained to evaluate the vascular anatomy. RADIATION DOSE REDUCTION: This exam was performed according to the departmental dose-optimization program which includes automated exposure control, adjustment of the mA and/or kV according to patient size and/or use of iterative reconstruction technique. CONTRAST:  OMNIPAQUE IOHEXOL 350 MG/ML SOLN COMPARISON:  Chest radiograph  earlier today. PET CT 02/02/2013 FINDINGS: Cardiovascular: There are no filling defects within the pulmonary arteries to suggest pulmonary embolus. Aortic atherosclerosis and tortuosity, no dissection or acute aortic findings. The heart is normal in size. No pericardial effusion. Right chest port in place. Mediastinum/Nodes: Scattered mediastinal lymph nodes are not enlarged by size criteria. There is soft tissue density at the left hilum which may be treatment related change. No right hilar adenopathy. Small hiatal hernia with wall thickening of the distal esophagus. Lungs/Pleura: Extensive patchy airspace opacity throughout the dependent left upper lobe and central left lower lobe. Left upper lobe airspace disease obscures the previous left upper lobe pulmonary nodule. Lesser airspace disease within the dependent right lower lobe. Large amount of retained debris within the distal trachea tracking into both mainstem bronchi. Emphysema  with bronchial thickening. Chronic bandlike atelectasis in the right lower lobe. No significant pleural effusion. Upper Abdomen: Punctate gallstone. Left renal cysts. No further imaging follow-up is needed. No acute upper abdominal findings. Musculoskeletal: Exaggerated thoracic kyphosis. Diffuse sclerotic metastatic disease, stable from prior PET. Large Schmorl's node involving inferior endplate of L1 is stable. There is a mild T12 compression deformity that may be new. Review of the MIP images confirms the above findings. IMPRESSION: 1. No pulmonary embolus. 2. Extensive patchy airspace opacity throughout the dependent left upper lobe and both lower lobe consistent with pneumonia. Large amount of retained debris within the distal trachea tracking into both mainstem bronchi consistent highly suggestive of aspiration. 3. Left upper lobe airspace disease obscures the previous left upper lobe nodule. 4. Mild T12 compression deformity may be new from prior PET. 5. Emphysema with chronic bronchial thickening. Aortic Atherosclerosis (ICD10-I70.0) and Emphysema (ICD10-J43.9). Electronically Signed   By: Narda Rutherford M.D.   On: 02/23/2023 02:28   CT HEAD WO CONTRAST ( )  Result Date: 02/23/2023 CLINICAL DATA:  Unresponsive, seizure, history of lung cancer EXAM: CT HEAD WITHOUT CONTRAST TECHNIQUE: Contiguous axial images were obtained from the base of the skull through the vertex without intravenous contrast. RADIATION DOSE REDUCTION: This exam was performed according to the departmental dose-optimization program which includes automated exposure control, adjustment of the mA and/or kV according to patient size and/or use of iterative reconstruction technique. COMPARISON:  None Available.  11/12/2022 FINDINGS: Brain: No evidence of acute infarction, hemorrhage, mass, mass effect, or midline shift. No hydrocephalus or extra-axial fluid collection. Gray-white differentiation is preserved. Unchanged size of the  ventricles. The basilar cisterns are patent. Vascular: No hyperdense vessel. Skull: Negative for fracture or focal lesion. Sinuses/Orbits: Mucosal thickening in the ethmoid air cells. No acute finding in the orbits. Other: The mastoid air cells are well aerated. IMPRESSION: No acute intracranial process. Electronically Signed   By: Wiliam Ke M.D.   On: 02/23/2023 02:12   DG Chest Port 1 View  Result Date: 02/23/2023 CLINICAL DATA:  Question of sepsis. History of lung cancer on chemo. Apnea at home. Seizure. EXAM: PORTABLE CHEST 1 VIEW COMPARISON:  12/08/2022 FINDINGS: Borderline heart size with mild pulmonary vascular congestion. Perihilar infiltration, greater on the left, may be due to pneumonia or edema. No pleural effusions. No pneumothorax. Mediastinal contours appear intact. Power port type central venous catheter with tip over the cavoatrial junction region. Old rib fractures. IMPRESSION: Borderline heart size with pulmonary vascular congestion and perihilar infiltration, greater on the left. Electronically Signed   By: Burman Nieves M.D.   On: 02/23/2023 01:06   NM PET Image Restag (PS) Skull Base To Thigh  Result Date:  02/05/2023 CLINICAL DATA:  Subsequent treatment strategy for small cell lung cancer. EXAM: NUCLEAR MEDICINE PET SKULL BASE TO THIGH TECHNIQUE: 10.5 mCi F-18 FDG was injected intravenously. Full-ring PET imaging was performed from the skull base to thigh after the radiotracer. CT data was obtained and used for attenuation correction and anatomic localization. Fasting blood glucose: 83 mg/dl COMPARISON:  Multiple exams, including PET-CT 11/04/2022 FINDINGS: Mediastinal blood pool activity: SUV max 1.6 Liver activity: SUV max NA NECK: No significant abnormal hypermetabolic activity in this region. Incidental CT findings: None. CHEST: Previous hypermetabolic AP window lymph node has essentially resolved without significant residual metabolic activity in this vicinity. The previous  left suprahilar activity has generally resolved although there is a left upper lobe nodule measuring 0.8 cm on image 44 of series 5 with maximum SUV of 3.3, potentially from malignancy. Distal esophageal activity is likely physiologic, maximum SUV 3.4. Incidental CT findings: Airway thickening is present, suggesting bronchitis or reactive airways disease. Emphysema. Small type 1 hiatal hernia. Right Port-A-Cath tip: SVC. ABDOMEN/PELVIS: The right adrenal nodule has resolved with no persistent hypermetabolic activity. Incidental CT findings: Photopenic left renal cysts. Mild aortoiliac atherosclerosis. SKELETON: Prior widespread osseous metastatic lesions are now sclerotic and demonstrate no significant abnormal metabolic activity above baseline indicating successful treatment. However, there is some mildly accentuated diffuse marrow activity in the skeleton, probably from granulocyte stimulation. Incidental CT findings: Fused sacroiliac joints. Prominent thoracic kyphosis. New lucency along the inferior endplate of L1, probably a Schmorl's node although potentially associated with the surrounding treated metastatic lesion, but without hypermetabolic activity. IMPRESSION: 1. In general the pattern is of marked improvement, with resolution of prior thoracic adenopathy, as well as resolution of abnormal activity in the previous widespread osseous metastatic disease which is now sclerotic. 2. There is a 0.8 cm left upper lobe nodule with maximum SUV of 3.3, potentially from malignancy. 3. The prior right adrenal nodule has resolved. 4. Mildly accentuated diffuse marrow activity in the skeleton, probably from granulocyte stimulation. 5. New lucency along the inferior endplate of L1, probably a Schmorl's node although potentially associated with the surrounding treated metastatic lesion, but without hypermetabolic activity. 6. Airway thickening is present, suggesting bronchitis or reactive airways disease. Emphysema. 7.  Small type 1 hiatal hernia. Distal esophageal activity is likely physiologic. 8. Aortic atherosclerosis. Aortic Atherosclerosis (ICD10-I70.0) and Emphysema (ICD10-J43.9). Electronically Signed   By: Gaylyn Rong M.D.   On: 02/05/2023 11:45    PERFORMANCE STATUS (ECOG) : 2 - Symptomatic, <50% confined to bed  Review of Systems Unless otherwise noted, a complete review of systems is negative.  Physical Exam General: NAD Cardiovascular: regular rate and rhythm Pulmonary: Clear anterior/posterior fields, on O2 Abdomen: soft, nontender, + bowel sounds GU: no suprapubic tenderness Extremities: Bilateral lower extremity edema right greater than left Skin: no rashes Neurological: Weakness but otherwise nonfocal  IMPRESSION/PLAN: Stage IV non-small cell lung cancer -prognosis poor.  Patient has extensive disease.  On maintenance Tecentriq.  Patient sees Dr. Orlie Dakin next week.  Diarrhea-May be secondary to recent antibiotics.  However, patient reports that diarrhea has essentially resolved today.  Will monitor.  Can send stool studies for GI panel and check C. difficile if diarrhea recurs.  Proceed with IV fluids and supportive care.  Dehydration -proceed with IV fluids and replete IV mag   RTC next week MD.  Case and plan discussed with Dr. Orlie Dakin  Patient expressed understanding and was in agreement with this plan. He also understands that He can call clinic at  any time with any questions, concerns, or complaints.   Thank you for allowing me to participate in the care of this very pleasant patient.   Time Total: 15 minutes  Visit consisted of counseling and education dealing with the complex and emotionally intense issues of symptom management in the setting of serious illness.Greater than 50%  of this time was spent counseling and coordinating care related to the above assessment and plan.  Signed by: Laurette Schimke, PhD, NP-C

## 2023-03-05 ENCOUNTER — Inpatient Hospital Stay: Payer: Medicare Other

## 2023-03-05 ENCOUNTER — Inpatient Hospital Stay (HOSPITAL_BASED_OUTPATIENT_CLINIC_OR_DEPARTMENT_OTHER): Payer: Medicare Other | Admitting: Oncology

## 2023-03-05 ENCOUNTER — Encounter: Payer: Self-pay | Admitting: Oncology

## 2023-03-05 DIAGNOSIS — R918 Other nonspecific abnormal finding of lung field: Secondary | ICD-10-CM

## 2023-03-05 DIAGNOSIS — C3490 Malignant neoplasm of unspecified part of unspecified bronchus or lung: Secondary | ICD-10-CM

## 2023-03-05 DIAGNOSIS — Z5112 Encounter for antineoplastic immunotherapy: Secondary | ICD-10-CM | POA: Diagnosis not present

## 2023-03-05 LAB — CBC WITH DIFFERENTIAL/PLATELET
Abs Immature Granulocytes: 0.01 10*3/uL (ref 0.00–0.07)
Basophils Absolute: 0 10*3/uL (ref 0.0–0.1)
Basophils Relative: 1 %
Eosinophils Absolute: 0.4 10*3/uL (ref 0.0–0.5)
Eosinophils Relative: 7 %
HCT: 29.5 % — ABNORMAL LOW (ref 39.0–52.0)
Hemoglobin: 9.8 g/dL — ABNORMAL LOW (ref 13.0–17.0)
Immature Granulocytes: 0 %
Lymphocytes Relative: 16 %
Lymphs Abs: 0.9 10*3/uL (ref 0.7–4.0)
MCH: 30.1 pg (ref 26.0–34.0)
MCHC: 33.2 g/dL (ref 30.0–36.0)
MCV: 90.5 fL (ref 80.0–100.0)
Monocytes Absolute: 0.5 10*3/uL (ref 0.1–1.0)
Monocytes Relative: 8 %
Neutro Abs: 3.9 10*3/uL (ref 1.7–7.7)
Neutrophils Relative %: 68 %
Platelets: 263 10*3/uL (ref 150–400)
RBC: 3.26 MIL/uL — ABNORMAL LOW (ref 4.22–5.81)
RDW: 13.9 % (ref 11.5–15.5)
WBC: 5.6 10*3/uL (ref 4.0–10.5)
nRBC: 0 % (ref 0.0–0.2)

## 2023-03-05 LAB — COMPREHENSIVE METABOLIC PANEL
ALT: 17 U/L (ref 0–44)
AST: 19 U/L (ref 15–41)
Albumin: 3.6 g/dL (ref 3.5–5.0)
Alkaline Phosphatase: 70 U/L (ref 38–126)
Anion gap: 9 (ref 5–15)
BUN: 8 mg/dL (ref 6–20)
CO2: 27 mmol/L (ref 22–32)
Calcium: 8.7 mg/dL — ABNORMAL LOW (ref 8.9–10.3)
Chloride: 97 mmol/L — ABNORMAL LOW (ref 98–111)
Creatinine, Ser: 0.49 mg/dL — ABNORMAL LOW (ref 0.61–1.24)
GFR, Estimated: >60 mL/min (ref >60)
Glucose, Bld: 87 mg/dL (ref 70–99)
Potassium: 4.2 mmol/L (ref 3.5–5.1)
Sodium: 133 mmol/L — ABNORMAL LOW (ref 135–145)
Total Bilirubin: 0.6 mg/dL (ref 0.3–1.2)
Total Protein: 6.1 g/dL — ABNORMAL LOW (ref 6.5–8.1)

## 2023-03-05 LAB — SAMPLE TO BLOOD BANK

## 2023-03-05 LAB — TSH: TSH: 1.664 u[IU]/mL (ref 0.350–4.500)

## 2023-03-05 MED ORDER — SODIUM CHLORIDE 0.9 % IV SOLN
Freq: Once | INTRAVENOUS | Status: AC
  Filled 2023-03-05: qty 250

## 2023-03-05 MED ORDER — HEPARIN SOD (PORK) LOCK FLUSH 100 UNIT/ML IV SOLN
500.0000 [IU] | Freq: Once | INTRAVENOUS | Status: AC | PRN
  Administered 2023-03-05: 500 [IU]
  Filled 2023-03-05: qty 5

## 2023-03-05 MED ORDER — SODIUM CHLORIDE 0.9 % IV SOLN
1200.0000 mg | Freq: Once | INTRAVENOUS | Status: AC
  Administered 2023-03-05: 1200 mg via INTRAVENOUS
  Filled 2023-03-05: qty 20

## 2023-03-05 NOTE — Patient Instructions (Signed)
[facility]  Discharge Instructions: Thank you for choosing [facility] to provide your oncology and hematology care.  If you have a lab appointment with the Cancer Center, please go directly to the Cancer Center and check in at the registration area.  Wear comfortable clothing and clothing appropriate for easy access to any Portacath or PICC line.   We strive to give you quality time with your provider. You may need to reschedule your appointment if you arrive late (15 or more minutes).  Arriving late affects you and other patients whose appointments are after yours.  Also, if you miss three or more appointments without notifying the office, you may be dismissed from the clinic at the provider's discretion.      For prescription refill requests, have your pharmacy contact our office and allow 72 hours for refills to be completed.     To help prevent nausea and vomiting after your treatment, we encourage you to take your nausea medication as directed.  BELOW ARE SYMPTOMS THAT SHOULD BE REPORTED IMMEDIATELY: *FEVER GREATER THAN 100.4 F (38 C) OR HIGHER *CHILLS OR SWEATING *NAUSEA AND VOMITING THAT IS NOT CONTROLLED WITH YOUR NAUSEA MEDICATION *UNUSUAL SHORTNESS OF BREATH *UNUSUAL BRUISING OR BLEEDING *URINARY PROBLEMS (pain or burning when urinating, or frequent urination) *BOWEL PROBLEMS (unusual diarrhea, constipation, pain near the anus) TENDERNESS IN MOUTH AND THROAT WITH OR WITHOUT PRESENCE OF ULCERS (sore throat, sores in mouth, or a toothache) UNUSUAL RASH, SWELLING OR PAIN  UNUSUAL VAGINAL DISCHARGE OR ITCHING   Items with * indicate a potential emergency and should be followed up as soon as possible or go to the Emergency Department if any problems should occur.  Please show the CHEMOTHERAPY ALERT CARD or IMMUNOTHERAPY ALERT CARD at check-in to the Emergency Department and triage nurse.  Should you have questions after your visit  or need to cancel or reschedule your appointment, please contact [facility]  [phone] and follow the prompts.  Office hours are 8:00 a.m. to 4:30 p.m. Monday - Friday. Please note that voicemails left after 4:00 p.m. may not be returned until the following business day.  We are closed weekends and major holidays. You have access to a nurse at all times for urgent questions. Please call the main number to the clinic [phone] and follow the prompts.  For any non-urgent questions, you may also contact your provider using MyChart. We now offer e-Visits for anyone 18 and older to request care online for non-urgent symptoms. For details visit [email or web address].   Also download the MyChart app! Go to the app store, search "MyChart", open the app, select [facility], and log in with your MyChart username and password.    

## 2023-03-05 NOTE — Progress Notes (Signed)
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  INTERVAL HISTORY: Patient returns to clinic today for further evaluation and consideration of maintenance Tecentriq.  Treatment was delayed 1 week secondary to patient admission to the hospital for seizure-like activity.  His symptoms have now resolved and he feels back to his baseline.  He has chronic weakness and fatigue.  He continues to have chronic shortness of breath.  He does not complain of pain today.  He has no neurologic complaints.  He denies any recent fevers.  He has a good appetite and denies weight loss.  He has no chest pain or hemoptysis.  He has no nausea, vomiting, constipation, or diarrhea.  He has no urinary complaints.  Patient offers no further specific complaints today.  REVIEW OF SYSTEMS:   Review of Systems  Constitutional:  Positive for malaise/fatigue. Negative for fever and weight loss.  Respiratory:  Positive for shortness of breath. Negative for cough and hemoptysis.   Cardiovascular: Negative.  Negative for chest pain and leg swelling.  Gastrointestinal: Negative.  Negative for abdominal pain and nausea.  Genitourinary: Negative.  Negative for dysuria.  Musculoskeletal: Negative.  Negative for back pain and joint pain.  Skin: Negative.  Negative for rash.  Neurological:  Positive for seizures and weakness. Negative for dizziness, focal weakness and headaches.  Psychiatric/Behavioral: Negative.  The patient is not nervous/anxious.     As per HPI. Otherwise, a complete review of systems is negative.  PAST MEDICAL HISTORY: Past Medical History:  Diagnosis Date   Anxiety    Arthritis    Emphysema of lung (HCC)    HTN (hypertension)     Hyperlipemia    Schizoaffective disorder, bipolar type (HCC)    Substance abuse (HCC)     PAST SURGICAL HISTORY: Past Surgical History:  Procedure Laterality Date   FINGER SURGERY     IR IMAGING GUIDED PORT INSERTION  11/04/2022   VIDEO BRONCHOSCOPY WITH ENDOBRONCHIAL ULTRASOUND Left 10/11/2022   Procedure: VIDEO BRONCHOSCOPY WITH ENDOBRONCHIAL ULTRASOUND;  Surgeon: Raechel Chute, MD;  Location: [facility] ORS;  Service: Pulmonary;  Laterality: Left;    FAMILY HISTORY: Family History  Problem Relation Age of Onset   Stroke Father    Seizures Father    Stroke Sister    Cancer Maternal Grandmother     ADVANCED DIRECTIVES (Y/N):  N  HEALTH MAINTENANCE: Social History   Tobacco Use   Smoking status: Former    Packs/day: 0.50    Years: 40.00    Additional pack years: 0.00    Total pack years: 20.00    Types: Cigarettes    Quit date: 10/15/2022    Years since quitting: 0.3   Smokeless tobacco: Former    Quit date: 10/15/2022  Substance Use Topics   Alcohol use: No   Drug use: No     Colonoscopy:  PAP:  Bone density:  Lipid panel:  Allergies  Allergen Reactions   Fluphenazine     Other Reaction(s): Unknown    Current Outpatient Medications  Medication Sig Dispense Refill   albuterol (VENTOLIN HFA) 108 (90 Base) MCG/ACT inhaler Inhale 2 puffs into the lungs every 6 (six) hours as needed for wheezing or shortness of breath.     benzonatate (TESSALON) 100 MG  capsule Take 1 capsule (100 mg total) by mouth 4 (four) times daily as needed for cough. 20 capsule 0   benztropine (COGENTIN) 0.5 MG tablet Take 0.5 mg by mouth 2 (two) times daily.     dextromethorphan-guaiFENesin (MUCINEX DM) 30-600 MG 12hr tablet Take 1 tablet by mouth 2 (two) times daily for 10 days. 20 tablet 0   fluPHENAZine (PROLIXIN) 5 MG tablet Take 5 mg by mouth daily.     guaiFENesin (MUCINEX) 600 MG 12 hr tablet Take 1 tablet (600 mg total) by mouth 2 (two) times daily as needed for cough. 30 tablet 0    ipratropium-albuterol (DUONEB) 0.5-2.5 (3) MG/3ML SOLN Take 3 mLs by nebulization every 4 (four) hours as needed. 360 mL 0   levETIRAcetam (KEPPRA) 500 MG tablet Take 1 tablet (500 mg total) by mouth 2 (two) times daily. 60 tablet 3   lidocaine-prilocaine (EMLA) cream Apply to affected area once 30 g 3   lisinopril (ZESTRIL) 10 MG tablet Take 1 tablet (10 mg total) by mouth daily. 30 tablet 3   meloxicam (MOBIC) 15 MG tablet Take 15 mg by mouth daily.     menthol-cetylpyridinium (CEPACOL) 3 MG lozenge Take 1 lozenge (3 mg total) by mouth as needed for sore throat. 30 tablet 0   Mouthwashes (MOUTH RINSE) LIQD solution 15 mLs by Mouth Rinse route as needed (oral care). 118 mL 0   Multiple Vitamin (MULTI-VITAMIN) tablet Take 1 tablet by mouth daily.     potassium chloride SA (KLOR-CON M) 20 MEQ tablet Take 1 tablet (20 mEq total) by mouth 2 (two) times daily. 60 tablet 0   propranolol (INDERAL) 10 MG tablet Take 0.5 tablets (5 mg total) by mouth 2 (two) times daily. 60 tablet 0   simvastatin (ZOCOR) 20 MG tablet Take 20 mg by mouth at bedtime.     sodium chloride 1 g tablet Take 1 tablet (1 g total) by mouth 3 (three) times daily. 30 tablet 2   Vitamin E 400 units TABS Take 400 Units by mouth daily.     bisacodyl (DULCOLAX) 5 MG EC tablet Take 1 tablet (5 mg total) by mouth daily as needed for moderate constipation. (Patient not taking: Reported on 02/28/2023) 30 tablet 0   docusate sodium (COLACE) 100 MG capsule Take 1 capsule (100 mg total) by mouth 2 (two) times daily as needed for mild constipation. (Patient not taking: Reported on 02/28/2023) 10 capsule 0   HYDROcodone-acetaminophen (NORCO/VICODIN) 5-325 MG tablet Take 1-2 tablets by mouth every 6 (six) hours as needed for moderate pain. (Patient not taking: Reported on 02/23/2023) 30 tablet 0   No current facility-administered medications for this visit.    OBJECTIVE: Vitals:   03/05/23 1026  BP: 135/83  Pulse: 72  Temp: 98.9 F (37.2 C)   SpO2: 100%     Body mass index is 28.32 kg/m.    ECOG FS:1 - Symptomatic but completely ambulatory  General: Well-developed, well-nourished, no acute distress.  Sitting in a wheelchair. Eyes: Pink conjunctiva, anicteric sclera. HEENT: Normocephalic, moist mucous membranes. Lungs: No audible wheezing or coughing. Heart: Regular rate and rhythm. Abdomen: Soft, nontender, no obvious distention. Musculoskeletal: No edema, cyanosis, or clubbing. Neuro: Alert, answering all questions appropriately. Cranial nerves grossly intact. Skin: No rashes or petechiae noted. Psych: Normal affect.  LAB RESULTS:  Lab Results  Component Value Date   NA 133 (L) 03/05/2023   K 4.2 03/05/2023   CL 97 (L) 03/05/2023   CO2 27 03/05/2023  GLUCOSE 87 03/05/2023   BUN 8 03/05/2023   CREATININE 0.49 (L) 03/05/2023   CALCIUM 8.7 (L) 03/05/2023   PROT 6.1 (L) 03/05/2023   ALBUMIN 3.6 03/05/2023   AST 19 03/05/2023   ALT 17 03/05/2023   ALKPHOS 70 03/05/2023   BILITOT 0.6 03/05/2023   GFRNONAA >60 03/05/2023    Lab Results  Component Value Date   WBC 5.6 03/05/2023   NEUTROABS 3.9 03/05/2023   HGB 9.8 (L) 03/05/2023   HCT 29.5 (L) 03/05/2023   MCV 90.5 03/05/2023   PLT 263 03/05/2023     STUDIES: EEG adult  Result Date: 2023-03-21 Jefferson Fuel, MD     03-21-2023  3:47 PM Routine EEG Report Caileb Redhead is a 62 y.o. male with a history of seizure who is undergoing an EEG to evaluate for seizures. Report: This EEG was acquired with electrodes placed according to the International 10-20 electrode system (including Fp1, Fp2, F3, F4, C3, C4, P3, P4, O1, O2, T3, T4, T5, T6, A1, A2, Fz, Cz, Pz). The following electrodes were missing or displaced: none. The occipital dominant rhythm was 7-8 Hz with intermittent bifrontal slowing. This activity is reactive to stimulation. Drowsiness was manifested by background fragmentation; deeper stages of sleep were identified by K complexes and sleep spindles. There  were no interictal epileptiform discharges. There were no electrographic seizures identified. There was no abnormal response to photic stimulation or hyperventilation. Impression and clinical correlation: This EEG was obtained while awake and asleep and is abnormal due to mild diffuse slowing and intermittent superimposed bifrontal slowing indicative of global cerebral dysfunction. Epileptiform abnormalities were not seen during this recording. Bing Neighbors, MD [facility] [phone] If 7pm- 7am, please page neurology on call as listed in AMION.   CT VENOGRAM HEAD  Result Date: 02/23/2023 CLINICAL DATA:  Dural venous sinus thrombosis suspected. Question metastatic disease. Unresponsive. Seizure. EXAM: CT VENOGRAM HEAD TECHNIQUE: Venographic phase images of the brain were obtained following the administration of intravenous contrast. Multiplanar reformats and maximum intensity projections were generated. RADIATION DOSE REDUCTION: This exam was performed according to the departmental dose-optimization program which includes automated exposure control, adjustment of the mA and/or kV according to patient size and/or use of iterative reconstruction technique. CONTRAST:  75mL OMNIPAQUE IOHEXOL 350 MG/ML SOLN COMPARISON:  CT head and CTA head and neck 02/23/2023. FINDINGS: The dural sinuses patent. Straight sinus deep cerebral veins intact. Transverse sinuses codominant. Cortical veins are unremarkable. No pathologic enhancement present in the brain. IMPRESSION: No evidence of dural venous sinus thrombosis. Normal postcontrast images of the brain. No evidence for metastatic disease to the brain. Electronically Signed   By: Marin Roberts M.D.   On: 02/23/2023 14:16   CT ANGIO HEAD NECK W WO CM  Result Date: 02/23/2023 CLINICAL DATA:  Stroke/TIA, determine embolic source. Unresponsive, seizure, history of lung cancer. EXAM: CT ANGIOGRAPHY HEAD AND NECK WITH AND WITHOUT CONTRAST TECHNIQUE:  Multidetector CT imaging of the head and neck was performed using the standard protocol during bolus administration of intravenous contrast. Multiplanar CT image reconstructions and MIPs were obtained to evaluate the vascular anatomy. Carotid stenosis measurements (when applicable) are obtained utilizing NASCET criteria, using the distal internal carotid diameter as the denominator. RADIATION DOSE REDUCTION: This exam was performed according to the departmental dose-optimization program which includes automated exposure control, adjustment of the mA and/or kV according to patient size and/or use of iterative reconstruction technique. CONTRAST:  75mL OMNIPAQUE IOHEXOL 350 MG/ML SOLN COMPARISON:  None Available. FINDINGS:  CT HEAD FINDINGS Brain: No acute infarct, hemorrhage, or mass lesion is present. No significant white matter lesions are present. The ventricles are of normal size. No significant extraaxial fluid collection is present. The brainstem and cerebellum are within normal limits. Midline structures are within normal limits. Vascular: No hyperdense vessel or unexpected calcification. Skull: Calvarium is intact. No focal lytic or blastic lesions are present. No significant extracranial soft tissue lesion is present. Sinuses/Orbits: The paranasal sinuses and mastoid air cells are clear. The globes and orbits are within normal limits. Review of the MIP images confirms the above findings CTA NECK FINDINGS Aortic arch: 3 vessel arch configuration is present. Great vessel origins are within normal limits. No significant atherosclerotic disease or stenosis is present. Right carotid system: The right common carotid artery is within normal limits. The bifurcation is unremarkable. The cervical right ICA is normal. Left carotid system: The left common carotid artery within normal limits. Bifurcation is unremarkable. Cervical left ICA is normal. Vertebral arteries: The vertebral arteries are codominant. Both vertebral  arteries originate from the arteries without significant stenosis. Skeleton: Mild rightward curvature is present in the cervical spine. Sclerotic lesions are present the C5 vertebral body, on the right at C6 vertebral body, superiorly T2 vertebral body on left T3 vertebral body. More ill-defined diffuse sclerotic lesion is present at T5 vertebral body. No pathologic fractures are present. Other neck: Soft tissues the neck are otherwise unremarkable. Salivary glands are within normal limits. Thyroid is normal. No significant adenopathy is present. No focal mucosal or submucosal lesions are present. Upper chest: Centrilobular emphysematous changes are present. No focal nodule or mass lesion is present. Review of the MIP images confirms the above findings CTA HEAD FINDINGS Anterior circulation: Minimal atherosclerotic calcifications are present within the cavernous internal carotid arteries bilaterally. Significant stenosis is present through the ICA terminus. The A1 and M1 segments are normal. The anterior communicating artery is patent. MCA bifurcations are within normal limits. There is some attenuation of distal ACA and MCA branch vessels without a significant proximal stenosis or occlusion. Posterior circulation: Origins are visualized and normal. The vertebrobasilar junction and basilar artery are normal. The superior cerebellar arteries are patent. Left posterior cerebral artery originates basilar tip. Right posterior communicating artery and right P1 segment contribute to the right posterior communicating artery. The PCA branch vessels are normal bilaterally. Venous sinuses: The dural sinuses are patent. The straight sinus and deep cerebral veins are intact. Cortical veins are within normal limits. No significant vascular malformation is evident. Anatomic variants: None. Review of the MIP images confirms the above findings IMPRESSION: 1. No large vessel occlusion. 2. Minimal atherosclerotic calcifications  within the cavernous internal carotid arteries bilaterally without significant stenosis. 3. Mild distal small vessel disease without a significant proximal stenosis, aneurysm, or branch vessel occlusion within the Circle of Willis. 4. Normal CTA of the neck. 5. Multiple sclerotic lesions in the cervical and thoracic spine consistent with metastatic disease. No pathologic fractures are present. 6.  Emphysema (ICD10-J43.9). Electronically Signed   By: Marin Roberts M.D.   On: 02/23/2023 14:14   CT Angio Chest PE W and/or Wo Contrast  Result Date: 02/23/2023 CLINICAL DATA:  Pulmonary embolism (PE) suspected, high prob History of lung cancer on chemotherapy. EXAM: CT ANGIOGRAPHY CHEST WITH CONTRAST TECHNIQUE: Multidetector CT imaging of the chest was performed using the standard protocol during bolus administration of intravenous contrast. Multiplanar CT image reconstructions and MIPs were obtained to evaluate the vascular anatomy. RADIATION DOSE REDUCTION: This exam  was performed according to the departmental dose-optimization program which includes automated exposure control, adjustment of the mA and/or kV according to patient size and/or use of iterative reconstruction technique. CONTRAST:  OMNIPAQUE IOHEXOL 350 MG/ML SOLN COMPARISON:  Chest radiograph earlier today. PET CT 02/02/2013 FINDINGS: Cardiovascular: There are no filling defects within the pulmonary arteries to suggest pulmonary embolus. Aortic atherosclerosis and tortuosity, no dissection or acute aortic findings. The heart is normal in size. No pericardial effusion. Right chest port in place. Mediastinum/Nodes: Scattered mediastinal lymph nodes are not enlarged by size criteria. There is soft tissue density at the left hilum which may be treatment related change. No right hilar adenopathy. Small hiatal hernia with wall thickening of the distal esophagus. Lungs/Pleura: Extensive patchy airspace opacity throughout the dependent left upper  lobe and central left lower lobe. Left upper lobe airspace disease obscures the previous left upper lobe pulmonary nodule. Lesser airspace disease within the dependent right lower lobe. Large amount of retained debris within the distal trachea tracking into both mainstem bronchi. Emphysema with bronchial thickening. Chronic bandlike atelectasis in the right lower lobe. No significant pleural effusion. Upper Abdomen: Punctate gallstone. Left renal cysts. No further imaging follow-up is needed. No acute upper abdominal findings. Musculoskeletal: Exaggerated thoracic kyphosis. Diffuse sclerotic metastatic disease, stable from prior PET. Large Schmorl's node involving inferior endplate of L1 is stable. There is a mild T12 compression deformity that may be new. Review of the MIP images confirms the above findings. IMPRESSION: 1. No pulmonary embolus. 2. Extensive patchy airspace opacity throughout the dependent left upper lobe and both lower lobe consistent with pneumonia. Large amount of retained debris within the distal trachea tracking into both mainstem bronchi consistent highly suggestive of aspiration. 3. Left upper lobe airspace disease obscures the previous left upper lobe nodule. 4. Mild T12 compression deformity may be new from prior PET. 5. Emphysema with chronic bronchial thickening. Aortic Atherosclerosis (ICD10-I70.0) and Emphysema (ICD10-J43.9). Electronically Signed   By: Narda Rutherford M.D.   On: 02/23/2023 02:28   CT HEAD WO CONTRAST ( )  Result Date: 02/23/2023 CLINICAL DATA:  Unresponsive, seizure, history of lung cancer EXAM: CT HEAD WITHOUT CONTRAST TECHNIQUE: Contiguous axial images were obtained from the base of the skull through the vertex without intravenous contrast. RADIATION DOSE REDUCTION: This exam was performed according to the departmental dose-optimization program which includes automated exposure control, adjustment of the mA and/or kV according to patient size and/or use of  iterative reconstruction technique. COMPARISON:  None Available.  11/12/2022 FINDINGS: Brain: No evidence of acute infarction, hemorrhage, mass, mass effect, or midline shift. No hydrocephalus or extra-axial fluid collection. Gray-white differentiation is preserved. Unchanged size of the ventricles. The basilar cisterns are patent. Vascular: No hyperdense vessel. Skull: Negative for fracture or focal lesion. Sinuses/Orbits: Mucosal thickening in the ethmoid air cells. No acute finding in the orbits. Other: The mastoid air cells are well aerated. IMPRESSION: No acute intracranial process. Electronically Signed   By: Wiliam Ke M.D.   On: 02/23/2023 02:12   DG Chest Port 1 View  Result Date: 02/23/2023 CLINICAL DATA:  Question of sepsis. History of lung cancer on chemo. Apnea at home. Seizure. EXAM: PORTABLE CHEST 1 VIEW COMPARISON:  12/08/2022 FINDINGS: Borderline heart size with mild pulmonary vascular congestion. Perihilar infiltration, greater on the left, may be due to pneumonia or edema. No pleural effusions. No pneumothorax. Mediastinal contours appear intact. Power port type central venous catheter with tip over the cavoatrial junction region. Old rib fractures. IMPRESSION: Borderline  heart size with pulmonary vascular congestion and perihilar infiltration, greater on the left. Electronically Signed   By: Burman Nieves M.D.   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  Biopsy confirmed small cell lung carcinoma.  Patient could not tolerate MRI, but CT of the brain on October 11, 2022 is negative.  PET scan results from November 04, 2022 reviewed independently with widespread bony metastasis.  Patient has completed palliative XRT given his symptoms and encasement of pulmonary vessels.  Patient completed his chemotherapy on January 17, 2023 and now will continue with maintenance Tecentriq.  Repeat PET scan results from Feb 03, 2023 reviewed independently with marked improvement of patient's disease burden.  Proceed with maintenance Tecentriq today.  Return to clinic in 3 weeks for further evaluation and continuation of treatment.  Will reimage in August 2024.   Shortness of breath/cough: Chronic and unchanged.  Appreciate pulmonary input.  Patient is now on oxygen.   Bone lesions: Continue Zometa with odd-numbered cycles. Hyponatremia: Chronic and unchanged.  Patient's sodium level is 133.Marland Kitchen  Continue salt tablets as prescribed. Hypokalemia: Resolved.  Continue oral potassium supplementation. Pain: Patient does not have this today.  Appreciate palliative care input. Leukocytosis: Resolved. Anemia: Patient's hemoglobin is 9.8 today.  Monitor. Seizures: CT brain negative for metastatic disease.  Continue Keppra as prescribed.   Patient expressed understanding and was in agreement with this plan. He also understands that He can call clinic at any time with any questions, concerns, or complaints.    Cancer Staging  Metastatic lung cancer (metastasis from lung to other site) [facility]) Staging form: Lung, AJCC 8th Edition - Clinical: Stage IVA (cT4, cN2, cM1a) - Signed by Jeralyn Ruths, MD on 10/29/2022   Jeralyn Ruths, MD   03/05/2023 1:50 PM

## 2023-03-05 NOTE — Progress Notes (Signed)
Patient is having some pain and irritation in his throat ever since his seizure that he had.

## 2023-03-06 LAB — T4: T4, Total: 8.1 ug/dL (ref 4.5–12.0)

## 2023-03-13 ENCOUNTER — Emergency Department

## 2023-03-13 ENCOUNTER — Other Ambulatory Visit: Payer: Self-pay

## 2023-03-13 ENCOUNTER — Emergency Department
Admission: EM | Admit: 2023-03-13 | Discharge: 2023-03-14 | Disposition: A | Discharge status: Home / Self Care | Attending: Emergency Medicine | Admitting: Emergency Medicine

## 2023-03-13 DIAGNOSIS — Z85118 Personal history of other malignant neoplasm of bronchus and lung: Secondary | ICD-10-CM | POA: Diagnosis not present

## 2023-03-13 DIAGNOSIS — E871 Hypo-osmolality and hyponatremia: Secondary | ICD-10-CM | POA: Diagnosis not present

## 2023-03-13 DIAGNOSIS — Z1152 Encounter for screening for COVID-19: Secondary | ICD-10-CM | POA: Insufficient documentation

## 2023-03-13 DIAGNOSIS — I10 Essential (primary) hypertension: Secondary | ICD-10-CM | POA: Diagnosis not present

## 2023-03-13 DIAGNOSIS — R569 Unspecified convulsions: Secondary | ICD-10-CM | POA: Insufficient documentation

## 2023-03-13 LAB — CBC WITH DIFFERENTIAL/PLATELET
Abs Immature Granulocytes: 0.02 10*3/uL (ref 0.00–0.07)
Basophils Absolute: 0 10*3/uL (ref 0.0–0.1)
Basophils Relative: 1 %
Eosinophils Absolute: 0.4 10*3/uL (ref 0.0–0.5)
Eosinophils Relative: 6 %
HCT: 32.6 % — ABNORMAL LOW (ref 39.0–52.0)
Hemoglobin: 10.7 g/dL — ABNORMAL LOW (ref 13.0–17.0)
Immature Granulocytes: 0 %
Lymphocytes Relative: 24 %
Lymphs Abs: 1.5 10*3/uL (ref 0.7–4.0)
MCH: 30.1 pg (ref 26.0–34.0)
MCHC: 32.8 g/dL (ref 30.0–36.0)
MCV: 91.6 fL (ref 80.0–100.0)
Monocytes Absolute: 0.5 10*3/uL (ref 0.1–1.0)
Monocytes Relative: 9 %
Neutro Abs: 3.7 10*3/uL (ref 1.7–7.7)
Neutrophils Relative %: 60 %
Platelets: 265 10*3/uL (ref 150–400)
RBC: 3.56 MIL/uL — ABNORMAL LOW (ref 4.22–5.81)
RDW: 13 % (ref 11.5–15.5)
WBC: 6.2 10*3/uL (ref 4.0–10.5)
nRBC: 0 % (ref 0.0–0.2)

## 2023-03-13 LAB — COMPREHENSIVE METABOLIC PANEL
ALT: 12 U/L (ref 0–44)
AST: 16 U/L (ref 15–41)
Albumin: 3.9 g/dL (ref 3.5–5.0)
Alkaline Phosphatase: 76 U/L (ref 38–126)
Anion gap: 8 (ref 5–15)
BUN: 7 mg/dL (ref 6–20)
CO2: 28 mmol/L (ref 22–32)
Calcium: 8.3 mg/dL — ABNORMAL LOW (ref 8.9–10.3)
Chloride: 91 mmol/L — ABNORMAL LOW (ref 98–111)
Creatinine, Ser: 0.57 mg/dL — ABNORMAL LOW (ref 0.61–1.24)
GFR, Estimated: >60 mL/min (ref >60)
Glucose, Bld: 129 mg/dL — ABNORMAL HIGH (ref 70–99)
Potassium: 3.8 mmol/L (ref 3.5–5.1)
Sodium: 127 mmol/L — ABNORMAL LOW (ref 135–145)
Total Bilirubin: 0.5 mg/dL (ref 0.3–1.2)
Total Protein: 6.6 g/dL (ref 6.5–8.1)

## 2023-03-13 LAB — MAGNESIUM: Magnesium: 1.3 mg/dL — ABNORMAL LOW (ref 1.7–2.4)

## 2023-03-13 LAB — LACTIC ACID, PLASMA: Lactic Acid, Venous: 1.1 mmol/L (ref 0.5–1.9)

## 2023-03-13 MED ORDER — LEVETIRACETAM IN NACL 1500 MG/100ML IV SOLN
1500.0000 mg | Freq: Once | INTRAVENOUS | Status: AC
  Administered 2023-03-13: 1500 mg via INTRAVENOUS
  Filled 2023-03-13: qty 100

## 2023-03-13 MED ORDER — LORAZEPAM 2 MG/ML IJ SOLN
2.0000 mg | Freq: Once | INTRAMUSCULAR | Status: AC
  Filled 2023-03-13: qty 1

## 2023-03-13 MED ORDER — LEVETIRACETAM IN NACL 1000 MG/100ML IV SOLN
1000.0000 mg | Freq: Once | INTRAVENOUS | Status: AC
  Administered 2023-03-13: 1000 mg via INTRAVENOUS

## 2023-03-13 MED ORDER — LACTATED RINGERS IV BOLUS
1000.0000 mL | Freq: Once | INTRAVENOUS | Status: AC
  Administered 2023-03-13: 1000 mL via INTRAVENOUS

## 2023-03-13 MED ORDER — SODIUM CHLORIDE 0.9 % IV SOLN: 4000.0000 mg | Freq: Once | INTRAVENOUS | Status: DC

## 2023-03-13 MED ORDER — LORAZEPAM 2 MG/ML IJ SOLN
2.0000 mg | Freq: Once | INTRAMUSCULAR | Status: AC
  Administered 2023-03-13: 2 mg via INTRAVENOUS

## 2023-03-13 MED ORDER — SODIUM CHLORIDE 0.9 % IV SOLN: 2000.0000 mg | Freq: Once | INTRAVENOUS | Status: DC

## 2023-03-13 MED ORDER — LEVETIRACETAM IN NACL 1000 MG/100ML IV SOLN
INTRAVENOUS | Status: AC
  Administered 2023-03-13: 1000 mg via INTRAVENOUS
  Filled 2023-03-13: qty 100

## 2023-03-13 MED ORDER — LORAZEPAM 2 MG/ML IJ SOLN
INTRAMUSCULAR | Status: AC
  Administered 2023-03-13: 2 mg via INTRAVENOUS
  Filled 2023-03-13: qty 1

## 2023-03-13 MED ORDER — MAGNESIUM SULFATE 2 GM/50ML IV SOLN
2.0000 g | Freq: Once | INTRAVENOUS | Status: AC
  Administered 2023-03-13: 2 g via INTRAVENOUS
  Filled 2023-03-13: qty 50

## 2023-03-13 MED ORDER — MIDAZOLAM HCL 2 MG/2ML IJ SOLN
4.0000 mg | Freq: Once | INTRAMUSCULAR | Status: AC
  Administered 2023-03-13: 4 mg via INTRAVENOUS
  Filled 2023-03-13: qty 4

## 2023-03-13 NOTE — ED Provider Notes (Signed)
[facility] Provider Note    Event Date/Time   First MD Initiated Contact with Patient 03/13/23 2348     (approximate)   History   Seizures   HPI  Trevor Montgomery is a 62 y.o. male past medical history of metastatic lung cancer, hypertension, seizures who presents because of altered mental status.  Per patient's sister he was cooking chili and was acting normally then walked into another room.  They then found him slumped over minimally responsive.  When EMS arrived he had a right gaze deviation and had some seizure-like activity in his upper extremities to receive 2.5 mg of Versed.  Patient sister who is his POA is at bedside said that he is DNR and DNI.  She is his POA.     Past Medical History:  Diagnosis Date   Anxiety    Arthritis    Emphysema of lung (HCC)    HTN (hypertension)    Hyperlipemia    Schizoaffective disorder, bipolar type (HCC)    Substance abuse (HCC)     Patient Active Problem List   Diagnosis Date Noted   Seizure (HCC) 02/23/2023   Aspiration pneumonia (HCC) 02/23/2023   Lactic acidosis 02/23/2023   Chronic obstructive lung disease (HCC) 02/23/2023   Chronic hypoxemic respiratory failure (HCC) 12/11/2022   Thrombocytopenia (HCC) 11/13/2022   Hypokalemia 11/13/2022   Syncope and collapse 11/12/2022   Metastatic lung cancer (metastasis from lung to other site) [facility]) 10/29/2022   Palliative care encounter 10/16/2022   Sepsis due to pneumonia (HCC) 10/11/2022   Lung mass 10/11/2022   Hypertension 10/10/2022   Tobacco abuse 10/10/2022   Schizophrenia (HCC) 10/10/2022   Acute on chronic respiratory failure with hypoxia (HCC) 10/10/2022   Cough 10/10/2022   Hyponatremia 10/10/2022   CAP (community acquired pneumonia) 10/10/2022   Centrilobular emphysema (HCC) 07/30/2018     Physical Exam  Triage Vital Signs: ED Triage Vitals  Enc Vitals Group     BP 03/13/23 2206 (!) 129/94     Pulse Rate 03/13/23 2206 93     Resp  03/13/23 2206 18     Temp 03/13/23 2206 99.3 F (37.4 C)     Temp Source 03/13/23 2206 Oral     SpO2 03/13/23 2206 95 %     Weight 03/13/23 2210 213 lb 6.5 oz (96.8 kg)     Height --      Head Circumference --      Peak Flow --      Pain Score --      Pain Loc --      Pain Edu? --      Excl. in GC? --     Most recent vital signs: Vitals:   03/13/23 2206  BP: (!) 129/94  Pulse: 93  Resp: 18  Temp: 99.3 F (37.4 C)  SpO2: 95%     General: Patient is minimally responsive on well-appearing CV:  Good peripheral perfusion.  Resp:  Wet sounding cough and rhonchi throughout Abd:  No distention.  Neuro:             Patient's eyes are closed, when forcibly opened he does have a right-sided gaze deviation, increased tone in the upper extremities, does withdraw to pain in bilateral lower extremities and localizes to pain with his right upper extremity Other:     ED Results / Procedures / Treatments  Labs (all labs ordered are listed, but only abnormal results are displayed) Labs Reviewed  COMPREHENSIVE METABOLIC  PANEL - Abnormal; Notable for the following components:      Result Value   Sodium 127 (*)    Chloride 91 (*)    Glucose, Bld 129 (*)    Creatinine, Ser 0.57 (*)    Calcium 8.3 (*)    All other components within normal limits  CBC WITH DIFFERENTIAL/PLATELET - Abnormal; Notable for the following components:   RBC 3.56 (*)    Hemoglobin 10.7 (*)    HCT 32.6 (*)    All other components within normal limits  MAGNESIUM - Abnormal; Notable for the following components:   Magnesium 1.3 (*)    All other components within normal limits  CULTURE, BLOOD (ROUTINE X 2)  CULTURE, BLOOD (ROUTINE X 2)  SARS CORONAVIRUS 2 BY RT PCR  LACTIC ACID, PLASMA  URINALYSIS, W/ REFLEX TO CULTURE (INFECTION SUSPECTED)     EKG  EKG has significant artifact but shows normal sinus rhythm with normal axis and intervals and diffuse ST elevation likely early repolarization, overall appears  similar morphology to prior EKG   RADIOLOGY I reviewed and interpreted patient CT of his head which is negative for acute bleed   PROCEDURES:  Critical Care performed: Yes, see critical care procedure note(s)  Procedures  The patient is on the cardiac monitor to evaluate for evidence of arrhythmia and/or significant heart rate changes.   MEDICATIONS ORDERED IN ED: Medications  LORazepam (ATIVAN) injection 2 mg (2 mg Intravenous Given 03/13/23 2201)  LORazepam (ATIVAN) injection 2 mg (2 mg Intravenous Given 03/13/23 2200)  lactated ringers bolus 1,000 mL (0 mLs Intravenous Stopped 03/14/23 0017)  levETIRAcetam (KEPPRA) IVPB 1000 mg/100 mL premix (0 mg Intravenous Stopped 03/13/23 2222)    Followed by  levETIRAcetam (KEPPRA) IVPB 1500 mg/ 100 mL premix (0 mg Intravenous Stopped 03/13/23 2312)    Followed by  levETIRAcetam (KEPPRA) IVPB 1500 mg/ 100 mL premix (0 mg Intravenous Stopped 03/13/23 2246)  midazolam (VERSED) injection 4 mg (4 mg Intravenous Given 03/13/23 2221)  magnesium sulfate IVPB 2 g 50 mL (0 g Intravenous Stopped 03/14/23 0017)     IMPRESSION / MDM / ASSESSMENT AND PLAN / ED COURSE  I reviewed the triage vital signs and the nursing notes.                              Patient's presentation is most consistent with acute presentation with potential threat to life or bodily function.  Differential diagnosis includes, but is not limited to, nonconvulsive status, brain bleed, hemorrhagic mets, electrolyte abnormality, infectious process such as pneumonia, UTI, sepsis  The patient is a 62 year old male who has metastatic adenocarcinoma of the lung who is currently receiving palliative chemoradiation who presents today because of seizure-like activity.  He was found slumped over by family after he had been cooking.  He did have a right gaze deviation for EMS and received 2.5 mg of Versed in the field.  Arrival to ED patient still does have a gaze deviation has increased tone  in the left upper extremity seems to localize with the right upper extremity and withdraws to pain in bilateral lowers.  Patient given total 4 mg of Ativan and 4 g of Keppra.  On reassessment he does now look straight although does not entirely seem to cross the midline to the left.  He is not following commands but is moving purposefully to pull off his nonrebreather and his leads more with right them with  the left.   CT of the head is negative for bleed does show a lesion in the right temporal lobe that could be a cavernoma as well as some likely subacute ischemia in his right cerebellum.  Obtain a CT of the chest as well given patient's cough which shows overall decreasing airspace opacity but persistence of airspace opacity in the right lower lobe from prior.  Patient does feel warm to me with oral temp 99.3.  Have asked nursing to get a rectal temp.  He has no leukocytosis with negative lactate at this time.  Will not treat for pneumonia at at this time given the CT appears similar to prior.  Patient's labs are notable for hyponatremia with sodium 127 hypomagnesemia with mag of 1.3.  Blood cultures are in process will obtain urinalysis and COVID swab as well.  I discussed with Dr.Khaliqdina on-call neurologist with [facility] about patient's clinical status and whether he would benefit from transfer for continuous EEG.  He felt that because patient was doing some purposeful movements and the fact that we are limited in how aggressive we can be with seizure management given he is DNI and it would be reasonable to keep him at [facility].  Recommended watching for about another hour in the ER to see his trajectory and if not worsening or improving can likely stay at [facility].    On repeat assessment patient is still favoring his right side.  Does not seem to gaze past the midline to the left.  Discussed again with neurology at [facility] and they recommend transfer.       FINAL CLINICAL IMPRESSION(S) /  ED DIAGNOSES   Final diagnoses:  Seizure (HCC)     Rx / DC Orders   ED Discharge Orders     None        Note:  This document was prepared using Dragon voice recognition software and may include unintentional dictation errors.   Georga Hacking, MD 03/14/23 Rich Fuchs

## 2023-03-13 NOTE — ED Provider Notes (Incomplete)
[facility] Provider Note    Event Date/Time   First MD Initiated Contact with Patient 03/13/23 2348     (approximate)   History   Seizures   HPI  Trevor Montgomery is a 62 y.o. male past medical history of metastatic lung cancer, hypertension, seizures who presents because of altered mental status.  Per patient's sister he was cooking chili and was acting normally then walked into another room.  They then found him slumped over minimally responsive.  When EMS arrived he had a right gaze deviation and had some seizure-like activity in his upper extremities to receive 2.5 mg of Versed.  Patient sister who is his POA is at bedside said that he is DNR and DNI.  She is his POA.     Past Medical History:  Diagnosis Date  . Anxiety   . Arthritis   . Emphysema of lung (HCC)   . HTN (hypertension)   . Hyperlipemia   . Schizoaffective disorder, bipolar type (HCC)   . Substance abuse [facility])     Patient Active Problem List   Diagnosis Date Noted  . Seizure (HCC) 02/23/2023  . Aspiration pneumonia (HCC) 02/23/2023  . Lactic acidosis 02/23/2023  . Chronic obstructive lung disease (HCC) 02/23/2023  . Chronic hypoxemic respiratory failure (HCC) 12/11/2022  . Thrombocytopenia (HCC) 11/13/2022  . Hypokalemia 11/13/2022  . Syncope and collapse 11/12/2022  . Metastatic lung cancer (metastasis from lung to other site) (HCC) 10/29/2022  . Palliative care encounter 10/16/2022  . Sepsis due to pneumonia (HCC) 10/11/2022  . Lung mass 10/11/2022  . Hypertension 10/10/2022  . Tobacco abuse 10/10/2022  . Schizophrenia (HCC) 10/10/2022  . Acute on chronic respiratory failure with hypoxia (HCC) 10/10/2022  . Cough 10/10/2022  . Hyponatremia 10/10/2022  . CAP (community acquired pneumonia) 10/10/2022  . Centrilobular emphysema (HCC) 07/30/2018     Physical Exam  Triage Vital Signs: ED Triage Vitals  Enc Vitals Group     BP 03/13/23 2206 (!) 129/94     Pulse Rate  03/13/23 2206 93     Resp 03/13/23 2206 18     Temp 03/13/23 2206 99.3 F (37.4 C)     Temp Source 03/13/23 2206 Oral     SpO2 03/13/23 2206 95 %     Weight 03/13/23 2210 213 lb 6.5 oz (96.8 kg)     Height --      Head Circumference --      Peak Flow --      Pain Score --      Pain Loc --      Pain Edu? --      Excl. in GC? --     Most recent vital signs: Vitals:   03/13/23 2206  BP: (!) 129/94  Pulse: 93  Resp: 18  Temp: 99.3 F (37.4 C)  SpO2: 95%     General: Patient is minimally responsive on well-appearing CV:  Good peripheral perfusion.  Resp:  Wet sounding cough and rhonchi throughout Abd:  No distention.  Neuro:             Patient's eyes are closed, when forcibly opened he does have a right-sided gaze deviation, increased tone in the upper extremities, does withdraw to pain in bilateral lower extremities and localizes to pain with his right upper extremity Other:     ED Results / Procedures / Treatments  Labs (all labs ordered are listed, but only abnormal results are displayed) Labs Reviewed  COMPREHENSIVE METABOLIC  PANEL - Abnormal; Notable for the following components:      Result Value   Sodium 127 (*)    Chloride 91 (*)    Glucose, Bld 129 (*)    Creatinine, Ser 0.57 (*)    Calcium 8.3 (*)    All other components within normal limits  CBC WITH DIFFERENTIAL/PLATELET - Abnormal; Notable for the following components:   RBC 3.56 (*)    Hemoglobin 10.7 (*)    HCT 32.6 (*)    All other components within normal limits  MAGNESIUM - Abnormal; Notable for the following components:   Magnesium 1.3 (*)    All other components within normal limits  CULTURE, BLOOD (ROUTINE X 2)  CULTURE, BLOOD (ROUTINE X 2)  SARS CORONAVIRUS 2 BY RT PCR  LACTIC ACID, PLASMA  URINALYSIS, W/ REFLEX TO CULTURE (INFECTION SUSPECTED)     EKG  EKG has significant artifact but shows normal sinus rhythm with normal axis and intervals and diffuse ST elevation likely early  repolarization, overall appears similar morphology to prior EKG   RADIOLOGY I reviewed and interpreted patient CT of his head which is negative for acute bleed   PROCEDURES:  Critical Care performed: Yes, see critical care procedure note(s)  Procedures  The patient is on the cardiac monitor to evaluate for evidence of arrhythmia and/or significant heart rate changes.   MEDICATIONS ORDERED IN ED: Medications  levETIRAcetam (KEPPRA) 1000 MG/100ML IVPB (has no administration in time range)  magnesium sulfate IVPB 2 g 50 mL (2 g Intravenous New Bag/Given 03/13/23 2321)  LORazepam (ATIVAN) injection 2 mg (2 mg Intravenous Given 03/13/23 2201)  LORazepam (ATIVAN) injection 2 mg (2 mg Intravenous Given 03/13/23 2200)  lactated ringers bolus 1,000 mL (1,000 mLs Intravenous New Bag/Given 03/13/23 2209)  levETIRAcetam (KEPPRA) IVPB 1000 mg/100 mL premix (0 mg Intravenous Stopped 03/13/23 2222)    Followed by  levETIRAcetam (KEPPRA) IVPB 1500 mg/ 100 mL premix (0 mg Intravenous Stopped 03/13/23 2312)    Followed by  levETIRAcetam (KEPPRA) IVPB 1500 mg/ 100 mL premix (0 mg Intravenous Stopped 03/13/23 2246)  midazolam (VERSED) injection 4 mg (4 mg Intravenous Given 03/13/23 2221)     IMPRESSION / MDM / ASSESSMENT AND PLAN / ED COURSE  I reviewed the triage vital signs and the nursing notes.                              Patient's presentation is most consistent with acute presentation with potential threat to life or bodily function.  Differential diagnosis includes, but is not limited to, nonconvulsive status, brain bleed, hemorrhagic mets, electrolyte abnormality, infectious process such as pneumonia, UTI, sepsis  The patient is a 62 year old male who has metastatic adenocarcinoma of the lung who is currently receiving palliative chemoradiation who presents today because of seizure-like activity.  He was found slumped over by family after he had been cooking.  He did have a right gaze deviation  for EMS and received 2.5 mg of Versed in the field.  Arrival to ED patient still does have a gaze deviation has increased tone in the left upper extremity seems to localize with the right upper extremity and withdraws to pain in bilateral lowers.  Patient given total 4 mg of Ativan and 4 g of Keppra.  On reassessment he does now look straight although does not entirely seem to cross the midline to the left.  He is not following commands but is moving  purposefully to pull off his nonrebreather and his leads more with right them with the left.   CT of the head is negative for bleed does show a lesion in the right temporal lobe that could be a cavernoma as well as some likely subacute ischemia in his right cerebellum.  Obtain a CT of the chest as well given patient's cough which shows overall decreasing airspace opacity but persistence of airspace opacity in the right lower lobe from prior.  Patient does feel warm to me with oral temp 99.3.  Have asked nursing to get a rectal temp.  He has no leukocytosis with negative lactate at this time.  Will not treat for pneumonia at at this time given the CT appears similar to prior.  Patient's labs are notable for hyponatremia with sodium 127 hypomagnesemia with mag of 1.3.  Blood cultures are in process will obtain urinalysis and COVID swab as well.  I discussed with Dr.Khaliqdina on-call       FINAL CLINICAL IMPRESSION(S) / ED DIAGNOSES   Final diagnoses:  None     Rx / DC Orders   ED Discharge Orders     None        Note:  This document was prepared using Dragon voice recognition software and may include unintentional dictation errors.

## 2023-03-13 NOTE — ED Notes (Signed)
Pt transported to CT, accompanied by this nurse

## 2023-03-13 NOTE — ED Notes (Signed)
Pt able to communicate, answering all questions appropriately (A&O x3) with the exception of event. Speech is clear

## 2023-03-13 NOTE — ED Triage Notes (Signed)
Pt arrives via [facility] with c/o unresponsive possible seizures. Pt was cooking before being found in his room unresponsive by family. EMS found pt unresponsive focal seizure like activity reported by EMS. They gave 2.5 versed IV en-route, twitching decreased following versed admin. Pt has a known hx of seizures, family reports compliance with medication regimen. Pt on NRB, MD at bedside.

## 2023-03-14 ENCOUNTER — Inpatient Hospital Stay (HOSPITAL_COMMUNITY)
Admission: RE | Admit: 2023-03-14 | Discharge: 2023-03-24 | Disposition: A | Discharge status: Hospice | Payer: Medicare Other | Source: Ambulatory Visit | Attending: Internal Medicine | Admitting: Internal Medicine

## 2023-03-14 ENCOUNTER — Encounter (HOSPITAL_COMMUNITY): Payer: Self-pay

## 2023-03-14 ENCOUNTER — Encounter (HOSPITAL_COMMUNITY): Payer: Self-pay | Admitting: Internal Medicine

## 2023-03-14 ENCOUNTER — Observation Stay (HOSPITAL_COMMUNITY): Payer: Medicare Other

## 2023-03-14 ENCOUNTER — Other Ambulatory Visit: Payer: Self-pay

## 2023-03-14 DIAGNOSIS — Z87891 Personal history of nicotine dependence: Secondary | ICD-10-CM

## 2023-03-14 DIAGNOSIS — Z609 Problem related to social environment, unspecified: Secondary | ICD-10-CM | POA: Diagnosis present

## 2023-03-14 DIAGNOSIS — G40909 Epilepsy, unspecified, not intractable, without status epilepticus: Secondary | ICD-10-CM | POA: Diagnosis present

## 2023-03-14 DIAGNOSIS — R45851 Suicidal ideations: Secondary | ICD-10-CM | POA: Diagnosis not present

## 2023-03-14 DIAGNOSIS — F05 Delirium due to known physiological condition: Secondary | ICD-10-CM | POA: Diagnosis not present

## 2023-03-14 DIAGNOSIS — F250 Schizoaffective disorder, bipolar type: Secondary | ICD-10-CM | POA: Diagnosis present

## 2023-03-14 DIAGNOSIS — G936 Cerebral edema: Secondary | ICD-10-CM | POA: Diagnosis not present

## 2023-03-14 DIAGNOSIS — J9611 Chronic respiratory failure with hypoxia: Secondary | ICD-10-CM | POA: Diagnosis present

## 2023-03-14 DIAGNOSIS — G928 Other toxic encephalopathy: Secondary | ICD-10-CM | POA: Diagnosis not present

## 2023-03-14 DIAGNOSIS — F209 Schizophrenia, unspecified: Secondary | ICD-10-CM | POA: Diagnosis not present

## 2023-03-14 DIAGNOSIS — I10 Essential (primary) hypertension: Secondary | ICD-10-CM | POA: Diagnosis not present

## 2023-03-14 DIAGNOSIS — E222 Syndrome of inappropriate secretion of antidiuretic hormone: Secondary | ICD-10-CM | POA: Diagnosis not present

## 2023-03-14 DIAGNOSIS — E785 Hyperlipidemia, unspecified: Secondary | ICD-10-CM | POA: Diagnosis present

## 2023-03-14 DIAGNOSIS — Z6831 Body mass index (BMI) 31.0-31.9, adult: Secondary | ICD-10-CM | POA: Diagnosis not present

## 2023-03-14 DIAGNOSIS — Z515 Encounter for palliative care: Secondary | ICD-10-CM

## 2023-03-14 DIAGNOSIS — E669 Obesity, unspecified: Secondary | ICD-10-CM | POA: Diagnosis not present

## 2023-03-14 DIAGNOSIS — E876 Hypokalemia: Secondary | ICD-10-CM | POA: Diagnosis not present

## 2023-03-14 DIAGNOSIS — F419 Anxiety disorder, unspecified: Secondary | ICD-10-CM | POA: Diagnosis not present

## 2023-03-14 DIAGNOSIS — R569 Unspecified convulsions: Secondary | ICD-10-CM

## 2023-03-14 DIAGNOSIS — Z66 Do not resuscitate: Secondary | ICD-10-CM | POA: Diagnosis present

## 2023-03-14 DIAGNOSIS — Z79899 Other long term (current) drug therapy: Secondary | ICD-10-CM

## 2023-03-14 DIAGNOSIS — T426X5A Adverse effect of other antiepileptic and sedative-hypnotic drugs, initial encounter: Secondary | ICD-10-CM | POA: Diagnosis not present

## 2023-03-14 DIAGNOSIS — R682 Dry mouth, unspecified: Secondary | ICD-10-CM | POA: Diagnosis not present

## 2023-03-14 DIAGNOSIS — J449 Chronic obstructive pulmonary disease, unspecified: Secondary | ICD-10-CM | POA: Diagnosis present

## 2023-03-14 DIAGNOSIS — Z5986 Financial insecurity: Secondary | ICD-10-CM

## 2023-03-14 DIAGNOSIS — Z791 Long term (current) use of non-steroidal anti-inflammatories (NSAID): Secondary | ICD-10-CM

## 2023-03-14 DIAGNOSIS — C7931 Secondary malignant neoplasm of brain: Secondary | ICD-10-CM | POA: Diagnosis not present

## 2023-03-14 DIAGNOSIS — M40204 Unspecified kyphosis, thoracic region: Secondary | ICD-10-CM | POA: Diagnosis present

## 2023-03-14 DIAGNOSIS — J690 Pneumonitis due to inhalation of food and vomit: Secondary | ICD-10-CM | POA: Diagnosis present

## 2023-03-14 DIAGNOSIS — J439 Emphysema, unspecified: Secondary | ICD-10-CM | POA: Diagnosis not present

## 2023-03-14 DIAGNOSIS — C3490 Malignant neoplasm of unspecified part of unspecified bronchus or lung: Secondary | ICD-10-CM | POA: Diagnosis present

## 2023-03-14 DIAGNOSIS — Z9981 Dependence on supplemental oxygen: Secondary | ICD-10-CM

## 2023-03-14 DIAGNOSIS — T380X5A Adverse effect of glucocorticoids and synthetic analogues, initial encounter: Secondary | ICD-10-CM | POA: Diagnosis not present

## 2023-03-14 DIAGNOSIS — Z8701 Personal history of pneumonia (recurrent): Secondary | ICD-10-CM

## 2023-03-14 DIAGNOSIS — Z823 Family history of stroke: Secondary | ICD-10-CM

## 2023-03-14 DIAGNOSIS — E871 Hypo-osmolality and hyponatremia: Secondary | ICD-10-CM | POA: Diagnosis not present

## 2023-03-14 DIAGNOSIS — Z7960 Long term (current) use of unspecified immunomodulators and immunosuppressants: Secondary | ICD-10-CM

## 2023-03-14 DIAGNOSIS — I639 Cerebral infarction, unspecified: Secondary | ICD-10-CM

## 2023-03-14 DIAGNOSIS — R000 Tachycardia, unspecified: Secondary | ICD-10-CM | POA: Diagnosis present

## 2023-03-14 LAB — URINALYSIS, W/ REFLEX TO CULTURE (INFECTION SUSPECTED)
Bacteria, UA: NONE SEEN
Bilirubin Urine: NEGATIVE
Glucose, UA: NEGATIVE mg/dL
Hgb urine dipstick: NEGATIVE
Ketones, ur: NEGATIVE mg/dL
Leukocytes,Ua: NEGATIVE
Nitrite: NEGATIVE
Protein, ur: NEGATIVE mg/dL
Specific Gravity, Urine: 1.005 (ref 1.005–1.030)
Squamous Epithelial / HPF: NONE SEEN /HPF (ref 0–5)
WBC, UA: NONE SEEN WBC/hpf (ref 0–5)
pH: 7 (ref 5.0–8.0)

## 2023-03-14 LAB — CBC
HCT: 32.6 % — ABNORMAL LOW (ref 39.0–52.0)
Hemoglobin: 10.9 g/dL — ABNORMAL LOW (ref 13.0–17.0)
MCH: 29.3 pg (ref 26.0–34.0)
MCHC: 33.4 g/dL (ref 30.0–36.0)
MCV: 87.6 fL (ref 80.0–100.0)
Platelets: 293 10*3/uL (ref 150–400)
RBC: 3.72 MIL/uL — ABNORMAL LOW (ref 4.22–5.81)
RDW: 13.2 % (ref 11.5–15.5)
WBC: 6.2 10*3/uL (ref 4.0–10.5)
nRBC: 0 % (ref 0.0–0.2)

## 2023-03-14 LAB — BASIC METABOLIC PANEL
Anion gap: 15 (ref 5–15)
BUN: <5 mg/dL — ABNORMAL LOW (ref 6–20)
CO2: 27 mmol/L (ref 22–32)
Calcium: 9.3 mg/dL (ref 8.9–10.3)
Chloride: 91 mmol/L — ABNORMAL LOW (ref 98–111)
Creatinine, Ser: 0.54 mg/dL — ABNORMAL LOW (ref 0.61–1.24)
GFR, Estimated: >60 mL/min (ref >60)
Glucose, Bld: 102 mg/dL — ABNORMAL HIGH (ref 70–99)
Potassium: 3.4 mmol/L — ABNORMAL LOW (ref 3.5–5.1)
Sodium: 133 mmol/L — ABNORMAL LOW (ref 135–145)

## 2023-03-14 LAB — SARS CORONAVIRUS 2 BY RT PCR: SARS Coronavirus 2 by RT PCR: NEGATIVE

## 2023-03-14 LAB — MAGNESIUM: Magnesium: 1.7 mg/dL (ref 1.7–2.4)

## 2023-03-14 MED ORDER — POLYETHYLENE GLYCOL 3350 17 G PO PACK: 17.0000 g | PACK | Freq: Every day | ORAL | Status: DC | PRN

## 2023-03-14 MED ORDER — SODIUM CHLORIDE 0.9 % IV SOLN
75.0000 mL/h | INTRAVENOUS | Status: DC
  Administered 2023-03-14 – 2023-03-15 (×2): 75 mL/h via INTRAVENOUS

## 2023-03-14 MED ORDER — PANTOPRAZOLE SODIUM 40 MG IV SOLR
40.0000 mg | INTRAVENOUS | Status: DC
  Administered 2023-03-14 – 2023-03-18 (×5): 40 mg via INTRAVENOUS
  Filled 2023-03-14 (×5): qty 10

## 2023-03-14 MED ORDER — MAGNESIUM SULFATE 4 GM/100ML IV SOLN
4.0000 g | Freq: Once | INTRAVENOUS | Status: AC
  Administered 2023-03-14: 4 g via INTRAVENOUS
  Filled 2023-03-14: qty 100

## 2023-03-14 MED ORDER — ACETAMINOPHEN 325 MG PO TABS
650.0000 mg | ORAL_TABLET | ORAL | Status: DC | PRN
  Filled 2023-03-14: qty 2

## 2023-03-14 MED ORDER — ACETAMINOPHEN 650 MG RE SUPP: 650.0000 mg | RECTAL | Status: DC | PRN

## 2023-03-14 MED ORDER — SODIUM CHLORIDE 0.9 % IV SOLN
3.0000 g | Freq: Four times a day (QID) | INTRAVENOUS | Status: AC
  Administered 2023-03-14 – 2023-03-19 (×22): 3 g via INTRAVENOUS
  Filled 2023-03-14 (×22): qty 8

## 2023-03-14 MED ORDER — ENOXAPARIN SODIUM 40 MG/0.4ML IJ SOSY
40.0000 mg | PREFILLED_SYRINGE | INTRAMUSCULAR | Status: DC
  Administered 2023-03-14: 40 mg via SUBCUTANEOUS
  Filled 2023-03-14: qty 0.4

## 2023-03-14 MED ORDER — ONDANSETRON HCL 4 MG PO TABS: 4.0000 mg | ORAL_TABLET | Freq: Four times a day (QID) | ORAL | Status: DC | PRN

## 2023-03-14 MED ORDER — LORAZEPAM 2 MG/ML IJ SOLN: 2.0000 mg | INTRAMUSCULAR | Status: DC | PRN

## 2023-03-14 MED ORDER — FLUPHENAZINE HCL 5 MG PO TABS
5.0000 mg | ORAL_TABLET | Freq: Every day | ORAL | Status: DC
  Administered 2023-03-14 – 2023-03-24 (×11): 5 mg via ORAL
  Filled 2023-03-14 (×11): qty 1

## 2023-03-14 MED ORDER — ORAL CARE MOUTH RINSE: 15.0000 mL | OROMUCOSAL | Status: DC | PRN

## 2023-03-14 MED ORDER — SODIUM CHLORIDE 0.9 % IV SOLN: 75.0000 mL/h | INTRAVENOUS | Status: DC

## 2023-03-14 MED ORDER — ONDANSETRON HCL 4 MG/2ML IJ SOLN
4.0000 mg | Freq: Four times a day (QID) | INTRAMUSCULAR | Status: DC | PRN
  Administered 2023-03-15 – 2023-03-19 (×2): 4 mg via INTRAVENOUS
  Filled 2023-03-14 (×2): qty 2

## 2023-03-14 MED ORDER — LEVETIRACETAM IN NACL 1000 MG/100ML IV SOLN
1000.0000 mg | Freq: Two times a day (BID) | INTRAVENOUS | Status: DC
  Administered 2023-03-14 – 2023-03-19 (×11): 1000 mg via INTRAVENOUS
  Filled 2023-03-14 (×11): qty 100

## 2023-03-14 MED ORDER — IPRATROPIUM-ALBUTEROL 0.5-2.5 (3) MG/3ML IN SOLN: 3.0000 mL | RESPIRATORY_TRACT | Status: DC | PRN

## 2023-03-14 MED ORDER — LISINOPRIL 10 MG PO TABS
10.0000 mg | ORAL_TABLET | Freq: Every day | ORAL | Status: DC
  Administered 2023-03-14 – 2023-03-16 (×3): 10 mg via ORAL
  Filled 2023-03-14 (×3): qty 1

## 2023-03-14 MED ORDER — HYDRALAZINE HCL 20 MG/ML IJ SOLN
10.0000 mg | Freq: Four times a day (QID) | INTRAMUSCULAR | Status: DC | PRN
  Administered 2023-03-15 – 2023-03-19 (×5): 10 mg via INTRAVENOUS
  Filled 2023-03-14 (×4): qty 1

## 2023-03-14 MED ORDER — ALBUTEROL SULFATE (2.5 MG/3ML) 0.083% IN NEBU: 2.5000 mg | INHALATION_SOLUTION | RESPIRATORY_TRACT | Status: DC | PRN

## 2023-03-14 MED ORDER — GUAIFENESIN ER 600 MG PO TB12
1200.0000 mg | ORAL_TABLET | Freq: Two times a day (BID) | ORAL | Status: AC
  Administered 2023-03-14 – 2023-03-20 (×14): 1200 mg via ORAL
  Filled 2023-03-14 (×14): qty 2

## 2023-03-14 MED ORDER — PROPRANOLOL HCL 10 MG PO TABS
5.0000 mg | ORAL_TABLET | Freq: Two times a day (BID) | ORAL | Status: DC
  Administered 2023-03-14 – 2023-03-19 (×11): 5 mg via ORAL
  Filled 2023-03-14 (×13): qty 0.5

## 2023-03-14 MED ORDER — POTASSIUM CHLORIDE CRYS ER 20 MEQ PO TBCR
20.0000 meq | EXTENDED_RELEASE_TABLET | Freq: Once | ORAL | Status: AC
  Administered 2023-03-14: 20 meq via ORAL
  Filled 2023-03-14: qty 1

## 2023-03-14 MED ORDER — LORAZEPAM 2 MG/ML IJ SOLN: 2.0000 mg | INTRAMUSCULAR | Status: AC

## 2023-03-14 NOTE — Evaluation (Signed)
Clinical/Bedside Swallow Evaluation Patient Details  Name: Trevor Montgomery MRN: [id number] Date of Birth: 1961-01-09  Today's Date: 03/14/2023 Time: SLP Start Time (ACUTE ONLY): 1022 SLP Stop Time (ACUTE ONLY): 1033 SLP Time Calculation (min) (ACUTE ONLY): 11 min  Past Medical History:  Past Medical History:  Diagnosis Date   Anxiety    Arthritis    Emphysema of lung (HCC)    HTN (hypertension)    Hyperlipemia    Schizoaffective disorder, bipolar type (HCC)    Substance abuse (HCC)    Past Surgical History:  Past Surgical History:  Procedure Laterality Date   FINGER SURGERY     IR IMAGING GUIDED PORT INSERTION  11/04/2022   VIDEO BRONCHOSCOPY WITH ENDOBRONCHIAL ULTRASOUND Left 10/11/2022   Procedure: VIDEO BRONCHOSCOPY WITH ENDOBRONCHIAL ULTRASOUND;  Surgeon: Trevor Chute, MD;  Location: [facility] ORS;  Service: Pulmonary;  Laterality: Left;   HPI:  Pt is a 62 y.o. male who presented secondary to becoming poorly responsive. EMS noted pt to have right gaze deviation and jerking of upper extremities concerning for seizures; pt loaded with Keppra and Ativan. CT head 6/20: Hyperdense lesion in the posterior aspect of the right temporal lobe suspicious for underlying cavernoma. MRI pending at time of evaluation. PMH: metastatic lung cancer on palliative chemoradiotherapy, HTN, HLD, COPD, provoked seizures in the setting of electrolyte imbalance and on Keppra 500 BID who was at his baseline. BSE and treatment June, 2024 and a regular texture diet with thin liquids was ultimately recommended    Assessment / Plan / Recommendation  Clinical Impression  Pt was seen for bedside swallow evaluation. Pt reported that he has had a sore throat ever since he "swallowed (his) tongue", but he denied any signs of aspiration. Oral mechanism exam was [facility] and reduced dentition was noted. Pt exhibited symptoms of pharyngeal dysphagia characterized by signs of aspiration with thin liquids and regular texture solids. A  modified barium swallow study is recommended at this time and diet recommendations will be deferred until the study is completed. SLP Visit Diagnosis: Dysphagia, unspecified (R13.10)    Aspiration Risk  Mild aspiration risk    Diet Recommendation NPO   Medication Administration: Crushed with puree (or whole with puree)    Other  Recommendations Oral Care Recommendations: Oral care BID    Recommendations for follow up therapy are one component of a multi-disciplinary discharge planning process, led by the attending physician.  Recommendations may be updated based on patient status, additional functional criteria and insurance authorization.  Follow up Recommendations  (TBD)      Assistance Recommended at Discharge    Functional Status Assessment Patient has had a recent decline in their functional status and demonstrates the ability to make significant improvements in function in a reasonable and predictable amount of time.  Frequency and Duration min 2x/week  2 weeks       Prognosis Prognosis for improved oropharyngeal function: Good      Swallow Study   General Date of Onset: 03/13/23 HPI: Pt is a 62 y.o. male who presented secondary to becoming poorly responsive. EMS noted pt to have right gaze deviation and jerking of upper extremities concerning for seizures; pt loaded with Keppra and Ativan. CT head 6/20: Hyperdense lesion in the posterior aspect of the right temporal lobe suspicious for underlying cavernoma. MRI pending at time of evaluation. PMH: metastatic lung cancer on palliative chemoradiotherapy, HTN, HLD, COPD, provoked seizures in the setting of electrolyte imbalance and on Keppra 500 BID who was at his  baseline. BSE and treatment June, 2024 and a regular texture diet with thin liquids was ultimately recommended Type of Study: Bedside Swallow Evaluation Previous Swallow Assessment: See HPI Diet Prior to this Study: NPO Temperature Spikes Noted: No Respiratory Status:  Nasal cannula History of Recent Intubation: No Behavior/Cognition: Alert;Cooperative;Pleasant mood Oral Cavity Assessment: Within Functional Limits Oral Care Completed by SLP: No Oral Cavity - Dentition: Missing dentition;Poor condition Vision: Functional for self-feeding Self-Feeding Abilities: Needs assist Patient Positioning: Upright in bed Baseline Vocal Quality: Normal Volitional Cough: Weak;Congested Volitional Swallow: Able to elicit    Oral/Motor/Sensory Function Overall Oral Motor/Sensory Function: Within functional limits   Ice Chips Ice chips: Within functional limits Presentation: Spoon   Thin Liquid Thin Liquid: Impaired Presentation: Straw;Cup Pharyngeal  Phase Impairments: Cough - Immediate;Cough - Delayed    Nectar Thick Nectar Thick Liquid: Not tested   Honey Thick Honey Thick Liquid: Not tested   Puree Puree: Within functional limits Presentation: Spoon   Solid     Solid: Impaired Presentation: Self Fed Pharyngeal Phase Impairments: Cough - Delayed     Trevor Montgomery I. Trevor Clock, MS, CCC-SLP Neuro Diagnostic Specialist  Acute Rehabilitation Services Office number: [phone]  Trevor Montgomery 03/14/2023,11:07 AM

## 2023-03-14 NOTE — H&P (Addendum)
History and Physical    Patient: Trevor Montgomery ZOX:[id number] DOB: Feb 06, 1961 DOA: 03/14/2023 DOS: the patient was seen and examined on 03/14/2023 PCP: Barbette Reichmann, MD  Patient coming from: Home  Chief Complaint: No chief complaint on file.  HPI:   Trevor Montgomery is a 62 y.o. male with PMH significant for metastatic lung cancer on palliative chemoradiotherapy, HTN, HLD, COPD, provoked seizures in the setting of electrolyte imbalance and on Keppra 500 BID who was at his baseline. He was cooking and then slumped over and was poorly responsive. EMS was called and he was noted to have (R) gaze deviation and jerking of upper extremities concerning for seizures. He was given versed enroute to [facility] ED.   On arrival to [facility] ED he had persistent (R) gaze deviation and was given Keppra 4g and Ativan 4mg . This resulted in resolution of gaze deviation but he was significantly obtunded. 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 He was watched in the ED at [facility] for an hour and noted to favor (R) side and had continued (R) gaze preference and not crossing midline. He was transferred to [facility] for urgent neurology evaluation and cEEG.    His symptoms improved shortly before departure from [facility] and on arrival to [facility] was moving all extremities spontaneously and on command.  Review of Systems: As mentioned in the history of present illness. All other systems reviewed and are negative.  Past Medical History:  Diagnosis Date   Anxiety    Arthritis    Emphysema of lung (HCC)    HTN (hypertension)    Hyperlipemia    Schizoaffective disorder, bipolar type (HCC)    Substance abuse (HCC)    Past Surgical History:  Procedure Laterality Date   FINGER SURGERY     IR IMAGING GUIDED PORT INSERTION  11/04/2022   VIDEO BRONCHOSCOPY WITH ENDOBRONCHIAL ULTRASOUND Left 10/11/2022   Procedure: VIDEO BRONCHOSCOPY WITH ENDOBRONCHIAL ULTRASOUND;  Surgeon: Raechel Chute, MD;  Location: [facility] ORS;  Service: Pulmonary;  Laterality: Left;   Social History:  reports that he quit smoking about 4 months ago. His smoking use included cigarettes. He has a 20.00 pack-year smoking history. He quit smokeless tobacco use about 4 months ago. He reports that he does not drink alcohol and does not use drugs.  Allergies  Allergen Reactions   Fluphenazine     Other Reaction(s): Unknown    Family History  Problem Relation Age of Onset   Stroke Father    Seizures Father    Stroke Sister    Cancer Maternal Grandmother     Prior to Admission medications   Medication Sig Start Date End Date Taking? Authorizing Provider  albuterol (VENTOLIN HFA) 108 (90 Base) MCG/ACT inhaler Inhale 2 puffs into the lungs every 6 (six) hours as needed for wheezing or shortness of breath. 09/13/22   [provider]  benzonatate (TESSALON) 100 MG capsule Take 1 capsule (100 mg total) by mouth 4 (four) times daily as needed for cough. 02/25/23   Marcelino Duster, MD  benztropine (COGENTIN) 0.5 MG tablet Take 0.5 mg by mouth 2 (two) times daily.    [provider]  bisacodyl (DULCOLAX) 5 MG EC tablet Take 1 tablet (5 mg total) by mouth daily as needed for moderate constipation. Patient not taking: Reported on 02/28/2023 10/16/22   Sunnie Nielsen, DO  docusate sodium (COLACE) 100 MG capsule Take 1 capsule (100 mg total) by mouth 2 (two)  times daily as needed for mild constipation. Patient not taking: Reported on 02/28/2023 10/16/22   Sunnie Nielsen, DO  fluPHENAZine (PROLIXIN) 5 MG tablet Take 5 mg by mouth daily.    [provider]  guaiFENesin (MUCINEX) 600 MG 12 hr tablet Take 1 tablet (600 mg total) by mouth 2 (two) times daily as needed for cough. 10/16/22   Sunnie Nielsen, DO  HYDROcodone-acetaminophen (NORCO/VICODIN) 5-325 MG tablet Take 1-2 tablets by mouth every 6 (six) hours as needed for moderate pain. Patient not taking: Reported on 02/23/2023  11/06/22   Jeralyn Ruths, MD  ipratropium-albuterol (DUONEB) 0.5-2.5 (3) MG/3ML SOLN Take 3 mLs by nebulization every 4 (four) hours as needed. 11/11/22   Borders, Daryl Eastern, NP  levETIRAcetam (KEPPRA) 500 MG tablet Take 1 tablet (500 mg total) by mouth 2 (two) times daily. 02/25/23   Marcelino Duster, MD  lidocaine-prilocaine (EMLA) cream Apply to affected area once 10/29/22   Jeralyn Ruths, MD  lisinopril (ZESTRIL) 10 MG tablet Take 1 tablet (10 mg total) by mouth daily. 02/26/23   Marcelino Duster, MD  meloxicam (MOBIC) 15 MG tablet Take 15 mg by mouth daily. 05/08/22 05/08/23  [provider]  menthol-cetylpyridinium (CEPACOL) 3 MG lozenge Take 1 lozenge (3 mg total) by mouth as needed for sore throat. 02/25/23   Marcelino Duster, MD  Mouthwashes (MOUTH RINSE) LIQD solution 15 mLs by Mouth Rinse route as needed (oral care). 02/25/23   Marcelino Duster, MD  Multiple Vitamin (MULTI-VITAMIN) tablet Take 1 tablet by mouth daily.    [provider]  potassium chloride SA (KLOR-CON M) 20 MEQ tablet Take 1 tablet (20 mEq total) by mouth 2 (two) times daily. 01/28/23   Jeralyn Ruths, MD  propranolol (INDERAL) 10 MG tablet Take 0.5 tablets (5 mg total) by mouth 2 (two) times daily. 10/16/22   Sunnie Nielsen, DO  simvastatin (ZOCOR) 20 MG tablet Take 20 mg by mouth at bedtime. 05/02/22   [provider]  sodium chloride 1 g tablet Take 1 tablet (1 g total) by mouth 3 (three) times daily. 11/12/22   Jeralyn Ruths, MD  Vitamin E 400 units TABS Take 400 Units by mouth daily.    [provider]    Physical Exam: Vitals:   03/14/23 0400  BP: (!) 170/110  Resp: (!) 24  Temp: 97.8 F (36.6 C)  TempSrc: Oral  SpO2: 100%    Constitutional: NAD, calm, comfortable Eyes: PERRL, lids and conjunctivae normal ENMT: Mucous membranes are moist. Posterior pharynx clear of any exudate or lesions.Normal dentition.  Neck: normal, supple, no masses,  no thyromegaly, kyphosis Respiratory: Rhonchi in (L) upper lobe, otherwise clear to auscultation, no wheezing, no crackles. Normal respiratory effort. No accessory muscle use.  Cardiovascular: Regular rate and rhythm, no murmurs / rubs / gallops. No extremity edema. 2+ radial and pedal pulses. No carotid bruits.  Abdomen: no tenderness, no masses palpated. No hepatosplenomegaly. Bowel sounds positive.  Musculoskeletal: no clubbing / cyanosis. No joint deformity upper and lower extremities. Good ROM, no contractures. Normal muscle tone.  Skin: no rashes, lesions, ulcers. No induration Neurologic: CN 2-12 grossly intact. Sensation intact. Drowsy, easily arousable. Follows commands. (+) movement of all extremities. Oriented to person, place, time. Strength 5/5 x all 4 extremities. Psychiatric: Cooperative  Data Reviewed:  CBC    Component Value Date/Time   WBC 6.2 03/14/2023 0636   RBC 3.72 (L) 03/14/2023 0636   HGB 10.9 (L) 03/14/2023 0636   HGB 8.9 (L)  12/09/2022 1305   HCT 32.6 (L) 03/14/2023 0636   PLT 293 03/14/2023 0636   PLT 329 12/09/2022 1305   MCV 87.6 03/14/2023 0636   MCH 29.3 03/14/2023 0636   MCHC 33.4 03/14/2023 0636   RDW 13.2 03/14/2023 0636   LYMPHSABS 1.5 03/13/2023 2201   MONOABS 0.5 03/13/2023 2201   EOSABS 0.4 03/13/2023 2201   BASOSABS 0.0 03/13/2023 2201   CMP     Component Value Date/Time   NA 133 (L) 03/14/2023 0636   K 3.4 (L) 03/14/2023 0636   CL 91 (L) 03/14/2023 0636   CO2 27 03/14/2023 0636   GLUCOSE 102 (H) 03/14/2023 0636   BUN <5 (L) 03/14/2023 0636   CREATININE 0.54 (L) 03/14/2023 0636   CREATININE 0.40 (L) 02/28/2023 0941   CALCIUM 9.3 03/14/2023 0636   PROT 6.6 03/13/2023 2201   ALBUMIN 3.9 03/13/2023 2201   AST 16 03/13/2023 2201   AST 42 (H) 02/28/2023 0941   ALT 12 03/13/2023 2201   ALT 25 02/28/2023 0941   ALKPHOS 76 03/13/2023 2201   BILITOT 0.5 03/13/2023 2201   BILITOT 0.6 02/28/2023 0941   GFRNONAA >60 03/14/2023 0636    GFRNONAA >60 02/28/2023 0941   Lactic Acid, Venous    Component Value Date/Time   LATICACIDVEN 1.1 03/13/2023 2201   Magnesium    Component Value Date/Time   MAGNESIUM 1.3 03/13/2023 2201    Results for orders placed or performed during the hospital encounter of 03/13/23  Blood culture (routine x 2)     Status: None (Preliminary result)   Collection Time: 03/13/23 10:01 PM   Specimen: BLOOD RIGHT ARM  Result Value Ref Range Status   Specimen Description BLOOD RIGHT ARM  Final   Special Requests   Final    BOTTLES DRAWN AEROBIC AND ANAEROBIC Blood Culture adequate volume   Culture   Final    NO GROWTH < 12 HOURS Performed at [facility], [address] [place], [place] [address]    Report Status PENDING  Incomplete  Blood culture (routine x 2)     Status: None (Preliminary result)   Collection Time: 03/13/23 10:01 PM   Specimen: BLOOD LEFT ARM  Result Value Ref Range Status   Specimen Description BLOOD LEFT ARM  Final   Special Requests   Final    BOTTLES DRAWN AEROBIC AND ANAEROBIC Blood Culture adequate volume   Culture   Final    NO GROWTH < 12 HOURS Performed at [facility], [address] [place], [place] [address]    Report Status PENDING  Incomplete  SARS Coronavirus 2 by RT PCR (hospital order, performed in [facility] hospital lab) *cepheid single result test* Anterior Nasal Swab     Status: None   Collection Time: 03/13/23 11:31 PM   Specimen: Anterior Nasal Swab  Result Value Ref Range Status   SARS Coronavirus 2 by RT PCR NEGATIVE NEGATIVE Final    Comment: (NOTE) SARS-CoV-2 target nucleic acids are NOT DETECTED.  The SARS-CoV-2 RNA is generally detectable in upper and lower respiratory specimens during the acute phase of infection. The lowest concentration of SARS-CoV-2 viral copies this assay can detect is 250 copies / mL. A negative result does not preclude SARS-CoV-2 infection and should not be used as the sole basis for  treatment or other patient management decisions.  A negative result may occur with improper specimen collection / handling, submission of specimen other than nasopharyngeal swab, presence of viral mutation(s) within the  areas targeted by this assay, and inadequate number of viral copies (<250 copies / mL). A negative result must be combined with clinical observations, patient history, and epidemiological information.  Fact Sheet for Patients:   RoadLapTop.co.za  Fact Sheet for Healthcare Providers: http://kim-miller.com/  This test is not yet approved or  cleared by the [place] FDA and has been authorized for detection and/or diagnosis of SARS-CoV-2 by FDA under an Emergency Use Authorization (EUA).  This EUA will remain in effect (meaning this test can be used) for the duration of the COVID-19 declaration under Section 564(b)(1) of the Act, 21 U.S.C. section 360bbb-3(b)(1), unless the authorization is terminated or revoked sooner.  Performed at [facility], [address] [place], [place] [address]    CT Chest Wo Contrast  Result Date: 03/13/2023 CLINICAL DATA:  Unresponsive and possible seizures. EXAM: CT CHEST WITHOUT CONTRAST TECHNIQUE: Multidetector CT imaging of the chest was performed following the standard protocol without IV contrast. RADIATION DOSE REDUCTION: This exam was performed according to the departmental dose-optimization program which includes automated exposure control, adjustment of the mA and/or kV according to patient size and/or use of iterative reconstruction technique. COMPARISON:  Chest radiograph 03/13/2023 and CTA chest 02/23/2023 FINDINGS: Cardiovascular: Trace pericardial effusion. Mild aortic and coronary artery atherosclerotic calcification. Right chest wall Port-A-Cath tip at the superior cavoatrial junction. Mediastinum/Nodes: Small hiatal hernia containing fluid. Esophagus is otherwise  unremarkable. Question secretions in the lower trachea though evaluation is limited by respiratory motion. Possible enlarged left paratracheal lymph node on 2/42 measuring 13 mm. This was not well visualized previously. Similar 9 mm right paratracheal lymph node. Lungs/Pleura: Advanced emphysema. Scarring, architectural distortion, bronchiectasis/bronchiolectasis about the superior left hilum likely due to radiation change. Decreased patchy airspace opacity throughout the left upper lobe since 02/23/2023. Respiratory motion obscures detail however there is some residual ill-defined ground-glass and nodular opacities. For example along the posterior left lower lobe on 4/68). Similar airspace disease in the right lower lobe. There is likely debris within both mainstem bronchi and multiple lobar and subsegmental bronchi however motion limits assessment. No pleural effusion or pneumothorax. Upper Abdomen: No acute abnormality. Musculoskeletal: No acute fracture. Remote left glenoid fossa fracture. Multiple sclerotic lesions in the spine are similar to prior. Exaggerated thoracic kyphosis. IMPRESSION: 1. Decreased patchy airspace opacity throughout the left upper lobe since 02/23/2023 with some residual ill-defined ground-glass and nodular opacities. Similar airspace disease in the right lower lobe. 2. Likely debris within the trachea and both mainstem bronchi suggesting aspiration. 3. Enlarged left paratracheal lymph node measuring 13 mm. This was not well visualized previously. Continued attention on follow-up. 4. 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 5. Small hiatal hernia containing fluid. Aortic Atherosclerosis (ICD10-I70.0) and Emphysema (ICD10-J43.9). Electronically Signed   By: Minerva Fester M.D.   On: 03/13/2023 23:27   CT HEAD WO CONTRAST ( )  Result Date: 03/13/2023 CLINICAL DATA:  Recent seizure activity with headaches and neck pain, initial encounter EXAM: CT HEAD WITHOUT  CONTRAST CT CERVICAL SPINE WITHOUT CONTRAST TECHNIQUE: Multidetector CT imaging of the head and cervical spine was performed following the standard protocol without intravenous contrast. Multiplanar CT image reconstructions of the cervical spine were also generated. RADIATION DOSE REDUCTION: This exam was performed according to the departmental dose-optimization program which includes automated exposure control, adjustment of the mA and/or kV according to patient size and/or use of iterative reconstruction technique. COMPARISON:  02/23/2019 FINDINGS: CT HEAD FINDINGS Brain: No acute hemorrhage is identified. In  the posterior right temporal lobe best seen on axial image number 18 of series 4 there is a 1.1 cm mildly hyperdense lesion identified. No surrounding edema is seen. No other focal mass lesion is noted. Geographic area of decreased attenuation is noted with right superior cerebellum best seen on axial image number 12 of series 4. This is suspicious for subacute ischemia. This was not present on the prior exam. Vascular: No hyperdense vessel or unexpected calcification. Skull: Normal. Negative for fracture or focal lesion. Sinuses/Orbits: No acute finding. Other: None. CT CERVICAL SPINE FINDINGS Alignment: Within normal limits. Skull base and vertebrae: 7 cervical segments are well visualized. Vertebral body height is well maintained. Multilevel osteophytic change and facet hypertrophic changes are noted. No acute fracture or acute facet abnormality is noted. Sclerotic foci are noted in the anterior aspect of the T5 and T6 vertebral body suspicious for bony metastatic disease. Soft tissues and spinal canal: Surrounding soft tissue structures are within normal limits. Right chest wall port is seen. Upper chest: Emphysematous changes are noted. No other focal abnormality is noted. Other: None IMPRESSION: CT of the head: Hyperdense lesion in the posterior aspect of the right temporal lobe better visualized than  on prior exam. These changes are suspicious for underlying cavernoma. MRI of the head may be helpful for further evaluation. Area of decreased attenuation in the superior aspect of the right cerebellum consistent with subacute ischemia. This was not present on the prior CT examination. This could also be evaluated with MRI. CT of the cervical spine: Multilevel degenerative change without acute abnormality. Sclerotic foci consistent with metastatic disease. Electronically Signed   By: Alcide Clever M.D.   On: 03/13/2023 23:21   CT Cervical Spine Wo Contrast  Result Date: 03/13/2023 CLINICAL DATA:  Recent seizure activity with headaches and neck pain, initial encounter EXAM: CT HEAD WITHOUT CONTRAST CT CERVICAL SPINE WITHOUT CONTRAST TECHNIQUE: Multidetector CT imaging of the head and cervical spine was performed following the standard protocol without intravenous contrast. Multiplanar CT image reconstructions of the cervical spine were also generated. RADIATION DOSE REDUCTION: This exam was performed according to the departmental dose-optimization program which includes automated exposure control, adjustment of the mA and/or kV according to patient size and/or use of iterative reconstruction technique. COMPARISON:  02/23/2019 FINDINGS: CT HEAD FINDINGS Brain: No acute hemorrhage is identified. In the posterior right temporal lobe best seen on axial image number 18 of series 4 there is a 1.1 cm mildly hyperdense lesion identified. No surrounding edema is seen. No other focal mass lesion is noted. Geographic area of decreased attenuation is noted with right superior cerebellum best seen on axial image number 12 of series 4. This is suspicious for subacute ischemia. This was not present on the prior exam. Vascular: No hyperdense vessel or unexpected calcification. Skull: Normal. Negative for fracture or focal lesion. Sinuses/Orbits: No acute finding. Other: None. CT CERVICAL SPINE FINDINGS Alignment: Within normal  limits. Skull base and vertebrae: 7 cervical segments are well visualized. Vertebral body height is well maintained. Multilevel osteophytic change and facet hypertrophic changes are noted. No acute fracture or acute facet abnormality is noted. Sclerotic foci are noted in the anterior aspect of the T5 and T6 vertebral body suspicious for bony metastatic disease. Soft tissues and spinal canal: Surrounding soft tissue structures are within normal limits. Right chest wall port is seen. Upper chest: Emphysematous changes are noted. No other focal abnormality is noted. Other: None IMPRESSION: CT of the head: Hyperdense lesion in the posterior  aspect of the right temporal lobe better visualized than on prior exam. These changes are suspicious for underlying cavernoma. MRI of the head may be helpful for further evaluation. Area of decreased attenuation in the superior aspect of the right cerebellum consistent with subacute ischemia. This was not present on the prior CT examination. This could also be evaluated with MRI. CT of the cervical spine: Multilevel degenerative change without acute abnormality. Sclerotic foci consistent with metastatic disease. Electronically Signed   By: Alcide Clever M.D.   On: 03/13/2023 23:21   DG Chest Portable 1 View  Result Date: 03/13/2023 CLINICAL DATA:  Shortness of breath EXAM: PORTABLE CHEST 1 VIEW COMPARISON:  02/23/2023, PET CT 11/04/2022 FINDINGS: Right-sided central venous port tip at the proximal right atrium. Borderline cardiomegaly. No acute airspace disease or pleural effusion. Mild diffuse bronchitic changes. No pneumothorax IMPRESSION: Mild diffuse bronchitic changes. Electronically Signed   By: Jasmine Pang M.D.   On: 03/13/2023 22:29     Assessment and Plan: #Seizure-like Activity Recent admission 02/23/23 for seizures. CT Head in ED shows a hyperdense lesion of (R) temporal lobe suspicious for underlying cavernoma. Prior seizures thought to be provoked by  hyponatremia.  - Continue Keppra per neurology - PRN Lorazepam - Correct Sodium as below - Seizure precautions - Fall precautions - EEG per neuro - Neurology consulted by ED, appreciate their recommendations  #Hyponatremia Na 127 initially in ED, improved to 133 this morning. 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 - Replete PRN  #Hypokalemia K 3.3 - 20 mEQ K, recheck with AM labs  #Aspiration Pneumonia #Chronic Respiratory Failure with Hypoxia secondary to Chronic Obstructive Lung Disease  Wears 3L O2 via nasal cannula at baseline. CT Chest with evidence of aspiration. Patient is afebrile with no leukocytosis. Had aspiration pneumonia during 02/23/23 admission and treated with Unasyn. - Unasyn per pharmacy  - Follow Up on blood cultures drawn in ED, Urine Strep pneumoniae and Legionella - Mucinex 1200 mg BID - Nebulizers PRN - Continue supplemental O2 - Aspiration precautions - Pulmonary toilet  #Hypertension BP 170/110 this morning - Resume home meds once more alert - PRN hydralazine  #Schizophrenia Sees A. Mengistu NP with [facility] outpatient for Psych medication management - Hold home Prolixin and Cogentin - Consult Psych for input on Prolixin and Cogentin contributing to hyponatremia and seizures  VTE prophylaxis: Lovenox GI prophylaxis: Protonix Diet: NPO pending swallow evaluation Access : PIV Lines: NONE Code Status: DNR HCDM: Sister Garen Lah is legal guardian Telemetry: x24 hrs, acute neurological event Disposition: Admit to Progressive  Advance Care Planning: Code Status: DNR, confirmed with sister/legal guardian [name]  Consults: Neurology, Psych  Family Communication: Called and talked with Garen Lah. Discussed reason for admission, plan of care, and expected length of stay. Opportunity  provided for questions and questions answered to M(r)s Creddle's satisfaction.   Severity of Illness: The appropriate patient status for this patient is INPATIENT. Inpatient status is judged to be reasonable and necessary in order to provide the required intensity of service to ensure the patient's safety. The patient's presenting symptoms, physical exam findings, and initial radiographic and laboratory data in the context of their chronic comorbidities is felt to place them at high risk for further clinical deterioration. Furthermore, it is not anticipated that the patient will be medically stable for  discharge from the hospital within 2 midnights of admission.   * I certify that at the point of admission it is my clinical judgment that the patient will require inpatient hospital care spanning beyond 2 midnights from the point of admission due to high intensity of service, high risk for further deterioration and high frequency of surveillance required.*   To reach the provider On-Call:   7AM- 7PM see care teams to locate the attending and reach out to them via [email or web address] Password: [email or web address] 7PM-7AM contact night-coverage If you still have difficulty reaching the appropriate provider, please page the [facility] (Director on Call) for [facility] on amion for assistance  This document was prepared using Conservation officer, historic buildings and may include unintentional dictation errors.  Bishop Limbo FNP-BC, PMHNP-BC Nurse Practitioner [facility]

## 2023-03-14 NOTE — Care Management Obs Status (Signed)
MEDICARE OBSERVATION STATUS NOTIFICATION   Patient Details  Name: Trevor Montgomery MRN: [id number] Date of Birth: Mar 27, 1961   Medicare Observation Status Notification Given:  Yes    Kingsley Plan, RN 03/14/2023, 3:22 PM

## 2023-03-14 NOTE — ED Notes (Signed)
called to carelink per MD Wong/[email or web address]

## 2023-03-14 NOTE — ED Notes (Signed)
EMTALA reviewed by charge RN 

## 2023-03-14 NOTE — ED Notes (Signed)
Consent to transfer to [facility] obtained- signed by pts sister and POA, [name]. Witnessed by this Charity fundraiser.

## 2023-03-14 NOTE — Evaluation (Signed)
Physical Therapy Evaluation Patient Details Name: [facility] MRN: [id number] DOB: 1961-05-11 Today's Date: 03/14/2023  History of Present Illness  62 y.o. male presents to [facility] hospital on 03/13/2023 with concern for possible seizures. 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 PMH includes metastatic lung cancer, HTN, HLD, COPD, seizures.  Clinical Impression  Pt presents to PT with deficits in gait, balance, processing, safety awareness. Pt with 2 posterior losses of balance with initial transfers, slowly leaning backward and then sitting back onto bed. Pt benefits from BUE support of RW at this time to improve stability, however when asked at the end of session he feels his typical cane would be supportive enough. Pt will benefit from frequent mobilization in an effort to improve balance and gait quality. PT will assess use of rollator vs quad cane next session in an effort to assess safest DME to utilize at the time of discharge.       Recommendations for follow up therapy are one component of a multi-disciplinary discharge planning process, led by the attending physician.  Recommendations may be updated based on patient status, additional functional criteria and insurance authorization.  Follow Up Recommendations       Assistance Recommended at Discharge Frequent or constant Supervision/Assistance  Patient can return home with the following  A little help with walking and/or transfers;A little help with bathing/dressing/bathroom;Assistance with cooking/housework;Assistance with feeding;Direct supervision/assist for medications management;Direct supervision/assist for financial management;Assist for transportation;Help with stairs or ramp for entrance    Equipment Recommendations None recommended by PT (owns rollator, anticipate this device to provide sufficient stability)  Recommendations for Other Services       Functional Status Assessment Patient has had a  recent decline in their functional status and demonstrates the ability to make significant improvements in function in a reasonable and predictable amount of time.     Precautions / Restrictions Precautions Precautions: Fall Precaution Comments: seizures Restrictions Weight Bearing Restrictions: No      Mobility  Bed Mobility Overal bed mobility: Needs Assistance Bed Mobility: Supine to Sit, Sit to Supine     Supine to sit: Supervision Sit to supine: Supervision        Transfers Overall transfer level: Needs assistance Equipment used: Rolling walker (2 wheels), None Transfers: Sit to/from Stand Sit to Stand: Min assist           General transfer comment: posterior loss of balance back onto bed with impulsive stand without DME and with initial stand with RW    Ambulation/Gait Ambulation/Gait assistance: Min guard Gait Distance (Feet): 80 Feet Assistive device: Rolling walker (2 wheels) Gait Pattern/deviations: Step-through pattern Gait velocity: reduced Gait velocity interpretation: <1.8 ft/sec, indicate of risk for recurrent falls   General Gait Details: pt bumps into one object on left in hallway but is able to attend to left side frequently during session  Stairs            Wheelchair Mobility    Modified Rankin (Stroke Patients Only)       Balance Overall balance assessment: Needs assistance Sitting-balance support: No upper extremity supported, Feet supported Sitting balance-Leahy Scale: Good     Standing balance support: Single extremity supported, Bilateral upper extremity supported, Reliant on assistive device for balance Standing balance-Leahy Scale: Poor                               Pertinent Vitals/Pain Pain Assessment Pain Assessment:  No/denies pain    Home Living Family/patient expects to be discharged to:: Private residence Living Arrangements: Other relatives (sister and nephew) Available Help at Discharge:  Family;Available 24 hours/day Type of Home: House Home Access: Stairs to enter Entrance Stairs-Rails: None Entrance Stairs-Number of Steps: 3 + 1   Home Layout: One level Home Equipment: Rollator (4 wheels);Shower seat;Grab bars - tub/shower;Cane - quad Additional Comments: history obtained from recent prior admission, pt confirming most information    Prior Function Prior Level of Function : Independent/Modified Independent             Mobility Comments: ambulates with quad cane ADLs Comments: Ind with ADLs     Hand Dominance   Dominant Hand: Right    Extremity/Trunk Assessment   Upper Extremity Assessment Upper Extremity Assessment: Overall WFL for tasks assessed    Lower Extremity Assessment Lower Extremity Assessment: Generalized weakness    Cervical / Trunk Assessment Cervical / Trunk Assessment: Kyphotic  Communication   Communication: No difficulties  Cognition Arousal/Alertness: Awake/alert Behavior During Therapy: WFL for tasks assessed/performed Overall Cognitive Status: Impaired/Different from baseline Area of Impairment: Problem solving                             Problem Solving: Slow processing          General Comments General comments (skin integrity, edema, etc.): VSS on 3L [place]    Exercises     Assessment/Plan    PT Assessment Patient needs continued PT services  PT Problem List Decreased strength;Decreased activity tolerance;Decreased balance;Decreased mobility;Decreased knowledge of use of DME;Decreased cognition;Decreased safety awareness;Decreased knowledge of precautions       PT Treatment Interventions DME instruction;Gait training;Stair training;Functional mobility training;Therapeutic activities;Therapeutic exercise;Balance training;Neuromuscular re-education;Patient/family education    PT Goals (Current goals can be found in the Care Plan section)  Acute Rehab PT Goals Patient Stated Goal: to return home PT Goal  Formulation: With patient Time For Goal Achievement: 03/28/23 Potential to Achieve Goals: Fair    Frequency Min 3X/week     Co-evaluation               AM-PAC PT "6 Clicks" Mobility  Outcome Measure Help needed turning from your back to your side while in a flat bed without using bedrails?: A Little Help needed moving from lying on your back to sitting on the side of a flat bed without using bedrails?: A Little Help needed moving to and from a bed to a chair (including a wheelchair)?: A Little Help needed standing up from a chair using your arms (e.g., wheelchair or bedside chair)?: A Little Help needed to walk in hospital room?: A Little Help needed climbing 3-5 steps with a railing? : A Lot 6 Click Score: 17    End of Session Equipment Utilized During Treatment: Gait belt Activity Tolerance: Patient tolerated treatment well Patient left: in bed;with call bell/phone within reach;with bed alarm set Nurse Communication: Mobility status PT Visit Diagnosis: Other abnormalities of gait and mobility (R26.89);Muscle weakness (generalized) (M62.81)    Time: 1610-9604 PT Time Calculation (min) (ACUTE ONLY): 24 min   Charges:   PT Evaluation $PT Eval Low Complexity: 1 Low          Arlyss Gandy, PT, DPT Acute Rehabilitation Office [phone]   Arlyss Gandy 03/14/2023, 2:34 PM

## 2023-03-14 NOTE — Consult Note (Signed)
Curbside consult for [facility], a 62 y.o. male with a history of chronic schizophrenia being treated with Prolixin and Cogentin for many years.  Concern for hyponatremia and medication decreasing seizure threshold.  Primary team has been in contact with patient's sister, who is patient's guardian.  Sister confirms that these medications have been longstanding.  Hyponatremia has been a chronic problem.  Patient's schizophrenia has been well-controlled with his current regimen.  Schizophrenia has been well controlled with low dose Prolixin. I would not recommend making changes to his antipsychotic medication at this time.  I would recommend discontinuing scheduled Cogentin 0.5 mg twice daily.  This can be changed to an as needed medication for dystonia or EPS.  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  Small cell lung cancer can certainly contribute to worsening hyponatremia.  I would also consider evaluating for brain metastasis which may be worsening patient's seizures.  Mariel Craft, MD

## 2023-03-14 NOTE — Consult Note (Signed)
NEUROLOGY CONSULTATION NOTE   Date of service: March 14, 2023 Patient Name: [facility] MRN:  [id number] DOB:  11-23-1960 Reason for consult: "seizures" Requesting Provider: Carollee Herter, DO _ _ _   _ __   _ __ _ _  __ __   _ __   __ _  History of Present Illness  Trevor [facility] is a 62 y.o. male with PMH significant for metastatic lung cancer on palliative chemoradiotherapy, HTN, HLD, , COPD, provoked seizures in the setting of electrolyte imbalance and on Keppra 500 BID who was at his baseline cooking and then slumped over, poorly responsive. EMS called and he was noted to have R gaze deviation and jerking of upper extremities concerning for seizures. He was given versed enroute to [facility] ED.  He had persistent R gaze on arrival to [facility] ED and given Keppra 4grams and Ativan 4mg . This resulted in resolution of gaze deviation but he was significantly obtunded. 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 He was watched in the ED at [facility] for an hour and noted to favor R side and Right gaze preference and not crossing midline. He was transferred to [facility] for overnight urgent neurology evaluation and cEEG.  He is DNR/DNI.  He improved just prior to departure from [facility] and now moving all extremities spontaneously and on command.   ROS   Significantly bradyphrenic, unable to obtain ROS  Past History   Past Medical History:  Diagnosis Date   Anxiety    Arthritis    Emphysema of lung (HCC)    HTN (hypertension)    Hyperlipemia    Schizoaffective disorder, bipolar type (HCC)    Substance abuse (HCC)    Past Surgical History:  Procedure Laterality Date   FINGER SURGERY     IR IMAGING GUIDED PORT INSERTION  11/04/2022   VIDEO BRONCHOSCOPY WITH ENDOBRONCHIAL ULTRASOUND Left 10/11/2022   Procedure: VIDEO BRONCHOSCOPY WITH ENDOBRONCHIAL ULTRASOUND;  Surgeon: Raechel Chute, MD;  Location: [facility] ORS;  Service: Pulmonary;  Laterality: Left;   Family  History  Problem Relation Age of Onset   Stroke Father    Seizures Father    Stroke Sister    Cancer Maternal Grandmother    Social History   Socioeconomic History   Marital status: Single    Spouse name: Not on file   Number of children: Not on file   Years of education: Not on file   Highest education level: Not on file  Occupational History   Not on file  Tobacco Use   Smoking status: Former    Packs/day: 0.50    Years: 40.00    Additional pack years: 0.00    Total pack years: 20.00    Types: Cigarettes    Quit date: 10/15/2022    Years since quitting: 0.4   Smokeless tobacco: Former    Quit date: 10/15/2022  Vaping Use   Vaping Use: Not on file  Substance and Sexual Activity   Alcohol use: No   Drug use: No   Sexual activity: Not Currently  Other Topics Concern   Not on file  Social History Narrative   Not on file   Social Determinants of Health   Financial Resource Strain: Low Risk  (11/11/2022)   Overall Financial Resource Strain (CARDIA)    Difficulty of Paying Living Expenses: Not very hard  Food Insecurity: No Food Insecurity (02/24/2023)   Hunger Vital Sign    Worried  About Running Out of Food in the Last Year: Never true    Ran Out of Food in the Last Year: Never true  Transportation Needs: No Transportation Needs (02/24/2023)   PRAPARE - Administrator, Civil Service (Medical): No    Lack of Transportation (Non-Medical): No  Physical Activity: Inactive (11/11/2022)   Exercise Vital Sign    Days of Exercise per Week: 0 days    Minutes of Exercise per Session: 0 min  Stress: Not on file  Social Connections: Unknown (11/11/2022)   Social Connection and Isolation Panel [NHANES]    Frequency of Communication with Friends and Family: More than three times a week    Frequency of Social Gatherings with Friends and Family: More than three times a week    Attends Religious Services: Not on Marketing executive or Organizations: No     Attends Banker Meetings: Never    Marital Status: Never married   Allergies  Allergen Reactions   Fluphenazine     Other Reaction(s): Unknown    Medications   Medications Prior to Admission  Medication Sig Dispense Refill Last Dose   albuterol (VENTOLIN HFA) 108 (90 Base) MCG/ACT inhaler Inhale 2 puffs into the lungs every 6 (six) hours as needed for wheezing or shortness of breath.      benzonatate (TESSALON) 100 MG capsule Take 1 capsule (100 mg total) by mouth 4 (four) times daily as needed for cough. 20 capsule 0    benztropine (COGENTIN) 0.5 MG tablet Take 0.5 mg by mouth 2 (two) times daily.      bisacodyl (DULCOLAX) 5 MG EC tablet Take 1 tablet (5 mg total) by mouth daily as needed for moderate constipation. (Patient not taking: Reported on 02/28/2023) 30 tablet 0    docusate sodium (COLACE) 100 MG capsule Take 1 capsule (100 mg total) by mouth 2 (two) times daily as needed for mild constipation. (Patient not taking: Reported on 02/28/2023) 10 capsule 0    fluPHENAZine (PROLIXIN) 5 MG tablet Take 5 mg by mouth daily.      guaiFENesin (MUCINEX) 600 MG 12 hr tablet Take 1 tablet (600 mg total) by mouth 2 (two) times daily as needed for cough. 30 tablet 0    HYDROcodone-acetaminophen (NORCO/VICODIN) 5-325 MG tablet Take 1-2 tablets by mouth every 6 (six) hours as needed for moderate pain. (Patient not taking: Reported on 02/23/2023) 30 tablet 0    ipratropium-albuterol (DUONEB) 0.5-2.5 (3) MG/3ML SOLN Take 3 mLs by nebulization every 4 (four) hours as needed. 360 mL 0    levETIRAcetam (KEPPRA) 500 MG tablet Take 1 tablet (500 mg total) by mouth 2 (two) times daily. 60 tablet 3    lidocaine-prilocaine (EMLA) cream Apply to affected area once 30 g 3    lisinopril (ZESTRIL) 10 MG tablet Take 1 tablet (10 mg total) by mouth daily. 30 tablet 3    meloxicam (MOBIC) 15 MG tablet Take 15 mg by mouth daily.      menthol-cetylpyridinium (CEPACOL) 3 MG lozenge Take 1 lozenge (3 mg total)  by mouth as needed for sore throat. 30 tablet 0    Mouthwashes (MOUTH RINSE) LIQD solution 15 mLs by Mouth Rinse route as needed (oral care). 118 mL 0    Multiple Vitamin (MULTI-VITAMIN) tablet Take 1 tablet by mouth daily.      potassium chloride SA (KLOR-CON M) 20 MEQ tablet Take 1 tablet (20 mEq total) by mouth 2 (two) times  daily. 60 tablet 0    propranolol (INDERAL) 10 MG tablet Take 0.5 tablets (5 mg total) by mouth 2 (two) times daily. 60 tablet 0    simvastatin (ZOCOR) 20 MG tablet Take 20 mg by mouth at bedtime.      sodium chloride 1 g tablet Take 1 tablet (1 g total) by mouth 3 (three) times daily. 30 tablet 2    Vitamin E 400 units TABS Take 400 Units by mouth daily.        Vitals   There were no vitals filed for this visit.   There is no height or weight on file to calculate BMI.  Physical Exam   General: Laying comfortably in bed; in no acute distress.  HENT: Normal oropharynx and mucosa. Normal external appearance of ears and nose.  Neck: Supple, no pain or tenderness  CV: No JVD. No peripheral edema.  Pulmonary: Symmetric Chest rise. Normal respiratory effort.  Abdomen: Soft to touch, non-tender.  Ext: No cyanosis, edema, or deformity  Skin: No rash. Normal palpation of skin.   Musculoskeletal: Normal digits and nails by inspection. No clubbing.   Neurologic Examination  Mental status/Cognition: drowsy and slowed, oriented to self, place, month and year, good attention.  Speech/language: Non fluent, dysarthric speech, comprehension intact, object naming intact, repetition intact.  Cranial nerves:   CN II Pupils equal and reactive to [place], no VF deficits    CN III,IV,VI EOM intact, no gaze preference or deviation, no nystagmus    CN V normal sensation in V1, V2, and V3 segments bilaterally    CN VII no asymmetry, no nasolabial fold flattening    CN VIII normal hearing to speech    CN IX & X normal palatal elevation, no uvular deviation    CN XI 5/5 head turn and  5/5 shoulder shrug bilaterally    CN XII midline tongue protrusion    Motor:  Muscle bulk: poor. Moves all extremities spontaneously and antigravity to commands. Unable to do detailed strength testing due to poor participation.  Sensation:  Flis touch Intact throughout   Pin prick    Temperature    Vibration   Proprioception    Coordination/Complex Motor:  - Finger to Nose intact BL - gait: deferred for patient safety.  Labs   CBC:  Recent Labs  Lab 03/13/23 2201  WBC 6.2  NEUTROABS 3.7  HGB 10.7*  HCT 32.6*  MCV 91.6  PLT 265    Basic Metabolic Panel:  Lab Results  Component Value Date   NA 127 (L) 03/13/2023   K 3.8 03/13/2023   CO2 28 03/13/2023   GLUCOSE 129 (H) 03/13/2023   BUN 7 03/13/2023   CREATININE 0.57 (L) 03/13/2023   CALCIUM 8.3 (L) 03/13/2023   GFRNONAA >60 03/13/2023   Lipid Panel: No results found for: "LDLCALC" HgbA1c: No results found for: "HGBA1C" Urine Drug Screen: No results found for: "LABOPIA", "COCAINSCRNUR", "LABBENZ", "AMPHETMU", "THCU", "LABBARB"  Alcohol Level No results found for: "ETH"  CT Head without contrast(Personally reviewed): ? R posterior temporal lobe hyperdense lesion concerning for Cavernoma.  MRI Brain(Personally reviewed): Unable to get 2/2 severe kyphosis  rEEG:  pending  Impression   Kaedon Fanelli is a 62 y.o. male with PMH significant for metastatic lung cancer on palliative chemoradiotherapy, HTN, HLD, , COPD, provoked seizures in the setting of electrolyte imbalance and on Keppra 500 BID who was at his baseline cooking and then had seizures with R gaze deviation. Was transferred to [facility] overnight  for concern for persistent seizure with favoring R side and R gaze preference to get overnight LTM EEG. He was loaded with 4G Keppra and Ativan 4mg  prior to transfer.  He is significantly improved however and is now slow/drowy but follows commands in all extremities with no focal deficit. Low suspicion for persistent  seizures.  Recommendations  - increase Keppra to 1000mg  BID - monitor overnight for seizure clustering. - routine EEG. ______________________________________________________________________   Thank you for the opportunity to take part in the care of this patient. If you have any further questions, please contact the neurology consultation attending.  Signed,  Erick Blinks [facility] _ _ _   _ __   _ __ _ _  __ __   _ __   __ _

## 2023-03-14 NOTE — ED Provider Notes (Signed)
Patient received in signout by previous provider.  Pending transfer to [facility] for continuous EEG, suspected seizure, not back to baseline.  Patient continues to have right gaze deviation, altered mental status, unchanged exam during the majority of my shift, until approximately 1:30 AM when he had a slight improvement, now speaking words though nonsensical, reading the sign in front of his bed repeatedly stating " fall risk", the intermittent shaking of the right upper extremity has now stopped, and he does briefly gaze to the left side where his sister is at bedside, which are all improvements from prior.  Still very far from his baseline however.  Discussed with [facility] hospitalist accepted in transfer, now awaiting bed availability and transport.   Pilar Jarvis, MD 03/14/23 [phone]

## 2023-03-14 NOTE — TOC Initial Note (Addendum)
Transition of Care [facility]) - Initial/Assessment Note    Patient Details  Name: Rodrigues Urbanek MRN: [id number] Date of Birth: 11-08-1960  [facility] CM/SW Contact:    Kingsley Plan, RN Phone Number: 03/14/2023, 3:26 PM  Clinical Narrative:                  Spoke to patient's sister Garen Lah via phone. Patient lives with [name] . PT and SP recommending HHPT and HHSP.   Per chart patient was arranged home health services with [facility] 02/25/23. [name] states they currently do not have have home health services and she has no preference in agency. NCM called Morrie Sheldon with [facility]. Morrie Sheldon checking and will call NCM back.   PT recommending Rollator for home, however patient told PT he has one already. NCM asked [name] . [name] states patient uses her Rollator . Will order patient a rollator so they don't have to share . [name] in agreement ordered with Beckie Salts with [facility]   Morrie Sheldon with[facility] called back . Patient currently not active with them and they cannot accept due to staffing.   Angie with [facility] reviewing referral   Angie with [facility] accepted referral .   Expected Discharge Plan: Home w Home Health Services Barriers to Discharge: Continued Medical Work up   Patient Goals and CMS Choice Patient states their goals for this hospitalization and ongoing recovery are:: to return to home CMS Medicare.gov Compare Post Acute Care list provided to::  (sister Garen Lah spoke to via phone)        Expected Discharge Plan and Services   Discharge Planning Services: CM Consult Post Acute Care Choice: Home Health Living arrangements for the past 2 months: Single Family Home                 DME Arranged: Walker rolling with seat         HH Arranged: PT, Speech Therapy          Prior Living Arrangements/Services Living arrangements for the past 2 months: Single Family Home Lives with:: Siblings Patient language and need for interpreter reviewed::  Yes Do you feel safe going back to the place where you live?: Yes      Need for Family Participation in Patient Care: Yes (Comment) Care giver support system in place?: Yes (comment)   Criminal Activity/Legal Involvement Pertinent to Current Situation/Hospitalization: No - Comment as needed  Activities of Daily Living      Permission Sought/Granted      Share Information with NAME: sister [name]           Emotional Assessment Appearance:: Appears stated age       Alcohol / Substance Use: Not Applicable Psych Involvement: No (comment)  Admission diagnosis:  SEIZURES Patient Active Problem List   Diagnosis Date Noted   Seizure (HCC) 02/23/2023   Aspiration pneumonia (HCC) 02/23/2023   Lactic acidosis 02/23/2023   Chronic obstructive lung disease (HCC) 02/23/2023   Chronic hypoxemic respiratory failure (HCC) 12/11/2022   Thrombocytopenia (HCC) 11/13/2022   Hypokalemia 11/13/2022   Syncope and collapse 11/12/2022   Metastatic lung cancer (metastasis from lung to other site) [facility]) 10/29/2022   Palliative care encounter 10/16/2022   Sepsis due to pneumonia (HCC) 10/11/2022   Lung mass 10/11/2022   Hypertension 10/10/2022   Tobacco abuse 10/10/2022   Schizophrenia (HCC) 10/10/2022   Acute on chronic respiratory failure with hypoxia (HCC) 10/10/2022   Cough 10/10/2022   Hyponatremia 10/10/2022   CAP (community  acquired pneumonia) 10/10/2022   Centrilobular emphysema (HCC) 07/30/2018   PCP:  Barbette Reichmann, MD Pharmacy:   [facility] - [place], [place] - [address] [place] [address] Phone: [phone] Fax: [phone]     Social Determinants of Health (SDOH) Social History: SDOH Screenings   Food Insecurity: No Food Insecurity (02/24/2023)  Housing: Low Risk  (02/24/2023)  Transportation Needs: No Transportation Needs (02/24/2023)  Utilities: Not At Risk (02/24/2023)  Alcohol Screen: Low Risk  (11/11/2022)  Depression (PHQ2-9): Low Risk   (11/11/2022)  Financial Resource Strain: Low Risk  (11/11/2022)  Physical Activity: Inactive (11/11/2022)  Social Connections: Unknown (11/11/2022)  Tobacco Use: Medium Risk (03/14/2023)   SDOH Interventions:     Readmission Risk Interventions     No data to display

## 2023-03-14 NOTE — Progress Notes (Signed)
Speech Language Pathology Treatment: Dysphagia  Patient Details Name: Trevor Montgomery MRN: [id number] DOB: December 23, 1960 Today's Date: 03/14/2023 Time: 4098-1191 SLP Time Calculation (min) (ACUTE ONLY): 8 min  Assessment / Plan / Recommendation Clinical Impression  Pt was seen for dysphagia treatment after pt's RN advised that pt's EEG will be continued until 6/22; the MBS therefore cannot be completed today. Pt exhibited coughing with consecutive swallows of nectar thick liquids, but tolerated individual boluses of nectar thick liquids via straw without overt s/s of aspiration. Pt was educated regarding swallowing precautions and the updated plan of care; understanding was verbalized. A dysphagia 3 diet with nectar thick liquids is recommended. Pt's MBS will be deferred at this time considering the EEG. SLP will follow to ensure diet tolerance, to re-assess swallow function, and for instrumental assessment as clinically indicated.    HPI HPI: Pt is a 62 y.o. male who presented secondary to becoming poorly responsive. EMS noted pt to have right gaze deviation and jerking of upper extremities concerning for seizures; pt loaded with Keppra and Ativan. CT head 6/20: Hyperdense lesion in the posterior aspect of the right temporal lobe suspicious for underlying cavernoma. MRI pending at time of evaluation. CT chest 6/20: Likely debris within the trachea and both mainstem bronchi suggesting aspiration. This was also noted on 6/2. PMH: metastatic lung cancer on palliative chemoradiotherapy, HTN, HLD, COPD, provoked seizures in the setting of electrolyte imbalance and on Keppra 500 BID who was at his baseline. BSE and treatment 6/2 and 6/3 and a regular texture diet with thin liquids was ultimately recommended      SLP Plan  Continue with current plan of care      Recommendations for follow up therapy are one component of a multi-disciplinary discharge planning process, led by the attending physician.   Recommendations may be updated based on patient status, additional functional criteria and insurance authorization.    Recommendations  Diet recommendations: Dysphagia 3 (mechanical soft);Nectar-thick liquid Liquids provided via: Straw;Cup Medication Administration: Crushed with puree (or whole with puree) Supervision: Full supervision/cueing for compensatory strategies Compensations: Slow rate;Small sips/bites (avoid consecutive swallows) Postural Changes and/or Swallow Maneuvers: Seated upright 90 degrees                  Oral care BID     Dysphagia, unspecified (R13.10)     Continue with current plan of care    Trevor Montgomery I. Trevor Clock, MS, CCC-SLP Neuro Diagnostic Specialist  Acute Rehabilitation Services Office number: [phone]  Trevor Montgomery  03/14/2023, 11:20 AM

## 2023-03-14 NOTE — Progress Notes (Signed)
Pharmacy Antibiotic Note  Trevor Montgomery is a 62 y.o. male with PMH significant for metastatic lung cancer on palliative chemoradiotherapy, concerning for admitted on 03/14/2023 with seizure activity now with concerns for aspiration pneumonia.  Pharmacy has been consulted for Unasyn dosing.  Plan: Unasyn 3g IV Q6h Trend WBC, fever, renal function F/u cultures, clinical progress, levels as indicated De-escalate when able     Temp (24hrs), Avg:98.5 F (36.9 C), Min:97.7 F (36.5 C), Max:99.3 F (37.4 C)  Recent Labs  Lab 03/13/23 2201 03/14/23 0636  WBC 6.2 6.2  CREATININE 0.57* 0.54*  LATICACIDVEN 1.1  --     Estimated Creatinine Clearance: 112.6 mL/min (A) (by C-G formula based on SCr of 0.54 mg/dL (L)).    Allergies  Allergen Reactions   Fluphenazine     Other Reaction(s): Unknown    Microbiology results: 6/20 BCx: pending  Thank you for allowing pharmacy to be a part of this patient's care.  Carrington Clamp 03/14/2023 10:16 AM

## 2023-03-14 NOTE — Progress Notes (Signed)
LTM EEG running - no initial skin breakdown - push button tested. 

## 2023-03-14 NOTE — ED Notes (Signed)
Report given to Sam, RN for 3 west room 10

## 2023-03-14 NOTE — ED Notes (Signed)
Pt transported to [facility] via [facility], no obvious distress noted, pt A&O x3.

## 2023-03-14 NOTE — Progress Notes (Addendum)
Neurology Progress Note  Subjective: - No acute complaints  Exam: Current vital signs: BP 139/87 (BP Location: Left Arm)   Pulse 86   Temp 97.6 F (36.4 C) (Oral)   Resp 20   SpO2 100%  Vital signs in last 24 hours: Temp:  [97.6 F (36.4 C)-99.3 F (37.4 C)] 97.6 F (36.4 C) (06/21 1626) Pulse Rate:  [83-103] 86 (06/21 1626) Resp:  [18-27] 20 (06/21 1626) BP: (129-200)/(85-121) 139/87 (06/21 1626) SpO2:  [95 %-100 %] 100 % (06/21 1626) Weight:  [96.8 kg] 96.8 kg (06/20 2210)   Gen: In bed, comfortable, EEG in place Resp: non-labored breathing, no grossly audible wheezing Cardiac: Perfusing extremities well  Abd: soft, nt  Neuro: MS: Slightly sleepy but awakens easily, conversant, oriented to hospital but unclear whether he is at [facility] or [facility], oriented to age and month as well as to the fact that he came for seizure CN: Pupils equal round reactive to [place], tracks examiner well.  Face symmetric, tongue midline.  Perhaps a very mild left gaze preference Motor: Using all 4 extremities grossly equally  Pertinent Labs:  Basic Metabolic Panel: Recent Labs  Lab 03/13/23 2201 03/14/23 0636  NA 127* 133*  K 3.8 3.4*  CL 91* 91*  CO2 28 27  GLUCOSE 129* 102*  BUN 7 <5*  CREATININE 0.57* 0.54*  CALCIUM 8.3* 9.3  MG 1.3*  --     CBC: Recent Labs  Lab 03/13/23 2201 03/14/23 0636  WBC 6.2 6.2  NEUTROABS 3.7  --   HGB 10.7* 10.9*  HCT 32.6* 32.6*  MCV 91.6 87.6  PLT 265 293    Coagulation Studies: No results for input(s): "LABPROT", "INR" in the last 72 hours.    Impression: Breakthrough seizure potentially in the setting of hypomagnesemia and hyponatremia.  New hypodensity on head CT this admission is also concerning for possible metastatic disease though it would not explain right-sided weakness described given it is on the right side of the brain.   Recommendations:  # Concern for breakthrough seizure - Continue Keppra 1000 mg BID (increased from  home dose)  Estimated Creatinine Clearance: 112.6 mL/min (A) (by C-G formula based on SCr of 0.54 mg/dL (L)).  - Discontinue LTM EEG given he is essentially at baseline and it has been negative to date on preliminary read by epileptologist - Appreciate workup of etiology of electrolyte derangements per primary team, noting that he will be at continued risk for breakthrough seizures if he has recurrent electrolyte derangements in the future  # Hypodensity on head CT  - MRI brain with and without contrast if able, noting that his significant kyphosis limited ability to do this at [facility] and he may be too kyphotic to have exam here - If MRI brain cannot be obtained will obtain head CT with contrast; outpatient may benefit from PET/CT including brain PET   Neurology will follow along  [name] MD-PhD [facility] [phone]   Same day, no charge note

## 2023-03-15 ENCOUNTER — Observation Stay (HOSPITAL_COMMUNITY): Payer: Medicare Other

## 2023-03-15 ENCOUNTER — Encounter (HOSPITAL_COMMUNITY): Payer: Self-pay | Admitting: Internal Medicine

## 2023-03-15 DIAGNOSIS — Z6831 Body mass index (BMI) 31.0-31.9, adult: Secondary | ICD-10-CM | POA: Diagnosis not present

## 2023-03-15 DIAGNOSIS — Z515 Encounter for palliative care: Secondary | ICD-10-CM | POA: Diagnosis not present

## 2023-03-15 DIAGNOSIS — F250 Schizoaffective disorder, bipolar type: Secondary | ICD-10-CM | POA: Diagnosis present

## 2023-03-15 DIAGNOSIS — G40909 Epilepsy, unspecified, not intractable, without status epilepticus: Secondary | ICD-10-CM | POA: Diagnosis present

## 2023-03-15 DIAGNOSIS — R569 Unspecified convulsions: Secondary | ICD-10-CM | POA: Diagnosis not present

## 2023-03-15 DIAGNOSIS — F419 Anxiety disorder, unspecified: Secondary | ICD-10-CM | POA: Diagnosis present

## 2023-03-15 DIAGNOSIS — G936 Cerebral edema: Secondary | ICD-10-CM | POA: Diagnosis present

## 2023-03-15 DIAGNOSIS — J9611 Chronic respiratory failure with hypoxia: Secondary | ICD-10-CM | POA: Diagnosis present

## 2023-03-15 DIAGNOSIS — R682 Dry mouth, unspecified: Secondary | ICD-10-CM | POA: Diagnosis present

## 2023-03-15 DIAGNOSIS — C7931 Secondary malignant neoplasm of brain: Secondary | ICD-10-CM | POA: Diagnosis present

## 2023-03-15 DIAGNOSIS — I10 Essential (primary) hypertension: Secondary | ICD-10-CM | POA: Diagnosis present

## 2023-03-15 DIAGNOSIS — E669 Obesity, unspecified: Secondary | ICD-10-CM | POA: Diagnosis present

## 2023-03-15 DIAGNOSIS — E871 Hypo-osmolality and hyponatremia: Secondary | ICD-10-CM | POA: Diagnosis not present

## 2023-03-15 DIAGNOSIS — G928 Other toxic encephalopathy: Secondary | ICD-10-CM | POA: Diagnosis not present

## 2023-03-15 DIAGNOSIS — E222 Syndrome of inappropriate secretion of antidiuretic hormone: Secondary | ICD-10-CM | POA: Diagnosis present

## 2023-03-15 DIAGNOSIS — F05 Delirium due to known physiological condition: Secondary | ICD-10-CM | POA: Diagnosis not present

## 2023-03-15 DIAGNOSIS — I639 Cerebral infarction, unspecified: Secondary | ICD-10-CM | POA: Diagnosis not present

## 2023-03-15 DIAGNOSIS — Z7189 Other specified counseling: Secondary | ICD-10-CM | POA: Diagnosis not present

## 2023-03-15 DIAGNOSIS — T380X5A Adverse effect of glucocorticoids and synthetic analogues, initial encounter: Secondary | ICD-10-CM | POA: Diagnosis not present

## 2023-03-15 DIAGNOSIS — F209 Schizophrenia, unspecified: Secondary | ICD-10-CM | POA: Diagnosis not present

## 2023-03-15 DIAGNOSIS — J690 Pneumonitis due to inhalation of food and vomit: Secondary | ICD-10-CM | POA: Diagnosis present

## 2023-03-15 DIAGNOSIS — T426X5A Adverse effect of other antiepileptic and sedative-hypnotic drugs, initial encounter: Secondary | ICD-10-CM | POA: Diagnosis present

## 2023-03-15 DIAGNOSIS — Z66 Do not resuscitate: Secondary | ICD-10-CM | POA: Diagnosis present

## 2023-03-15 DIAGNOSIS — C3490 Malignant neoplasm of unspecified part of unspecified bronchus or lung: Secondary | ICD-10-CM | POA: Diagnosis present

## 2023-03-15 DIAGNOSIS — E785 Hyperlipidemia, unspecified: Secondary | ICD-10-CM | POA: Diagnosis present

## 2023-03-15 DIAGNOSIS — J439 Emphysema, unspecified: Secondary | ICD-10-CM | POA: Diagnosis present

## 2023-03-15 DIAGNOSIS — E876 Hypokalemia: Secondary | ICD-10-CM | POA: Diagnosis present

## 2023-03-15 DIAGNOSIS — R45851 Suicidal ideations: Secondary | ICD-10-CM | POA: Diagnosis not present

## 2023-03-15 LAB — CBC
HCT: 33 % — ABNORMAL LOW (ref 39.0–52.0)
Hemoglobin: 11 g/dL — ABNORMAL LOW (ref 13.0–17.0)
MCH: 29.3 pg (ref 26.0–34.0)
MCHC: 33.3 g/dL (ref 30.0–36.0)
MCV: 88 fL (ref 80.0–100.0)
Platelets: 269 10*3/uL (ref 150–400)
RBC: 3.75 MIL/uL — ABNORMAL LOW (ref 4.22–5.81)
RDW: 13.3 % (ref 11.5–15.5)
WBC: 5.9 10*3/uL (ref 4.0–10.5)
nRBC: 0 % (ref 0.0–0.2)

## 2023-03-15 LAB — MAGNESIUM: Magnesium: 1.6 mg/dL — ABNORMAL LOW (ref 1.7–2.4)

## 2023-03-15 LAB — COMPREHENSIVE METABOLIC PANEL
ALT: 12 U/L (ref 0–44)
AST: 13 U/L — ABNORMAL LOW (ref 15–41)
Albumin: 3.3 g/dL — ABNORMAL LOW (ref 3.5–5.0)
Alkaline Phosphatase: 73 U/L (ref 38–126)
Anion gap: 8 (ref 5–15)
BUN: <5 mg/dL — ABNORMAL LOW (ref 6–20)
CO2: 27 mmol/L (ref 22–32)
Calcium: 8.9 mg/dL (ref 8.9–10.3)
Chloride: 98 mmol/L (ref 98–111)
Creatinine, Ser: 0.5 mg/dL — ABNORMAL LOW (ref 0.61–1.24)
GFR, Estimated: >60 mL/min (ref >60)
Glucose, Bld: 94 mg/dL (ref 70–99)
Potassium: 3.4 mmol/L — ABNORMAL LOW (ref 3.5–5.1)
Sodium: 133 mmol/L — ABNORMAL LOW (ref 135–145)
Total Bilirubin: 0.7 mg/dL (ref 0.3–1.2)
Total Protein: 5.9 g/dL — ABNORMAL LOW (ref 6.5–8.1)

## 2023-03-15 MED ORDER — ENOXAPARIN SODIUM 40 MG/0.4ML IJ SOSY
40.0000 mg | PREFILLED_SYRINGE | Freq: Every day | INTRAMUSCULAR | Status: DC
  Administered 2023-03-15 – 2023-03-23 (×9): 40 mg via SUBCUTANEOUS
  Filled 2023-03-15 (×9): qty 0.4

## 2023-03-15 MED ORDER — POTASSIUM CHLORIDE CRYS ER 10 MEQ PO TBCR: 40.0000 meq | EXTENDED_RELEASE_TABLET | Freq: Once | ORAL | Status: DC

## 2023-03-15 MED ORDER — DEXAMETHASONE SODIUM PHOSPHATE 4 MG/ML IJ SOLN
4.0000 mg | Freq: Every day | INTRAMUSCULAR | Status: DC
  Administered 2023-03-15 – 2023-03-16 (×2): 4 mg via INTRAVENOUS
  Filled 2023-03-15 (×2): qty 1

## 2023-03-15 MED ORDER — MAGNESIUM SULFATE 50 % IJ SOLN
6.0000 g | Freq: Once | INTRAVENOUS | Status: AC
  Administered 2023-03-15: 6 g via INTRAVENOUS
  Filled 2023-03-15: qty 12

## 2023-03-15 MED ORDER — IOHEXOL 350 MG/ML SOLN
75.0000 mL | Freq: Once | INTRAVENOUS | Status: AC | PRN
  Administered 2023-03-15: 75 mL via INTRAVENOUS

## 2023-03-15 MED ORDER — BENZTROPINE MESYLATE 0.5 MG PO TABS: 0.5000 mg | ORAL_TABLET | Freq: Two times a day (BID) | ORAL | Status: DC | PRN

## 2023-03-15 NOTE — Progress Notes (Signed)
PHARMACY - PHYSICIAN COMMUNICATION CRITICAL VALUE ALERT - BLOOD CULTURE IDENTIFICATION (BCID)  Trevor Montgomery is an 62 y.o. male who presented to [facility] on 03/14/2023 with a chief complaint of seizures  Assessment: 1/4 BCX with Gram positive rods  Name of physician (or Provider) Contacted: Janee Morn  Current antibiotics: Unasyn 3g IV q6h for aspiration PNA   Changes to prescribed antibiotics recommended:  None likely contaminant 1/4   No results found for this or any previous visit.  De Burrs 03/15/2023  7:49 AM

## 2023-03-15 NOTE — Plan of Care (Signed)

## 2023-03-15 NOTE — Plan of Care (Signed)
Patient too kyphotic for MRI CTA head/neck for possible stroke workup CTV to eval for possible metastatic disease / dural venous sinus thrombosis given malignancy history   Neurology will follow

## 2023-03-15 NOTE — Procedures (Signed)
Patient Name: Trevor Montgomery  MRN: [id number]  Epilepsy Attending: Charlsie Quest  Referring Physician/Provider: Erick Blinks, MD  Duration: 03/14/2023 9562 to 1333  Patient history: 62 yo M with breakthrough seizure getting eeg to evaluate for seizure.  Level of alertness: lethargic   AEDs during EEG study: LEV  Technical aspects: This EEG study was done with scalp electrodes positioned according to the 10-20 International system of electrode placement. Electrical activity was reviewed with band pass filter of 1-70Hz , sensitivity of 7 uV/mm, display speed of 75mm/sec with a 60Hz  notched filter applied as appropriate. EEG data were recorded continuously and digitally stored.  Video monitoring was available and reviewed as appropriate.  Description: EEG showed continuous generalized and maximal right frontal 3 to 6 Hz theta-delta slowing with overriding 13-15z beta activity. Sharp waves were also noted in right frontal region. Hyperventilation and photic stimulation were not performed.     ABNORMALITY -Sharp wave,right frontal region - Continuous slow, generalized and maximal right frontal  IMPRESSION: This study showed evidence of epileptogenicity and cortical dysfunction arising fromright frontal region. Additionally there is moderate diffuse encephalopathy, nonspecific etiology. No seizures were seen throughout the recording.  [name] Annabelle Harman

## 2023-03-15 NOTE — Progress Notes (Signed)
Neurology Progress Note  Subjective: - No acute complaints, sitting up in bed.  Denies headache or pain on assessment today.  Patient is unable to undergo MRI imaging due to his degree of kyphosis, CTA head and neck and CTV head completed.   Exam: Current vital signs: BP (!) 154/93 (BP Location: Left Arm) Comment: RN notified  Pulse 87   Temp 98 F (36.7 C) (Oral)   Resp 18   Ht 5\' 9"  (1.753 m)   SpO2 98%   BMI 31.51 kg/m  Vital signs in last 24 hours: Temp:  [97.6 F (36.4 C)-98.5 F (36.9 C)] 98 F (36.7 C) (06/22 1256) Pulse Rate:  [74-105] 87 (06/22 1256) Resp:  [18-20] 18 (06/22 1256) BP: (132-157)/(66-114) 154/93 (06/22 1256) SpO2:  [95 %-100 %] 98 % (06/22 1256)  Gen: Sitting up in bed comfortably, no acute distress Resp: non-labored breathing, no grossly audible wheezing, 2 L [place] in place Cardiac: Perfusing extremities well  Abd: soft, nt  Neuro: MS: Awake, alert. Patient does not answer orientation questions today.  With Rauda application of noxious stimuli bilaterally he states "don't hurt me" and when asked his name he does not answer.  As examiner is leaving the room minutes later, he states "Johnny" to answer his name.  Significant response delay. Does not follow commands. Does not answer further orientation questions.  On reassessment in the afternoon with attending at bedside, patient is conversant with more brisk responses. He is able to state his name and current place. He communicates that he is not in pain.  He follows commands throughout without difficulty with spontaneous upper and lower extremity movements. He does have some ongoing confusion noted during conversation but notes that his sister would like an update from providers.  CN: Pupils equal round reactive to [place], tracks examiner well.  Face symmetric resting and smiling, hearing is intact to voice. Phonation intact.  Motor: Maintains antigravity position of each extremity with passive elevation  throughout without unilateral weakness or asymmetry. On repeat examination, patient is able to elevate each extremity antigravity to command without unilateral weakness or noted asymmetry.   Pertinent Labs:  Basic Metabolic Panel: Recent Labs  Lab 03/13/23 2201 03/14/23 0636 03/14/23 0754 03/15/23 0442  NA 127* 133*  --  133*  K 3.8 3.4*  --  3.4*  CL 91* 91*  --  98  CO2 28 27  --  27  GLUCOSE 129* 102*  --  94  BUN 7 <5*  --  <5*  CREATININE 0.57* 0.54*  --  0.50*  CALCIUM 8.3* 9.3  --  8.9  MG 1.3*  --  1.7 1.6*    CBC: Recent Labs  Lab 03/13/23 2201 03/14/23 0636 03/15/23 0442  WBC 6.2 6.2 5.9  NEUTROABS 3.7  --   --   HGB 10.7* 10.9* 11.0*  HCT 32.6* 32.6* 33.0*  MCV 91.6 87.6 88.0  PLT 265 293 269    Coagulation Studies: No results for input(s): "LABPROT", "INR" in the last 72 hours.   EEG 03/14/2023: "This study showed evidence of epileptogenicity and cortical dysfunction arising fromright frontal region. Additionally there is moderate diffuse encephalopathy, nonspecific etiology. No seizures were seen throughout the recording."  Imaging reviewed:  CTA head and neck w+wo + CT venogram head 03/15/23: 1. Moderate to severe narrowing in a proximal left M2 branch. 2. No intracranial large vessel occlusion. 3. No hemodynamically significant stenosis in the neck. 4. No evidence of dural venous sinus thrombosis. 5. Redemonstrated  hypodense lesion in the lateral right frontal lobe, new compared to 02/23/2023. Redemonstrated hyperdense lesion in the posterior right temporal lobe with slight interval increase in surrounding vasogenic edema. There may be an additional contrast-enhancing lesion in the posterior left temporal lobe. Recommend brain MRI with and without contrast for further evaluation and to assess for the possibilty of intracranial metastatic disease. 6. Redemonstrated are multiple sclerotic regions in the cervical and visualized thoracic vertebral bodies,  unchanged from prior exam, concerning for metastatic disease. 7. See recent prior CT chest for intrathoracic findings. 8. Emphysema.  Impression: Breakthrough seizure potentially in the setting of hypomagnesemia and hyponatremia. Imaging concerning for metastatic disease more than stroke given history.   Recommendations:  # Concern for breakthrough seizure - Continue Keppra 1000 mg BID (increased from home dose)  Estimated Creatinine Clearance: 112.6 mL/min (A) (by C-G formula based on SCr of 0.5 mg/dL (L)).  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 Barbaraann Cao with neuro-oncology: will be discussed in tumor board, outpatient follow-up with Dr. Barbaraann Cao, repeat CT w/ contrast to monitor lesions on an outpatient basis  # Goals of care / social - Discussed with sister, she is concerned about her ability to care for him with ongoing delirium that may worsen due to progression of his disease - Appreciate further discussion by primary team  Neurology will sign off, please reach out if other questions or concerns arise  [name], AGACNP-BC [facility] Pager: [phone]  Attending Neurologist's note:  I personally saw this patient, gathering history, performing a full neurologic examination, reviewing relevant labs, personally reviewing relevant imaging including CTA CTV, and formulated the assessment and plan, adding the note above for completeness and clarity to accurately reflect my  thoughts  [name] MD-PhD [facility] [phone] Available 7 AM to 7 PM, outside these hours please contact Neurologist on call listed on AMION  >50 min spent in care of patient, majority at bedside / discussion w/ family

## 2023-03-15 NOTE — Progress Notes (Signed)
PROGRESS NOTE    Trevor Montgomery  OVF:[id number] DOB: 29-May-1961 DOA: 03/14/2023 PCP: Barbette Reichmann, MD    No chief complaint on file.   Brief Narrative:  Patient is a 62 year old gentleman history of metastatic lung cancer on palliative chemo radiation, hypertension hyperlipidemia COPD, history of provoked seizures in the setting of electrolyte imbalance on Keppra 500 twice daily noted at his baseline. Patient noted to be cooking and subsequently slumped over was poorly responsive with a rightward gaze. EMS called patient noted to have a right gaze deviation with jerking upper extremities concerning for seizure-like activity and given Versed on arrival to [facility] ED. On presentation to [facility] ED patient had persistent right gaze deviation received Keppra 4 g and Ativan 4 mg with resultant improvement with right gaze deviation however remains significantly obtunded. 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 Neurology consulted and patient transferred to [facility] for continuous EEG for further evaluation.    Assessment & Plan:   Principal Problem:   Seizure (HCC) Active Problems:   Aspiration pneumonia (HCC)   Hyponatremia   Schizophrenia (HCC)   Hypertension   Metastatic lung cancer (metastasis from lung to other site) (HCC)   Hypokalemia   Chronic obstructive lung disease (HCC)   Chronic hypoxemic respiratory failure (HCC)   Hypomagnesemia   Cerebrovascular accident (CVA) (HCC)  1.  Breakthrough seizures -Patient presented with breakthrough seizures, recent admission for seizures 02/23/2023. -CT head done with hyperdense lesion in the right temporal lobe suspicious for underlying cavernoma.  CT head also with concerns for superior aspect of right cerebellum concerning for subacute ischemia. -Patient noted with prior history of  seizures secondary to electrolyte abnormalities. -Continuous EEG being placed. -MRI ordered however canceled as unable to get due to significant kyphosis.  It is noted that during last hospitalization MRI was unable to be obtained as well due to significant kyphosis.  -CT angiogram head and neck ordered as well as CT venogram ordered per neurology. -Patient with no further seizure-like activity. -Continue current increased dose of Keppra 1000 mg twice daily. -Replete electrolytes. -Seizure precautions, fall precautions. -Neurology following and appreciate their input and recommendations.   2.  Hypomagnesemia/hypokalemia -Potassium at 3.4 this morning, magnesium at 1.6.  -Magnesium sulfate 6 g IV x 1. - KDur 40 mEq p.o. x 1.    3.  Hyponatremia -Sodium noted initially on presentation at 127 improved to 133 (03/14/2023) and currently at 133 today. 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 -Continue to hold home regimen salt tablets. -Saline lock IV fluids. -Repeat labs in the AM.    4.  Aspiration pneumonia/chronic respiratory failure with hypoxia secondary to COPD -Patient chronically on 3 L home O2 at baseline. -CT chest admission concern for aspiration pneumonia. -Sputum Gram stain and culture pending.  -Urine Legionella antigen pending, urine pneumococcus antigen pending.  -Continue Mucinex twice daily, nebulizer treatments, IV Unasyn, aspiration precautions.  -Patient seen by SLP.  -If continued improvement could likely transition to oral antibiotics in the next 24 to 48 hours.    5.  Hypertension -Continue home regimen lisinopril, propranolol.    6.  Schizophrenia -Continue home regimen Prolixin.   -Change home regimen Cogentin from twice daily to as needed per psych recommendations.   7.  Subacute ischemia -Noted on head CT in the cerebellar region. -  MRI head unable to be done due to significant kyphosis.. -It is noted  during prior hospitalization that MRI head was attempted however unable to be done due to patient's kyphosis. -As MRI unable to be done patient assessed by neurology who recommended and ordered CT angio head and neck for possible stroke workup, CT venogram to evaluate for possible metastatic disease/dural venous sinus thrombosis given malignant history.   -Per neurology.  8.  Metastatic lung cancer -Currently on palliative chemoradiation. -Outpatient follow-up with oncology.     DVT prophylaxis: Lovenox Code Status: DNR Family Communication: Updated patient.  No family at bedside. Disposition: TBD  Status is: Observation The patient remains OBS appropriate and will d/c before 2 midnights.   Consultants:  Neurology: Dr.Khaliqdina 03/14/2023 Psychiatry: Dr. Rebecca Eaton 03/14/2023  Procedures:  CT head 03/13/2023 CT C-spine 03/13/2023 CT chest 03/13/2023 Chest x-ray 03/13/2023 EEG 03/14/2023   Antimicrobials:  Anti-infectives (From admission, onward)    Start     Dose/Rate Route Frequency Ordered Stop   03/14/23 1130  Ampicillin-Sulbactam (UNASYN) 3 g in sodium chloride 0.9 % 100 mL IVPB        3 g 200 mL/hr over 30 Minutes Intravenous Every 6 hours 03/14/23 1034           Subjective: Patient laying in bed.  Alert.  Some dysarthric speech.  Denies any further seizure-like activity or decreased responsiveness.  No chest pain.  No shortness of breath.  No abdominal pain.  Tolerating current diet.  Objective: Vitals:   03/14/23 2354 03/15/23 0021 03/15/23 0412 03/15/23 0939  BP: 132/66  (!) 136/114 (!) 157/93  Pulse: 74  75 87  Resp: 18  18 20   Temp: 98.5 F (36.9 C)  97.8 F (36.6 C) 98.2 F (36.8 C)  TempSrc: Oral  Oral Oral  SpO2: 95%  95% 99%  Height:  5\' 9"  (1.753 m)      Intake/Output Summary (Last 24 hours) at 03/15/2023 1042 Last data filed at 03/15/2023 1000 Gross per 24 hour  Intake 1531.08 ml  Output 2900 ml  Net -1368.92 ml   There were no vitals filed for  this visit.  Examination:  General exam: Appears calm and comfortable.  Kyphosis. Respiratory system: Some coarse breath sounds in the bases otherwise clear.  No wheezing.  Fair air movement.  Speaking in full sentences.  Respiratory effort normal. Cardiovascular system: S1 & S2 heard, RRR. No JVD, murmurs, rubs, gallops or clicks. No pedal edema. Gastrointestinal system: Abdomen is nondistended, soft and nontender. No organomegaly or masses felt. Normal bowel sounds heard. Central nervous system: Alert and oriented.  Dysarthric speech.  Moving extremities spontaneously.  No focal neurological deficits. Extremities: Symmetric 5 x 5 power. Skin: No rashes, lesions or ulcers Psychiatry: Judgement and insight appear normal. Mood & affect appropriate.     Data Reviewed: I have personally reviewed following labs and imaging studies  CBC: Recent Labs  Lab 03/13/23 2201 03/14/23 0636 03/15/23 0442  WBC 6.2 6.2 5.9  NEUTROABS 3.7  --   --   HGB 10.7* 10.9* 11.0*  HCT 32.6* 32.6* 33.0*  MCV 91.6 87.6 88.0  PLT 265 293 269    Basic Metabolic Panel: Recent Labs  Lab 03/13/23 2201 03/14/23 0636 03/14/23 0754 03/15/23 0442  NA 127* 133*  --  133*  K 3.8 3.4*  --  3.4*  CL 91* 91*  --  98  CO2 28 27  --  27  GLUCOSE 129* 102*  --  94  BUN 7 <5*  --  <5*  CREATININE 0.57* 0.54*  --  0.50*  CALCIUM 8.3* 9.3  --  8.9  MG 1.3*  --  1.7 1.6*    GFR: Estimated Creatinine Clearance: 112.6 mL/min (A) (by C-G formula based on SCr of 0.5 mg/dL (L)).  Liver Function Tests: Recent Labs  Lab 03/13/23 2201 03/15/23 0442  AST 16 13*  ALT 12 12  ALKPHOS 76 73  BILITOT 0.5 0.7  PROT 6.6 5.9*  ALBUMIN 3.9 3.3*    CBG: No results for input(s): "GLUCAP" in the last 168 hours.   Recent Results (from the past 240 hour(s))  Blood culture (routine x 2)     Status: None (Preliminary result)   Collection Time: 03/13/23 10:01 PM   Specimen: BLOOD RIGHT ARM  Result Value Ref Range  Status   Specimen Description BLOOD RIGHT ARM  Final   Special Requests   Final    BOTTLES DRAWN AEROBIC AND ANAEROBIC Blood Culture adequate volume   Culture   Final    NO GROWTH 2 DAYS Performed at [facility], [address] [place], [place] [address]    Report Status PENDING  Incomplete  Blood culture (routine x 2)     Status: None (Preliminary result)   Collection Time: 03/13/23 10:01 PM   Specimen: BLOOD LEFT ARM  Result Value Ref Range Status   Specimen Description BLOOD LEFT ARM  Final   Special Requests   Final    BOTTLES DRAWN AEROBIC AND ANAEROBIC Blood Culture adequate volume   Culture  Setup Time   Final    GRAM POSITIVE RODS AEROBIC BOTTLE ONLY CRITICAL RESULT CALLED TO, READ BACK BY AND VERIFIED WITH: MADELYN MITCHELL AT 6045 03/15/23.PMF Performed at [facility], [address] [place], [place] [address]    Culture GRAM POSITIVE RODS  Final   Report Status PENDING  Incomplete  SARS Coronavirus 2 by RT PCR (hospital order, performed in [facility] hospital lab) *cepheid single result test* Anterior Nasal Swab     Status: None   Collection Time: 03/13/23 11:31 PM   Specimen: Anterior Nasal Swab  Result Value Ref Range Status   SARS Coronavirus 2 by RT PCR NEGATIVE NEGATIVE Final    Comment: (NOTE) SARS-CoV-2 target nucleic acids are NOT DETECTED.  The SARS-CoV-2 RNA is generally detectable in upper and lower respiratory specimens during the acute phase of infection. The lowest concentration of SARS-CoV-2 viral copies this assay can detect is 250 copies / mL. A negative result does not preclude SARS-CoV-2 infection and should not be used as the sole basis for treatment or other patient management decisions.  A negative result may occur with improper specimen collection / handling, submission of specimen other than nasopharyngeal swab, presence of viral mutation(s) within the areas targeted by this assay, and inadequate number of viral  copies (<250 copies / mL). A negative result must be combined with clinical observations, patient history, and epidemiological information.  Fact Sheet for Patients:   RoadLapTop.co.za  Fact Sheet for Healthcare Providers: http://kim-miller.com/  This test is not yet approved or  cleared by the [place] FDA and has been authorized for detection and/or diagnosis of SARS-CoV-2 by FDA under an Emergency Use Authorization (EUA).  This EUA will remain in effect (meaning this test can be used) for the duration of the COVID-19 declaration under Section 564(b)(1) of the Act, 21 U.S.C. section 360bbb-3(b)(1), unless the authorization is terminated or revoked sooner.  Performed at  [facility], [address] [place], [place] [address]          Radiology Studies: CT Chest Wo Contrast  Result Date: 03/13/2023 CLINICAL DATA:  Unresponsive and possible seizures. EXAM: CT CHEST WITHOUT CONTRAST TECHNIQUE: Multidetector CT imaging of the chest was performed following the standard protocol without IV contrast. RADIATION DOSE REDUCTION: This exam was performed according to the departmental dose-optimization program which includes automated exposure control, adjustment of the mA and/or kV according to patient size and/or use of iterative reconstruction technique. COMPARISON:  Chest radiograph 03/13/2023 and CTA chest 02/23/2023 FINDINGS: Cardiovascular: Trace pericardial effusion. Mild aortic and coronary artery atherosclerotic calcification. Right chest wall Port-A-Cath tip at the superior cavoatrial junction. Mediastinum/Nodes: Small hiatal hernia containing fluid. Esophagus is otherwise unremarkable. Question secretions in the lower trachea though evaluation is limited by respiratory motion. Possible enlarged left paratracheal lymph node on 2/42 measuring 13 mm. This was not well visualized previously. Similar 9 mm right paratracheal lymph  node. Lungs/Pleura: Advanced emphysema. Scarring, architectural distortion, bronchiectasis/bronchiolectasis about the superior left hilum likely due to radiation change. Decreased patchy airspace opacity throughout the left upper lobe since 02/23/2023. Respiratory motion obscures detail however there is some residual ill-defined ground-glass and nodular opacities. For example along the posterior left lower lobe on 4/68). Similar airspace disease in the right lower lobe. There is likely debris within both mainstem bronchi and multiple lobar and subsegmental bronchi however motion limits assessment. No pleural effusion or pneumothorax. Upper Abdomen: No acute abnormality. Musculoskeletal: No acute fracture. Remote left glenoid fossa fracture. Multiple sclerotic lesions in the spine are similar to prior. Exaggerated thoracic kyphosis. IMPRESSION: 1. Decreased patchy airspace opacity throughout the left upper lobe since 02/23/2023 with some residual ill-defined ground-glass and nodular opacities. Similar airspace disease in the right lower lobe. 2. Likely debris within the trachea and both mainstem bronchi suggesting aspiration. 3. Enlarged left paratracheal lymph node measuring 13 mm. This was not well visualized previously. Continued attention on follow-up. 4. 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 5. Small hiatal hernia containing fluid. Aortic Atherosclerosis (ICD10-I70.0) and Emphysema (ICD10-J43.9). Electronically Signed   By: Minerva Fester M.D.   On: 03/13/2023 23:27   CT HEAD WO CONTRAST ( )  Result Date: 03/13/2023 CLINICAL DATA:  Recent seizure activity with headaches and neck pain, initial encounter EXAM: CT HEAD WITHOUT CONTRAST CT CERVICAL SPINE WITHOUT CONTRAST TECHNIQUE: Multidetector CT imaging of the head and cervical spine was performed following the standard protocol without intravenous contrast. Multiplanar CT image reconstructions of the cervical spine were also  generated. RADIATION DOSE REDUCTION: This exam was performed according to the departmental dose-optimization program which includes automated exposure control, adjustment of the mA and/or kV according to patient size and/or use of iterative reconstruction technique. COMPARISON:  02/23/2019 FINDINGS: CT HEAD FINDINGS Brain: No acute hemorrhage is identified. In the posterior right temporal lobe best seen on axial image number 18 of series 4 there is a 1.1 cm mildly hyperdense lesion identified. No surrounding edema is seen. No other focal mass lesion is noted. Geographic area of decreased attenuation is noted with right superior cerebellum best seen on axial image number 12 of series 4. This is suspicious for subacute ischemia. This was not present on the prior exam. Vascular: No hyperdense vessel or unexpected calcification. Skull: Normal. Negative for fracture or focal lesion. Sinuses/Orbits: No acute finding. Other: None. CT CERVICAL SPINE FINDINGS Alignment: Within normal limits. Skull base and vertebrae: 7 cervical segments are well visualized.  Vertebral body height is well maintained. Multilevel osteophytic change and facet hypertrophic changes are noted. No acute fracture or acute facet abnormality is noted. Sclerotic foci are noted in the anterior aspect of the T5 and T6 vertebral body suspicious for bony metastatic disease. Soft tissues and spinal canal: Surrounding soft tissue structures are within normal limits. Right chest wall port is seen. Upper chest: Emphysematous changes are noted. No other focal abnormality is noted. Other: None IMPRESSION: CT of the head: Hyperdense lesion in the posterior aspect of the right temporal lobe better visualized than on prior exam. These changes are suspicious for underlying cavernoma. MRI of the head may be helpful for further evaluation. Area of decreased attenuation in the superior aspect of the right cerebellum consistent with subacute ischemia. This was not present  on the prior CT examination. This could also be evaluated with MRI. CT of the cervical spine: Multilevel degenerative change without acute abnormality. Sclerotic foci consistent with metastatic disease. Electronically Signed   By: Alcide Clever M.D.   On: 03/13/2023 23:21   CT Cervical Spine Wo Contrast  Result Date: 03/13/2023 CLINICAL DATA:  Recent seizure activity with headaches and neck pain, initial encounter EXAM: CT HEAD WITHOUT CONTRAST CT CERVICAL SPINE WITHOUT CONTRAST TECHNIQUE: Multidetector CT imaging of the head and cervical spine was performed following the standard protocol without intravenous contrast. Multiplanar CT image reconstructions of the cervical spine were also generated. RADIATION DOSE REDUCTION: This exam was performed according to the departmental dose-optimization program which includes automated exposure control, adjustment of the mA and/or kV according to patient size and/or use of iterative reconstruction technique. COMPARISON:  02/23/2019 FINDINGS: CT HEAD FINDINGS Brain: No acute hemorrhage is identified. In the posterior right temporal lobe best seen on axial image number 18 of series 4 there is a 1.1 cm mildly hyperdense lesion identified. No surrounding edema is seen. No other focal mass lesion is noted. Geographic area of decreased attenuation is noted with right superior cerebellum best seen on axial image number 12 of series 4. This is suspicious for subacute ischemia. This was not present on the prior exam. Vascular: No hyperdense vessel or unexpected calcification. Skull: Normal. Negative for fracture or focal lesion. Sinuses/Orbits: No acute finding. Other: None. CT CERVICAL SPINE FINDINGS Alignment: Within normal limits. Skull base and vertebrae: 7 cervical segments are well visualized. Vertebral body height is well maintained. Multilevel osteophytic change and facet hypertrophic changes are noted. No acute fracture or acute facet abnormality is noted. Sclerotic foci  are noted in the anterior aspect of the T5 and T6 vertebral body suspicious for bony metastatic disease. Soft tissues and spinal canal: Surrounding soft tissue structures are within normal limits. Right chest wall port is seen. Upper chest: Emphysematous changes are noted. No other focal abnormality is noted. Other: None IMPRESSION: CT of the head: Hyperdense lesion in the posterior aspect of the right temporal lobe better visualized than on prior exam. These changes are suspicious for underlying cavernoma. MRI of the head may be helpful for further evaluation. Area of decreased attenuation in the superior aspect of the right cerebellum consistent with subacute ischemia. This was not present on the prior CT examination. This could also be evaluated with MRI. CT of the cervical spine: Multilevel degenerative change without acute abnormality. Sclerotic foci consistent with metastatic disease. Electronically Signed   By: Alcide Clever M.D.   On: 03/13/2023 23:21   DG Chest Portable 1 View  Result Date: 03/13/2023 CLINICAL DATA:  Shortness of breath EXAM:  PORTABLE CHEST 1 VIEW COMPARISON:  02/23/2023, PET CT 11/04/2022 FINDINGS: Right-sided central venous port tip at the proximal right atrium. Borderline cardiomegaly. No acute airspace disease or pleural effusion. Mild diffuse bronchitic changes. No pneumothorax IMPRESSION: Mild diffuse bronchitic changes. Electronically Signed   By: Jasmine Pang M.D.   On: 03/13/2023 22:29        Scheduled Meds:  enoxaparin (LOVENOX) injection  40 mg Subcutaneous QHS   fluPHENAZine  5 mg Oral Daily   guaiFENesin  1,200 mg Oral BID   lisinopril  10 mg Oral Daily   pantoprazole (PROTONIX) IV  40 mg Intravenous Q24H   potassium chloride  40 mEq Oral Once   propranolol  5 mg Oral BID   Continuous Infusions:  ampicillin-sulbactam (UNASYN) IV 3 g (03/15/23 6010)   levETIRAcetam 1,000 mg (03/15/23 0916)   magnesium sulfate bolus IVPB 6 g (03/15/23 0927)     LOS: 0  days    Time spent: 40 minutes    Ramiro Harvest, MD [facility]   To contact the attending provider between 7A-7P or the covering provider during after hours 7P-7A, please log into the web site [email or web address] and access using universal [place] password for that web site. If you do not have the password, please call the hospital operator.  03/15/2023, 10:42 AM

## 2023-03-15 NOTE — Progress Notes (Signed)
Physical Therapy Treatment Patient Details Name: Trevor Montgomery MRN: [id number] DOB: [birth year] Today's Date: 03/15/2023   History of Present Illness 62 y.o. male presents to [facility] hospital on 03/13/2023 with concern for possible seizures. 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 PMH includes metastatic lung cancer, HTN, HLD, COPD, seizures.    PT Comments    Pt displays deficits in cognition, having difficulty motor planning to release the phone to hand it to the nurse tech even though asking the nurse tech to take it. He was able to ambulate without LOB at a min guard-supervision level utilizing a rollator today. He needs cues to widen his BOS but did not bump into anything today. Will continue to follow acutely.     Recommendations for follow up therapy are one component of a multi-disciplinary discharge planning process, led by the attending physician.  Recommendations may be updated based on patient status, additional functional criteria and insurance authorization.  Follow Up Recommendations       Assistance Recommended at Discharge Frequent or constant Supervision/Assistance  Patient can return home with the following A little help with walking and/or transfers;A little help with bathing/dressing/bathroom;Assistance with cooking/housework;Assistance with feeding;Direct supervision/assist for medications management;Direct supervision/assist for financial management;Assist for transportation;Help with stairs or ramp for entrance   Equipment Recommendations  None recommended by PT (owns rollator, anticipate this device to provide sufficient stability)    Recommendations for Other Services       Precautions / Restrictions Precautions Precautions: Fall Precaution Comments: seizures Restrictions Weight Bearing Restrictions: No     Mobility  Bed Mobility Overal bed mobility: Needs Assistance Bed Mobility: Supine to Sit, Sit to Supine     Supine to sit:  Supervision, HOB elevated Sit to supine: Supervision, HOB elevated   General bed mobility comments: Supervision for safety, HOB elevated    Transfers Overall transfer level: Needs assistance Equipment used: Rollator (4 wheels) Transfers: Sit to/from Stand Sit to Stand: Min guard           General transfer comment: Pt pulling up on rollator, no LOB, min guard for safety    Ambulation/Gait Ambulation/Gait assistance: Min guard, Supervision Gait Distance (Feet): 110 Feet Assistive device: Rollator (4 wheels) Gait Pattern/deviations: Step-through pattern, Decreased stride length, Trunk flexed, Narrow base of support Gait velocity: reduced Gait velocity interpretation: <1.8 ft/sec, indicate of risk for recurrent falls   General Gait Details: Pt ambulates smoothly with rollator, maintaining his kyphotic posture. Cued pt to widen his stance, momentary success noted. No LOB, min guard-supervision for safety   Stairs             Wheelchair Mobility    Modified Rankin (Stroke Patients Only)       Balance Overall balance assessment: Needs assistance Sitting-balance support: No upper extremity supported, Feet supported Sitting balance-Leahy Scale: Good     Standing balance support: Single extremity supported, Bilateral upper extremity supported, Reliant on assistive device for balance Standing balance-Leahy Scale: Poor Standing balance comment: Reliant on rollator                            Cognition Arousal/Alertness: Awake/alert Behavior During Therapy: WFL for tasks assessed/performed Overall Cognitive Status: Impaired/Different from baseline Area of Impairment: Problem solving, Memory, Attention                   Current Attention Level: Selective Memory: Decreased short-term memory       Problem  Solving: Slow processing, Difficulty sequencing, Decreased initiation, Requires verbal cues, Requires tactile cues General Comments: Upon  arrival, pt asking NT to take the phone but perseveratingly flexing and extending his elbow to hand the phone to her yet keeping a strong grasp on the phone. Did not seem to understand why the phone could not be taken and then seemed to forget what he was trying to do.        Exercises      General Comments General comments (skin integrity, edema, etc.): VSS on 3L at rest, SpO2 >/= 93% on RA at rest, dropped to 87% on RA when ambulating, returned pt to 3L at end of session      Pertinent Vitals/Pain Pain Assessment Pain Assessment: Faces Faces Pain Scale: No hurt Pain Intervention(s): Monitored during session    Home Living                          Prior Function            PT Goals (current goals can now be found in the care plan section) Acute Rehab PT Goals Patient Stated Goal: to return home PT Goal Formulation: With patient Time For Goal Achievement: 03/28/23 Potential to Achieve Goals: Fair Progress towards PT goals: Progressing toward goals    Frequency    Min 3X/week      PT Plan Current plan remains appropriate    Co-evaluation              AM-PAC PT "6 Clicks" Mobility   Outcome Measure  Help needed turning from your back to your side while in a flat bed without using bedrails?: A Little Help needed moving from lying on your back to sitting on the side of a flat bed without using bedrails?: A Little Help needed moving to and from a bed to a chair (including a wheelchair)?: A Little Help needed standing up from a chair using your arms (e.g., wheelchair or bedside chair)?: A Little Help needed to walk in hospital room?: A Little Help needed climbing 3-5 steps with a railing? : A Lot 6 Click Score: 17    End of Session Equipment Utilized During Treatment: Gait belt;Oxygen Activity Tolerance: Patient tolerated treatment well Patient left: in bed;with call bell/phone within reach;with bed alarm set   PT Visit Diagnosis: Other  abnormalities of gait and mobility (R26.89);Muscle weakness (generalized) (M62.81);Unsteadiness on feet (R26.81)     Time: 9528-4132 PT Time Calculation (min) (ACUTE ONLY): 15 min  Charges:  $Gait Training: 8-22 mins                     Raymond Gurney, PT, DPT Acute Rehabilitation Services  Office: [phone]    Jewel Baize 03/15/2023, 5:40 PM

## 2023-03-15 NOTE — Evaluation (Signed)
Occupational Therapy Evaluation Patient Details Name: Trevor Montgomery MRN: [id number] DOB: 06-27-1961 Today's Date: 03/15/2023   History of Present Illness 62 y.o. male presents to [facility] hospital on 03/13/2023 with concern for possible seizures. 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 PMH includes metastatic lung cancer, HTN, HLD, COPD, seizures.   Clinical Impression   Pt received in bed, agreeable to OT session. Pt oriented to self and frequently stating during session "I am in the wrong time period". Therapist reorienting pt throughout session. Pt currently requires supervision for bed mobility, minA for LB dressing and minA for functional mobility at rollator level. He requires extending time for task completion and requires frequent cues for sequencing and task initiation. During ambulation to the sink, pt stopped and began perseverating stating "I need to go home" and "I'm not in the right time period". Pt visibly upset and returned to bed to lie down.       Recommendations for follow up therapy are one component of a multi-disciplinary discharge planning process, led by the attending physician.  Recommendations may be updated based on patient status, additional functional criteria and insurance authorization.   Assistance Recommended at Discharge Frequent or constant Supervision/Assistance  Patient can return home with the following A little help with walking and/or transfers;A little help with bathing/dressing/bathroom    Functional Status Assessment  Patient has had a recent decline in their functional status and demonstrates the ability to make significant improvements in function in a reasonable and predictable amount of time.  Equipment Recommendations  None recommended by OT    Recommendations for Other Services       Precautions / Restrictions Precautions Precautions: Fall Precaution Comments: seizures Restrictions Weight Bearing Restrictions: No       Mobility Bed Mobility Overal bed mobility: Needs Assistance Bed Mobility: Supine to Sit, Sit to Supine     Supine to sit: Supervision Sit to supine: Supervision        Transfers Overall transfer level: Needs assistance Equipment used: Rolling walker (2 wheels), None Transfers: Sit to/from Stand Sit to Stand: Min assist           General transfer comment: minA for stability and safety, no loss of balance noted this session      Balance Overall balance assessment: Needs assistance Sitting-balance support: No upper extremity supported, Feet supported Sitting balance-Leahy Scale: Good     Standing balance support: Single extremity supported, Bilateral upper extremity supported, Reliant on assistive device for balance Standing balance-Leahy Scale: Poor                             ADL either performed or assessed with clinical judgement   ADL Overall ADL's : Needs assistance/impaired Eating/Feeding: Set up;Sitting Eating/Feeding Details (indicate cue type and reason): drank water Grooming: Minimal assistance;Standing Grooming Details (indicate cue type and reason): was unable to complete during session as pt perseverating on being in the wrong time period and returning to bed, however using clinical judgement anticipate pt to need minA for sequencing Upper Body Bathing: Min guard;Sitting   Lower Body Bathing: Minimal assistance;Sit to/from stand   Upper Body Dressing : Min guard;Sitting   Lower Body Dressing: Minimal assistance;Sit to/from stand   Toilet Transfer: Minimal assistance;Ambulation;Rollator (4 wheels) Toilet Transfer Details (indicate cue type and reason): pt with catheter in place, anticipate he would need minA for safety, stability and sequencing Toileting- Clothing Manipulation and Hygiene: Minimal assistance;Sit to/from  stand       Functional mobility during ADLs: Minimal assistance;Rollator (4 wheels);Cueing for safety;Cueing for  sequencing General ADL Comments: pt limited 2/2 cognition, instability, decreased activity tolerance     Vision         Perception     Praxis      Pertinent Vitals/Pain Pain Assessment Pain Assessment: No/denies pain     Hand Dominance Right   Extremity/Trunk Assessment Upper Extremity Assessment Upper Extremity Assessment: Overall WFL for tasks assessed   Lower Extremity Assessment Lower Extremity Assessment: Defer to PT evaluation   Cervical / Trunk Assessment Cervical / Trunk Assessment: Kyphotic   Communication Communication Communication: No difficulties   Cognition Arousal/Alertness: Awake/alert Behavior During Therapy: WFL for tasks assessed/performed Overall Cognitive Status: Impaired/Different from baseline Area of Impairment: Problem solving, Orientation, Following commands, Safety/judgement, Awareness                 Orientation Level: Disoriented to, Place, Time     Following Commands: Follows one step commands inconsistently, Follows one step commands with increased time Safety/Judgement: Decreased awareness of safety Awareness: Intellectual Problem Solving: Slow processing General Comments: pt frequently stating "im in the wrong period" pt reoriented after each statement. Pt following one step commands 50% of the time with increased cueing and increased time for processing. pt stopped while ambulating to sink, again stated "Im in the wrong time period", returned to bed and laid back down, pt with visibly upset. provided comfort to pt and assisted him to get comfortable in the bed.     General Comments  vss on 3lnc, pt had bout of emesis at start of session, RN present and aware    Exercises     Shoulder Instructions      Home Living Family/patient expects to be discharged to:: Private residence Living Arrangements: Other relatives Available Help at Discharge: Family;Available 24 hours/day Type of Home: House Home Access: Stairs to  enter [employer] of Steps: 3 + 1 Entrance Stairs-Rails: None Home Layout: One level     Bathroom Shower/Tub: Tub/shower unit;Walk-in shower   Bathroom Toilet: Handicapped height     Home Equipment: Rollator (4 wheels);Shower seat;Grab bars - tub/shower;Cane - quad   Additional Comments: pt poor historian at this time, information gathered from chart      Prior Functioning/Environment Prior Level of Function : Independent/Modified Independent             Mobility Comments: ambulates with quad cane;pt poor historian, information gathered from chart ADLs Comments: Ind with ADLs        OT Problem List: Decreased activity tolerance;Impaired balance (sitting and/or standing);Decreased cognition      OT Treatment/Interventions: Therapeutic exercise;Self-care/ADL training;Energy conservation;DME and/or AE instruction;Cognitive remediation/compensation;Patient/family education;Balance training    OT Goals(Current goals can be found in the care plan section) Acute Rehab OT Goals Patient Stated Goal: to go home OT Goal Formulation: With patient Time For Goal Achievement: 03/29/23 Potential to Achieve Goals: Good ADL Goals Pt Will Perform Grooming: with set-up;standing Pt Will Perform Lower Body Dressing: with set-up;sit to/from stand Pt Will Transfer to Toilet: with set-up;ambulating Additional ADL Goal #1: Pt will follow one step commands consistently throughout session.  OT Frequency: Min 2X/week    Co-evaluation              AM-PAC OT "6 Clicks" Daily Activity     Outcome Measure Help from another person eating meals?: A Little Help from another person taking care of personal grooming?: A Little Help  from another person toileting, which includes using toliet, bedpan, or urinal?: A Little Help from another person bathing (including washing, rinsing, drying)?: A Little Help from another person to put on and taking off regular upper body clothing?: A  Little Help from another person to put on and taking off regular lower body clothing?: A Little 6 Click Score: 18   End of Session Equipment Utilized During Treatment: Gait belt;Oxygen;Rollator (4 wheels) Nurse Communication: Mobility status  Activity Tolerance: Patient tolerated treatment well;Other (comment) (session limited 2/2 cognition) Patient left: in bed;with call bell/phone within reach;with bed alarm set;with nursing/sitter in room  OT Visit Diagnosis: Other abnormalities of gait and mobility (R26.89);Other symptoms and signs involving cognitive function                Time: 0902-0939 OT Time Calculation (min): 37 min Charges:  OT General Charges $OT Visit: 1 Visit OT Evaluation $OT Eval Moderate Complexity: 1 Mod OT Treatments $Self Care/Home Management : 8-22 mins  Rosey Bath OTR/L Acute Rehabilitation Services Office: [phone]   Rebeca Alert 03/15/2023, 10:28 AM

## 2023-03-15 NOTE — Progress Notes (Signed)
PT Cancellation Note  Patient Details Name: Trevor Montgomery MRN: [id number] DOB: December 12, 1960   Cancelled Treatment:    Reason Eval/Treat Not Completed: (P) Other (comment). Upon arrival to treat pt after OT session pt perseverating on "being in the wrong time period" and visibly upset, lying himself down in bed. OT present and suggested PT try again later if time permits.   Raymond Gurney, PT, DPT Acute Rehabilitation Services  Office: [phone]    Jewel Baize 03/15/2023, 9:37 AM

## 2023-03-16 DIAGNOSIS — R569 Unspecified convulsions: Secondary | ICD-10-CM | POA: Diagnosis not present

## 2023-03-16 LAB — BASIC METABOLIC PANEL
Anion gap: 12 (ref 5–15)
BUN: 5 mg/dL — ABNORMAL LOW (ref 6–20)
CO2: 25 mmol/L (ref 22–32)
Calcium: 9.4 mg/dL (ref 8.9–10.3)
Chloride: 96 mmol/L — ABNORMAL LOW (ref 98–111)
Creatinine, Ser: 0.71 mg/dL (ref 0.61–1.24)
GFR, Estimated: >60 mL/min (ref >60)
Glucose, Bld: 108 mg/dL — ABNORMAL HIGH (ref 70–99)
Potassium: 3.4 mmol/L — ABNORMAL LOW (ref 3.5–5.1)
Sodium: 133 mmol/L — ABNORMAL LOW (ref 135–145)

## 2023-03-16 LAB — CBC WITH DIFFERENTIAL/PLATELET
Abs Immature Granulocytes: 0.03 10*3/uL (ref 0.00–0.07)
Basophils Absolute: 0 10*3/uL (ref 0.0–0.1)
Basophils Relative: 0 %
Eosinophils Absolute: 0 10*3/uL (ref 0.0–0.5)
Eosinophils Relative: 0 %
HCT: 34.6 % — ABNORMAL LOW (ref 39.0–52.0)
Hemoglobin: 11.7 g/dL — ABNORMAL LOW (ref 13.0–17.0)
Immature Granulocytes: 0 %
Lymphocytes Relative: 15 %
Lymphs Abs: 1.1 10*3/uL (ref 0.7–4.0)
MCH: 29.3 pg (ref 26.0–34.0)
MCHC: 33.8 g/dL (ref 30.0–36.0)
MCV: 86.7 fL (ref 80.0–100.0)
Monocytes Absolute: 0.6 10*3/uL (ref 0.1–1.0)
Monocytes Relative: 8 %
Neutro Abs: 5.7 10*3/uL (ref 1.7–7.7)
Neutrophils Relative %: 77 %
Platelets: 294 10*3/uL (ref 150–400)
RBC: 3.99 MIL/uL — ABNORMAL LOW (ref 4.22–5.81)
RDW: 13 % (ref 11.5–15.5)
WBC: 7.4 10*3/uL (ref 4.0–10.5)
nRBC: 0 % (ref 0.0–0.2)

## 2023-03-16 LAB — MAGNESIUM: Magnesium: 1.6 mg/dL — ABNORMAL LOW (ref 1.7–2.4)

## 2023-03-16 LAB — CULTURE, BLOOD (ROUTINE X 2): Special Requests: ADEQUATE

## 2023-03-16 MED ORDER — POTASSIUM CHLORIDE CRYS ER 10 MEQ PO TBCR
40.0000 meq | EXTENDED_RELEASE_TABLET | ORAL | Status: AC
  Administered 2023-03-16: 40 meq via ORAL
  Filled 2023-03-16 (×2): qty 4

## 2023-03-16 MED ORDER — LORAZEPAM 2 MG/ML IJ SOLN: 0.5000 mg | Freq: Once | INTRAMUSCULAR | Status: DC

## 2023-03-16 MED ORDER — MAGNESIUM SULFATE 50 % IJ SOLN
6.0000 g | Freq: Once | INTRAVENOUS | Status: AC
  Administered 2023-03-16: 6 g via INTRAVENOUS
  Filled 2023-03-16: qty 12

## 2023-03-16 MED ORDER — MAGNESIUM OXIDE -MG SUPPLEMENT 400 (240 MG) MG PO TABS
400.0000 mg | ORAL_TABLET | Freq: Two times a day (BID) | ORAL | Status: DC
  Administered 2023-03-17: 400 mg via ORAL
  Filled 2023-03-16: qty 1

## 2023-03-16 NOTE — Progress Notes (Signed)
PROGRESS NOTE    Trevor Montgomery  FAO:[id number] DOB: 1961/06/20 DOA: 03/14/2023 PCP: Barbette Reichmann, MD    No chief complaint on file.   Brief Narrative:  Patient is a 62 year old gentleman history of metastatic lung cancer on palliative chemo radiation, hypertension hyperlipidemia COPD, history of provoked seizures in the setting of electrolyte imbalance on Keppra 500 twice daily noted at his baseline. Patient noted to be cooking and subsequently slumped over was poorly responsive with a rightward gaze. EMS called patient noted to have a right gaze deviation with jerking upper extremities concerning for seizure-like activity and given Versed on arrival to [facility] ED. On presentation to [facility] ED patient had persistent right gaze deviation received Keppra 4 g and Ativan 4 mg with resultant improvement with right gaze deviation however remains significantly obtunded. 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 Neurology consulted and patient transferred to [facility] for continuous EEG for further evaluation.    Assessment & Plan:   Principal Problem:   Seizure (HCC) Active Problems:   Aspiration pneumonia (HCC)   Hyponatremia   Schizophrenia (HCC)   Hypertension   Metastatic lung cancer (metastasis from lung to other site) (HCC)   Hypokalemia   Chronic obstructive lung disease (HCC)   Chronic hypoxemic respiratory failure (HCC)   Hypomagnesemia   Cerebrovascular accident (CVA) (HCC)  1.  Breakthrough seizures -Patient presented with breakthrough seizures, recent admission for seizures 02/23/2023. -Per neurology breakthrough seizures potentially likely secondary to electrolyte abnormalities in the setting of hypomagnesemia, hyponatremia. -CT head done with hyperdense lesion in the right temporal lobe suspicious for underlying  cavernoma.  CT head also with concerns for superior aspect of right cerebellum concerning for subacute ischemia. -Patient noted with prior history of seizures secondary to electrolyte abnormalities. -Continuous EEG done.  -MRI ordered however canceled as unable to get due to significant kyphosis.  It is noted that during last hospitalization MRI was unable to be obtained as well due to significant kyphosis.  -CT angiogram head and neck ordered as well as CT venogram ordered per neurology and per neurology images concerning for metastatic disease to the brain.. -Patient with no further seizure-like activity. -Continue current increased dose of Keppra 1000 mg twice daily. -Replete electrolytes. -Patient started on Decadron due to concern for brain mets however Decadron discontinued today 03/16/2023 due to concerns for agitation. -Seizure precautions, fall precautions. -Neurology following and appreciate their input and recommendations.   2.  Hypomagnesemia/hypokalemia -Potassium at 3.4, magnesium at 1.6.   -Magnesium sulfate 6 g IV x 1.   -Replete potassium.   -Repeat labs in the AM.    3.  Hyponatremia -Sodium noted initially on presentation at 127 improved to 133 (03/14/2023) and currently at 133 today. 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 -Continue to hold home regimen salt tablets. -Saline lock IV fluids. -Repeat labs in the AM.    4.  Aspiration pneumonia/chronic respiratory failure with hypoxia secondary to COPD -Patient chronically on 3 L home O2 at baseline. -CT chest admission concern for aspiration pneumonia. -Sputum Gram stain and culture pending.  -Urine Legionella antigen pending, urine pneumococcus antigen pending.  -Continue Mucinex twice daily, nebulizer treatments, IV Unasyn, aspiration precautions.  -Patient seen by SLP.    5.  Hypertension -Continue lisinopril, propranolol.    6.  Schizophrenia -Continue home  regimen Prolixin.   -Cogentin changed to twice daily as needed per psych recommendations.    7.  Subacute ischemia/likely metastatic disease -Noted on head CT in the cerebellar region. -MRI head unable to be done due to significant kyphosis.. -It is noted during prior hospitalization that MRI head was attempted however unable to be done due to patient's kyphosis. -As MRI unable to be done patient assessed by neurology who recommended and ordered CT angio head and neck for possible stroke workup, CT venogram to evaluate for possible metastatic disease/dural venous sinus thrombosis given malignant history.   -Images reviewed per neurology and imaging concerning for brain metastases. -Patient was started on dexamethasone 4 mg daily per neurology however due to agitation some confusion neurology reassess patient and dexamethasone discontinued. -Neurology spoke with neuro-oncology, Dr. Barbaraann Cao and patient's case will be discussed at tumor board, outpatient follow-up with Dr. Barbaraann Cao with repeat CT with contrast to monitor lesions on outpatient basis. -Neurology was following but have signed off as of 03/16/2023.  8.  Metastatic lung cancer -Currently on palliative chemoradiation. -Outpatient follow-up with oncology.     DVT prophylaxis: Lovenox Code Status: DNR Family Communication: Updated patient. Updated sister at on phone.  Disposition: TBD  Status is: Observation The patient remains OBS appropriate and will d/c before 2 midnights.   Consultants:  Neurology: Dr.Khaliqdina 03/14/2023 Psychiatry: Dr. Rebecca Eaton 03/14/2023  Procedures:  CT head 03/13/2023 CT C-spine 03/13/2023 CT chest 03/13/2023 Chest x-ray 03/13/2023 EEG 03/14/2023   Antimicrobials:  Anti-infectives (From admission, onward)    Start     Dose/Rate Route Frequency Ordered Stop   03/14/23 1130  Ampicillin-Sulbactam (UNASYN) 3 g in sodium chloride 0.9 % 100 mL IVPB        3 g 200 mL/hr over 30 Minutes Intravenous Every 6  hours 03/14/23 1034           Subjective: Was called by nursing that patient agitated and confused this morning.  Went and assessed patient patient alert oriented to self place and time.  Knows who the president is.  Seem to have a little bit of bouts of confusion stating if we helped him out he will protect his against Satan.  Patient asking for blankets.    Objective: Vitals:   03/15/23 2353 03/16/23 0357 03/16/23 0404 03/16/23 0802  BP: (!) 153/87 (!) 173/94  (!) 152/87  Pulse: (!) 106 (!) 113 (!) 107 (!) 115  Resp: 16 20  18   Temp: 98.4 F (36.9 C) 98 F (36.7 C)  98.5 F (36.9 C)  TempSrc:    Axillary  SpO2: 95% 98%  98%  Height:        Intake/Output Summary (Last 24 hours) at 03/16/2023 0945 Last data filed at 03/16/2023 1610 Gross per 24 hour  Intake 1242.25 ml  Output 2225 ml  Net -982.75 ml    There were no vitals filed for this visit.  Examination:  General exam: Appears calm and comfortable.  Kyphosis. Respiratory system: Some coarse breath sounds.  No wheezing.  Fair air movement.  Speaking in full sentences. Cardiovascular system: RRR no murmurs rubs or gallops.  No JVD.  No lower extremity edema.  Gastrointestinal system: Abdomen is soft, nontender, nondistended, positive bowel sounds.  No rebound.  No guarding.  Central nervous system: Alert and oriented x 3.  Dysarthric speech.  Confusion noted.  Moving extremities spontaneously.  No focal neurological deficits..  Extremities: Symmetric 5 x 5 power. Skin: No rashes, lesions or ulcers Psychiatry: Judgement and insight  appear poor to fair. Mood & affect appropriate.     Data Reviewed: I have personally reviewed following labs and imaging studies  CBC: Recent Labs  Lab 03/13/23 2201 03/14/23 0636 03/15/23 0442 03/16/23 0622  WBC 6.2 6.2 5.9 7.4  NEUTROABS 3.7  --   --  5.7  HGB 10.7* 10.9* 11.0* 11.7*  HCT 32.6* 32.6* 33.0* 34.6*  MCV 91.6 87.6 88.0 86.7  PLT 265 293 269 294     Basic  Metabolic Panel: Recent Labs  Lab 03/13/23 2201 03/14/23 0636 03/14/23 0754 03/15/23 0442 03/16/23 0622  NA 127* 133*  --  133* 133*  K 3.8 3.4*  --  3.4* 3.4*  CL 91* 91*  --  98 96*  CO2 28 27  --  27 25  GLUCOSE 129* 102*  --  94 108*  BUN 7 <5*  --  <5* 5*  CREATININE 0.57* 0.54*  --  0.50* 0.71  CALCIUM 8.3* 9.3  --  8.9 9.4  MG 1.3*  --  1.7 1.6* 1.6*     GFR: Estimated Creatinine Clearance: 112.6 mL/min (by C-G formula based on SCr of 0.71 mg/dL).  Liver Function Tests: Recent Labs  Lab 03/13/23 2201 03/15/23 0442  AST 16 13*  ALT 12 12  ALKPHOS 76 73  BILITOT 0.5 0.7  PROT 6.6 5.9*  ALBUMIN 3.9 3.3*     CBG: No results for input(s): "GLUCAP" in the last 168 hours.   Recent Results (from the past 240 hour(s))  Blood culture (routine x 2)     Status: None (Preliminary result)   Collection Time: 03/13/23 10:01 PM   Specimen: BLOOD RIGHT ARM  Result Value Ref Range Status   Specimen Description BLOOD RIGHT ARM  Final   Special Requests   Final    BOTTLES DRAWN AEROBIC AND ANAEROBIC Blood Culture adequate volume   Culture   Final    NO GROWTH 3 DAYS Performed at [facility], [address], [place], [place] [address]    Report Status PENDING  Incomplete  Blood culture (routine x 2)     Status: None (Preliminary result)   Collection Time: 03/13/23 10:01 PM   Specimen: BLOOD LEFT ARM  Result Value Ref Range Status   Specimen Description   Final    BLOOD LEFT ARM Performed at [facility], [address], [place], [place] [address]    Special Requests   Final    BOTTLES DRAWN AEROBIC AND ANAEROBIC Blood Culture adequate volume Performed at [facility], [address], [place], [place] [address]    Culture  Setup Time   Final    GRAM POSITIVE RODS AEROBIC BOTTLE ONLY CRITICAL RESULT CALLED TO, READ BACK BY AND VERIFIED WITH: MADELYN MITCHELL AT 9147 03/15/23.PMF GRAM STAIN REVIEWED-AGREE WITH RESULT Performed at  [facility], [address] [place], [place] [address]    Culture GRAM POSITIVE RODS  Final   Report Status PENDING  Incomplete  SARS Coronavirus 2 by RT PCR (hospital order, performed in [facility] hospital lab) *cepheid single result test* Anterior Nasal Swab     Status: None   Collection Time: 03/13/23 11:31 PM   Specimen: Anterior Nasal Swab  Result Value Ref Range Status   SARS Coronavirus 2 by RT PCR NEGATIVE NEGATIVE Final    Comment: (NOTE) SARS-CoV-2 target nucleic acids are NOT DETECTED.  The SARS-CoV-2 RNA is generally detectable in upper and lower respiratory specimens during the acute phase of  infection. The lowest concentration of SARS-CoV-2 viral copies this assay can detect is 250 copies / mL. A negative result does not preclude SARS-CoV-2 infection and should not be used as the sole basis for treatment or other patient management decisions.  A negative result may occur with improper specimen collection / handling, submission of specimen other than nasopharyngeal swab, presence of viral mutation(s) within the areas targeted by this assay, and inadequate number of viral copies (<250 copies / mL). A negative result must be combined with clinical observations, patient history, and epidemiological information.  Fact Sheet for Patients:   RoadLapTop.co.za  Fact Sheet for Healthcare Providers: http://kim-miller.com/  This test is not yet approved or  cleared by the [place] FDA and has been authorized for detection and/or diagnosis of SARS-CoV-2 by FDA under an Emergency Use Authorization (EUA).  This EUA will remain in effect (meaning this test can be used) for the duration of the COVID-19 declaration under Section 564(b)(1) of the Act, 21 U.S.C. section 360bbb-3(b)(1), unless the authorization is terminated or revoked sooner.  Performed at [facility], [address] [place], [place] [address]           Radiology Studies: CT ANGIO HEAD NECK W WO CM  Result Date: 03/15/2023 CLINICAL DATA:  Stroke/TIA, determine embolic source; Dural venous sinus thrombosis suspected eval for possible metastatic disease, patient too kyphotic for MRI EXAM: CT ANGIOGRAPHY HEAD AND NECK WITH AND WITHOUT CONTRAST CT VENOGRAM HEAD TECHNIQUE: Multidetector CT imaging of the head and neck was performed using the standard protocol during bolus administration of intravenous contrast. Multiplanar CT image reconstructions and MIPs were obtained to evaluate the vascular anatomy. Carotid stenosis measurements (when applicable) are obtained utilizing NASCET criteria, using the distal internal carotid diameter as the denominator. Venographic phase images of the brain were obtained following the administration of intravenous contrast. Multiplanar reformats and maximum intensity projections were generated. RADIATION DOSE REDUCTION: This exam was performed according to the departmental dose-optimization program which includes automated exposure control, adjustment of the mA and/or kV according to patient size and/or use of iterative reconstruction technique. CONTRAST:  75mL OMNIPAQUE IOHEXOL 350 MG/ML SOLN COMPARISON:  CT Head 10/12/22 FINDINGS: CT HEAD FINDINGS Brain: Redemonstrated hypodense lesion lateral right frontal lobe (series 9, image 16), new compared to 02/23/2023. Unchanged hyperdense lesion in the posterior right temporal lobe with slight interval increase in surrounding vasogenic edema (series 9, image 30, 33). There may be an additional contrast-enhancing lesion in the posterior left temporal lobe (series 21, image 105) no hydrocephalus. No extra-axial fluid collection. Vascular: No hyperdense vessel or unexpected calcification. See below Skull: Normal. Negative for fracture or focal lesion. Sinuses/Orbits: No middle ear or mastoid effusion. Paranasal sinuses clear. Orbits are unremarkable. Other: None. Review of the  MIP images confirms the above findings CTA NECK FINDINGS Aortic arch: Standard branching. Imaged portion shows no evidence of aneurysm or dissection. No significant stenosis of the major arch vessel origins. Right carotid system: No evidence of dissection, stenosis (50% or greater), or occlusion. Left carotid system: No evidence of dissection, stenosis (50% or greater), or occlusion. Vertebral arteries: Codominant. No evidence of dissection, stenosis (50% or greater), or occlusion. Skeleton: Redemonstrated are multiple sclerotic regions in the cervical and visualized thoracic vertebral bodies, unchanged from prior exam. Other neck: Negative. Upper chest: See recent prior CT chest for intrathoracic findings. Moderate centrilobular emphysema. Right-sided central venous catheter is only partially imaged. Review of the MIP images confirms the above findings CTA HEAD FINDINGS Anterior  circulation: Moderate to severe narrowing in a proximal left M2 branch (series 16, image 145). Apparent filling defect in the distal left cervical ICA at the skull base is favored to be artifactual (series 16, image 138). Posterior circulation: No significant stenosis, proximal occlusion, aneurysm, or vascular malformation. Venous sinuses: As permitted by contrast timing, patent. Anatomic variants: None Review of the MIP images confirms the above findings CT VENOGRAM FINDINGS: No evidence of dural venous sinus thrombosis. IMPRESSION: 1. Moderate to severe narrowing in a proximal left M2 branch. 2. No intracranial large vessel occlusion. 3. No hemodynamically significant stenosis in the neck. 4. No evidence of dural venous sinus thrombosis. 5. Redemonstrated hypodense lesion in the lateral right frontal lobe, new compared to 02/23/2023. Redemonstrated hyperdense lesion in the posterior right temporal lobe with slight interval increase in surrounding vasogenic edema. There may be an additional contrast-enhancing lesion in the posterior left  temporal lobe. Recommend brain MRI with and without contrast for further evaluation and to assess for the possibilty of intracranial metastatic disease. 6. Redemonstrated are multiple sclerotic regions in the cervical and visualized thoracic vertebral bodies, unchanged from prior exam, concerning for metastatic disease. 7. See recent prior CT chest for intrathoracic findings. 8. Emphysema. Emphysema (ICD10-J43.9). Electronically Signed   By: Lorenza Cambridge M.D.   On: 03/15/2023 12:46   CT VENOGRAM HEAD  Result Date: 03/15/2023 CLINICAL DATA:  Stroke/TIA, determine embolic source; Dural venous sinus thrombosis suspected eval for possible metastatic disease, patient too kyphotic for MRI EXAM: CT ANGIOGRAPHY HEAD AND NECK WITH AND WITHOUT CONTRAST CT VENOGRAM HEAD TECHNIQUE: Multidetector CT imaging of the head and neck was performed using the standard protocol during bolus administration of intravenous contrast. Multiplanar CT image reconstructions and MIPs were obtained to evaluate the vascular anatomy. Carotid stenosis measurements (when applicable) are obtained utilizing NASCET criteria, using the distal internal carotid diameter as the denominator. Venographic phase images of the brain were obtained following the administration of intravenous contrast. Multiplanar reformats and maximum intensity projections were generated. RADIATION DOSE REDUCTION: This exam was performed according to the departmental dose-optimization program which includes automated exposure control, adjustment of the mA and/or kV according to patient size and/or use of iterative reconstruction technique. CONTRAST:  75mL OMNIPAQUE IOHEXOL 350 MG/ML SOLN COMPARISON:  CT Head 10/12/22 FINDINGS: CT HEAD FINDINGS Brain: Redemonstrated hypodense lesion lateral right frontal lobe (series 9, image 16), new compared to 02/23/2023. Unchanged hyperdense lesion in the posterior right temporal lobe with slight interval increase in surrounding vasogenic  edema (series 9, image 30, 33). There may be an additional contrast-enhancing lesion in the posterior left temporal lobe (series 21, image 105) no hydrocephalus. No extra-axial fluid collection. Vascular: No hyperdense vessel or unexpected calcification. See below Skull: Normal. Negative for fracture or focal lesion. Sinuses/Orbits: No middle ear or mastoid effusion. Paranasal sinuses clear. Orbits are unremarkable. Other: None. Review of the MIP images confirms the above findings CTA NECK FINDINGS Aortic arch: Standard branching. Imaged portion shows no evidence of aneurysm or dissection. No significant stenosis of the major arch vessel origins. Right carotid system: No evidence of dissection, stenosis (50% or greater), or occlusion. Left carotid system: No evidence of dissection, stenosis (50% or greater), or occlusion. Vertebral arteries: Codominant. No evidence of dissection, stenosis (50% or greater), or occlusion. Skeleton: Redemonstrated are multiple sclerotic regions in the cervical and visualized thoracic vertebral bodies, unchanged from prior exam. Other neck: Negative. Upper chest: See recent prior CT chest for intrathoracic findings. Moderate centrilobular emphysema. Right-sided central venous  catheter is only partially imaged. Review of the MIP images confirms the above findings CTA HEAD FINDINGS Anterior circulation: Moderate to severe narrowing in a proximal left M2 branch (series 16, image 145). Apparent filling defect in the distal left cervical ICA at the skull base is favored to be artifactual (series 16, image 138). Posterior circulation: No significant stenosis, proximal occlusion, aneurysm, or vascular malformation. Venous sinuses: As permitted by contrast timing, patent. Anatomic variants: None Review of the MIP images confirms the above findings CT VENOGRAM FINDINGS: No evidence of dural venous sinus thrombosis. IMPRESSION: 1. Moderate to severe narrowing in a proximal left M2 branch. 2. No  intracranial large vessel occlusion. 3. No hemodynamically significant stenosis in the neck. 4. No evidence of dural venous sinus thrombosis. 5. Redemonstrated hypodense lesion in the lateral right frontal lobe, new compared to 02/23/2023. Redemonstrated hyperdense lesion in the posterior right temporal lobe with slight interval increase in surrounding vasogenic edema. There may be an additional contrast-enhancing lesion in the posterior left temporal lobe. Recommend brain MRI with and without contrast for further evaluation and to assess for the possibilty of intracranial metastatic disease. 6. Redemonstrated are multiple sclerotic regions in the cervical and visualized thoracic vertebral bodies, unchanged from prior exam, concerning for metastatic disease. 7. See recent prior CT chest for intrathoracic findings. 8. Emphysema. Emphysema (ICD10-J43.9). Electronically Signed   By: Lorenza Cambridge M.D.   On: 03/15/2023 12:46        Scheduled Meds:  dexamethasone (DECADRON) injection  4 mg Intravenous Daily   enoxaparin (LOVENOX) injection  40 mg Subcutaneous QHS   fluPHENAZine  5 mg Oral Daily   guaiFENesin  1,200 mg Oral BID   lisinopril  10 mg Oral Daily   LORazepam  0.5 mg Intravenous Once   [START ON 03/17/2023] magnesium oxide  400 mg Oral BID   pantoprazole (PROTONIX) IV  40 mg Intravenous Q24H   potassium chloride  40 mEq Oral Once   potassium chloride  40 mEq Oral Q4H   propranolol  5 mg Oral BID   Continuous Infusions:  ampicillin-sulbactam (UNASYN) IV 3 g (03/16/23 3244)   levETIRAcetam 1,000 mg (03/15/23 2111)   magnesium sulfate bolus IVPB       LOS: 1 day    Time spent: 40 minutes    Ramiro Harvest, MD [facility]   To contact the attending provider between 7A-7P or the covering provider during after hours 7P-7A, please log into the web site [email or web address] and access using universal [place] password for that web site. If you do not have the password, please call  the hospital operator.  03/16/2023, 9:45 AM

## 2023-03-16 NOTE — Evaluation (Signed)
Speech Language Pathology Evaluation Patient Details Name: Trevor Montgomery MRN: [id number] DOB: 1961-06-29 Today's Date: 03/16/2023 Time: 1256-1310 SLP Time Calculation (min) (ACUTE ONLY): 14 min  Problem List:  Patient Active Problem List   Diagnosis Date Noted   Hypomagnesemia 03/14/2023   Cerebrovascular accident (CVA) (HCC) 03/14/2023   Seizure (HCC) 02/23/2023   Aspiration pneumonia (HCC) 02/23/2023   Lactic acidosis 02/23/2023   Chronic obstructive lung disease (HCC) 02/23/2023   Chronic hypoxemic respiratory failure (HCC) 12/11/2022   Thrombocytopenia (HCC) 11/13/2022   Hypokalemia 11/13/2022   Syncope and collapse 11/12/2022   Metastatic lung cancer (metastasis from lung to other site) [facility]) 10/29/2022   Palliative care encounter 10/16/2022   Sepsis due to pneumonia (HCC) 10/11/2022   Lung mass 10/11/2022   Hypertension 10/10/2022   Tobacco abuse 10/10/2022   Schizophrenia (HCC) 10/10/2022   Acute on chronic respiratory failure with hypoxia (HCC) 10/10/2022   Cough 10/10/2022   Hyponatremia 10/10/2022   CAP (community acquired pneumonia) 10/10/2022   Centrilobular emphysema (HCC) 07/30/2018   Past Medical History:  Past Medical History:  Diagnosis Date   Anxiety    Arthritis    Emphysema of lung (HCC)    HTN (hypertension)    Hyperlipemia    Schizoaffective disorder, bipolar type (HCC)    Substance abuse (HCC)    Past Surgical History:  Past Surgical History:  Procedure Laterality Date   FINGER SURGERY     IR IMAGING GUIDED PORT INSERTION  11/04/2022   VIDEO BRONCHOSCOPY WITH ENDOBRONCHIAL ULTRASOUND Left 10/11/2022   Procedure: VIDEO BRONCHOSCOPY WITH ENDOBRONCHIAL ULTRASOUND;  Surgeon: Raechel Chute, MD;  Location: [facility] ORS;  Service: Pulmonary;  Laterality: Left;   HPI:  Pt is a 62 y.o. male who presented secondary to becoming poorly responsive. EMS noted pt to have right gaze deviation and jerking of upper extremities concerning for seizures; pt loaded with  Keppra and Ativan. CT head 6/20: Hyperdense lesion in the posterior aspect of the right temporal lobe suspicious for underlying cavernoma. MRI pending at time of evaluation. CT chest 6/20: Likely debris within the trachea and both mainstem bronchi suggesting aspiration. This was also noted on 6/2. PMH: metastatic lung cancer on palliative chemoradiotherapy, HTN, HLD, COPD, provoked seizures in the setting of electrolyte imbalance and on Keppra 500 BID who was at his baseline. BSE and treatment 6/2 and 6/3 and a regular texture diet with thin liquids was ultimately recommended   Assessment / Plan / Recommendation Clinical Impression  Pt presents with a cognitive-linguistic impairment with noted deficits in attention, short-term memory, problem solving, executive functioning, and expressive/receptive language.  Evaluation may be impacted by abnormal lab values.  Patient was noted to exhibit language of confusion and perseveration of phrases including "I'm not going to hurt you" in addition to mild agitation.  Once calmed, he was able to participate in simple conversational speech and stated that he lives with his sister and nephew and that his sister assists him with IADLs and drives him around town.  He was oriented to self, day, place, and city.  When SLP moved towards the end of the patient's bed, he again began to exhibit mild agitation with perseverative phrases and asked SLP to leave.  Recommend continued cognitive-linguistic assessment as lab values normalize.  Additionally recommend frequent supervision and assistance with IADLs.  SLP will f/u per POC.    SLP Assessment  SLP Visit Diagnosis: Dysphagia, unspecified (R13.10)    Recommendations for follow up therapy are one component of a multi-disciplinary discharge  planning process, led by the attending physician.  Recommendations may be updated based on patient status, additional functional criteria and insurance authorization.    Follow Up  Recommendations  Long-term institutional care without follow-up therapy    Assistance Recommended at Discharge  Frequent or constant Supervision/Assistance  Functional Status Assessment Patient has had a recent decline in their functional status and demonstrates the ability to make significant improvements in function in a reasonable and predictable amount of time.  Frequency and Duration min 2x/week  2 weeks      SLP Evaluation Cognition  Overall Cognitive Status: Impaired/Different from baseline Arousal/Alertness: Awake/alert Orientation Level: Oriented to person;Oriented to place;Oriented to time;Disoriented to situation Attention: Focused;Sustained Focused Attention: Impaired Focused Attention Impairment: Verbal basic Sustained Attention: Impaired Sustained Attention Impairment: Verbal basic Memory: Impaired Memory Impairment: Decreased short term memory;Decreased recall of new information Decreased Short Term Memory: Verbal basic Awareness: Impaired Awareness Impairment: Emergent impairment Problem Solving: Impaired Problem Solving Impairment: Verbal basic;Functional basic Executive Function: Reasoning Reasoning: Impaired Reasoning Impairment: Verbal basic Behaviors: Perseveration;Confabulation Safety/Judgment: Impaired       Comprehension  Auditory Comprehension Overall Auditory Comprehension:  (Unable to fully assess) Conversation: Simple Interfering Components: Attention (frustration)    Expression Expression Primary Mode of Expression: Verbal Verbal Expression Overall Verbal Expression:  (difficult to fully assess) Initiation: No impairment Level of Generative/Spontaneous Verbalization: Sentence Pragmatics: Impairment Impairments: Turn Taking;Topic maintenance;Topic appropriateness Interfering Components: Attention   Oral / Motor  Oral Motor/Sensory Function Overall Oral Motor/Sensory Function:  (declined) Motor Speech Overall Motor Speech: Appears within  functional limits for tasks assessed           Eino Farber, M.S., CCC-SLP Acute Rehabilitation Services Office: [phone]  Shanon Rosser [facility] 03/16/2023, 1:49 PM

## 2023-03-16 NOTE — Progress Notes (Signed)
Neurology Progress Note  Subjective: - Agitated after steroid initiation - Denies headache or other complaints  Exam: Current vital signs: BP (!) 152/87 (BP Location: Left Arm)   Pulse (!) 115   Temp 98.5 F (36.9 C) (Axillary)   Resp 18   Ht 5\' 9"  (1.753 m)   SpO2 98%   BMI 31.51 kg/m  Vital signs in last 24 hours: Temp:  [98 F (36.7 C)-98.6 F (37 C)] 98.5 F (36.9 C) (06/23 0802) Pulse Rate:  [90-115] 115 (06/23 0802) Resp:  [16-20] 18 (06/23 0802) BP: (152-173)/(82-94) 152/87 (06/23 0802) SpO2:  [95 %-99 %] 98 % (06/23 0802)  Gen: Laying in the dark,  Psych: Irritable but redirectable Resp: non-labored breathing, no grossly audible wheezing, 2 L [place] in place Cardiac: Perfusing extremities well  Abd: soft, nt  Neuro: MS: Awake, alert. Doesn't recognize me today, wants to go home to his friends and cat.  CN: Control and instrumentation engineer, pupils difficult to assess in the dark ([place] avoided to avoid agitation) Motor: Uses bilateral hands to bring water to his mouth and drink, grossly equal. Declines strength exam Sensory: Intact to Ohanian touch in bilateral feet,  Pertinent Labs:  Basic Metabolic Panel: Recent Labs  Lab 03/13/23 2201 03/14/23 0636 03/14/23 0754 03/15/23 0442 03/16/23 0622  NA 127* 133*  --  133* 133*  K 3.8 3.4*  --  3.4* 3.4*  CL 91* 91*  --  98 96*  CO2 28 27  --  27 25  GLUCOSE 129* 102*  --  94 108*  BUN 7 <5*  --  <5* 5*  CREATININE 0.57* 0.54*  --  0.50* 0.71  CALCIUM 8.3* 9.3  --  8.9 9.4  MG 1.3*  --  1.7 1.6* 1.6*    CBC: Recent Labs  Lab 03/13/23 2201 03/14/23 0636 03/15/23 0442 03/16/23 0622  WBC 6.2 6.2 5.9 7.4  NEUTROABS 3.7  --   --  5.7  HGB 10.7* 10.9* 11.0* 11.7*  HCT 32.6* 32.6* 33.0* 34.6*  MCV 91.6 87.6 88.0 86.7  PLT 265 293 269 294    Coagulation Studies: No results for input(s): "LABPROT", "INR" in the last 72 hours.   EEG 03/14/2023: "This study showed evidence of epileptogenicity and cortical dysfunction  arising fromright frontal region. Additionally there is moderate diffuse encephalopathy, nonspecific etiology. No seizures were seen throughout the recording."  Imaging reviewed:  CTA head and neck w+wo + CT venogram head 03/15/23: 1. Moderate to severe narrowing in a proximal left M2 branch. 2. No intracranial large vessel occlusion. 3. No hemodynamically significant stenosis in the neck. 4. No evidence of dural venous sinus thrombosis. 5. Redemonstrated hypodense lesion in the lateral right frontal lobe, new compared to 02/23/2023. Redemonstrated hyperdense lesion in the posterior right temporal lobe with slight interval increase in surrounding vasogenic edema. There may be an additional contrast-enhancing lesion in the posterior left temporal lobe. Recommend brain MRI with and without contrast for further evaluation and to assess for the possibilty of intracranial metastatic disease. 6. Redemonstrated are multiple sclerotic regions in the cervical and visualized thoracic vertebral bodies, unchanged from prior exam, concerning for metastatic disease. 7. See recent prior CT chest for intrathoracic findings. 8. Emphysema.  Impression: Breakthrough seizure potentially in the setting of hypomagnesemia and hyponatremia. Imaging concerning for metastatic disease more than stroke given history.   Exam today not concerning for other causes of delirium, agree likely secondary to steroids. Will discontinue. Otherwise, plan unchanged.   Recommendations:  # Concern  for breakthrough seizure - Continue Keppra 1000 mg BID (increased from home dose)  Estimated Creatinine Clearance: 112.6 mL/min (by C-G formula based on SCr of 0.71 mg/dL).  - Appreciate ongoing workup of etiology of electrolyte derangements per primary team, noting that he will be at continued risk for breakthrough seizures if he has recurrent electrolyte derangements in the future  # Hypodensity on head CT; CTA and CTV imaging concerning  for brain metastasis - Dexamethasone 4 mg daily discontinued due to agitation - MRI brain with and without contrast unable to be obtained due to patient's significant kyphosis  - Attending MD discussed case with Dr. Barbaraann Cao with neuro-oncology: will be discussed in tumor board, outpatient follow-up with Dr. Barbaraann Cao, repeat CT w/ contrast to monitor lesions on an outpatient basis  Neurology will sign off, please reach out if other questions or concerns arise  [name], AGACNP-BC [facility] Pager: [phone]  Attending Neurologist's note:  I personally saw this patient, gathering history, performing a full neurologic examination, reviewing relevant labs, personally reviewing relevant imaging including CTA CTV, and formulated the assessment and plan, adding the note above for completeness and clarity to accurately reflect my thoughts  [name] MD-PhD [facility] [phone] Available 7 AM to 7 PM, outside these hours please contact Neurologist on call listed on AMION

## 2023-03-16 NOTE — Progress Notes (Signed)
Patient is refusing to let us check his blood pressure, his temp and refusing to take his medication. He yells for [place] not to turn on the [place], yell to not touch him and make sure to close the door. "Just leave me alone"

## 2023-03-16 NOTE — Progress Notes (Signed)
Speech Language Pathology Treatment: Dysphagia  Patient Details Name: Trevor Montgomery MRN: [id number] DOB: 1961/03/01 Today's Date: 03/16/2023 Time: 7829-5621 SLP Time Calculation (min) (ACUTE ONLY): 10 min  Assessment / Plan / Recommendation Clinical Impression  Pt was seen for skilled ST targeting diet tolerance and readiness for clinical upgrade.  Pt was intermittently confused throughout this session, but was agreeable to consume thin liquid and mechanical soft solids from lunch meal tray.  He exhibited timely mastication with good oral clearance and no overt s/sx of aspiration were observed with thin liquid or solids.  Recommend diet change to Dysphagia 3 (mechanical soft) solids and thin liquids with medication administered crushed in puree (may be given whole if small pills).  SLP will f/u to monitor diet tolerance.    HPI HPI: Pt is a 62 y.o. male who presented secondary to becoming poorly responsive. EMS noted pt to have right gaze deviation and jerking of upper extremities concerning for seizures; pt loaded with Keppra and Ativan. CT head 6/20: Hyperdense lesion in the posterior aspect of the right temporal lobe suspicious for underlying cavernoma. MRI pending at time of evaluation. CT chest 6/20: Likely debris within the trachea and both mainstem bronchi suggesting aspiration. This was also noted on 6/2. PMH: metastatic lung cancer on palliative chemoradiotherapy, HTN, HLD, COPD, provoked seizures in the setting of electrolyte imbalance and on Keppra 500 BID who was at his baseline. BSE and treatment 6/2 and 6/3 and a regular texture diet with thin liquids was ultimately recommended      SLP Plan  Continue with current plan of care      Recommendations for follow up therapy are one component of a multi-disciplinary discharge planning process, led by the attending physician.  Recommendations may be updated based on patient status, additional functional criteria and insurance authorization.     Recommendations  Diet recommendations: Thin liquid;Dysphagia 3 (mechanical soft) Liquids provided via: Straw;Cup Medication Administration: Crushed with puree Supervision: Full supervision/cueing for compensatory strategies Compensations: Slow rate;Small sips/bites Postural Changes and/or Swallow Maneuvers: Seated upright 90 degrees                  Oral care BID   Frequent or constant Supervision/Assistance Dysphagia, unspecified (R13.10)     Continue with current plan of care    Trevor Montgomery, M.S., CCC-SLP Acute Rehabilitation Services Office: [phone]  Shanon Rosser [facility]  03/16/2023, 1:30 PM

## 2023-03-17 ENCOUNTER — Encounter: Payer: Self-pay | Admitting: *Deleted

## 2023-03-17 DIAGNOSIS — F209 Schizophrenia, unspecified: Secondary | ICD-10-CM | POA: Diagnosis not present

## 2023-03-17 DIAGNOSIS — E871 Hypo-osmolality and hyponatremia: Secondary | ICD-10-CM | POA: Diagnosis not present

## 2023-03-17 DIAGNOSIS — I639 Cerebral infarction, unspecified: Secondary | ICD-10-CM | POA: Diagnosis not present

## 2023-03-17 DIAGNOSIS — R569 Unspecified convulsions: Secondary | ICD-10-CM | POA: Diagnosis not present

## 2023-03-17 DIAGNOSIS — C3490 Malignant neoplasm of unspecified part of unspecified bronchus or lung: Secondary | ICD-10-CM

## 2023-03-17 LAB — CBC WITH DIFFERENTIAL/PLATELET
Abs Immature Granulocytes: 0.03 10*3/uL (ref 0.00–0.07)
Basophils Absolute: 0 10*3/uL (ref 0.0–0.1)
Basophils Relative: 0 %
Eosinophils Absolute: 0.1 10*3/uL (ref 0.0–0.5)
Eosinophils Relative: 1 %
HCT: 37.8 % — ABNORMAL LOW (ref 39.0–52.0)
Hemoglobin: 13.2 g/dL (ref 13.0–17.0)
Immature Granulocytes: 0 %
Lymphocytes Relative: 16 %
Lymphs Abs: 1.5 10*3/uL (ref 0.7–4.0)
MCH: 30.4 pg (ref 26.0–34.0)
MCHC: 34.9 g/dL (ref 30.0–36.0)
MCV: 87.1 fL (ref 80.0–100.0)
Monocytes Absolute: 1.1 10*3/uL — ABNORMAL HIGH (ref 0.1–1.0)
Monocytes Relative: 11 %
Neutro Abs: 7 10*3/uL (ref 1.7–7.7)
Neutrophils Relative %: 72 %
Platelets: 318 10*3/uL (ref 150–400)
RBC: 4.34 MIL/uL (ref 4.22–5.81)
RDW: 12.8 % (ref 11.5–15.5)
WBC: 9.8 10*3/uL (ref 4.0–10.5)
nRBC: 0 % (ref 0.0–0.2)

## 2023-03-17 LAB — BASIC METABOLIC PANEL
Anion gap: 16 — ABNORMAL HIGH (ref 5–15)
BUN: 8 mg/dL (ref 6–20)
CO2: 24 mmol/L (ref 22–32)
Calcium: 9.9 mg/dL (ref 8.9–10.3)
Chloride: 92 mmol/L — ABNORMAL LOW (ref 98–111)
Creatinine, Ser: 0.54 mg/dL — ABNORMAL LOW (ref 0.61–1.24)
GFR, Estimated: >60 mL/min (ref >60)
Glucose, Bld: 96 mg/dL (ref 70–99)
Potassium: 3.3 mmol/L — ABNORMAL LOW (ref 3.5–5.1)
Sodium: 132 mmol/L — ABNORMAL LOW (ref 135–145)

## 2023-03-17 LAB — CULTURE, BLOOD (ROUTINE X 2): Culture: NO GROWTH

## 2023-03-17 LAB — MAGNESIUM: Magnesium: 1.5 mg/dL — ABNORMAL LOW (ref 1.7–2.4)

## 2023-03-17 MED ORDER — MAGNESIUM OXIDE -MG SUPPLEMENT 400 (240 MG) MG PO TABS
800.0000 mg | ORAL_TABLET | Freq: Two times a day (BID) | ORAL | Status: DC
  Administered 2023-03-17 – 2023-03-24 (×14): 800 mg via ORAL
  Filled 2023-03-17 (×14): qty 2

## 2023-03-17 MED ORDER — LISINOPRIL 20 MG PO TABS
20.0000 mg | ORAL_TABLET | Freq: Every day | ORAL | Status: DC
  Administered 2023-03-17 – 2023-03-20 (×4): 20 mg via ORAL
  Filled 2023-03-17 (×4): qty 1

## 2023-03-17 MED ORDER — POTASSIUM CHLORIDE 20 MEQ PO PACK: 40.0000 meq | PACK | ORAL | Status: DC

## 2023-03-17 MED ORDER — POTASSIUM CHLORIDE 10 MEQ/100ML IV SOLN
10.0000 meq | INTRAVENOUS | Status: AC
  Administered 2023-03-17 (×5): 10 meq via INTRAVENOUS
  Filled 2023-03-17 (×5): qty 100

## 2023-03-17 MED ORDER — MAGNESIUM SULFATE 50 % IJ SOLN
6.0000 g | Freq: Once | INTRAVENOUS | Status: AC
  Administered 2023-03-17: 6 g via INTRAVENOUS
  Filled 2023-03-17: qty 12

## 2023-03-17 NOTE — Progress Notes (Signed)
Physical Therapy Treatment Patient Details Name: Trevor Montgomery MRN: [id number] DOB: 1960-12-18 Today's Date: 03/17/2023   History of Present Illness 62 y.o. male presents to [facility] hospital on 03/13/2023 with concern for possible seizures. 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 PMH includes metastatic lung cancer, HTN, HLD, COPD, seizures.    PT Comments    Pt with regression towards his physical therapy goals; limited by agitation and cognition this session. Pt clutching onto blanket tightly and did not engage in attempts for participation despite offers of food or ADL's (using bathroom, washing face, etc). Pt continuously talking, speaking of Satan and closing door. Heavily resistant of attempts to bring him to edge of bed and lying himself back down. +2 for repositioning in the bed. Able to follow 1 step commands, but refusing any out of bed activities. Will re-assess.    Recommendations for follow up therapy are one component of a multi-disciplinary discharge planning process, led by the attending physician.  Recommendations may be updated based on patient status, additional functional criteria and insurance authorization.  Follow Up Recommendations       Assistance Recommended at Discharge Frequent or constant Supervision/Assistance  Patient can return home with the following A lot of help with walking and/or transfers;A lot of help with bathing/dressing/bathroom;Direct supervision/assist for medications management;Direct supervision/assist for financial management;Assist for transportation;Help with stairs or ramp for entrance   Equipment Recommendations  None recommended by PT    Recommendations for Other Services       Precautions / Restrictions Precautions Precautions: Fall Precaution Comments: seizures Restrictions Weight Bearing Restrictions: No     Mobility  Bed Mobility Overal bed mobility: Needs Assistance Bed Mobility: Supine to Sit, Sit to  Supine     Supine to sit: Total assist, +2 for physical assistance Sit to supine: Min assist, +2 for physical assistance   General bed mobility comments: Pt requiring totalA + 2 to transition to edge of bed on two separate attempts due to heavy resistance. MinA for return of LE's fully onto bed    Transfers                   General transfer comment: unable due to pt resistance    Ambulation/Gait                   Stairs             Wheelchair Mobility    Modified Rankin (Stroke Patients Only)       Balance                                            Cognition Arousal/Alertness: Awake/alert Behavior During Therapy: Agitated Overall Cognitive Status: Impaired/Different from baseline Area of Impairment: Attention, Following commands, Safety/judgement, Awareness, Problem solving                   Current Attention Level: Focused   Following Commands: Follows one step commands inconsistently Safety/Judgement: Decreased awareness of safety Awareness: Intellectual Problem Solving: Slow processing, Difficulty sequencing, Decreased initiation, Requires verbal cues, Requires tactile cues General Comments: Pt clutching tightly onto blanket, unable to remove from pt with distraction of drink, cool washcloth. attempted to engage pt with music and offers to take to bathroom and/or eat breakfast, however, pt perseverating on Satan and to "leave and shut the door." Pt able  to follow commands to grasp, thumbs up, high five, however, refusing any attempts to remove blankets or sit up on edge of bed. Reporting he is cold but does not allow therapist to don socks, kicking with legs to remove.        Exercises      General Comments        Pertinent Vitals/Pain Pain Assessment Pain Assessment: Faces Faces Pain Scale: No hurt    Home Living                          Prior Function            PT Goals (current goals  can now be found in the care plan section) Acute Rehab PT Goals Potential to Achieve Goals: Fair Progress towards PT goals: Not progressing toward goals - comment (limited by behaviors)    Frequency    Min 3X/week      PT Plan Current plan remains appropriate    Co-evaluation PT/OT/SLP Co-Evaluation/Treatment: Yes Reason for Co-Treatment: Necessary to address cognition/behavior during functional activity PT goals addressed during session: Mobility/safety with mobility        AM-PAC PT "6 Clicks" Mobility   Outcome Measure  Help needed turning from your back to your side while in a flat bed without using bedrails?: Total Help needed moving from lying on your back to sitting on the side of a flat bed without using bedrails?: Total Help needed moving to and from a bed to a chair (including a wheelchair)?: Total Help needed standing up from a chair using your arms (e.g., wheelchair or bedside chair)?: Total Help needed to walk in hospital room?: Total Help needed climbing 3-5 steps with a railing? : Total 6 Click Score: 6    End of Session   Activity Tolerance: Treatment limited secondary to agitation Patient left: in bed;with call bell/phone within reach;with bed alarm set Nurse Communication: Mobility status PT Visit Diagnosis: Other abnormalities of gait and mobility (R26.89);Muscle weakness (generalized) (M62.81);Unsteadiness on feet (R26.81)     Time: 1308-6578 PT Time Calculation (min) (ACUTE ONLY): 25 min  Charges:  $Therapeutic Activity: 8-22 mins                     Lillia Pauls, PT, DPT Acute Rehabilitation Services Office [phone]    [name] 03/17/2023, 10:25 AM

## 2023-03-17 NOTE — Progress Notes (Signed)
PROGRESS NOTE    Trevor Montgomery  ZOX:[id number] DOB: Sep 16, 1961 DOA: 03/14/2023 PCP: Barbette Reichmann, MD    No chief complaint on file.   Brief Narrative:  Patient is a 62 year old gentleman history of metastatic lung cancer on palliative chemo radiation, hypertension hyperlipidemia COPD, history of provoked seizures in the setting of electrolyte imbalance on Keppra 500 twice daily noted at his baseline. Patient noted to be cooking and subsequently slumped over was poorly responsive with a rightward gaze. EMS called patient noted to have a right gaze deviation with jerking upper extremities concerning for seizure-like activity and given Versed on arrival to [facility] ED. On presentation to [facility] ED patient had persistent right gaze deviation received Keppra 4 g and Ativan 4 mg with resultant improvement with right gaze deviation however remains significantly obtunded. 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 Neurology consulted and patient transferred to [facility] for continuous EEG for further evaluation.    Assessment & Plan:   Principal Problem:   Seizure (HCC) Active Problems:   Aspiration pneumonia (HCC)   Hyponatremia   Schizophrenia (HCC)   Hypertension   Metastatic lung cancer (metastasis from lung to other site) (HCC)   Hypokalemia   Chronic obstructive lung disease (HCC)   Chronic hypoxemic respiratory failure (HCC)   Hypomagnesemia   Cerebrovascular accident (CVA) (HCC)  1.  Breakthrough seizures -Patient presented with breakthrough seizures, recent admission for seizures 02/23/2023. -Per neurology breakthrough seizures potentially likely secondary to electrolyte abnormalities in the setting of hypomagnesemia, hyponatremia. -CT head done with hyperdense lesion in the right temporal lobe suspicious for underlying  cavernoma.  CT head also with concerns for superior aspect of right cerebellum concerning for subacute ischemia. -Patient noted with prior history of seizures secondary to electrolyte abnormalities. -Continuous EEG done.  -MRI ordered however canceled as unable to get due to significant kyphosis.  It is noted that during last hospitalization MRI was unable to be obtained as well due to significant kyphosis.  -CT angiogram head and neck ordered as well as CT venogram ordered per neurology and per neurology images concerning for metastatic disease to the brain.. -Patient with no further seizure-like activity. -Continue current increased dose of Keppra 1000 mg twice daily. -Replete electrolytes. -Patient started on Decadron due to concern for brain mets however Decadron discontinued on 03/16/2023 due to concerns for agitation, confusion. -Seizure precautions, fall precautions. -Neurology was following but have signed off as of 03/16/2023.  -Outpatient follow-up with neurology.    2.  Hypomagnesemia/hypokalemia -Potassium at 3.3, magnesium at 1.5.   -Magnesium sulfate 6 g IV x 1.   -Placed on oral magnesium tablets of 800 mg p.o. twice daily.   -KCl 10 mEq IV every hour x 5 runs. -Repeat labs in the AM.    3.  Hyponatremia -Sodium noted initially on presentation at 127 improved to 133 (03/14/2023) and currently at 132 today. 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 -Continue to hold home regimen salt tablets. -Saline lock IV fluids. -Repeat labs in the AM.    4.  Aspiration pneumonia/chronic respiratory failure with hypoxia secondary to COPD -Patient chronically on 3 L home O2 at baseline. -CT chest admission concern for aspiration pneumonia. -  Sputum Gram stain and culture  pending.  -Urine Legionella antigen pending, urine pneumococcus antigen pending.  -Continue Mucinex twice daily, nebulizer treatments, IV Unasyn to complete a 5-day course of treatment, aspiration precautions.  -Patient seen by SLP.    5.  Hypertension -Continue propranolol, lisinopril.    6.  Schizophrenia -Continue home regimen Prolixin.   -Cogentin changed to twice daily as needed per psych recommendations. -If patient refuses medications could change oral Prolixin to IM Prolixin.   7.  Metastatic disease with brain mets.  Subacute ischemia/stroke ruled out.  -Concern for subacute ischemia noted on head CT in the cerebellar region. -MRI head unable to be done due to significant kyphosis.. -It is noted during prior hospitalization that MRI head was attempted however unable to be done due to patient's kyphosis. -As MRI unable to be done patient assessed by neurology who recommended and ordered CT angio head and neck for possible stroke workup, CT venogram to evaluate for possible metastatic disease/dural venous sinus thrombosis given malignant history.   -Images reviewed per neurology and imaging concerning for brain metastases. -Patient was started on dexamethasone 4 mg daily per neurology however due to agitation, confusion neurology reassessed patient and dexamethasone discontinued. -Neurology spoke with neuro-oncology, Dr. Barbaraann Cao and patient's case will be discussed at tumor board, outpatient follow-up with Dr. Barbaraann Cao with repeat CT with contrast to monitor lesions on outpatient basis. -Neurology was following but have signed off as of 03/16/2023.  8.  Metastatic lung cancer -Currently on palliative chemoradiation. -Outpatient follow-up with oncology neuro-oncology.     DVT prophylaxis: Lovenox Code Status: DNR Family Communication: Updated patient. Updated sister at bedside. Disposition: Home with home health once confusion, agitation has improved and patient at  baseline.  Status is: Inpatient    Consultants:  Neurology: Dr.Khaliqdina 03/14/2023 Psychiatry: Dr. Rebecca Eaton 03/14/2023  Procedures:  CT head 03/13/2023 CT C-spine 03/13/2023 CT chest 03/13/2023 Chest x-ray 03/13/2023 EEG 03/14/2023   Antimicrobials:  Anti-infectives (From admission, onward)    Start     Dose/Rate Route Frequency Ordered Stop   03/14/23 1130  Ampicillin-Sulbactam (UNASYN) 3 g in sodium chloride 0.9 % 100 mL IVPB        3 g 200 mL/hr over 30 Minutes Intravenous Every 6 hours 03/14/23 1034 03/19/23 2359         Subjective: Patient laying in bed with covers over her head.  Refuses to pull covers down.  Refused examination.  Not answering questions.  Sister at bedside who states patient states he thinks he is dying.  Patient noted to be talking about [name] again today per PT note.  Objective: Vitals:   03/16/23 2006 03/17/23 0011 03/17/23 0406 03/17/23 1014  BP:  (!) 168/106 (!) 153/100 (!) 146/72  Pulse:  97 91 90  Resp: 19 (!) 21 19   Temp: 98.8 F (37.1 C) 98 F (36.7 C) 97.9 F (36.6 C)   TempSrc:      SpO2: 97% 94% 95%   Height:        Intake/Output Summary (Last 24 hours) at 03/17/2023 1207 Last data filed at 03/17/2023 0335 Gross per 24 hour  Intake 214.67 ml  Output 2200 ml  Net -1985.33 ml    There were no vitals filed for this visit.  Examination:  General exam: Kyphosis.  Covering his head.  Respiratory system: Refused. Cardiovascular system: Refused. Gastrointestinal system: Refused.  Central nervous system: Alert.  Moving extremities spontaneously.  Refused exam. Extremities: Refused.   Skin: Refused.  Psychiatry: Judgement  and insight appear poor. Mood & affect appropriate.     Data Reviewed: I have personally reviewed following labs and imaging studies  CBC: Recent Labs  Lab 03/13/23 2201 03/14/23 0636 03/15/23 0442 03/16/23 0622 03/17/23 0800  WBC 6.2 6.2 5.9 7.4 9.8  NEUTROABS 3.7  --   --  5.7 7.0  HGB 10.7* 10.9*  11.0* 11.7* 13.2  HCT 32.6* 32.6* 33.0* 34.6* 37.8*  MCV 91.6 87.6 88.0 86.7 87.1  PLT 265 293 269 294 318     Basic Metabolic Panel: Recent Labs  Lab 03/13/23 2201 03/14/23 0636 03/14/23 0754 03/15/23 0442 03/16/23 0622 03/17/23 0800  NA 127* 133*  --  133* 133* 132*  K 3.8 3.4*  --  3.4* 3.4* 3.3*  CL 91* 91*  --  98 96* 92*  CO2 28 27  --  27 25 24   GLUCOSE 129* 102*  --  94 108* 96  BUN 7 <5*  --  <5* 5* 8  CREATININE 0.57* 0.54*  --  0.50* 0.71 0.54*  CALCIUM 8.3* 9.3  --  8.9 9.4 9.9  MG 1.3*  --  1.7 1.6* 1.6* 1.5*     GFR: Estimated Creatinine Clearance: 112.6 mL/min (A) (by C-G formula based on SCr of 0.54 mg/dL (L)).  Liver Function Tests: Recent Labs  Lab 03/13/23 2201 03/15/23 0442  AST 16 13*  ALT 12 12  ALKPHOS 76 73  BILITOT 0.5 0.7  PROT 6.6 5.9*  ALBUMIN 3.9 3.3*     CBG: No results for input(s): "GLUCAP" in the last 168 hours.   Recent Results (from the past 240 hour(s))  Blood culture (routine x 2)     Status: None (Preliminary result)   Collection Time: 03/13/23 10:01 PM   Specimen: BLOOD RIGHT ARM  Result Value Ref Range Status   Specimen Description BLOOD RIGHT ARM  Final   Special Requests   Final    BOTTLES DRAWN AEROBIC AND ANAEROBIC Blood Culture adequate volume   Culture   Final    NO GROWTH 4 DAYS Performed at [facility] [address], [place], [place] [address]    Report Status PENDING  Incomplete  Blood culture (routine x 2)     Status: Abnormal   Collection Time: 03/13/23 10:01 PM   Specimen: BLOOD LEFT ARM  Result Value Ref Range Status   Specimen Description   Final    BLOOD LEFT ARM Performed at [facility], [address], [place], [place] [address]    Special Requests   Final    BOTTLES DRAWN AEROBIC AND ANAEROBIC Blood Culture adequate volume Performed at [facility], [address] [place], [place] [address]    Culture  Setup Time   Final    GRAM POSITIVE RODS AEROBIC  BOTTLE ONLY CRITICAL RESULT CALLED TO, READ BACK BY AND VERIFIED WITH: MADELYN MITCHELL AT 9147 03/15/23.PMF GRAM STAIN REVIEWED-AGREE WITH RESULT    Culture (A)  Final    CORYNEBACTERIUM AMYCOLATUM Standardized susceptibility testing for this organism is not available. Performed at [facility], [address], [place], [place] [address]    Report Status 03/17/2023 FINAL  Final  SARS Coronavirus 2 by RT PCR (hospital order, performed in [facility] hospital lab) *cepheid single result test* Anterior Nasal Swab     Status: None   Collection Time: 03/13/23 11:31 PM   Specimen: Anterior Nasal Swab  Result Value Ref Range Status   SARS Coronavirus 2 by RT PCR  NEGATIVE NEGATIVE Final    Comment: (NOTE) SARS-CoV-2 target nucleic acids are NOT DETECTED.  The SARS-CoV-2 RNA is generally detectable in upper and lower respiratory specimens during the acute phase of infection. The lowest concentration of SARS-CoV-2 viral copies this assay can detect is 250 copies / mL. A negative result does not preclude SARS-CoV-2 infection and should not be used as the sole basis for treatment or other patient management decisions.  A negative result may occur with improper specimen collection / handling, submission of specimen other than nasopharyngeal swab, presence of viral mutation(s) within the areas targeted by this assay, and inadequate number of viral copies (<250 copies / mL). A negative result must be combined with clinical observations, patient history, and epidemiological information.  Fact Sheet for Patients:   RoadLapTop.co.za  Fact Sheet for Healthcare Providers: http://kim-miller.com/  This test is not yet approved or  cleared by the [place] FDA and has been authorized for detection and/or diagnosis of SARS-CoV-2 by FDA under an Emergency Use Authorization (EUA).  This EUA will remain in effect (meaning this test can be used) for  the duration of the COVID-19 declaration under Section 564(b)(1) of the Act, 21 U.S.C. section 360bbb-3(b)(1), unless the authorization is terminated or revoked sooner.  Performed at [facility], [address] [place], [place] [address]          Radiology Studies: CT ANGIO HEAD NECK W WO CM  Result Date: 03/15/2023 CLINICAL DATA:  Stroke/TIA, determine embolic source; Dural venous sinus thrombosis suspected eval for possible metastatic disease, patient too kyphotic for MRI EXAM: CT ANGIOGRAPHY HEAD AND NECK WITH AND WITHOUT CONTRAST CT VENOGRAM HEAD TECHNIQUE: Multidetector CT imaging of the head and neck was performed using the standard protocol during bolus administration of intravenous contrast. Multiplanar CT image reconstructions and MIPs were obtained to evaluate the vascular anatomy. Carotid stenosis measurements (when applicable) are obtained utilizing NASCET criteria, using the distal internal carotid diameter as the denominator. Venographic phase images of the brain were obtained following the administration of intravenous contrast. Multiplanar reformats and maximum intensity projections were generated. RADIATION DOSE REDUCTION: This exam was performed according to the departmental dose-optimization program which includes automated exposure control, adjustment of the mA and/or kV according to patient size and/or use of iterative reconstruction technique. CONTRAST:  75mL OMNIPAQUE IOHEXOL 350 MG/ML SOLN COMPARISON:  CT Head 10/12/22 FINDINGS: CT HEAD FINDINGS Brain: Redemonstrated hypodense lesion lateral right frontal lobe (series 9, image 16), new compared to 02/23/2023. Unchanged hyperdense lesion in the posterior right temporal lobe with slight interval increase in surrounding vasogenic edema (series 9, image 30, 33). There may be an additional contrast-enhancing lesion in the posterior left temporal lobe (series 21, image 105) no hydrocephalus. No extra-axial fluid  collection. Vascular: No hyperdense vessel or unexpected calcification. See below Skull: Normal. Negative for fracture or focal lesion. Sinuses/Orbits: No middle ear or mastoid effusion. Paranasal sinuses clear. Orbits are unremarkable. Other: None. Review of the MIP images confirms the above findings CTA NECK FINDINGS Aortic arch: Standard branching. Imaged portion shows no evidence of aneurysm or dissection. No significant stenosis of the major arch vessel origins. Right carotid system: No evidence of dissection, stenosis (50% or greater), or occlusion. Left carotid system: No evidence of dissection, stenosis (50% or greater), or occlusion. Vertebral arteries: Codominant. No evidence of dissection, stenosis (50% or greater), or occlusion. Skeleton: Redemonstrated are multiple sclerotic regions in the cervical and visualized thoracic vertebral bodies, unchanged from prior exam. Other neck: Negative. Upper  chest: See recent prior CT chest for intrathoracic findings. Moderate centrilobular emphysema. Right-sided central venous catheter is only partially imaged. Review of the MIP images confirms the above findings CTA HEAD FINDINGS Anterior circulation: Moderate to severe narrowing in a proximal left M2 branch (series 16, image 145). Apparent filling defect in the distal left cervical ICA at the skull base is favored to be artifactual (series 16, image 138). Posterior circulation: No significant stenosis, proximal occlusion, aneurysm, or vascular malformation. Venous sinuses: As permitted by contrast timing, patent. Anatomic variants: None Review of the MIP images confirms the above findings CT VENOGRAM FINDINGS: No evidence of dural venous sinus thrombosis. IMPRESSION: 1. Moderate to severe narrowing in a proximal left M2 branch. 2. No intracranial large vessel occlusion. 3. No hemodynamically significant stenosis in the neck. 4. No evidence of dural venous sinus thrombosis. 5. Redemonstrated hypodense lesion in the  lateral right frontal lobe, new compared to 02/23/2023. Redemonstrated hyperdense lesion in the posterior right temporal lobe with slight interval increase in surrounding vasogenic edema. There may be an additional contrast-enhancing lesion in the posterior left temporal lobe. Recommend brain MRI with and without contrast for further evaluation and to assess for the possibilty of intracranial metastatic disease. 6. Redemonstrated are multiple sclerotic regions in the cervical and visualized thoracic vertebral bodies, unchanged from prior exam, concerning for metastatic disease. 7. See recent prior CT chest for intrathoracic findings. 8. Emphysema. Emphysema (ICD10-J43.9). Electronically Signed   By: Lorenza Cambridge M.D.   On: 03/15/2023 12:46   CT VENOGRAM HEAD  Result Date: 03/15/2023 CLINICAL DATA:  Stroke/TIA, determine embolic source; Dural venous sinus thrombosis suspected eval for possible metastatic disease, patient too kyphotic for MRI EXAM: CT ANGIOGRAPHY HEAD AND NECK WITH AND WITHOUT CONTRAST CT VENOGRAM HEAD TECHNIQUE: Multidetector CT imaging of the head and neck was performed using the standard protocol during bolus administration of intravenous contrast. Multiplanar CT image reconstructions and MIPs were obtained to evaluate the vascular anatomy. Carotid stenosis measurements (when applicable) are obtained utilizing NASCET criteria, using the distal internal carotid diameter as the denominator. Venographic phase images of the brain were obtained following the administration of intravenous contrast. Multiplanar reformats and maximum intensity projections were generated. RADIATION DOSE REDUCTION: This exam was performed according to the departmental dose-optimization program which includes automated exposure control, adjustment of the mA and/or kV according to patient size and/or use of iterative reconstruction technique. CONTRAST:  75mL OMNIPAQUE IOHEXOL 350 MG/ML SOLN COMPARISON:  CT Head 10/12/22  FINDINGS: CT HEAD FINDINGS Brain: Redemonstrated hypodense lesion lateral right frontal lobe (series 9, image 16), new compared to 02/23/2023. Unchanged hyperdense lesion in the posterior right temporal lobe with slight interval increase in surrounding vasogenic edema (series 9, image 30, 33). There may be an additional contrast-enhancing lesion in the posterior left temporal lobe (series 21, image 105) no hydrocephalus. No extra-axial fluid collection. Vascular: No hyperdense vessel or unexpected calcification. See below Skull: Normal. Negative for fracture or focal lesion. Sinuses/Orbits: No middle ear or mastoid effusion. Paranasal sinuses clear. Orbits are unremarkable. Other: None. Review of the MIP images confirms the above findings CTA NECK FINDINGS Aortic arch: Standard branching. Imaged portion shows no evidence of aneurysm or dissection. No significant stenosis of the major arch vessel origins. Right carotid system: No evidence of dissection, stenosis (50% or greater), or occlusion. Left carotid system: No evidence of dissection, stenosis (50% or greater), or occlusion. Vertebral arteries: Codominant. No evidence of dissection, stenosis (50% or greater), or occlusion. Skeleton: Redemonstrated are multiple  sclerotic regions in the cervical and visualized thoracic vertebral bodies, unchanged from prior exam. Other neck: Negative. Upper chest: See recent prior CT chest for intrathoracic findings. Moderate centrilobular emphysema. Right-sided central venous catheter is only partially imaged. Review of the MIP images confirms the above findings CTA HEAD FINDINGS Anterior circulation: Moderate to severe narrowing in a proximal left M2 branch (series 16, image 145). Apparent filling defect in the distal left cervical ICA at the skull base is favored to be artifactual (series 16, image 138). Posterior circulation: No significant stenosis, proximal occlusion, aneurysm, or vascular malformation. Venous sinuses: As  permitted by contrast timing, patent. Anatomic variants: None Review of the MIP images confirms the above findings CT VENOGRAM FINDINGS: No evidence of dural venous sinus thrombosis. IMPRESSION: 1. Moderate to severe narrowing in a proximal left M2 branch. 2. No intracranial large vessel occlusion. 3. No hemodynamically significant stenosis in the neck. 4. No evidence of dural venous sinus thrombosis. 5. Redemonstrated hypodense lesion in the lateral right frontal lobe, new compared to 02/23/2023. Redemonstrated hyperdense lesion in the posterior right temporal lobe with slight interval increase in surrounding vasogenic edema. There may be an additional contrast-enhancing lesion in the posterior left temporal lobe. Recommend brain MRI with and without contrast for further evaluation and to assess for the possibilty of intracranial metastatic disease. 6. Redemonstrated are multiple sclerotic regions in the cervical and visualized thoracic vertebral bodies, unchanged from prior exam, concerning for metastatic disease. 7. See recent prior CT chest for intrathoracic findings. 8. Emphysema. Emphysema (ICD10-J43.9). Electronically Signed   By: Lorenza Cambridge M.D.   On: 03/15/2023 12:46        Scheduled Meds:  enoxaparin (LOVENOX) injection  40 mg Subcutaneous QHS   fluPHENAZine  5 mg Oral Daily   guaiFENesin  1,200 mg Oral BID   lisinopril  20 mg Oral Daily   LORazepam  0.5 mg Intravenous Once   magnesium oxide  800 mg Oral BID   pantoprazole (PROTONIX) IV  40 mg Intravenous Q24H   potassium chloride  40 mEq Oral Q4H   propranolol  5 mg Oral BID   Continuous Infusions:  ampicillin-sulbactam (UNASYN) IV 3 g (03/17/23 4098)   levETIRAcetam 1,000 mg (03/17/23 1017)   magnesium sulfate bolus IVPB       LOS: 2 days    Time spent: 40 minutes    Ramiro Harvest, MD [facility]   To contact the attending provider between 7A-7P or the covering provider during after hours 7P-7A, please log  into the web site [email or web address] and access using universal [place] password for that web site. If you do not have the password, please call the hospital operator.  03/17/2023, 12:07 PM

## 2023-03-17 NOTE — Progress Notes (Signed)
Pharmacy Antibiotic Note  Dameian Crisman is a 62 y.o. male with PMH significant for metastatic lung cancer on palliative chemoradiotherapy, concerning for admitted on 03/14/2023 with seizure activity now with concerns for aspiration pneumonia.  Pharmacy has been consulted for Unasyn dosing.  Plan: Continue Unasyn 3g IV Q6h through 6/26 Trend WBC, fever, renal function F/u cultures, clinical progress  Height: 5\' 9"  (175.3 cm) IBW/kg (Calculated) : 70.7  Temp (24hrs), Avg:98.1 F (36.7 C), Min:97.8 F (36.6 C), Max:98.8 F (37.1 C)  Recent Labs  Lab 03/13/23 2201 03/14/23 0636 03/15/23 0442 03/16/23 0622 03/17/23 0800  WBC 6.2 6.2 5.9 7.4 9.8  CREATININE 0.57* 0.54* 0.50* 0.71 0.54*  LATICACIDVEN 1.1  --   --   --   --      Estimated Creatinine Clearance: 112.6 mL/min (A) (by C-G formula based on SCr of 0.54 mg/dL (L)).    Allergies  Allergen Reactions   Fluphenazine     Other Reaction(s): Unknown    Microbiology results: 6/22 BCID: 1/4 GPRs (likely contaminant)  6/22 legionella antig - not yet collected 6/22 strep pneumo antig - not yet collected    Thank you for allowing pharmacy to be a part of this patient's care.   Signe Colt, PharmD 03/17/2023 12:40 PM  **Pharmacist phone directory can be found on [email or web address] listed under [facility]**

## 2023-03-17 NOTE — Progress Notes (Signed)
Occupational Therapy Treatment Patient Details Name: Trevor Montgomery MRN: [id number] DOB: [birth year] Today's Date: 03/17/2023   History of present illness 62 y.o. male presents to [facility] hospital on 03/13/2023 with concern for possible seizures. 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 PMH includes metastatic lung cancer, HTN, HLD, COPD, seizures.   OT comments  Limited session and progress due to increased agitation and cognition. Patient received in bed with head covered over head and hands clutching blanket. Several attempts were made to have patient participate with patient perseverating on phrases regarding bible, Trevor Montgomery, and closing door. Patient assisted to EOB with total assist of 2 and resistance from patient. Patient to continue to be followed by acute OT to address self care and functional transfers.    Recommendations for follow up therapy are one component of a multi-disciplinary discharge planning process, led by the attending physician.  Recommendations may be updated based on patient status, additional functional criteria and insurance authorization.    Assistance Recommended at Discharge Frequent or constant Supervision/Assistance  Patient can return home with the following  A lot of help with walking and/or transfers;A lot of help with bathing/dressing/bathroom   Equipment Recommendations  None recommended by OT    Recommendations for Other Services      Precautions / Restrictions Precautions Precautions: Fall Precaution Comments: seizures Restrictions Weight Bearing Restrictions: No       Mobility Bed Mobility Overal bed mobility: Needs Assistance Bed Mobility: Supine to Sit, Sit to Supine     Supine to sit: Total assist, +2 for physical assistance Sit to supine: Min assist, +2 for physical assistance   General bed mobility comments: Pt requiring totalA + 2 to transition to edge of bed on two separate attempts due to heavy resistance. MinA  for return of LE's fully onto bed    Transfers                   General transfer comment: unable due to pt resistance     Balance                                           ADL either performed or assessed with clinical judgement   ADL Overall ADL's : Needs assistance/impaired     Grooming: Moderate assistance;Bed level Grooming Details (indicate cue type and reason): resistant to care               Lower Body Dressing Details (indicate cue type and reason): Patient resistant to care, refusing to donn or allow therapist to assist               General ADL Comments: Patient resistant to care and perseverating on repeated phrases, "[name]" "Trevor Montgomery is a [employer]      Cognition Arousal/Alertness: Awake/alert Behavior During Therapy: Agitated Overall Cognitive Status: Impaired/Different from baseline Area of Impairment: Attention, Following commands, Safety/judgement, Awareness, Problem solving                   Current Attention Level: Focused   Following Commands: Follows one step commands inconsistently Safety/Judgement: Decreased awareness of safety Awareness: Intellectual Problem Solving: Slow processing, Difficulty sequencing, Decreased  initiation, Requires verbal cues, Requires tactile cues General Comments: Patient with head tucked into blanket repeating same phrases regarding bible, Trevor Montgomery, and asking to close door.        Exercises      Shoulder Instructions       General Comments      Pertinent Vitals/ Pain       Pain Assessment Pain Assessment: Faces Faces Pain Scale: No hurt Pain Intervention(s): Monitored during session  Home Living                                          Prior Functioning/Environment              Frequency  Min 2X/week        Progress Toward Goals  OT  Goals(current goals can now be found in the care plan section)  Progress towards OT goals: Progressing toward goals  Acute Rehab OT Goals Patient Stated Goal: none stated OT Goal Formulation: With patient Time For Goal Achievement: 03/29/23 Potential to Achieve Goals: Good ADL Goals Pt Will Perform Grooming: with set-up;standing Pt Will Perform Lower Body Dressing: with set-up;sit to/from stand Pt Will Transfer to Toilet: with set-up;ambulating Additional ADL Goal #1: Pt will follow one step commands consistently throughout session.  Plan Discharge plan remains appropriate    Co-evaluation    PT/OT/SLP Co-Evaluation/Treatment: Yes Reason for Co-Treatment: Necessary to address cognition/behavior during functional activity PT goals addressed during session: Mobility/safety with mobility OT goals addressed during session: ADL's and self-care      AM-PAC OT "6 Clicks" Daily Activity     Outcome Measure   Help from another person eating meals?: A Little Help from another person taking care of personal grooming?: A Little Help from another person toileting, which includes using toliet, bedpan, or urinal?: A Little Help from another person bathing (including washing, rinsing, drying)?: A Little Help from another person to put on and taking off regular upper body clothing?: A Little Help from another person to put on and taking off regular lower body clothing?: A Little 6 Click Score: 18    End of Session Equipment Utilized During Treatment: Oxygen  OT Visit Diagnosis: Other abnormalities of gait and mobility (R26.89);Other symptoms and signs involving cognitive function   Activity Tolerance Treatment limited secondary to agitation   Patient Left in bed;with call bell/phone within reach;with bed alarm set   Nurse Communication Other (comment) (agitation)        Time: 5284-1324 OT Time Calculation (min): 25 min  Charges: OT General Charges $OT Visit: 1 Visit OT  Treatments $Self Care/Home Management : 8-22 mins  Trevor Montgomery, OTA Acute Rehabilitation Services  Office [phone]   Trevor Montgomery 03/17/2023, 12:39 PM
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
## 2023-03-17 NOTE — TOC Progression Note (Signed)
Transition of Care [facility]) - Progression Note    Patient Details  Name: Trevor Montgomery MRN: [id number] Date of Birth: March 29, 1961  [facility] CM/SW Contact  Kermit Balo, RN Phone Number: 03/17/2023, 2:54 PM  Clinical Narrative:    Plan continues to be for home when medically ready.  TOC following.   Expected Discharge Plan: Home w Home Health Services Barriers to Discharge: Continued Medical Work up  Expected Discharge Plan and Services   Discharge Planning Services: CM Consult Post Acute Care Choice: Home Health Living arrangements for the past 2 months: Single Family Home                 DME Arranged: Walker rolling with seat         HH Arranged: PT, Speech Therapy           Social Determinants of Health (SDOH) Interventions SDOH Screenings   Food Insecurity: No Food Insecurity (03/14/2023)  Housing: Low Risk  (03/14/2023)  Transportation Needs: No Transportation Needs (03/14/2023)  Utilities: Not At Risk (03/14/2023)  Alcohol Screen: Low Risk  (11/11/2022)  Depression (PHQ2-9): Low Risk  (11/11/2022)  Financial Resource Strain: Low Risk  (11/11/2022)  Physical Activity: Inactive (11/11/2022)  Social Connections: Unknown (11/11/2022)  Tobacco Use: Medium Risk (03/15/2023)    Readmission Risk Interventions     No data to display

## 2023-03-17 NOTE — Plan of Care (Signed)

## 2023-03-18 DIAGNOSIS — J9611 Chronic respiratory failure with hypoxia: Secondary | ICD-10-CM

## 2023-03-18 DIAGNOSIS — C3490 Malignant neoplasm of unspecified part of unspecified bronchus or lung: Secondary | ICD-10-CM

## 2023-03-18 DIAGNOSIS — R569 Unspecified convulsions: Secondary | ICD-10-CM | POA: Diagnosis not present

## 2023-03-18 DIAGNOSIS — J690 Pneumonitis due to inhalation of food and vomit: Secondary | ICD-10-CM | POA: Diagnosis not present

## 2023-03-18 DIAGNOSIS — I639 Cerebral infarction, unspecified: Secondary | ICD-10-CM | POA: Diagnosis not present

## 2023-03-18 LAB — CULTURE, BLOOD (ROUTINE X 2): Special Requests: ADEQUATE

## 2023-03-18 LAB — BASIC METABOLIC PANEL
Anion gap: 11 (ref 5–15)
BUN: 11 mg/dL (ref 6–20)
CO2: 24 mmol/L (ref 22–32)
Calcium: 9.4 mg/dL (ref 8.9–10.3)
Chloride: 96 mmol/L — ABNORMAL LOW (ref 98–111)
Creatinine, Ser: 0.57 mg/dL — ABNORMAL LOW (ref 0.61–1.24)
GFR, Estimated: >60 mL/min (ref >60)
Glucose, Bld: 89 mg/dL (ref 70–99)
Potassium: 3.3 mmol/L — ABNORMAL LOW (ref 3.5–5.1)
Sodium: 131 mmol/L — ABNORMAL LOW (ref 135–145)

## 2023-03-18 LAB — MAGNESIUM: Magnesium: 1.9 mg/dL (ref 1.7–2.4)

## 2023-03-18 MED ORDER — SIMVASTATIN 20 MG PO TABS
20.0000 mg | ORAL_TABLET | Freq: Every day | ORAL | Status: DC
  Administered 2023-03-18: 20 mg via ORAL
  Filled 2023-03-18: qty 1

## 2023-03-18 MED ORDER — POTASSIUM CHLORIDE CRYS ER 20 MEQ PO TBCR: 20.0000 meq | EXTENDED_RELEASE_TABLET | Freq: Two times a day (BID) | ORAL | Status: DC

## 2023-03-18 MED ORDER — HALOPERIDOL LACTATE 5 MG/ML IJ SOLN: 2.0000 mg | Freq: Four times a day (QID) | INTRAMUSCULAR | Status: DC | PRN

## 2023-03-18 MED ORDER — METOPROLOL TARTRATE 5 MG/5ML IV SOLN: 5.0000 mg | Freq: Once | INTRAVENOUS | Status: DC

## 2023-03-18 MED ORDER — POTASSIUM CHLORIDE CRYS ER 20 MEQ PO TBCR
20.0000 meq | EXTENDED_RELEASE_TABLET | Freq: Two times a day (BID) | ORAL | Status: DC
  Administered 2023-03-19 – 2023-03-24 (×11): 20 meq via ORAL
  Filled 2023-03-18 (×12): qty 1

## 2023-03-18 MED ORDER — MAGNESIUM SULFATE 2 GM/50ML IV SOLN
2.0000 g | Freq: Once | INTRAVENOUS | Status: AC
  Administered 2023-03-18: 2 g via INTRAVENOUS
  Filled 2023-03-18: qty 50

## 2023-03-18 MED ORDER — IPRATROPIUM-ALBUTEROL 0.5-2.5 (3) MG/3ML IN SOLN: 3.0000 mL | RESPIRATORY_TRACT | Status: DC | PRN

## 2023-03-18 MED ORDER — PANTOPRAZOLE SODIUM 40 MG PO TBEC
40.0000 mg | DELAYED_RELEASE_TABLET | Freq: Two times a day (BID) | ORAL | Status: DC
  Administered 2023-03-18 – 2023-03-24 (×12): 40 mg via ORAL
  Filled 2023-03-18 (×13): qty 1

## 2023-03-18 MED ORDER — SODIUM CHLORIDE 1 G PO TABS
1.0000 g | ORAL_TABLET | Freq: Three times a day (TID) | ORAL | Status: DC
  Administered 2023-03-18 – 2023-03-24 (×19): 1 g via ORAL
  Filled 2023-03-18 (×20): qty 1

## 2023-03-18 MED ORDER — POTASSIUM CHLORIDE CRYS ER 10 MEQ PO TBCR
40.0000 meq | EXTENDED_RELEASE_TABLET | Freq: Once | ORAL | Status: AC
  Administered 2023-03-18: 40 meq via ORAL
  Filled 2023-03-18: qty 4

## 2023-03-18 MED ORDER — POTASSIUM CHLORIDE 10 MEQ/100ML IV SOLN
10.0000 meq | INTRAVENOUS | Status: DC
  Administered 2023-03-18 (×2): 10 meq via INTRAVENOUS
  Filled 2023-03-18 (×3): qty 100

## 2023-03-18 NOTE — Progress Notes (Signed)
PT Cancellation Note  Patient Details Name: Trevor Montgomery MRN: [id number] DOB: 09/03/1961   Cancelled Treatment:    Reason Eval/Treat Not Completed: Other (comment) Pt declining all attempts for out of bed mobility, stating he does not want to walk and he needs to stay in bed and heal.  Lillia Pauls, PT, DPT Acute Rehabilitation Services Office [phone]    [name] 03/18/2023, 4:38 PM

## 2023-03-18 NOTE — Care Management Important Message (Signed)
Important Message  Patient Details  Name: Trevor Montgomery MRN: [id number] Date of Birth: Oct 27, 1960   Medicare Important Message Given:  Yes     Carlie [place] 03/18/2023, 3:07 PM

## 2023-03-18 NOTE — Progress Notes (Signed)
Marylene Land, the CPhT, reached out to me to inform me that she was speaking to the pt, and he told her some stories and followed that up by telling her he was trying to kill himself while he has been in the hospital. He said that was why he was not eating and refusing meds. He told her he would eat if he could get some cold water so she reported it to me and I immediately went to assess him, as I had not been told that at all by him and he even denied it to me.  I also notified Dr Janee Morn and my manager, Luna Kitchens.  Marylene Land reached out to me and said she could come to speak with me about what he said to her.  She did come to the bedside and repeated what he said to her and he even denied it then.  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  He is cooperative and stays in bed resting and does not want anyone to bother him, but he has not appear suicidal at all.  He keeps saying he loves everyone and wants to live and get better so he can go home.  He even said he feels stronger today and is eating and wants to stay in the hospital until he gets strong enough to go home.  He did eat some of his breakfast today along with drank 2 Ensure shakes because he did not want to eat lunch. He does perseverate about Jesus and being saved so we do not die but he is not combative, harmful to himself or others, or any of that.  He is adamant that he does not wish to harm himself or others and said she misunderstood what he said.  He even said if you kill yourself, you cannot go to [facility] and he definitely wants to go to [place] when he dies.  I had a discussion with the doctor and my Production designer, theatre/television/film.  Doctor Janee Morn said he would talk with the psych MD or personnel and get back with me.

## 2023-03-18 NOTE — Progress Notes (Signed)
I am at his bedside and he has been calm and cooperative since his sister left, without agitation.  He does keep repeating himself and talking about Jesus saving everyone and asking [place] if we want to go to heaven and if we believe God will save [place].  That is the same conversation he has been having for the last 2 days I have been his nurse and the previous nurse said he has been doing that for days.  He keeps saying he loves EVERYONE and he wants to take everyone to heaven with him.

## 2023-03-18 NOTE — Progress Notes (Addendum)
PROGRESS NOTE    Trevor Montgomery  RUE:[id number] DOB: 29-Sep-1960 DOA: 03/14/2023 PCP: Barbette Reichmann, MD    No chief complaint on file.   Brief Narrative:  Patient is a 62 year old gentleman history of metastatic lung cancer on palliative chemo radiation, hypertension hyperlipidemia COPD, history of provoked seizures in the setting of electrolyte imbalance on Keppra 500 twice daily noted at his baseline. Patient noted to be cooking and subsequently slumped over was poorly responsive with a rightward gaze. EMS called patient noted to have a right gaze deviation with jerking upper extremities concerning for seizure-like activity and given Versed on arrival to [facility] ED. On presentation to [facility] ED patient had persistent right gaze deviation received Keppra 4 g and Ativan 4 mg with resultant improvement with right gaze deviation however remains significantly obtunded. 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 Neurology consulted and patient transferred to [facility] for continuous EEG for further evaluation.    Assessment & Plan:   Principal Problem:   Seizure (HCC) Active Problems:   Aspiration pneumonia (HCC)   Hyponatremia   Schizophrenia (HCC)   Hypertension   Metastatic lung cancer (metastasis from lung to other site) (HCC)   Hypokalemia   Chronic obstructive lung disease (HCC)   Chronic hypoxemic respiratory failure (HCC)   Hypomagnesemia   Cerebrovascular accident (CVA) (HCC)  1.  Breakthrough seizures -Patient presented with breakthrough seizures, recent admission for seizures 02/23/2023. -Per neurology breakthrough seizures potentially likely secondary to electrolyte abnormalities in the setting of hypomagnesemia, hyponatremia. -CT head done with hyperdense lesion in the right temporal lobe suspicious for underlying  cavernoma.  CT head also with concerns for superior aspect of right cerebellum concerning for subacute ischemia. -Patient noted with prior history of seizures secondary to electrolyte abnormalities. -Continuous EEG done.  -MRI ordered however canceled as unable to get due to significant kyphosis.  It is noted that during last hospitalization MRI was unable to be obtained as well due to significant kyphosis.  -CT angiogram head and neck ordered as well as CT venogram ordered per neurology and per neurology images concerning for metastatic disease to the brain.. -Patient with no further seizure-like activity. -Continue current increased dose of Keppra 1000 mg twice daily. -Replete electrolytes. -Patient started on Decadron due to concern for brain mets however Decadron discontinued on 03/16/2023 due to concerns for agitation, confusion. -Seizure precautions, fall precautions. -Neurology was following but have signed off as of 03/16/2023.  -Outpatient follow-up with neurology.    2.  Hypomagnesemia/hypokalemia -Potassium at 3.3 today, magnesium at 1.9.   -Continue oral magnesium tablets of 800 mg p.o. twice daily.   -Replete potassium.   -Repeat labs in the AM.    3.  Hyponatremia -Sodium noted initially on presentation at 127 improved to 133 (03/14/2023) and slowly trending back down currently at 131 today.  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 -Will resume home regimen salt tablets today. -Saline lock IV fluids. -Repeat labs in the AM.    4.  Aspiration pneumonia/chronic respiratory failure with hypoxia secondary to COPD -Patient chronically on 3 L home O2 at baseline. -CT chest admission concern for aspiration pneumonia. -Sputum Gram stain and culture pending.  -Urine  Legionella antigen pending, urine pneumococcus antigen pending.  -Continue Mucinex twice daily, nebulizer treatments, IV Unasyn to complete a 5-day course of treatment, aspiration precautions.  -Patient seen by SLP.    5.  Hypertension -Continue lisinopril, propranolol.    6.  Schizophrenia -Continue home regimen Prolixin.   -Cogentin changed to twice daily as needed per psych recommendations. -If patient refuses medications could change oral Prolixin to IM Prolixin. -Was notified by RN that patient with suicidal ideations the afternoon, 03/18/2023 as relayed to her by pharmacy tech. -RN went and reassessed patient and states patient with no suicidal ideations at this time. -Initial consult for psych was placed for suicidal ideation however with RNs follow-up will DC psych consult for now. -IV Haldol as needed.   7.  Metastatic disease with brain mets.  Subacute ischemia/stroke ruled out.  -Initial concern for subacute ischemia noted on head CT in the cerebellar region. -MRI head unable to be done due to significant kyphosis.. -It is noted during prior hospitalization that MRI head was attempted however unable to be done due to patient's kyphosis. -As MRI unable to be done patient assessed by neurology who recommended and ordered CT angio head and neck for possible stroke workup, CT venogram to evaluate for possible metastatic disease/dural venous sinus thrombosis given malignant history.   -Images reviewed per neurology and imaging concerning for brain metastases. -Patient was started on dexamethasone 4 mg daily per neurology however due to agitation, confusion neurology reassessed patient and dexamethasone discontinued. -Neurology spoke with neuro-oncology, Dr. Barbaraann Cao and patient's case will be discussed at tumor board, outpatient follow-up with Dr. Barbaraann Cao with repeat CT with contrast to monitor lesions on outpatient basis. -Neurology was following but have signed off as of 03/16/2023.  8.   Metastatic lung cancer -Currently on palliative chemoradiation. -Outpatient follow-up with oncology/ neuro-oncology.     DVT prophylaxis: Lovenox Code Status: DNR Family Communication: Updated patient.  No family at bedside.  Disposition: Home with home health once confusion, agitation has improved and patient at baseline.  Status is: Inpatient    Consultants:  Neurology: Dr.Khaliqdina 03/14/2023 Psychiatry: Dr. Rebecca Eaton 03/14/2023  Procedures:  CT head 03/13/2023 CT C-spine 03/13/2023 CT chest 03/13/2023 Chest x-ray 03/13/2023 EEG 03/14/2023   Antimicrobials:  Anti-infectives (From admission, onward)    Start     Dose/Rate Route Frequency Ordered Stop   03/14/23 1130  Ampicillin-Sulbactam (UNASYN) 3 g in sodium chloride 0.9 % 100 mL IVPB        3 g 200 mL/hr over 30 Minutes Intravenous Every 6 hours 03/14/23 1034 03/19/23 2359         Subjective: Laying in bed.  More alert.  Following some commands.  Confusion improving however still a little bit confused.  Asking for ice water.  Alert and oriented to self and place.  Not sure of the year or the month.  Knows Duane Boston is the president.  Knows Joe Jackquline Bosch and Garnet Koyanagi are running for presidency.  Objective: Vitals:   03/18/23 0352 03/18/23 0728 03/18/23 0849 03/18/23 0900  BP: (!) 174/111 (!) 198/158 (!) 185/115 (!) 170/101  Pulse: (!) 103 (!) 106 100   Resp: 18 20 16    Temp: (!) 97.5 F (36.4 C) 97.7 F (36.5 C)    TempSrc: Oral Oral    SpO2: 96% 97%    Height:        Intake/Output Summary (Last 24 hours) at 03/18/2023 1054 Last data filed at 03/18/2023 0043 Gross per 24 hour  Intake  200 ml  Output --  Net 200 ml    There were no vitals filed for this visit.  Examination:  General exam: Kyphosis.  Respiratory system: Lungs clear to auscultation bilaterally.  No wheezes, no crackles, no rhonchi.  Cardiovascular system: Regular rate rhythm no murmurs rubs or gallops.  No JVD.  No lower extremity edema.   Gastrointestinal system: Abdomen is soft, nontender, nondistended, positive bowel sounds.  No rebound.   Central nervous system: Alert and oriented to self and place.  Moving extremities spontaneously.  No focal neurological deficits. Extremities: No clubbing cyanosis or edema. Skin: No rashes or lesions noted. Psychiatry: Judgement and insight appear poor. Mood & affect appropriate.     Data Reviewed: I have personally reviewed following labs and imaging studies  CBC: Recent Labs  Lab 03/13/23 2201 03/14/23 0636 03/15/23 0442 03/16/23 0622 03/17/23 0800  WBC 6.2 6.2 5.9 7.4 9.8  NEUTROABS 3.7  --   --  5.7 7.0  HGB 10.7* 10.9* 11.0* 11.7* 13.2  HCT 32.6* 32.6* 33.0* 34.6* 37.8*  MCV 91.6 87.6 88.0 86.7 87.1  PLT 265 293 269 294 318     Basic Metabolic Panel: Recent Labs  Lab 03/14/23 0636 03/14/23 0754 03/15/23 0442 03/16/23 0622 03/17/23 0800 03/18/23 0334  NA 133*  --  133* 133* 132* 131*  K 3.4*  --  3.4* 3.4* 3.3* 3.3*  CL 91*  --  98 96* 92* 96*  CO2 27  --  27 25 24 24   GLUCOSE 102*  --  94 108* 96 89  BUN <5*  --  <5* 5* 8 11  CREATININE 0.54*  --  0.50* 0.71 0.54* 0.57*  CALCIUM 9.3  --  8.9 9.4 9.9 9.4  MG  --  1.7 1.6* 1.6* 1.5* 1.9     GFR: Estimated Creatinine Clearance: 112.6 mL/min (A) (by C-G formula based on SCr of 0.57 mg/dL (L)).  Liver Function Tests: Recent Labs  Lab 03/13/23 2201 03/15/23 0442  AST 16 13*  ALT 12 12  ALKPHOS 76 73  BILITOT 0.5 0.7  PROT 6.6 5.9*  ALBUMIN 3.9 3.3*     CBG: No results for input(s): "GLUCAP" in the last 168 hours.   Recent Results (from the past 240 hour(s))  Blood culture (routine x 2)     Status: None   Collection Time: 03/13/23 10:01 PM   Specimen: BLOOD RIGHT ARM  Result Value Ref Range Status   Specimen Description BLOOD RIGHT ARM  Final   Special Requests   Final    BOTTLES DRAWN AEROBIC AND ANAEROBIC Blood Culture adequate volume   Culture   Final    NO GROWTH 5 DAYS Performed  at [facility], [address] [place], [place] [address]    Report Status 03/18/2023 FINAL  Final  Blood culture (routine x 2)     Status: Abnormal   Collection Time: 03/13/23 10:01 PM   Specimen: BLOOD LEFT ARM  Result Value Ref Range Status   Specimen Description   Final    BLOOD LEFT ARM Performed at [facility], [address], [place], [place] [address]    Special Requests   Final    BOTTLES DRAWN AEROBIC AND ANAEROBIC Blood Culture adequate volume Performed at [facility], [address] [place], [place] [address]    Culture  Setup Time   Final    GRAM POSITIVE RODS AEROBIC BOTTLE ONLY CRITICAL RESULT CALLED TO, READ BACK BY AND VERIFIED WITH:  MADELYN MITCHELL AT 2841 03/15/23.PMF GRAM STAIN REVIEWED-AGREE WITH RESULT    Culture (A)  Final    CORYNEBACTERIUM AMYCOLATUM Standardized susceptibility testing for this organism is not available. Performed at [facility], [address], [place], [place] [address]    Report Status 03/17/2023 FINAL  Final  SARS Coronavirus 2 by RT PCR (hospital order, performed in [facility] hospital lab) *cepheid single result test* Anterior Nasal Swab     Status: None   Collection Time: 03/13/23 11:31 PM   Specimen: Anterior Nasal Swab  Result Value Ref Range Status   SARS Coronavirus 2 by RT PCR NEGATIVE NEGATIVE Final    Comment: (NOTE) SARS-CoV-2 target nucleic acids are NOT DETECTED.  The SARS-CoV-2 RNA is generally detectable in upper and lower respiratory specimens during the acute phase of infection. The lowest concentration of SARS-CoV-2 viral copies this assay can detect is 250 copies / mL. A negative result does not preclude SARS-CoV-2 infection and should not be used as the sole basis for treatment or other patient management decisions.  A negative result may occur with improper specimen collection / handling, submission of specimen other than nasopharyngeal swab, presence of viral  mutation(s) within the areas targeted by this assay, and inadequate number of viral copies (<250 copies / mL). A negative result must be combined with clinical observations, patient history, and epidemiological information.  Fact Sheet for Patients:   RoadLapTop.co.za  Fact Sheet for Healthcare Providers: http://kim-miller.com/  This test is not yet approved or  cleared by the [place] FDA and has been authorized for detection and/or diagnosis of SARS-CoV-2 by FDA under an Emergency Use Authorization (EUA).  This EUA will remain in effect (meaning this test can be used) for the duration of the COVID-19 declaration under Section 564(b)(1) of the Act, 21 U.S.C. section 360bbb-3(b)(1), unless the authorization is terminated or revoked sooner.  Performed at [facility], [address], [place], [place] [address]          Radiology Studies: No results found.      Scheduled Meds:  enoxaparin (LOVENOX) injection  40 mg Subcutaneous QHS   fluPHENAZine  5 mg Oral Daily   guaiFENesin  1,200 mg Oral BID   lisinopril  20 mg Oral Daily   LORazepam  0.5 mg Intravenous Once   magnesium oxide  800 mg Oral BID   pantoprazole (PROTONIX) IV  40 mg Intravenous Q24H   [START ON 03/19/2023] potassium chloride SA  20 mEq Oral BID   propranolol  5 mg Oral BID   simvastatin  20 mg Oral QHS   sodium chloride  1 g Oral TID   Continuous Infusions:  ampicillin-sulbactam (UNASYN) IV 3 g (03/18/23 0553)   levETIRAcetam 1,000 mg (03/18/23 1041)   potassium chloride 10 mEq (03/18/23 1035)     LOS: 3 days    Time spent: 40 minutes    Ramiro Harvest, MD [facility]   To contact the attending provider between 7A-7P or the covering provider during after hours 7P-7A, please log into the web site [email or web address] and access using universal [facility] password for that web site. If you do not have the password, please call the  hospital operator.  03/18/2023, 10:54 AM

## 2023-03-18 NOTE — Progress Notes (Signed)
Was happy and calm all morning and now he is just as agitated as he was yesterday. His sister is here with him and he has been this way since she got here. I told her he was much better today but she said he is so much worse since she has been here. I explained that he was not that way before she came and sometimes patients get more agitated with family or friends and if it continues, she may need to leave and come back later after he calms down.  She also wants someone to look at his head because he is holding the back of his head.  He said it is not sore but he keeps holding it because that is the way he lays in bed (with his arms folded and behind his head) which is how he has been laying for the past couple days that I have been his nurse.  He has denied any pain. She has concerns. She is requesting that we give him something to calm him down but I explained that we do not want to sedate him but we need to try to find out the cause and maybe remove it. If he continues, I did tell her lets have her leave and see if he calms down at all.

## 2023-03-18 NOTE — Plan of Care (Signed)
  Problem: Clinical Measurements: Goal: Respiratory complications will improve Outcome: Progressing   Problem: Activity: Goal: Risk for activity intolerance will decrease Outcome: Progressing   Problem: Nutrition: Goal: Adequate nutrition will be maintained Outcome: Progressing   Problem: Pain Managment: Goal: General experience of comfort will improve Outcome: Progressing   Problem: Safety: Goal: Ability to remain free from injury will improve Outcome: Progressing   

## 2023-03-19 ENCOUNTER — Inpatient Hospital Stay (HOSPITAL_COMMUNITY): Payer: Medicare Other

## 2023-03-19 ENCOUNTER — Ambulatory Visit: Admitting: Medical Oncology

## 2023-03-19 ENCOUNTER — Ambulatory Visit

## 2023-03-19 ENCOUNTER — Other Ambulatory Visit

## 2023-03-19 DIAGNOSIS — R569 Unspecified convulsions: Secondary | ICD-10-CM | POA: Diagnosis not present

## 2023-03-19 LAB — HEPATIC FUNCTION PANEL
ALT: 20 U/L (ref 0–44)
AST: 25 U/L (ref 15–41)
Albumin: 3.9 g/dL (ref 3.5–5.0)
Alkaline Phosphatase: 77 U/L (ref 38–126)
Bilirubin, Direct: 0.2 mg/dL (ref 0.0–0.2)
Indirect Bilirubin: 0.9 mg/dL (ref 0.3–0.9)
Total Bilirubin: 1.1 mg/dL (ref 0.3–1.2)
Total Protein: 6.8 g/dL (ref 6.5–8.1)

## 2023-03-19 LAB — BASIC METABOLIC PANEL
Anion gap: 14 (ref 5–15)
Anion gap: 13 (ref 5–15)
BUN: 13 mg/dL (ref 6–20)
BUN: 9 mg/dL (ref 6–20)
CO2: 23 mmol/L (ref 22–32)
CO2: 23 mmol/L (ref 22–32)
Calcium: 9.3 mg/dL (ref 8.9–10.3)
Calcium: 9.2 mg/dL (ref 8.9–10.3)
Chloride: 95 mmol/L — ABNORMAL LOW (ref 98–111)
Chloride: 94 mmol/L — ABNORMAL LOW (ref 98–111)
Creatinine, Ser: 0.73 mg/dL (ref 0.61–1.24)
Creatinine, Ser: 0.66 mg/dL (ref 0.61–1.24)
GFR, Estimated: >60 mL/min (ref >60)
GFR, Estimated: >60 mL/min (ref >60)
Glucose, Bld: 97 mg/dL (ref 70–99)
Glucose, Bld: 108 mg/dL — ABNORMAL HIGH (ref 70–99)
Potassium: 3.6 mmol/L (ref 3.5–5.1)
Potassium: 3.7 mmol/L (ref 3.5–5.1)
Sodium: 132 mmol/L — ABNORMAL LOW (ref 135–145)
Sodium: 130 mmol/L — ABNORMAL LOW (ref 135–145)

## 2023-03-19 LAB — CBC
HCT: 38.8 % — ABNORMAL LOW (ref 39.0–52.0)
Hemoglobin: 13.1 g/dL (ref 13.0–17.0)
MCH: 29.1 pg (ref 26.0–34.0)
MCHC: 33.8 g/dL (ref 30.0–36.0)
MCV: 86.2 fL (ref 80.0–100.0)
Platelets: 279 10*3/uL (ref 150–400)
RBC: 4.5 MIL/uL (ref 4.22–5.81)
RDW: 12.9 % (ref 11.5–15.5)
WBC: 6.6 10*3/uL (ref 4.0–10.5)
nRBC: 0 % (ref 0.0–0.2)

## 2023-03-19 LAB — OSMOLALITY: Osmolality: 279 mOsm/kg (ref 275–295)

## 2023-03-19 LAB — AMMONIA: Ammonia: 50 umol/L — ABNORMAL HIGH (ref 9–35)

## 2023-03-19 LAB — MAGNESIUM: Magnesium: 1.7 mg/dL (ref 1.7–2.4)

## 2023-03-19 LAB — FOLATE: Folate: 37.1 ng/mL (ref >5.9)

## 2023-03-19 LAB — VITAMIN B12: Vitamin B-12: 1160 pg/mL — ABNORMAL HIGH (ref 180–914)

## 2023-03-19 LAB — GLUCOSE, CAPILLARY: Glucose-Capillary: 112 mg/dL — ABNORMAL HIGH (ref 70–99)

## 2023-03-19 MED ORDER — METOCLOPRAMIDE HCL 5 MG/ML IJ SOLN
5.0000 mg | Freq: Once | INTRAMUSCULAR | Status: AC
  Administered 2023-03-19: 5 mg via INTRAVENOUS
  Filled 2023-03-19: qty 2

## 2023-03-19 MED ORDER — PROPRANOLOL HCL 10 MG PO TABS
5.0000 mg | ORAL_TABLET | Freq: Three times a day (TID) | ORAL | Status: DC
  Administered 2023-03-19 – 2023-03-20 (×3): 5 mg via ORAL
  Filled 2023-03-19 (×3): qty 0.5

## 2023-03-19 MED ORDER — LABETALOL HCL 5 MG/ML IV SOLN: 5.0000 mg | INTRAVENOUS | Status: DC | PRN

## 2023-03-19 MED ORDER — SODIUM CHLORIDE 0.9 % IV SOLN
100.0000 mg | Freq: Two times a day (BID) | INTRAVENOUS | Status: DC
  Administered 2023-03-19 – 2023-03-20 (×2): 100 mg via INTRAVENOUS
  Filled 2023-03-19 (×3): qty 10

## 2023-03-19 NOTE — Progress Notes (Signed)
Speech Language Pathology Treatment: Dysphagia;Cognitive-Linquistic  Patient Details Name: Trevor Montgomery MRN: [id number] DOB: 1960/11/24 Today's Date: 03/19/2023 Time: 1204-1228 SLP Time Calculation (min) (ACUTE ONLY): 24 min  Assessment / Plan / Recommendation Clinical Impression  SWALLOWING Pt with rattling breath sounds and poor secretion management with anterior spillage of secretions.  SLP intermittent used suction to assist with clearance.  Pt decline solid POs.  Pt took one sip of water by straw followed by immediate cough.  CXR from this morning with no active disease.  Lunch arrived and SLP set up lunch tray for pt.  Immediate following this, pt began to vomit.  Wet gurgling sounds noted and SLP again used suction to clear.  RN notifed.  SLP did not pursue further PO trials. SLP to follow for diet tolerance.  COGNITION Pt with near constant verbalization/pressure of speech.  Pt speaking about past experience from his youth, world affairs without specificity, and religious ideation. Pt could not be redirected or engaged in meaningful conversation, even about his immediate condition.  He stopped speaking briefly with vomiting episode noted above, and was unresponsive to questions about immediate needs.  He shortly thereafter resumed conversation on the same topics as before and expressed displeasure when SLP tried to redirect or asked questions about other topics including his immediate needs.  Pt may not be a good candidate for therapy addressing cognitive-linguistic function.  SLP will follow for trial of additional intervention.   HPI HPI: Pt is a 62 y.o. male who presented secondary to becoming poorly responsive. EMS noted pt to have right gaze deviation and jerking of upper extremities concerning for seizures; pt loaded with Keppra and Ativan. CT head 6/20: Hyperdense lesion in the posterior aspect of the right temporal lobe suspicious for underlying cavernoma. MRI pending at time of  evaluation. CT chest 6/20: Likely debris within the trachea and both mainstem bronchi suggesting aspiration. This was also noted on 6/2. PMH: metastatic lung cancer on palliative chemoradiotherapy, HTN, HLD, COPD, provoked seizures in the setting of electrolyte imbalance and on Keppra 500 BID who was at his baseline. BSE and treatment 6/2 and 6/3 and a regular texture diet with thin liquids was ultimately recommended      SLP Plan  Continue with current plan of care      Recommendations for follow up therapy are one component of a multi-disciplinary discharge planning process, led by the attending physician.  Recommendations may be updated based on patient status, additional functional criteria and insurance authorization.    Recommendations  Diet recommendations: Thin liquid;Dysphagia 3 (mechanical soft) Liquids provided via: Straw;Cup Medication Administration: Crushed with puree Supervision: Full supervision/cueing for compensatory strategies Compensations: Slow rate;Small sips/bites Postural Changes and/or Swallow Maneuvers: Seated upright 90 degrees                  Oral care BID   Frequent or constant Supervision/Assistance Dysphagia, unspecified (R13.10);Cognitive communication deficit (W09.811)     Continue with current plan of care     Kerrie Pleasure, MA, CCC-SLP Acute Rehabilitation Services Office: [phone] 03/19/2023, 12:33 PM

## 2023-03-19 NOTE — Hospital Course (Addendum)
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 On presentation to [facility] ED patient had persistent right gaze deviation received Keppra 4 g and Ativan 4 mg with resultant improvement with right gaze deviation however remains significantly obtunded. 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 Neurology consulted and patient transferred to [facility] for continuous EEG for further evaluation. 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  Was given Decadron which she was not able to tolerate due to ongoing agitation.  Now having intractable nausea and vomiting issues.  Assessment and Plan: Recurrent seizures. Recent episode in early June. Readmission this time around. Neurology was consulted. Think seizures provoked secondary to hyponatremia. EEG/LTM negative for any active seizures. Was on Keppra increased to 1000 mg twice daily. Now per discussion with neuro-oncology patient will be on Vimpat. Neurology reconsulted as the patient appears to have episodes of staring spell both on 6/26 as well as 6/25. LTM EEG did capture a possible seizure spike on 6/26.  Vimpat dose increased to 150 mg twice daily per discussion with neurology. Per my discussion continue LTM EEG for 24 hours.  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  With vasogenic edema On further workup of his seizures CT head was done which showed a hyperdense lesion in the right temporal lobe and a hypodense lesion in the right cerebellum. CT angiogram head and neck as well as CT venogram  concerning for metastatic lesions with vasogenic edema. 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 Neuro-oncology was earlier consulted. I discussed with neuro-oncology on 6/26.  Patient will be discussed on tumor board.  Patient may require whole brain radiation.  Prognosis not clear as neuro-oncology would like repeat imaging with contrast. But with the fact that the patient already has stage IV small cell lung cancer and now has brain metastasis on maintenance chemotherapy prognosis is poor.  Hyponatremia. SIADH. Possibility of association with psychiatric medications with dry mouth as well. Currently corrected. At risk of further recurrence.  Hypokalemia. Hypomagnesemia. Replaced.  Aspiration pneumonia. Currently on IV Unasyn. Repeat x-ray on 6/26 does not show any evidence of worsening pneumonia but examination reveals bilateral expiratory wheezing and rhonchi and secretions in the upper airway. Cultures negative. CT shows evidence of tracheal debris's. Appreciate SLP follow-up. Prognosis is poor.  Schizophrenia. Patient is a schizophrenia and has been on Prolixin. Was also on Cogentin which is now switched to as needed. Due to his ongoing agitation as well as concern for hallucination psychiatry is reconsulted for further assistance. Appreciate assistance.  Given that the patient has been stable from schizophrenia point of view for so long, therefore his current presentation is more likely attributed to his organic causes per psychiatric.  Acute encephalopathy likely combination of toxic and metabolic as well as presence of hospital induced delirium, schizophrenia and brain metastatic lesions. Paraneoplastic syndrome cannot be ruled out as well. Patient with ongoing agitation.  Perseverance about same topic and concern for possible hallucination. Not participating with physical therapy. Not eating. This does not bode well for his diagnosis of cancer with his need  for radiation and chemotherapy.  There was concern for Decadron induced confusion as well. There is concern for Keppra induced confusion and agitation and therefore we will switch it to Vimpat per discussion with neuro-oncology. Currently has ordered for as needed Haldol. Psychiatry consulted.  For further assistance.  Goals of care conversation. Patient is not in a condition to have any conversation with regards to his goals of care. Discussed with sister who is the next of kin. Patient reportedly has mentioned that he would prefer comfort for his primary goal. He has schizophrenia and unable to tolerate medications for cerebral edema like Decadron with resultant agitation. Has stage IV lung cancer and now appears to have metastatic brain lesions for which she will require further therapy. Currently not participating in therapy in the hospital not participating with nutrition in the hospital. Concern is that patient's condition will continue to deteriorate further. Perative care consulted. Discussed with family and recommended consideration for hospice given his multiple comorbidities. Per palliative care family currently leaning towards home hospice.  Intractable nausea and vomiting. Etiology not clear. X-ray abdomen unremarkable.  Nausea vomiting currently resolved.  Monitor.  Tachycardia and elevated blood pressure. Improved.  Now blood pressure is soft.  Will be giving IV fluids.  Anxiety. On Inderal 5 mg twice daily.  Continue.

## 2023-03-19 NOTE — Progress Notes (Signed)
LTM EEG hooked up and running - no initial skin breakdown - push button tested - [facility] monitoring.  

## 2023-03-19 NOTE — Progress Notes (Signed)
Patient is alert but confused, talking in appropriate things. BP was running above 160, so gave him PRN hydralazine one at 0856 and another at 1559, now controlled, had 2 episodes of vomiting gave his PRN zofran at 1231 and one time dose of Reglan. He also had one episode of staring spell, not talking but was following commands, informed to DR. CBG was 112.  Will continue to monitor

## 2023-03-19 NOTE — Progress Notes (Signed)
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 On presentation to [facility] ED patient had persistent right gaze deviation received Keppra 4 g and Ativan 4 mg with resultant improvement with right gaze deviation however remains significantly obtunded. 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 Neurology consulted and patient transferred to [facility] for continuous EEG for further evaluation. 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  Was given Decadron which she was not able to tolerate due to ongoing agitation.  Now having intractable nausea and vomiting issues.  Assessment and Plan: Recurrent seizures. Recent episode in early June. Readmission this time around. Neurology was consulted. Think seizures provoked secondary to hyponatremia. EEG/LTM negative for any active seizures. Was on Keppra increased to 1000 mg twice daily. Now per discussion with neuro-oncology patient will be on Vimpat. Neurology reconsulted as the patient appears to have episodes of staring spell both on 6/26 as well as 6/25.  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  With vasogenic edema On further workup of his seizures CT head was done which showed a hyperdense lesion in the right temporal lobe and a hypodense lesion in the right cerebellum. CT angiogram head and neck as well as CT venogram concerning for metastatic  lesions with vasogenic edema. 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 Neuro-oncology was earlier consulted. I discussed with neuro-oncology on 6/26.  Patient will be discussed on tumor board.  Patient may require whole brain radiation.  Prognosis not clear as neuro-oncology would like repeat imaging with contrast. But with the fact that the patient already has stage IV small cell lung cancer and now has brain metastasis on maintenance chemotherapy prognosis is poor.  Hyponatremia. SIADH. Possibility of association with psychiatric medications with dry mouth as well. Currently corrected. At risk of further recurrence.  Hypokalemia. Hypomagnesemia. Replaced.  Aspiration pneumonia. Currently on IV Unasyn. Repeat x-ray on 6/26 does not show any evidence of worsening pneumonia but examination reveals bilateral expiratory wheezing and rhonchi and secretions in the upper airway. Cultures negative. CT shows evidence of tracheal debris's. Appreciate SLP follow-up. Prognosis is poor.  Schizophrenia. Patient is a schizophrenia and has been on Prolixin. Was also on Cogentin which is now switched to as needed. Due to his ongoing agitation as well as concern for hallucination psychiatry is reconsulted for further assistance.  Acute encephalopathy likely combination of toxic and metabolic as well as presence of hospital induced delirium, schizophrenia and brain metastatic lesions. Paraneoplastic syndrome cannot be ruled out as well. Patient with ongoing agitation.  Perseverance about same topic and concern for possible hallucination. Not participating with physical therapy. Not eating. This does not bode well for his diagnosis of cancer with his need for radiation and chemotherapy. There was concern for Decadron induced confusion as well. There is concern for Keppra induced confusion and agitation and therefore we will switch it to Vimpat per discussion with  neuro-oncology. Currently has ordered for as needed Haldol. Psychiatry consulted.  For  further assistance.  Goals of care conversation. Patient is not in a condition to have any conversation with regards to his goals of care. Discussed with sister who is the next of kin. Patient reportedly has mentioned that he would prefer comfort for his primary goal. He has schizophrenia and unable to tolerate medications for cerebral edema like Decadron with resultant agitation. Has stage IV lung cancer and now appears to have metastatic brain lesions for which she will require further therapy. Currently not participating in therapy in the hospital not participating with nutrition in the hospital. Concern is that patient's condition will continue to deteriorate further. Perative care consulted. Discussed with family and recommended consideration for hospice given his multiple comorbidities.  Intractable nausea and vomiting. Etiology not clear. Will get x-ray abdomen. As needed Reglan and Zofran.   Subjective: Had 2 episodes of nausea and vomiting.  Minimal oral intake throughout the day.  Continuously agitated requiring redirection.  No BM so far today.  Did not participate with therapy today as well. Patient had staring spell episode while working with the nurse at bedside, reportedly while having the spell patient was able to follow commands.  Physical Exam: General: in Mild distress, No Rash Cardiovascular: S1 and S2 Present, No Murmur Respiratory: Good respiratory effort, Bilateral Air entry present. No Crackles, No wheezes Abdomen: Bowel Sound present, No tenderness Extremities: No edema Neuro: Alert and oriented x3, no new focal deficit  Data Reviewed: I have Reviewed nursing notes, Vitals, and Lab results. Since last encounter, pertinent lab results CBC and BMP   . I have ordered test including CBC and BMP  . I have discussed pt's care plan and test results with psychiatry,  neuro-oncology, neurology  . I have ordered imaging x-ray abdomen x-ray chest  .   Disposition: Status is: Inpatient Remains inpatient appropriate because: Further workup and further clarity on goals of care  enoxaparin (LOVENOX) injection 40 mg Start: 03/15/23 2200   Family Communication: Sister at bedside Level of care: Telemetry Cardiac   Vitals:   03/19/23 1430 03/19/23 1525 03/19/23 1552 03/19/23 1630  BP: (!) 157/95 (!) 160/100 (!) 167/112 (!) 155/94  Pulse: (!) 116 (!) 118 (!) 116 (!) 118  Resp:  18    Temp: 98 F (36.7 C)  97.8 F (36.6 C) 97.8 F (36.6 C)  TempSrc: Oral  Oral Oral  SpO2: 96% 98% 98% 98%  Height:         Author: Lynden Oxford, MD 03/19/2023 6:26 PM  Please look on [email or web address] to find out who is on call.

## 2023-03-19 NOTE — Progress Notes (Addendum)
PT Cancellation Note  Patient Details Name: Trevor Montgomery MRN: [id number] DOB: 05-29-1961   Cancelled Treatment:    Reason Eval/Treat Not Completed: Other (comment) Pt continues to refuse out of bed mobility; perseverates on staying in the bed to "heal." Denies offers to sit on edge of bed to eat, go to the bathroom, walk in the room, stand, etc. Pt reports he will continue to use bed pan in the bed rather than get out of the bed to use the bathroom. He then becomes tangential and begins discussing Prevagen. He has not gotten out of bed since 6/22. Discussed with team.  Lillia Pauls, PT, DPT Acute Rehabilitation Services Office [phone]    [name] 03/19/2023, 3:45 PM

## 2023-03-20 ENCOUNTER — Encounter (HOSPITAL_COMMUNITY): Payer: Self-pay | Admitting: Internal Medicine

## 2023-03-20 DIAGNOSIS — C3490 Malignant neoplasm of unspecified part of unspecified bronchus or lung: Secondary | ICD-10-CM | POA: Diagnosis not present

## 2023-03-20 DIAGNOSIS — Z515 Encounter for palliative care: Secondary | ICD-10-CM

## 2023-03-20 DIAGNOSIS — Z66 Do not resuscitate: Secondary | ICD-10-CM

## 2023-03-20 DIAGNOSIS — Z7189 Other specified counseling: Secondary | ICD-10-CM | POA: Diagnosis not present

## 2023-03-20 DIAGNOSIS — R569 Unspecified convulsions: Secondary | ICD-10-CM | POA: Diagnosis not present

## 2023-03-20 LAB — CBC WITH DIFFERENTIAL/PLATELET
Abs Immature Granulocytes: 0.06 10*3/uL (ref 0.00–0.07)
Basophils Absolute: 0 10*3/uL (ref 0.0–0.1)
Basophils Relative: 0 %
Eosinophils Absolute: 0.1 10*3/uL (ref 0.0–0.5)
Eosinophils Relative: 2 %
HCT: 39.8 % (ref 39.0–52.0)
Hemoglobin: 13.2 g/dL (ref 13.0–17.0)
Immature Granulocytes: 1 %
Lymphocytes Relative: 12 %
Lymphs Abs: 0.9 10*3/uL (ref 0.7–4.0)
MCH: 29.3 pg (ref 26.0–34.0)
MCHC: 33.2 g/dL (ref 30.0–36.0)
MCV: 88.2 fL (ref 80.0–100.0)
Monocytes Absolute: 0.6 10*3/uL (ref 0.1–1.0)
Monocytes Relative: 7 %
Neutro Abs: 6.5 10*3/uL (ref 1.7–7.7)
Neutrophils Relative %: 78 %
Platelets: 300 10*3/uL (ref 150–400)
RBC: 4.51 MIL/uL (ref 4.22–5.81)
RDW: 13.1 % (ref 11.5–15.5)
WBC: 8.2 10*3/uL (ref 4.0–10.5)
nRBC: 0 % (ref 0.0–0.2)

## 2023-03-20 LAB — COMPREHENSIVE METABOLIC PANEL
ALT: 19 U/L (ref 0–44)
AST: 24 U/L (ref 15–41)
Albumin: 3.9 g/dL (ref 3.5–5.0)
Alkaline Phosphatase: 71 U/L (ref 38–126)
Anion gap: 11 (ref 5–15)
BUN: 17 mg/dL (ref 6–20)
CO2: 24 mmol/L (ref 22–32)
Calcium: 9.4 mg/dL (ref 8.9–10.3)
Chloride: 95 mmol/L — ABNORMAL LOW (ref 98–111)
Creatinine, Ser: 1.11 mg/dL (ref 0.61–1.24)
GFR, Estimated: >60 mL/min (ref >60)
Glucose, Bld: 159 mg/dL — ABNORMAL HIGH (ref 70–99)
Potassium: 3.6 mmol/L (ref 3.5–5.1)
Sodium: 130 mmol/L — ABNORMAL LOW (ref 135–145)
Total Bilirubin: 1 mg/dL (ref 0.3–1.2)
Total Protein: 6.6 g/dL (ref 6.5–8.1)

## 2023-03-20 LAB — MAGNESIUM: Magnesium: 1.7 mg/dL (ref 1.7–2.4)

## 2023-03-20 MED ORDER — BISACODYL 10 MG RE SUPP
10.0000 mg | Freq: Once | RECTAL | Status: AC
  Administered 2023-03-20: 10 mg via RECTAL
  Filled 2023-03-20: qty 1

## 2023-03-20 MED ORDER — SODIUM CHLORIDE 0.9 % IV SOLN
150.0000 mg | Freq: Two times a day (BID) | INTRAVENOUS | Status: DC
  Administered 2023-03-20 – 2023-03-24 (×8): 150 mg via INTRAVENOUS
  Filled 2023-03-20 (×9): qty 15

## 2023-03-20 MED ORDER — SODIUM CHLORIDE 0.9 % IV SOLN: INTRAVENOUS | Status: DC

## 2023-03-20 MED ORDER — LACOSAMIDE 50 MG PO TABS
100.0000 mg | ORAL_TABLET | Freq: Once | ORAL | Status: AC
  Administered 2023-03-20: 100 mg via ORAL
  Filled 2023-03-20: qty 2

## 2023-03-20 MED ORDER — SODIUM CHLORIDE 0.9 % IV SOLN
50.0000 mg | Freq: Once | INTRAVENOUS | Status: AC
  Administered 2023-03-20: 50 mg via INTRAVENOUS
  Filled 2023-03-20: qty 5

## 2023-03-20 MED ORDER — PROPRANOLOL HCL 10 MG PO TABS
5.0000 mg | ORAL_TABLET | Freq: Two times a day (BID) | ORAL | Status: DC
  Administered 2023-03-20 – 2023-03-24 (×8): 5 mg via ORAL
  Filled 2023-03-20 (×9): qty 0.5

## 2023-03-20 MED ORDER — LACTULOSE 10 GM/15ML PO SOLN
20.0000 g | Freq: Every day | ORAL | Status: DC
  Administered 2023-03-20: 20 g via ORAL
  Filled 2023-03-20: qty 30

## 2023-03-20 MED ORDER — SODIUM CHLORIDE 0.9 % IV BOLUS
1000.0000 mL | Freq: Once | INTRAVENOUS | Status: AC
  Administered 2023-03-20: 1000 mL via INTRAVENOUS

## 2023-03-20 NOTE — Consult Note (Signed)
[facility] Face-to-Face Psychiatry Consult   Reason for Consult:   Referring Physician:   Patient Identification: Trevor Montgomery MRN:  [id number] Principal Diagnosis: Seizure (HCC) Diagnosis:  Principal Problem:   Seizure (HCC) Active Problems:   Hypertension   Schizophrenia (HCC)   Hyponatremia   Metastatic lung cancer (metastasis from lung to other site) (HCC)   Hypokalemia   Aspiration pneumonia (HCC)   Chronic obstructive lung disease (HCC)   Chronic hypoxemic respiratory failure (HCC)   Hypomagnesemia   Cerebrovascular accident (CVA) (HCC)   Total Time spent with patient: 1 hour  Subjective:   Trevor Montgomery is a 62 y.o. male patient admitted with seizure like activity. Psych consult placed for schizophrenia, with agitation and staring spell, possible visual hallucinations. Metastatic appearing brain lesions. Per primary team" The primary question is how much of his agitation and lack of participation is related to his schizophrenia!" Initial communication around this patient concerned his hyponatremia which was suspected to be related to his prolixin. Chart review shows patient has long history of hyponatremia, and recent diagnosis of SCLC dx In January 2024. At which point patient stopped smoking and drinking sodas. Today on evaluation he is difficult to keep awake and dozes off, consent is obtained to speak with his sister Trevor Montgomery who is at the bedside. Several times while talking to Trevor Montgomery, patient would interject and tell his side of the story and recount most details.   Patient sister reports she strongly does not believe this is his schizophrenia but a progression of his disease. She reports she has been with her brother since a child and he has been fairly stable with the exception of when he stops his medications. " When he stops his medications he becomes superman or spider man and believes he can crawl and scale the wall. She was unable to provide recent experiences as he has been fairly  stable. Furthermore she reports he has never been agitated or restless, like he has been here. She said her brother is full of life and loves to talk, so this a new change in his behaviors and patterns. " That is how I knew something was wrong with him. She reports yesterday was a day in which he kept asking for a phone to call the president. Per patient " I don't want people to keep getting hurt. They need to stop all this fighting and killing. Trump, Biden, Obama they all need to come together and stop fighting.  She states he was restrained because he was taking out his IVs, and this is new for him.   On today's evaluation he is observed to be lying in the bed with EEG leads connected.   Patient does acknowledge this Clinical research associate, and asks to close the door.  Today patient is able to self reflect on his positive interaction with his sister and nursing staff.  Patient does appear to be more relaxed, as he describes some of his personal yet valuable belongings.   We did review options on home health, PMT, and hospice. It seems sister would love to take him home if adequate resources are provided.  Patient states he will comply with current rules, at the time of the evaluation he is not in restraints.   He continues to be appropriate as he is able to have a linear conversation.  He is also able to reflect and remain positive about his next selection for home health. There does not appear to be any evidence of confabulation, psychosis, delusional thinking.  He does not appear to be responding to internal stimuli, external stimuli.  He is able to engage well and follow all commands. He has been compliant with his psychotropic medications outside of the hospital.  Sister declines any additional diagnosis to include intellectual disability. Furthermore patient does have difficulty connecting his dreams with fiction vs reality. He does report being safe while in the hospital and denies and safety concerns. While future  psychiatric events cannot be accurately predicted, the patient does not currently require further acute inpatient psychiatric care.   Due to limited history of baseline mental illness, stabilization of schizophrenia and patient being asymptomatic of psychosis, paranoia and delusions; we can not safely distinguish which symptoms are more prominent at this time. We can safely state that he does have multiple factors that are contributing to his decompensation in psychosis and agitation this include electrolyte abnormalities, brain metastasis, medication side effects Patient will always be at risk for dangerous behavior due to his Schizophrenia and brain mets. Although it is fair to say patient symptoms maybe due to his schizophrenia, one can confidentially say he has not displayed any acute or chronic symptoms of schizophrenia for  an extended period of time. While a hospitalization is not likely to resolve this problem it can be mitigated with appropriate supervision and supportive milieu with trained staff educated in behavioral modification and Mental illness. Medication management may continue to help going forward, but positive behavioral therapy will be much more beneficial for Trevor Montgomery.     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 On presentation to [facility] ED patient had persistent right gaze deviation received Keppra 4 g and Ativan 4 mg with resultant improvement with right gaze deviation however remains significantly obtunded. 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 Neurology  consulted and patient transferred to [facility] for continuous EEG for further evaluation. 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  Was given Decadron which she was not able to tolerate due to ongoing agitation.  Now having intractable nausea and vomiting issues  Past Psychiatric History: Schizophrenia diagnosis in the 71s. Has tried multiple antipsychotics in the past. He sees Holiday representative at [employer]. He is compliant on his Prolixin and cogentin, with good therapeutic effects.   Risk to Self:  Denies Risk to Others:   Denies Prior Inpatient Therapy:   " Many years ago" Prior Outpatient Therapy:   [facility]  Past Medical History:  Past Medical History:  Diagnosis Date   Anxiety    Arthritis    Emphysema of lung (HCC)    HTN (hypertension)    Hyperlipemia    Schizoaffective disorder, bipolar type (HCC)    Substance abuse (HCC)     Past Surgical History:  Procedure Laterality Date   FINGER SURGERY     IR IMAGING GUIDED PORT INSERTION  11/04/2022   VIDEO BRONCHOSCOPY WITH ENDOBRONCHIAL ULTRASOUND Left 10/11/2022   Procedure: VIDEO BRONCHOSCOPY WITH ENDOBRONCHIAL ULTRASOUND;  Surgeon: Raechel Chute, MD;  Location: [facility] ORS;  Service: Pulmonary;  Laterality: Left;   Family History:  Family History  Problem Relation Age of Onset  Stroke Father    Seizures Father    Stroke Sister    Cancer Maternal Grandmother    Family Psychiatric  History: Father was a veteran, likely with PTSD and other underlying diagnosis. Per sister mental health secrets were well kept.  Social History:  Social History   Substance and Sexual Activity  Alcohol Use No     Social History   Substance and Sexual Activity  Drug Use Not Currently    Social History   Socioeconomic History   Marital status: Single    Spouse name: Not on file   Number of children: Not on file   Years of education: Not on file   Highest education level: Not on file  Occupational History    Not on file  Tobacco Use   Smoking status: Former    Packs/day: 0.50    Years: 40.00    Additional pack years: 0.00    Total pack years: 20.00    Types: Cigarettes    Quit date: 10/15/2022    Years since quitting: 0.4   Smokeless tobacco: Former    Quit date: 10/15/2022  Vaping Use   Vaping Use: Not on file  Substance and Sexual Activity   Alcohol use: No   Drug use: Not Currently   Sexual activity: Not Currently  Other Topics Concern   Not on file  Social History Narrative   Not on file   Social Determinants of Health   Financial Resource Strain: Low Risk  (11/11/2022)   Overall Financial Resource Strain (CARDIA)    Difficulty of Paying Living Expenses: Not very hard  Food Insecurity: No Food Insecurity (03/14/2023)   Hunger Vital Sign    Worried About Running Out of Food in the Last Year: Never true    Ran Out of Food in the Last Year: Never true  Transportation Needs: No Transportation Needs (03/14/2023)   PRAPARE - Administrator, Civil Service (Medical): No    Lack of Transportation (Non-Medical): No  Physical Activity: Inactive (11/11/2022)   Exercise Vital Sign    Days of Exercise per Week: 0 days    Minutes of Exercise per Session: 0 min  Stress: Not on file  Social Connections: Unknown (11/11/2022)   Social Connection and Isolation Panel [NHANES]    Frequency of Communication with Friends and Family: More than three times a week    Frequency of Social Gatherings with Friends and Family: More than three times a week    Attends Religious Services: Not on Insurance claims handler of Clubs or Organizations: No    Attends Banker Meetings: Never    Marital Status: Never married   Additional Social History:    Allergies:   Allergies  Allergen Reactions   Fluphenazine     Other Reaction(s): Unknown    Labs:  Results for orders placed or performed during the hospital encounter of 03/14/23 (from the past 48 hour(s))  CBC     Status:  Abnormal   Collection Time: 03/19/23  4:04 AM  Result Value Ref Range   WBC 6.6 4.0 - 10.5 K/uL   RBC 4.50 4.22 - 5.81 MIL/uL   Hemoglobin 13.1 13.0 - 17.0 g/dL   HCT 74.2 (L) 59.5 - 63.8 %   MCV 86.2 80.0 - 100.0 fL   MCH 29.1 26.0 - 34.0 pg   MCHC 33.8 30.0 - 36.0 g/dL   RDW 75.6 43.3 - 29.5 %   Platelets 279 150 -  400 K/uL   nRBC 0.0 0.0 - 0.2 %    Comment: Performed at [facility], [address], [place], [place] [address]  Basic metabolic panel     Status: Abnormal   Collection Time: 03/19/23  4:04 AM  Result Value Ref Range   Sodium 132 (L) 135 - 145 mmol/L   Potassium 3.6 3.5 - 5.1 mmol/L   Chloride 95 (L) 98 - 111 mmol/L   CO2 23 22 - 32 mmol/L   Glucose, Bld 97 70 - 99 mg/dL    Comment: Glucose reference range applies only to samples taken after fasting for at least 8 hours.   BUN 13 6 - 20 mg/dL   Creatinine, Ser 6.04 0.61 - 1.24 mg/dL   Calcium 9.3 8.9 - 54.0 mg/dL   GFR, Estimated >98 >11 mL/min    Comment: (NOTE) Calculated using the CKD-EPI Creatinine Equation (2021)    Anion gap 14 5 - 15    Comment: Performed at [facility], [address], [place], [place] [address]  Magnesium     Status: None   Collection Time: 03/19/23  4:04 AM  Result Value Ref Range   Magnesium 1.7 1.7 - 2.4 mg/dL    Comment: Performed at [facility], [address] [place], [place] [address]  Glucose, capillary     Status: Abnormal   Collection Time: 03/19/23  3:51 PM  Result Value Ref Range   Glucose-Capillary 112 (H) 70 - 99 mg/dL    Comment: Glucose reference range applies only to samples taken after fasting for at least 8 hours.  Vitamin B12     Status: Abnormal   Collection Time: 03/19/23  5:05 PM  Result Value Ref Range   Vitamin B-12 1,160 (H) 180 - 914 pg/mL    Comment: (NOTE) This assay is not validated for testing neonatal or myeloproliferative syndrome specimens for Vitamin B12 levels. Performed at [facility], [address],  [place], [place] [address]   Folate     Status: None   Collection Time: 03/19/23  5:05 PM  Result Value Ref Range   Folate 37.1 >5.9 ng/mL    Comment: Performed at [facility], [address], [place], [place] [address]  Ammonia     Status: Abnormal   Collection Time: 03/19/23  5:05 PM  Result Value Ref Range   Ammonia 50 (H) 9 - 35 umol/L    Comment: HEMOLYSIS AT THIS LEVEL MAY AFFECT RESULT Performed at [facility], [address] [place], [place] [address]   Basic metabolic panel     Status: Abnormal   Collection Time: 03/19/23  5:34 PM  Result Value Ref Range   Sodium 130 (L) 135 - 145 mmol/L   Potassium 3.7 3.5 - 5.1 mmol/L   Chloride 94 (L) 98 - 111 mmol/L   CO2 23 22 - 32 mmol/L   Glucose, Bld 108 (H) 70 - 99 mg/dL    Comment: Glucose reference range applies only to samples taken after fasting for at least 8 hours.   BUN 9 6 - 20 mg/dL   Creatinine, Ser 2.84 0.61 - 1.24 mg/dL   Calcium 9.2 8.9 - 13.2 mg/dL   GFR, Estimated >44 >01 mL/min    Comment: (NOTE) Calculated using the CKD-EPI Creatinine Equation (2021)    Anion gap 13 5 - 15    Comment: Performed at [facility], [address], [place], [place]  [address]  Osmolality     Status: None   Collection Time: 03/19/23  5:34 PM  Result Value Ref Range   Osmolality 279 275 - 295 mOsm/kg    Comment: Performed at [facility], [address], [place], [place] [address]  Hepatic function panel     Status: None   Collection Time: 03/19/23  5:34 PM  Result Value Ref Range   Total Protein 6.8 6.5 - 8.1 g/dL   Albumin 3.9 3.5 - 5.0 g/dL   AST 25 15 - 41 U/L   ALT 20 0 - 44 U/L   Alkaline Phosphatase 77 38 - 126 U/L   Total Bilirubin 1.1 0.3 - 1.2 mg/dL   Bilirubin, Direct 0.2 0.0 - 0.2 mg/dL   Indirect Bilirubin 0.9 0.3 - 0.9 mg/dL    Comment: Performed at [facility], [address] [place], [place] [address]  CBC with Differential/Platelet     Status: None   Collection Time: 03/20/23  11:50 AM  Result Value Ref Range   WBC 8.2 4.0 - 10.5 K/uL   RBC 4.51 4.22 - 5.81 MIL/uL   Hemoglobin 13.2 13.0 - 17.0 g/dL   HCT 13.0 86.5 - 78.4 %   MCV 88.2 80.0 - 100.0 fL   MCH 29.3 26.0 - 34.0 pg   MCHC 33.2 30.0 - 36.0 g/dL   RDW 69.6 29.5 - 28.4 %   Platelets 300 150 - 400 K/uL   nRBC 0.0 0.0 - 0.2 %   Neutrophils Relative % 78 %   Neutro Abs 6.5 1.7 - 7.7 K/uL   Lymphocytes Relative 12 %   Lymphs Abs 0.9 0.7 - 4.0 K/uL   Monocytes Relative 7 %   Monocytes Absolute 0.6 0.1 - 1.0 K/uL   Eosinophils Relative 2 %   Eosinophils Absolute 0.1 0.0 - 0.5 K/uL   Basophils Relative 0 %   Basophils Absolute 0.0 0.0 - 0.1 K/uL   Immature Granulocytes 1 %   Abs Immature Granulocytes 0.06 0.00 - 0.07 K/uL    Comment: Performed at [facility], [address] [place], [place] [address]  Comprehensive metabolic panel     Status: Abnormal   Collection Time: 03/20/23 11:50 AM  Result Value Ref Range   Sodium 130 (L) 135 - 145 mmol/L   Potassium 3.6 3.5 - 5.1 mmol/L   Chloride 95 (L) 98 - 111 mmol/L   CO2 24 22 - 32 mmol/L   Glucose, Bld 159 (H) 70 - 99 mg/dL    Comment: Glucose reference range applies only to samples taken after fasting for at least 8 hours.   BUN 17 6 - 20 mg/dL   Creatinine, Ser 0.10 0.61 - 1.24 mg/dL   Calcium 9.4 8.9 - 27.2 mg/dL   Total Protein 6.6 6.5 - 8.1 g/dL   Albumin 3.9 3.5 - 5.0 g/dL   AST 24 15 - 41 U/L   ALT 19 0 - 44 U/L   Alkaline Phosphatase 71 38 - 126 U/L   Total Bilirubin 1.0 0.3 - 1.2 mg/dL   GFR, Estimated >53 >66 mL/min    Comment: (NOTE) Calculated using the CKD-EPI Creatinine Equation (2021)    Anion gap 11 5 - 15    Comment: Performed at [facility], [address] [place], [place] [address]  Magnesium     Status: None   Collection Time: 03/20/23 11:50 AM  Result Value Ref Range   Magnesium 1.7 1.7 -  2.4 mg/dL    Comment: Performed at [facility], [address], [place], [place] [address]    Current  Facility-Administered Medications  Medication Dose Route Frequency Provider Last Rate Last Admin   0.9 %  sodium chloride infusion   Intravenous Continuous Rolly Salter, MD 100 mL/hr at 03/20/23 1338 New Bag at 03/20/23 1338   acetaminophen (TYLENOL) tablet 650 mg  650 mg Oral Q4H PRN Foust, Katy L, NP       Or   acetaminophen (TYLENOL) suppository 650 mg  650 mg Rectal Q4H PRN Foust, Katy L, NP       benztropine (COGENTIN) tablet 0.5 mg  0.5 mg Oral BID PRN Rodolph Bong, MD       enoxaparin (LOVENOX) injection 40 mg  40 mg Subcutaneous QHS Bhagat, Srishti L, MD   40 mg at 03/19/23 2115   fluPHENAZine (PROLIXIN) tablet 5 mg  5 mg Oral Daily Rodolph Bong, MD   5 mg at 03/20/23 0953   guaiFENesin (MUCINEX) 12 hr tablet 1,200 mg  1,200 mg Oral BID Foust, Katy L, NP   1,200 mg at 03/20/23 6213   haloperidol lactate (HALDOL) injection 2 mg  2 mg Intravenous Q6H PRN Rodolph Bong, MD       hydrALAZINE (APRESOLINE) injection 10 mg  10 mg Intravenous Q6H PRN Janalyn Shy, Subrina, MD   10 mg at 03/19/23 2212   ipratropium-albuterol (DUONEB) 0.5-2.5 (3) MG/3ML nebulizer solution 3 mL  3 mL Nebulization Q4H PRN Foust, Katy L, NP       labetalol (NORMODYNE) injection 5 mg  5 mg Intravenous Q2H PRN Rolly Salter, MD       lacosamide (VIMPAT) 150 mg in sodium chloride 0.9 % 25 mL IVPB  150 mg Intravenous Q12H Rolly Salter, MD       lactulose (CHRONULAC) 10 GM/15ML solution 20 g  20 g Oral Daily Rolly Salter, MD   20 g at 03/20/23 0865   LORazepam (ATIVAN) injection 0.5 mg  0.5 mg Intravenous Once Rodolph Bong, MD       LORazepam (ATIVAN) injection 2 mg  2 mg Intravenous Q5 min PRN Foust, Katy L, NP       magnesium oxide (MAG-OX) tablet 800 mg  800 mg Oral BID Rodolph Bong, MD   800 mg at 03/20/23 0953   ondansetron (ZOFRAN) tablet 4 mg  4 mg Oral Q6H PRN Foust, Katy L, NP       Or   ondansetron (ZOFRAN) injection 4 mg  4 mg Intravenous Q6H PRN Foust, Katy L, NP   4 mg at  03/19/23 1231   Oral care mouth rinse  15 mL Mouth Rinse PRN Sundil, Subrina, MD       pantoprazole (PROTONIX) EC tablet 40 mg  40 mg Oral BID AC Rodolph Bong, MD   40 mg at 03/20/23 7846   polyethylene glycol (MIRALAX / GLYCOLAX) packet 17 g  17 g Oral Daily PRN Foust, Katy L, NP       potassium chloride SA (KLOR-CON M) CR tablet 20 mEq  20 mEq Oral BID Rodolph Bong, MD   20 mEq at 03/20/23 0953   propranolol (INDERAL) tablet 5 mg  5 mg Oral BID Rolly Salter, MD       sodium chloride tablet 1 g  1 g Oral TID Rodolph Bong, MD   1 g at 03/20/23 1511    Musculoskeletal: Strength &  Muscle Tone:  UTA Gait & Station:  UTA Patient leans: N/A    Psychiatric Specialty Exam:  Presentation  General Appearance:  Appropriate for Environment; Casual  Eye Contact: Fair  Speech: Clear and Coherent; Slow  Speech Volume: Normal  Handedness: Right   Mood and Affect  Mood: Anxious  Affect: Appropriate; Congruent   Thought Process  Thought Processes: Coherent; Linear  Descriptions of Associations:Intact  Orientation:Full (Time, Place and Person)  Thought Content:Logical  History of Schizophrenia/Schizoaffective disorder:Yes diagnosed in 64s.  Duration of Psychotic Symptoms: been without symptoms for several years Hallucinations:Hallucinations: None  Ideas of Reference:None  Suicidal Thoughts:Suicidal Thoughts: No  Homicidal Thoughts:Homicidal Thoughts: No   Sensorium  Memory: Recent Fair; Remote Fair; Immediate Poor  Judgment: Fair  Insight: Fair   Chartered certified accountant: Fair  Attention Span: Poor  Recall: Poor  Fund of Knowledge: Fair  Language: Fair   Psychomotor Activity  Psychomotor Activity: Psychomotor Activity: Normal (Some drooling at the corner of mouth.)   Assets  Assets:Communication Skills; Resilience; Social Support; Housing; Health and safety inspector; Desire for Improvement   Sleep   Sleep: Sleep: Fair   Physical Exam: Physical Exam Vitals and nursing note reviewed.  Constitutional:      Appearance: Normal appearance. He is normal weight.  Skin:    General: Skin is warm.     Capillary Refill: Capillary refill takes less than 2 seconds.  Neurological:     General: No focal deficit present.     Mental Status: He is alert and oriented to person, place, and time. Mental status is at baseline.  Psychiatric:        Attention and Perception: He is inattentive.        Mood and Affect: Affect normal. Mood is anxious.        Behavior: Behavior normal. Behavior is cooperative.        Thought Content: Thought content normal.        Cognition and Memory: Memory normal. Cognition is impaired.        Judgment: Judgment normal.     Comments: illusions    Review of Systems  Psychiatric/Behavioral:  Positive for substance abuse. Negative for depression, hallucinations, memory loss and suicidal ideas. The patient is not nervous/anxious and does not have insomnia.        Vivid dreams  All other systems reviewed and are negative.  Blood pressure 134/79, pulse 88, temperature 97.6 F (36.4 C), temperature source Oral, resp. rate 18, height 5\' 9"  (1.753 m), SpO2 98 %. Body mass index is 31.51 kg/m.  Treatment Plan Summary: Patient free of symptoms at the time of the evaluation. Unable to determine if intermittent symptoms are connected to schizophrenia, or brain mets. We can assume that due to length of stability of schizophrenia in the context of medication compliance, his current presentation is likely linked to organic causes (I.e post ictal psychosis/aggression; brain mets).   Plan   Schizophrenia:  Continue current prolixin 5mg  po daily and cogentin 0.5mg  po BID prn. Continue prn medications at this time (Haldol). Lab abnormalities will defer to primary team. B12 (1160), ammonia (50),  At risk for prolonged QTc-continue to limit use of prn antipsychotics. Patient has  multiple risk factors for QTc prolongation.   Psychiatric consult service to sign off. Patient has a legal guardian per chart, consider reaching out to legal guardian for decision making although patient can assent to treatment.   Disposition: No evidence of imminent risk to self or others at present.  Patient does not meet criteria for psychiatric inpatient admission. Supportive therapy provided about ongoing stressors. Refer to IOP.  Maryagnes Amos, FNP 03/20/2023 4:13 PM

## 2023-03-20 NOTE — Progress Notes (Signed)
SLP Cancellation Note  Patient Details Name: Trevor Montgomery MRN: [id number] DOB: December 07, 1960   Cancelled treatment:        Attempted to see pt for ongoing dysphagia management/reassessment.  RN confirms one further episode of vomiting yesterday, but has since resolved.  At time of attempt, pt in room with family and provider in disussion.  SLP will return as schedule permits.   Kerrie Pleasure, MA, CCC-SLP Acute Rehabilitation Services Office: [phone] 03/20/2023, 12:37 PM

## 2023-03-20 NOTE — Progress Notes (Signed)
Speech Language Pathology Treatment: Dysphagia  Patient Details Name: Trevor Montgomery MRN: [id number] DOB: Jul 22, 1961 Today's Date: 03/20/2023 Time: 4098-1191 SLP Time Calculation (min) (ACUTE ONLY): 15 min  Assessment / Plan / Recommendation Clinical Impression  Visited pt and sister at lunch. Pt alert, upright and self feeding. Pt able to masticate soft solids and take sips of liquids. There was an occasional congested sound after sips and delayed coughing. Pt currently with EEG lead in place and couldn't go down for MBS, but will tentatively plan for it tomorrow. Sister at bedside was able to report that pts mentation is close to baseline. 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 He is independent with self care at home, helps her take care of the cats and [employer] cleaning. He is verbose and needs frequent redirection. No f/u for cognition at this time but will continue to address swallowing assessment. Pt may continue current diet for now.   HPI HPI: Pt is a 62 y.o. male who presented secondary to becoming poorly responsive. EMS noted pt to have right gaze deviation and jerking of upper extremities concerning for seizures; pt loaded with Keppra and Ativan. CT head 6/20: Hyperdense lesion in the posterior aspect of the right temporal lobe suspicious for underlying cavernoma. MRI pending at time of evaluation. CT chest 6/20: Likely debris within the trachea and both mainstem bronchi suggesting aspiration. This was also noted on 6/2. PMH: metastatic lung cancer on palliative chemoradiotherapy, HTN, HLD, COPD, provoked seizures in the setting of electrolyte imbalance and on Keppra 500 BID who was at his baseline. BSE and treatment 6/2 and 6/3 and a regular texture diet with thin liquids was ultimately recommended      SLP Plan  MBS      Recommendations for follow up therapy are one component of a multi-disciplinary discharge planning process, led by the attending physician.  Recommendations  may be updated based on patient status, additional functional criteria and insurance authorization.    Recommendations  Diet recommendations: Dysphagia 3 (mechanical soft);Thin liquid Liquids provided via: Straw;Cup Medication Administration: Crushed with puree Supervision: Full supervision/cueing for compensatory strategies Compensations: Slow rate;Small sips/bites Postural Changes and/or Swallow Maneuvers: Seated upright 90 degrees                  Oral care BID   Frequent or constant Supervision/Assistance Dysphagia, unspecified (R13.10);Cognitive communication deficit (R41.841)     MBS    Harlon Ditty, MA CCC-SLP  Acute Rehabilitation Services Secure Chat Preferred Office [phone]  Claudine Mouton  03/20/2023, 1:33 PM

## 2023-03-20 NOTE — Progress Notes (Signed)
Patient had a single discharge concern for seizure yesterday evening.  Given this, I would favor increasing Vimpat slightly, I have given a single dose of 100 mg and increase to his regular dose to 150 twice daily.  Ritta Slot, MD [facility] [phone]  If 7pm- 7am, please page neurology on call as listed in AMION.

## 2023-03-20 NOTE — Consult Note (Signed)
Consultation Note Date: 03/20/2023   Patient Name: Trevor Montgomery  DOB: [birth year]  MRN: [id number]  Age / Sex: 62 y.o., male  PCP: Trevor Reichmann, MD Referring Physician: Rolly Salter, MD  Reason for Consultation: Establishing goals of care  HPI/Patient Profile: 62 y.o. 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  Patient found to have metastatic brain lesions.  Patient unable to tolerate steroids due to agitation.  Seizures attributed to hyponatremia.  AEDs changed to Vimpat.  Patient also being treated for aspiration pneumonia.  PMT consulted to discuss goals of care.  Clinical Assessment and Goals of Care: I have reviewed medical records including EPIC notes, labs and imaging, assessed the patient and then met with patient to discuss diagnosis prognosis, GOC, EOL wishes, disposition and options.  Discussed case with Dr. Allena Montgomery prior to reaching out to family.  I introduced Palliative Medicine as specialized medical care for people living with serious illness. It focuses on providing relief from the symptoms and stress of a serious illness. The goal is to improve quality of life for both the patient and the family.  Patient denies complaints.  He is sitting in bed with EEG leads attached. He feels well. He is eating - ate majority of food on plate. When I ask him about the situation tells me he is not sure what happened he just remembers he was making chili and then was at the hospital.  When I asked him to tell me about what he understands about the reason he is in the hospital he starts to talk about the medication Prevagen and how the doctor told him it was "snake oil".  I did ask him if his Sister Trevor Montgomery helps him make medical decisions and he tells me yes.  He gives me permission to call  her.  I called Trevor Montgomery and introduced palliative care.  She tells me she is open to having goals of care conversations but prefers to do this in person.  She tells me she will be present tomorrow in the hospital and asks if we can meet then.  She does share a little bit about Trevor Montgomery.  She tells me she lives with him as well as her son.  She tells me of some financial concerns and this is really concerning to her at this point.  We discussed potential options for Trevor Montgomery moving forward and she expresses understanding -to be discussed in further detail tomorrow.  In addition to financial concerns she also has concerns about Trevor Montgomery getting the help he will need outpatient. She tells me she has heart issues and is unsure how much support she can provide.   Discussed with Trevor Montgomery the importance of continued conversation with family and the medical providers regarding overall plan of care and treatment options, ensuring decisions are within the context of the patients values and GOCs.    Questions and concerns were addressed. The family was encouraged to call with questions or concerns.  Primary Decision Maker LEGAL GUARDIAN - sister - Trevor Montgomery    SUMMARY OF RECOMMENDATIONS   - ongoing goals of care discussions - sister will be present at the hospital tomorrow ~11 am to follow up with my coworker - complicated social situation, will need ongoing support - no outstanding symptom management needs at this time - per psych note, patient's sister would prefer patient to return  home with appropriate support  Code Status/Advance Care Planning: DNR     Primary Diagnoses: Present on Admission:  Hypertension  Schizophrenia (HCC)  Hyponatremia  Aspiration pneumonia (HCC)  Chronic hypoxemic respiratory failure (HCC)  Chronic obstructive lung disease (HCC)  Hypokalemia  Metastatic lung cancer (metastasis from lung to other site) [facility])   I have reviewed the medical record, interviewed the  patient and family, and examined the patient. The following aspects are pertinent.  Past Medical History:  Diagnosis Date   Anxiety    Arthritis    Emphysema of lung (HCC)    HTN (hypertension)    Hyperlipemia    Schizoaffective disorder, bipolar type (HCC)    Substance abuse (HCC)    Social History   Socioeconomic History   Marital status: Single    Spouse name: Not on file   Number of children: Not on file   Years of education: Not on file   Highest education level: Not on file  Occupational History   Not on file  Tobacco Use   Smoking status: Former    Packs/day: 0.50    Years: 40.00    Additional pack years: 0.00    Total pack years: 20.00    Types: Cigarettes    Quit date: 10/15/2022    Years since quitting: 0.4   Smokeless tobacco: Former    Quit date: 10/15/2022  Vaping Use   Vaping Use: Not on file  Substance and Sexual Activity   Alcohol use: No   Drug use: Not Currently   Sexual activity: Not Currently  Other Topics Concern   Not on file  Social History Narrative   Not on file   Social Determinants of Health   Financial Resource Strain: Low Risk  (11/11/2022)   Overall Financial Resource Strain (CARDIA)    Difficulty of Paying Living Expenses: Not very hard  Food Insecurity: No Food Insecurity (03/14/2023)   Hunger Vital Sign    Worried About Running Out of Food in the Last Year: Never true    Ran Out of Food in the Last Year: Never true  Transportation Needs: No Transportation Needs (03/14/2023)   PRAPARE - Administrator, Civil Service (Medical): No    Lack of Transportation (Non-Medical): No  Physical Activity: Inactive (11/11/2022)   Exercise Vital Sign    Days of Exercise per Week: 0 days    Minutes of Exercise per Session: 0 min  Stress: Not on file  Social Connections: Unknown (11/11/2022)   Social Connection and Isolation Panel [NHANES]    Frequency of Communication with Friends and Family: More than three times a week     Frequency of Social Gatherings with Friends and Family: More than three times a week    Attends Religious Services: Not on Marketing executive or Organizations: No    Attends Banker Meetings: Never    Marital Status: Never married   Family History  Problem Relation Age of Onset   Stroke Father    Seizures Father    Stroke Sister    Cancer Maternal Grandmother    Scheduled Meds:  enoxaparin (LOVENOX) injection  40 mg Subcutaneous QHS   fluPHENAZine  5 mg Oral Daily   guaiFENesin  1,200 mg Oral BID   lactulose  20 g Oral Daily   LORazepam  0.5 mg Intravenous Once   magnesium oxide  800 mg Oral BID   pantoprazole  40 mg Oral BID AC  potassium chloride SA  20 mEq Oral BID   propranolol  5 mg Oral BID   sodium chloride  1 g Oral TID   Continuous Infusions:  sodium chloride 100 mL/hr at 03/20/23 1338   lacosamide (VIMPAT) IV     PRN Meds:.acetaminophen **OR** acetaminophen, benztropine, haloperidol lactate, hydrALAZINE, ipratropium-albuterol, labetalol, LORazepam, ondansetron **OR** ondansetron (ZOFRAN) IV, mouth rinse, polyethylene glycol Allergies  Allergen Reactions   Fluphenazine     Other Reaction(s): Unknown   Review of Systems  Constitutional:  Positive for activity change. Negative for appetite change.  HENT:  Negative for trouble swallowing.   Respiratory:  Negative for shortness of breath.   Psychiatric/Behavioral:  Positive for confusion.     Physical Exam Constitutional:      General: He is not in acute distress.    Appearance: He is ill-appearing.  Pulmonary:     Effort: Pulmonary effort is normal.  Skin:    General: Skin is warm and dry.  Neurological:     Mental Status: He is alert and oriented to person, place, and time.  Psychiatric:        Attention and Perception: Attention normal.        Mood and Affect: Mood normal.        Speech: Speech is tangential.        Behavior: Behavior is cooperative.     Vital Signs: BP  134/79   Pulse 88   Temp 97.6 F (36.4 C) (Oral)   Resp 18   Ht 5\' 9"  (1.753 m)   SpO2 98%   BMI 31.51 kg/m  Pain Scale: 0-10   Pain Score: 0-No pain   SpO2: SpO2: 98 % O2 Device:SpO2: 98 % O2 Flow Rate: .O2 Flow Rate (L/min): 2 L/min  IO: Intake/output summary:  Intake/Output Summary (Last 24 hours) at 03/20/2023 1850 Last data filed at 03/20/2023 1554 Gross per 24 hour  Intake 35 ml  Output 200 ml  Net -165 ml    LBM: Last BM Date : 03/19/23 Baseline Weight:   Most recent weight:       Palliative Assessment/Data: PPS 60%     *Please note that this is a verbal dictation therefore any spelling or grammatical errors are due to the "Dragon Medical One" system interpretation.   Gerlean Ren, DNP, AGNP-C Palliative Medicine Team [phone] Pager: [phone]

## 2023-03-20 NOTE — Progress Notes (Signed)
S: Awake and alert. LTM EEG leads in place.   O: BP 127/82 (BP Location: Right Arm)   Pulse (!) 105   Temp 98.3 F (36.8 C) (Oral)   Resp 16   Ht 5\' 9"  (1.753 m)   SpO2 98%   BMI 31.51 kg/m   General exam:  HEENT: [place]/AT. EEG leads in place Lungs: Respirations unlabored Ext: No edema  Neuro: Ment: Awake and alert. Bradyphrenic. Oriented x 5. Speech is slow but fluent and nondysarthric. Naming intact. Perseverates on the topic of vitamins and "you want to stay healthy?" As well as repeating several times "People need to stop killing each other", during interview.  CN: PERRL. EOMI. No nystagmus. Mild left facial droop. Phonation intact.  Motor: 4+/5 BUE. Will move BLE by extending at knees and elevating slightly off the bed, otherwise noncooperative.  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 On presentation to [facility] ED patient had persistent right gaze deviation received Keppra 4 g and Ativan 4 mg with resultant improvement with right gaze deviation. 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 Neurology was consulted and patient transferred to [facility] for continuous EEG for further evaluation. He was transferred to [facility] for further management. Neurology was called back to see due to staring episodes on Wednesday 03/20/23.   - LTM has been placed. No clinical seizure activity on bedside exam. He is awake and alert with bradykinesia and bradyphrenia as well as perseverative speech.  - CTA of head and neck on 6/22 showed no  LVO. Redemonstrated was a hypodense lesion in the lateral right frontal lobe, new compared to 02/23/2023. Redemonstrated was a hyperdense lesion in the posterior right temporal lobe with slight interval increase in surrounding vasogenic edema. There was a possible additional contrast-enhancing lesion in the posterior left temporal lobe. Redemonstrated were multiple sclerotic regions in the cervical and visualized thoracic vertebral bodies, unchanged from prior exam, concerning for metastatic disease. - Continue to monitor. LTM EEG official read pending for this morning.   Electronically signed: Dr. Caryl Pina

## 2023-03-20 NOTE — Progress Notes (Signed)
vLTM maintenance  All impedances below 10k.  No skin breakdown at  F7 T3 A1 01

## 2023-03-20 NOTE — Progress Notes (Signed)
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 On presentation to [facility] ED patient had persistent right gaze deviation received Keppra 4 g and Ativan 4 mg with resultant improvement with right gaze deviation however remains significantly obtunded. 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 Neurology consulted and patient transferred to [facility] for continuous EEG for further evaluation. 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  Was given Decadron which she was not able to tolerate due to ongoing agitation.  Now having intractable nausea and vomiting issues.  Assessment and Plan: Recurrent seizures. Recent episode in early June. Readmission this time around. Neurology was consulted. Think seizures provoked secondary to hyponatremia. EEG/LTM negative for any active seizures. Was on Keppra increased to 1000 mg twice daily. Now per discussion with neuro-oncology patient will be on Vimpat. Neurology reconsulted as the patient appears to have episodes of staring spell both on 6/26 as well as 6/25. LTM EEG did capture a possible seizure spike on 6/26.  Vimpat dose increased to 150 mg twice daily per discussion with neurology.  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  With vasogenic edema On further workup of his seizures CT head was done which showed a hyperdense lesion in the right temporal  lobe and a hypodense lesion in the right cerebellum. CT angiogram head and neck as well as CT venogram concerning for metastatic lesions with vasogenic edema. 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 Neuro-oncology was earlier consulted. I discussed with neuro-oncology on 6/26.  Patient will be discussed on tumor board.  Patient may require whole brain radiation.  Prognosis not clear as neuro-oncology would like repeat imaging with contrast. But with the fact that the patient already has stage IV small cell lung cancer and now has brain metastasis on maintenance chemotherapy prognosis is poor.  Hyponatremia. SIADH. Possibility of association with psychiatric medications with dry mouth as well. Currently corrected. At risk of further recurrence.  Hypokalemia. Hypomagnesemia. Replaced.  Aspiration pneumonia. Currently on IV Unasyn. Repeat x-ray on 6/26 does not show any evidence of worsening pneumonia but examination reveals bilateral expiratory wheezing and rhonchi and secretions in the upper airway. Cultures negative. CT shows evidence of tracheal debris's. Appreciate SLP follow-up. Prognosis is poor.  Schizophrenia. Patient is a schizophrenia and has been on Prolixin. Was also on Cogentin which is now switched to as needed. Due to his ongoing agitation as well as concern for hallucination psychiatry is reconsulted for further assistance. Appreciate assistance.  Given that the patient has been stable from schizophrenia point of view for so long, therefore his current presentation is more likely attributed to his organic causes per psychiatric.  Acute encephalopathy likely combination of toxic and metabolic as well as presence of hospital induced delirium, schizophrenia and brain metastatic lesions. Paraneoplastic syndrome cannot be ruled out as well. Patient with ongoing agitation.  Perseverance about same topic and concern for possible hallucination. Not participating  with physical therapy. Not eating. This does not  bode well for his diagnosis of cancer with his need for radiation and chemotherapy. There was concern for Decadron induced confusion as well. There is concern for Keppra induced confusion and agitation and therefore we will switch it to Vimpat per discussion with neuro-oncology. Currently has ordered for as needed Haldol. Psychiatry consulted.  For further assistance.  Goals of care conversation. Patient is not in a condition to have any conversation with regards to his goals of care. Discussed with sister who is the next of kin. Patient reportedly has mentioned that he would prefer comfort for his primary goal. He has schizophrenia and unable to tolerate medications for cerebral edema like Decadron with resultant agitation. Has stage IV lung cancer and now appears to have metastatic brain lesions for which she will require further therapy. Currently not participating in therapy in the hospital not participating with nutrition in the hospital. Concern is that patient's condition will continue to deteriorate further. Perative care consulted. Discussed with family and recommended consideration for hospice given his multiple comorbidities.  Intractable nausea and vomiting. Etiology not clear. X-ray abdomen unremarkable.  Nausea vomiting currently resolved.  Monitor.  Tachycardia and elevated blood pressure. Improved.  Now blood pressure is soft.  Will be giving IV fluids.  Anxiety. On Inderal 5 mg twice daily.  Continue.   Subjective: No nausea no vomiting no fever no chills.  Blood pressure was soft.  Reports dizziness.  No chest pain.  No abdominal pain.  Physical Exam: General: in Mild distress, No Rash Cardiovascular: S1 and S2 Present, No Murmur Respiratory: Good respiratory effort, Bilateral Air entry present. No Crackles, No wheezes Abdomen: Bowel Sound present, No tenderness Extremities: No edema Neuro: Alert and oriented x  self, no new focal deficit  Data Reviewed: I have Reviewed nursing notes, Vitals, and Lab results. Since last encounter, pertinent lab results CBC and BMP   . I have ordered test including CBC and BMP  . I have discussed pt's care plan and test results with neurology, palliative care  .   Disposition: Status is: Inpatient Remains inpatient appropriate because: Awaiting improvement in mentation and further workup for his seizures.  enoxaparin (LOVENOX) injection 40 mg Start: 03/15/23 2200   Family Communication: Sister at bedside Level of care: Telemetry Cardiac   Vitals:   03/20/23 1115 03/20/23 1130 03/20/23 1300 03/20/23 1533  BP: (!) 82/56 106/63 110/63 134/79  Pulse: (!) 102 89 94 88  Resp: 18   18  Temp: 98 F (36.7 C)   97.6 F (36.4 C)  TempSrc: Oral   Oral  SpO2: 97% 96%  98%  Height:         Author: Lynden Oxford, MD 03/20/2023 4:48 PM  Please look on [email or web address] to find out who is on call.

## 2023-03-20 NOTE — Procedures (Addendum)
Patient Name: Trevor Montgomery  MRN: [id number]  Epilepsy Attending: Charlsie Quest  Referring Physician/Provider: Caryl Pina, MD  Duration: 03/19/2023 2213 to 03/20/2023 2213   Patient history: 62 yo M with breakthrough seizure getting eeg to evaluate for seizure.   Level of alertness: awake, asleep   AEDs during EEG study: LCM   Technical aspects: This EEG study was done with scalp electrodes positioned according to the 10-20 International system of electrode placement. Electrical activity was reviewed with band pass filter of 1-70Hz , sensitivity of 7 uV/mm, display speed of 45mm/sec with a 60Hz  notched filter applied as appropriate. EEG data were recorded continuously and digitally stored.  Video monitoring was available and reviewed as appropriate.   Description: The posterior dominant rhythm consists of 8 Hz activity of moderate voltage (25-35 uV) seen predominantly in posterior head regions, symmetric and reactive to eye opening and eye closing. Sleep was characterized by vertex waves, sleep spindles (12 to 14 Hz), maximal frontocentral region. Intermittent generalized and maximal right frontotemporal region 3-5 hz theta- delta slowing was seen.   On 03/19/2023 at around 2049, EEG showed 3 to 6 Hz theta-delta slowing in right fronto-temporal region which appeared sharply contoured, rhythmic with waxing and waning morphology.  On video, patient was talking to wife but sounded confused asking where he is .  Although no definite evolution was seen, this pattern is concerning and was likely ictal.  Hyperventilation and photic stimulation were not performed.      ABNORMALITY -Intermittent slow, generalized and maximal right fronto-temporal region   IMPRESSION: This study showed evidence of cortical dysfunction arising from right fronto-temporal region. Additionally there is mild diffuse encephalopathy, nonspecific etiology.   On 03/19/2023 at around 2049, EEG showed waxing and waning rhythmic  pattern in right frontotemporal region.  Concomitantly on video patient was confused, asking where he is.  Also no definite evolution was seen, this pattern is concerning and was likely ictal in nature.  Clinical correlation is recommended.  Dr. Amada Jupiter was notified.   Trevor Montgomery

## 2023-03-20 NOTE — Progress Notes (Signed)
Occupational Therapy Treatment Patient Details Name: Trevor Montgomery MRN: [id number] DOB: Dec 13, 1960 Today's Date: 03/20/2023   History of present illness 62 y.o. male presents to [facility] hospital on 03/13/2023 with concern for possible seizures. 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 PMH includes metastatic lung cancer, HTN, HLD, COPD, seizures.   OT comments  Patient demonstrating gains this treatment session with more cooperation towards therapy. Patient performed standing at bedside and began having BM and was assisted with hygiene. Patient performed standing and transfers with rollator use and cues for safety and min  to min/mod assist. Patient agreeable to staying up in recliner at end of session. Discharge recommendations continues to be appropriate. Acute OT to continue to follow.     Recommendations for follow up therapy are one component of a multi-disciplinary discharge planning process, led by the attending physician.  Recommendations may be updated based on patient status, additional functional criteria and insurance authorization.    Assistance Recommended at Discharge Frequent or constant Supervision/Assistance  Patient can return home with the following  A little help with walking and/or transfers;A little help with bathing/dressing/bathroom;Direct supervision/assist for medications management;Direct supervision/assist for financial management   Equipment Recommendations  None recommended by OT    Recommendations for Other Services      Precautions / Restrictions Precautions Precautions: Fall Precaution Comments: seizures; EEG Restrictions Weight Bearing Restrictions: No       Mobility Bed Mobility Overal bed mobility: Needs Assistance Bed Mobility: Supine to Sit     Supine to sit: Min guard, +2 for safety/equipment, HOB elevated     General bed mobility comments: patient required increased time to scoot buttocks to EOB     Transfers Overall transfer level: Needs assistance Equipment used: Rollator (4 wheels), None Transfers: Sit to/from Stand, Bed to chair/wheelchair/BSC Sit to Stand: Min assist, +2 safety/equipment     Step pivot transfers: Min assist, +2 safety/equipment     General transfer comment: tood multiple times from EOB and ended session with transfer to recliner with min assist     Balance Overall balance assessment: Needs assistance Sitting-balance support: No upper extremity supported, Feet supported Sitting balance-Leahy Scale: Good     Standing balance support: No upper extremity supported, Bilateral upper extremity supported Standing balance-Leahy Scale: Poor Standing balance comment: standing with and without UE support wwith posterior sway                           ADL either performed or assessed with clinical judgement   ADL Overall ADL's : Needs assistance/impaired                             Toileting- Clothing Manipulation and Hygiene: Maximal assistance;Sit to/from stand Toileting - Clothing Manipulation Details (indicate cue type and reason): max assist while standing at EOB due to sudden bowel movement while standing       General ADL Comments: increased participation with self care and therapy    Extremity/Trunk Assessment              Vision       Perception     Praxis      Cognition Arousal/Alertness: Awake/alert Behavior During Therapy: WFL for tasks assessed/performed Overall Cognitive Status: Impaired/Different from baseline Area of Impairment: Attention, Following commands, Safety/judgement, Awareness, Problem solving  Current Attention Level: Selective   Following Commands: Follows one step commands consistently, Follows one step commands with increased time, Follows multi-step commands inconsistently Safety/Judgement: Decreased awareness of safety, Decreased awareness of  deficits Awareness: Intellectual Problem Solving: Slow processing, Difficulty sequencing, Decreased initiation, Requires verbal cues, Requires tactile cues General Comments: requiring cues to redirect due to tangential speech        Exercises      Shoulder Instructions       General Comments on RA during session with SpO2 >=91%    Pertinent Vitals/ Pain       Pain Assessment Pain Assessment: No/denies pain  Home Living                                          Prior Functioning/Environment              Frequency  Min 2X/week        Progress Toward Goals  OT Goals(current goals can now be found in the care plan section)  Progress towards OT goals: Progressing toward goals  Acute Rehab OT Goals Patient Stated Goal: get better OT Goal Formulation: With patient Time For Goal Achievement: 03/29/23 Potential to Achieve Goals: Good ADL Goals Pt Will Perform Grooming: with set-up;standing Pt Will Perform Lower Body Dressing: with set-up;sit to/from stand Pt Will Transfer to Toilet: with set-up;ambulating Additional ADL Goal #1: Pt will follow one step commands consistently throughout session.  Plan Discharge plan remains appropriate    Co-evaluation    PT/OT/SLP Co-Evaluation/Treatment: Yes Reason for Co-Treatment: Necessary to address cognition/behavior during functional activity PT goals addressed during session: Mobility/safety with mobility;Balance;Proper use of DME;Strengthening/ROM OT goals addressed during session: ADL's and self-care      AM-PAC OT "6 Clicks" Daily Activity     Outcome Measure   Help from another person eating meals?: A Little Help from another person taking care of personal grooming?: A Little Help from another person toileting, which includes using toliet, bedpan, or urinal?: A Little Help from another person bathing (including washing, rinsing, drying)?: A Little Help from another person to put on and taking  off regular upper body clothing?: A Little Help from another person to put on and taking off regular lower body clothing?: A Little 6 Click Score: 18    End of Session Equipment Utilized During Treatment: Gait belt;Rollator (4 wheels)  OT Visit Diagnosis: Other abnormalities of gait and mobility (R26.89);Other symptoms and signs involving cognitive function   Activity Tolerance Patient tolerated treatment well   Patient Left in chair;with chair alarm set;with call bell/phone within reach   Nurse Communication Mobility status        Time: 1003-1035 OT Time Calculation (min): 32 min  Charges: OT General Charges $OT Visit: 1 Visit OT Treatments $Self Care/Home Management : 8-22 mins  Trevor Montgomery, OTA Acute Rehabilitation Services  Office [phone]   Dewain Penning 03/20/2023, 1:57 PM

## 2023-03-20 NOTE — Progress Notes (Signed)
Physical Therapy Treatment Patient Details Name: Trevor Montgomery MRN: [id number] DOB: June 22, 1961 Today's Date: 03/20/2023   History of Present Illness 62 y.o. male presents to [facility] hospital on 03/13/2023 with concern for possible seizures. 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 PMH includes metastatic lung cancer, HTN, HLD, COPD, seizures.    PT Comments    Pt was much more agreeable to OOB mobility today once engaged in a conversation about comic books. Provided pt with the comics from a newspaper for him to read in standing to promote standing balance and activity tolerance. Pt displays a posterior sway today and increased weakness and endurance deficits after declining to get OOB the past few days. As a result, he is needing more assistance for functional mobility, minA for transfers and min-modA to ambulate with UE support at this time. Pt positioned in reclined chair with chair alarm set at end of session to promote OOB tolerance and improve pulmonary function. Will continue to follow acutely.     Recommendations for follow up therapy are one component of a multi-disciplinary discharge planning process, led by the attending physician.  Recommendations may be updated based on patient status, additional functional criteria and insurance authorization.  Follow Up Recommendations       Assistance Recommended at Discharge Frequent or constant Supervision/Assistance  Patient can return home with the following A lot of help with walking and/or transfers;A lot of help with bathing/dressing/bathroom;Direct supervision/assist for medications management;Direct supervision/assist for financial management;Assist for transportation;Help with stairs or ramp for entrance   Equipment Recommendations  None recommended by PT    Recommendations for Other Services       Precautions / Restrictions Precautions Precautions: Fall Precaution Comments: seizures;  EEG Restrictions Weight Bearing Restrictions: No     Mobility  Bed Mobility Overal bed mobility: Needs Assistance Bed Mobility: Supine to Sit     Supine to sit: Min guard, +2 for safety/equipment, HOB elevated     General bed mobility comments: Extra time to scoot buttocks to EOB but pt able to sit up R EOB from elevated HOB with use of rails at a min guard assist level.    Transfers Overall transfer level: Needs assistance Equipment used: Rollator (4 wheels), None Transfers: Sit to/from Stand, Bed to chair/wheelchair/BSC Sit to Stand: Min assist, +2 safety/equipment   Step pivot transfers: Min assist, +2 safety/equipment       General transfer comment: MinA to power up to stand multiple times from EOB to the rollator, displaying a posterior bias this date. MinA to perform step pivot transfer to R from bed to recliner with pt holding onto furniture for support.    Ambulation/Gait Ambulation/Gait assistance: Mod assist, Min assist Gait Distance (Feet): 3 Feet Assistive device: Rollator (4 wheels), None Gait Pattern/deviations: Step-through pattern, Decreased stride length, Trunk flexed, Narrow base of support Gait velocity: reduced Gait velocity interpretation: <1.31 ft/sec, indicative of household ambulator   General Gait Details: Pt limited in mobility distance by EEG lines, but did march in place with the rollator for support with intermittent posterior sway, needing modA to recover. He also took several pivotal steps to the R from bed to recliner while just holding onto furniture for support, miNA for balance.   Stairs             Wheelchair Mobility    Modified Rankin (Stroke Patients Only)       Balance Overall balance assessment: Needs assistance Sitting-balance support: No upper extremity supported, Feet  supported Sitting balance-Leahy Scale: Good     Standing balance support: No upper extremity supported, Bilateral upper extremity  supported Standing balance-Leahy Scale: Poor Standing balance comment: Pt with posterior sway, needing intermittent modA to recover/maintain balance when standing with feet static and no UE support. Min-modA for balance with UE support while taking steps.                            Cognition Arousal/Alertness: Awake/alert Behavior During Therapy: WFL for tasks assessed/performed Overall Cognitive Status: Impaired/Different from baseline Area of Impairment: Attention, Following commands, Safety/judgement, Awareness, Problem solving                   Current Attention Level: Selective   Following Commands: Follows one step commands consistently, Follows one step commands with increased time, Follows multi-step commands inconsistently Safety/Judgement: Decreased awareness of safety, Decreased awareness of deficits Awareness: Intellectual Problem Solving: Slow processing, Difficulty sequencing, Decreased initiation, Requires verbal cues, Requires tactile cues General Comments: Pt with tangential speech in regards to comic books, needing cues to redirect him to progress mobility at times, but this topic did allow pt to get more engaged with the therapists.        Exercises General Exercises - Lower Extremity Hip Flexion/Marching: AROM, Strengthening, Both, 5 reps, Standing (with rollator for support)    General Comments General comments (skin integrity, edema, etc.): SpO2 >/= 91% on RA throughout, re-donned Foster end of session though      Pertinent Vitals/Pain Pain Assessment Pain Assessment: No/denies pain    Home Living                          Prior Function            PT Goals (current goals can now be found in the care plan section) Acute Rehab PT Goals Patient Stated Goal: to read comic books PT Goal Formulation: With patient Time For Goal Achievement: 03/28/23 Potential to Achieve Goals: Fair Progress towards PT goals: Progressing toward  goals    Frequency    Min 3X/week      PT Plan Current plan remains appropriate    Co-evaluation PT/OT/SLP Co-Evaluation/Treatment: Yes Reason for Co-Treatment: Necessary to address cognition/behavior during functional activity PT goals addressed during session: Mobility/safety with mobility;Balance;Proper use of DME;Strengthening/ROM        AM-PAC PT "6 Clicks" Mobility   Outcome Measure  Help needed turning from your back to your side while in a flat bed without using bedrails?: A Little Help needed moving from lying on your back to sitting on the side of a flat bed without using bedrails?: A Little Help needed moving to and from a bed to a chair (including a wheelchair)?: A Little Help needed standing up from a chair using your arms (e.g., wheelchair or bedside chair)?: A Little Help needed to walk in hospital room?: Total (<20 ft today) Help needed climbing 3-5 steps with a railing? : Total 6 Click Score: 14    End of Session Equipment Utilized During Treatment: Gait belt;Oxygen Activity Tolerance: Patient tolerated treatment well Patient left: with call bell/phone within reach;in chair;with chair alarm set   PT Visit Diagnosis: Other abnormalities of gait and mobility (R26.89);Muscle weakness (generalized) (M62.81);Unsteadiness on feet (R26.81);Difficulty in walking, not elsewhere classified (R26.2)     Time: 1610-9604 PT Time Calculation (min) (ACUTE ONLY): 33 min  Charges:  $Therapeutic Activity: 8-22 mins  Raymond Gurney, PT, DPT Acute Rehabilitation Services  Office: [phone]    Jewel Baize 03/20/2023, 11:37 AM

## 2023-03-21 ENCOUNTER — Inpatient Hospital Stay (HOSPITAL_COMMUNITY): Payer: Medicare Other

## 2023-03-21 DIAGNOSIS — R569 Unspecified convulsions: Secondary | ICD-10-CM | POA: Diagnosis not present

## 2023-03-21 LAB — VITAMIN B1: Vitamin B1 (Thiamine): 96.7 nmol/L (ref 66.5–200.0)

## 2023-03-21 MED ORDER — AMOXICILLIN-POT CLAVULANATE 875-125 MG PO TABS
1.0000 | ORAL_TABLET | Freq: Two times a day (BID) | ORAL | Status: AC
  Administered 2023-03-21 – 2023-03-22 (×3): 1 via ORAL
  Filled 2023-03-21 (×3): qty 1

## 2023-03-21 MED ORDER — LACTULOSE 10 GM/15ML PO SOLN
10.0000 g | Freq: Every day | ORAL | Status: DC
  Administered 2023-03-21 – 2023-03-24 (×4): 10 g via ORAL
  Filled 2023-03-21 (×4): qty 30

## 2023-03-21 NOTE — Progress Notes (Signed)
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 On presentation to [facility] ED patient had persistent right gaze deviation received Keppra 4 g and Ativan 4 mg with resultant improvement with right gaze deviation however remains significantly obtunded. 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 Neurology consulted and patient transferred to [facility] for continuous EEG for further evaluation. 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  Was given Decadron which she was not able to tolerate due to ongoing agitation.  Now having intractable nausea and vomiting issues.  Assessment and Plan: Recurrent seizures. Recent episode in early June. Readmission this time around. Neurology was consulted. Think seizures provoked secondary to hyponatremia. EEG/LTM negative for any active seizures. Was on Keppra increased to 1000 mg twice daily. Now per discussion with neuro-oncology patient will be on Vimpat. Neurology reconsulted as the patient appears to have episodes of staring spell both on 6/26 as well as 6/25. LTM EEG did capture a possible seizure spike on 6/26.  Vimpat dose increased to 150 mg twice daily per discussion with neurology. Per my discussion continue LTM EEG for 24 hours.  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  With vasogenic edema On further workup of his seizures CT head was done  which showed a hyperdense lesion in the right temporal lobe and a hypodense lesion in the right cerebellum. CT angiogram head and neck as well as CT venogram concerning for metastatic lesions with vasogenic edema. 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 Neuro-oncology was earlier consulted. I discussed with neuro-oncology on 6/26.  Patient will be discussed on tumor board.  Patient may require whole brain radiation.  Prognosis not clear as neuro-oncology would like repeat imaging with contrast. But with the fact that the patient already has stage IV small cell lung cancer and now has brain metastasis on maintenance chemotherapy prognosis is poor.  Hyponatremia. SIADH. Possibility of association with psychiatric medications with dry mouth as well. Currently corrected. At risk of further recurrence.  Hypokalemia. Hypomagnesemia. Replaced.  Aspiration pneumonia. Currently on IV Unasyn. Repeat x-ray on 6/26 does not show any evidence of worsening pneumonia but examination reveals bilateral expiratory wheezing and rhonchi and secretions in the upper airway. Cultures negative. CT shows evidence of tracheal debris's. Appreciate SLP follow-up. Prognosis is poor.  Schizophrenia. Patient is a schizophrenia and has been on Prolixin. Was also on Cogentin which is now switched to as needed. Due to his ongoing agitation as well as concern for hallucination psychiatry is reconsulted for further assistance. Appreciate assistance.  Given that the patient has been stable from schizophrenia point of view for so long, therefore his current presentation is more likely attributed to his organic causes per psychiatric.  Acute encephalopathy likely combination of toxic and metabolic as well as presence of hospital induced delirium, schizophrenia and brain metastatic lesions. Paraneoplastic syndrome cannot be ruled out as well. Patient with ongoing agitation.  Perseverance about same topic and  concern for possible hallucination. Not  participating with physical therapy. Not eating. This does not bode well for his diagnosis of cancer with his need for radiation and chemotherapy. There was concern for Decadron induced confusion as well. There is concern for Keppra induced confusion and agitation and therefore we will switch it to Vimpat per discussion with neuro-oncology. Currently has ordered for as needed Haldol. Psychiatry consulted.  For further assistance.  Goals of care conversation. Patient is not in a condition to have any conversation with regards to his goals of care. Discussed with sister who is the next of kin. Patient reportedly has mentioned that he would prefer comfort for his primary goal. He has schizophrenia and unable to tolerate medications for cerebral edema like Decadron with resultant agitation. Has stage IV lung cancer and now appears to have metastatic brain lesions for which she will require further therapy. Currently not participating in therapy in the hospital not participating with nutrition in the hospital. Concern is that patient's condition will continue to deteriorate further. Perative care consulted. Discussed with family and recommended consideration for hospice given his multiple comorbidities. Per palliative care family currently leaning towards home hospice.  Intractable nausea and vomiting. Etiology not clear. X-ray abdomen unremarkable.  Nausea vomiting currently resolved.  Monitor.  Tachycardia and elevated blood pressure. Improved.  Now blood pressure is soft.  Will be giving IV fluids.  Anxiety. On Inderal 5 mg twice daily.  Continue.   Subjective: No acute event.  No nausea vomiting fever no chills.  Mentation better.  No agitation.  Physical Exam: In mild distress. No rash. Clear to auscultation. S1-S2 present.  Data Reviewed: I have Reviewed nursing notes, Vitals, and Lab results. Discussed with neurology. Reviewed CBC and  BMP.  Reordered CBC and BMP.  Also discussed with palliative care.  Disposition: Status is: Inpatient Remains inpatient appropriate because: Awaiting improvement in mentation and further workup for his seizures.  enoxaparin (LOVENOX) injection 40 mg Start: 03/15/23 2200   Family Communication: Sister at bedside discussed on 6/27. Level of care: Telemetry Cardiac   Vitals:   03/21/23 0452 03/21/23 0718 03/21/23 1121 03/21/23 1524  BP: (!) 149/88 (!) 154/85 134/79 129/77  Pulse: 81 81 83 86  Resp: 16 17 18 18   Temp: 98.2 F (36.8 C) 98.1 F (36.7 C) 98.6 F (37 C) 97.6 F (36.4 C)  TempSrc: Axillary Oral Oral Oral  SpO2: 98% 98% 99% 97%  Weight:      Height:         Author: Lynden Oxford, MD 03/21/2023 6:44 PM  Please look on [email or web address] to find out who is on call.

## 2023-03-21 NOTE — TOC Progression Note (Signed)
Transition of Care [facility]) - Progression Note    Patient Details  Name: Trevor Montgomery MRN: [id number] Date of Birth: January 16, 1961  Transition of Care [facility] CM/SW Contact  Kermit Balo, RN Phone Number: 03/21/2023, 1:13 PM  Clinical Narrative:     Pt on LTEEG and needs MBS due to some coughing around meals. Plan continues to be for home when medically ready.  TOC following.  Expected Discharge Plan: Home w Home Health Services Barriers to Discharge: Continued Medical Work up  Expected Discharge Plan and Services   Discharge Planning Services: CM Consult Post Acute Care Choice: Home Health Living arrangements for the past 2 months: Single Family Home                 DME Arranged: Walker rolling with seat         HH Arranged: PT, Speech Therapy           Social Determinants of Health (SDOH) Interventions SDOH Screenings   Food Insecurity: No Food Insecurity (03/14/2023)  Housing: Low Risk  (03/14/2023)  Transportation Needs: No Transportation Needs (03/14/2023)  Utilities: Not At Risk (03/14/2023)  Alcohol Screen: Low Risk  (11/11/2022)  Depression (PHQ2-9): Low Risk  (11/11/2022)  Financial Resource Strain: Low Risk  (11/11/2022)  Physical Activity: Inactive (11/11/2022)  Social Connections: Unknown (11/11/2022)  Tobacco Use: Medium Risk (03/20/2023)    Readmission Risk Interventions     No data to display

## 2023-03-21 NOTE — Procedures (Addendum)
Patient Name: Jurrell Kintner  MRN: [id number]  Epilepsy Attending: Charlsie Quest  Referring Physician/Provider: Caryl Pina, MD  Duration: 03/20/2023 2213 to 03/21/2023 2213   Patient history: 62 yo M with breakthrough seizure getting eeg to evaluate for seizure.   Level of alertness: awake, asleep   AEDs during EEG study: LCM   Technical aspects: This EEG study was done with scalp electrodes positioned according to the 10-20 International system of electrode placement. Electrical activity was reviewed with band pass filter of 1-70Hz , sensitivity of 7 uV/mm, display speed of 32mm/sec with a 60Hz  notched filter applied as appropriate. EEG data were recorded continuously and digitally stored.  Video monitoring was available and reviewed as appropriate.   Description: The posterior dominant rhythm consists of 8 Hz activity of moderate voltage (25-35 uV) seen predominantly in posterior head regions, symmetric and reactive to eye opening and eye closing. Sleep was characterized by vertex waves, sleep spindles (12 to 14 Hz), maximal frontocentral region. Intermittent generalized and maximal right frontotemporal region 3-5 hz theta- delta slowing was seen. Spike were noted in right frontotemporal region, at times in runs lasting 2-4 seconds.    ABNORMALITY - Spike, right fronto-temporal region -Intermittent slow, generalized and maximal right fronto-temporal region   IMPRESSION: This study showed evidence of epileptogenicity and cortical dysfunction arising from right fronto-temporal region. Additionally there is mild diffuse encephalopathy, nonspecific etiology. No definite seizures were noted.    Virgene Tirone Annabelle Harman

## 2023-03-21 NOTE — Progress Notes (Signed)
SLP Cancellation Note  Patient Details Name: Trevor Montgomery MRN: [id number] DOB: 12-03-1960   Cancelled treatment:       Reason Eval/Treat Not Completed: Patient not medically ready. Pt now on continuous EEG due to seizures last night. He is still alert, mentation appearing as observed yesterday. Pt tolerating meal this am with NT. He does still have intermittent congested coughing. RN suggested increased oral care after meals which would always be a good practice and very welcome. Cannot complete MBS until EEG discontinued. Will f/u for readiness, likely Monday.    Babara Buffalo, Riley Nearing 03/21/2023, 8:56 AM

## 2023-03-21 NOTE — Progress Notes (Signed)
Palliative:  HPI: 62 y.o. 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  Patient found to have metastatic brain lesions.  Patient unable to tolerate steroids due to agitation.  Seizures attributed to hyponatremia.  AEDs changed to Vimpat.  Patient also being treated for aspiration pneumonia.  PMT consulted to discuss goals of care.   I met today with Trevor Montgomery and sister/HCPOA, Trevor Montgomery, at bedside. We have a long discussion regarding goals of care and path forward. We discussed goals of care and we completed MOST form: DNR, comfort measures, determine use/limitations of antibiotics, no IVF, no feeding tube. Confirmed goal is for Trevor Montgomery to remain happy and comfortable. He would like to return home. Trevor Montgomery is most concerned with disposition and we discussed options from here. He would be eligible for hospice at home given his cancer progression and goals for comfort care. I do not believe he is eligible for hospice facility at this point. Trevor Montgomery has some health concerns herself and is worried about being able to care for him at home. She is accepting of hospice support and has had this in the past for her mother. She feels this would be helpful for Trevor Montgomery but still worries about caring for him at home with his declined functional state compared to his state prior to admission. We also reviewed that having hospice at home would also be foregoing further cancer treatment. We also reviewed SNF rehab although he would need to be approved for stay from insurance. Trevor Montgomery seems to be leaning towards home with hospice but wants some more time to consider. She asks about timing of discharge and I explained that he is likely nearing the point of being optimized what we can fix in the hospital. I anticipate readiness for discharge over the coming days but reiterated that this is up to  attending provider. Trevor Montgomery expresses understanding.   All questions/concerns addressed. Emotional support provided. Discussed with Dr. Allena Katz.   Exam: Alert, confused, difficult to focus on conversation. No distress. Continuous EEG. Breathing regular, unlabored. Abd flat. Moves all extremities.   Plan: - DNR - Most completed (see above) - Anticipate likely home with hospice  50 min  Yong Channel, NP Palliative Medicine Team Pager [phone] (Please see [email or web address] for schedule) Team Phone [phone]    Greater than 50%  of this time was spent counseling and coordinating care related to the above assessment and plan

## 2023-03-21 NOTE — Progress Notes (Signed)
LTM maint complete - no skin breakdown under: fp1,fp2,fz

## 2023-03-21 NOTE — Progress Notes (Signed)
LTM maint complete - no skin breakdown under: F7 F8 A2 Fp1 Serviced Fp1 F7 F8 A2  [facility] monitored, Event button test confirmed by [facility].

## 2023-03-21 NOTE — Progress Notes (Signed)
Subjective: No further seizures  Exam: Vitals:   03/21/23 1121 03/21/23 1524  BP: 134/79 129/77  Pulse: 83 86  Resp: 18 18  Temp: 98.6 F (37 C) 97.6 F (36.4 C)  SpO2: 99% 97%   Gen: In bed, NAD Resp: non-labored breathing, no acute distress Abd: soft, nt  Neuro: MS: Awake, alert, interactive and appropriate CN: EOMI, visual fields full Motor: No clear focal weakness Sensory: Intact to Mohl touch  Pertinent Labs: Creatinine 1.1  Impression: 62 year old male with small cell lung cancer on palliative chemo, hypertension, hyperlipidemia, COPD, seizures, SIADH and schizophrenia who presented with seizures.  He is currently undergoing LTM with no further seizures overnight last night.  Given his previous was nonconvulsive, I would favor 48 hours seizure-free prior to discontinuing.  Recommendations: 1) continue Vimpat 150 twice daily 2) continue LTM overnight tonight 3) if no seizures overnight, can discontinue LTM in the morning 4) neurology will follow  Ritta Slot, MD [facility] [phone]  If 7pm- 7am, please page neurology on call as listed in AMION.

## 2023-03-21 NOTE — Progress Notes (Signed)
Physical Therapy Treatment Patient Details Name: Trevor Montgomery MRN: [id number] DOB: 1961-03-06 Today's Date: 03/21/2023   History of Present Illness 62 y.o. male presents to [facility] hospital on 03/13/2023 with concern for possible seizures. 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 PMH includes metastatic lung cancer, HTN, HLD, COPD, seizures.    PT Comments    Pt continues to demonstrate improved cognition and willingness to participate with therapy. He ambulated within the room today due to being limited by the EEG line. He demonstrated improved strength, balance, and activity tolerance through performing transfers at a min guard assist level and ambulating at a min guard assist level except 1x in which he needed minA when he had a posterior sway. Pt's sister present throughout session and educated on the amount of assistance pt is currently requiring. Will continue to follow acutely.   Recommendations for follow up therapy are one component of a multi-disciplinary discharge planning process, led by the attending physician.  Recommendations may be updated based on patient status, additional functional criteria and insurance authorization.  Follow Up Recommendations       Assistance Recommended at Discharge Frequent or constant Supervision/Assistance  Patient can return home with the following Direct supervision/assist for medications management;Direct supervision/assist for financial management;Assist for transportation;Help with stairs or ramp for entrance;A little help with walking and/or transfers;A little help with bathing/dressing/bathroom;Assistance with cooking/housework   Equipment Recommendations  None recommended by PT    Recommendations for Other Services       Precautions / Restrictions Precautions Precautions: Fall Precaution Comments: seizures; EEG Restrictions Weight Bearing Restrictions: No     Mobility  Bed Mobility Overal bed mobility: Needs  Assistance Bed Mobility: Supine to Sit     Supine to sit: Min guard, HOB elevated     General bed mobility comments: Extra time to scoot buttocks to EOB but pt able to sit up R EOB from elevated HOB with use of rails at a min guard assist level.    Transfers Overall transfer level: Needs assistance Equipment used: Rollator (4 wheels) Transfers: Sit to/from Stand Sit to Stand: Min guard           General transfer comment: Extra time to power up to stand but no LOB, min guard for safety. Intermittent cues for hand placement.    Ambulation/Gait Ambulation/Gait assistance: Min assist, Min guard Gait Distance (Feet): 35 Feet (x2 bouts of ~35 ft > ~10 ft) Assistive device: Rollator (4 wheels) Gait Pattern/deviations: Step-through pattern, Decreased stride length, Trunk flexed, Narrow base of support Gait velocity: reduced Gait velocity interpretation: <1.31 ft/sec, indicative of household ambulator   General Gait Details: Pt limited in mobility distance by EEG lines,  but did ambulate back and fort in room within limits of line. Min guard assist for safety the majority of the time, cuing pt to increase stride length. x1 mild posterior sway in which minA was provided to ensure recover of his balance.   Stairs             Wheelchair Mobility    Modified Rankin (Stroke Patients Only)       Balance Overall balance assessment: Needs assistance Sitting-balance support: No upper extremity supported, Feet supported Sitting balance-Leahy Scale: Good     Standing balance support: Bilateral upper extremity supported, Single extremity supported Standing balance-Leahy Scale: Poor Standing balance comment: Pt preferring 1 UE support for balance when reaching off BOS to perform pericare with other UE. Benefits from rollator to ambulate  Cognition Arousal/Alertness: Awake/alert Behavior During Therapy: WFL for tasks  assessed/performed Overall Cognitive Status: Impaired/Different from baseline Area of Impairment: Attention, Following commands, Safety/judgement, Awareness, Problem solving, Memory                   Current Attention Level: Selective Memory: Decreased short-term memory Following Commands: Follows one step commands consistently, Follows one step commands with increased time, Follows multi-step commands inconsistently Safety/Judgement: Decreased awareness of safety, Decreased awareness of deficits Awareness: Intellectual Problem Solving: Slow processing, Difficulty sequencing, Decreased initiation, Requires verbal cues, Requires tactile cues General Comments: Pt with tangential speech in regards to comic books, needing cues to redirect him to progress mobility at times, but this topic did allow pt to get more engaged with the therapists. Did not recall already having some of these conversations with this therapist the previous day.        Exercises      General Comments General comments (skin integrity, edema, etc.): SpO2 >/= 91% on RA throughout, not on O2 upon arrival      Pertinent Vitals/Pain Pain Assessment Pain Assessment: Faces Faces Pain Scale: No hurt Pain Intervention(s): Monitored during session    Home Living                          Prior Function            PT Goals (current goals can now be found in the care plan section) Acute Rehab PT Goals Patient Stated Goal: to read comic books PT Goal Formulation: With patient/family Time For Goal Achievement: 03/28/23 Potential to Achieve Goals: Fair Progress towards PT goals: Progressing toward goals    Frequency    Min 3X/week      PT Plan Current plan remains appropriate    Co-evaluation              AM-PAC PT "6 Clicks" Mobility   Outcome Measure  Help needed turning from your back to your side while in a flat bed without using bedrails?: A Little Help needed moving from lying  on your back to sitting on the side of a flat bed without using bedrails?: A Little Help needed moving to and from a bed to a chair (including a wheelchair)?: A Little Help needed standing up from a chair using your arms (e.g., wheelchair or bedside chair)?: A Little Help needed to walk in hospital room?: A Little Help needed climbing 3-5 steps with a railing? : Total 6 Click Score: 16    End of Session Equipment Utilized During Treatment: Gait belt Activity Tolerance: Patient tolerated treatment well Patient left: with call bell/phone within reach;in chair;with chair alarm set Nurse Communication: Mobility status PT Visit Diagnosis: Other abnormalities of gait and mobility (R26.89);Muscle weakness (generalized) (M62.81);Unsteadiness on feet (R26.81);Difficulty in walking, not elsewhere classified (R26.2)     Time: 4098-1191 PT Time Calculation (min) (ACUTE ONLY): 35 min  Charges:  $Gait Training: 8-22 mins $Therapeutic Activity: 8-22 mins                     Raymond Gurney, PT, DPT Acute Rehabilitation Services  Office: [phone]    Jewel Baize 03/21/2023, 3:47 PM

## 2023-03-22 DIAGNOSIS — R569 Unspecified convulsions: Secondary | ICD-10-CM | POA: Diagnosis not present

## 2023-03-22 LAB — BASIC METABOLIC PANEL
Anion gap: 10 (ref 5–15)
BUN: 7 mg/dL (ref 6–20)
CO2: 27 mmol/L (ref 22–32)
Calcium: 8.9 mg/dL (ref 8.9–10.3)
Chloride: 95 mmol/L — ABNORMAL LOW (ref 98–111)
Creatinine, Ser: 0.56 mg/dL — ABNORMAL LOW (ref 0.61–1.24)
GFR, Estimated: >60 mL/min (ref >60)
Glucose, Bld: 124 mg/dL — ABNORMAL HIGH (ref 70–99)
Potassium: 3.6 mmol/L (ref 3.5–5.1)
Sodium: 132 mmol/L — ABNORMAL LOW (ref 135–145)

## 2023-03-22 NOTE — Progress Notes (Signed)
EEG maint complete.  ?

## 2023-03-22 NOTE — Progress Notes (Signed)
[facility] Progress Note Patient: Trevor Montgomery ZOX:[id number] DOB: [birth year] DOA: 03/14/2023  DOS: the patient was seen and examined on 03/22/2023  Brief hospital course: Patient is a 62 year old gentleman history of stage IVa small cell lung cancer on palliative chemo, HTN, HLD, COPD, seizures, SIADH, schizophrenia EMS was called as patient noted to have a right gaze deviation with jerking upper extremities concerning for seizure-like activity and given Versed on arrival to [facility] ED. On presentation to [facility] ED patient had persistent right gaze deviation received Keppra 4 g and Ativan 4 mg with resultant improvement with right gaze deviation however remains significantly obtunded. 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 Neurology consulted and patient transferred to [facility] for continuous EEG for further evaluation. 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  Was given Decadron which she was not able to tolerate due to ongoing agitation.   Agitation currently controlled after switching to Vimpat. Family's goal is to go home with hospice.  Assessment and Plan: Recurrent seizures. Recent episode in early June. Readmission this time around. Neurology was consulted. Think seizures provoked secondary to hyponatremia. EEG/LTM negative for any active seizures. Was on Keppra increased to 1000 mg twice daily. Now per discussion with neuro-oncology patient will be on Vimpat. Neurology reconsulted as the patient appears to have episodes of staring spell both on 6/26 as well as 6/25. LTM EEG did capture a possible seizure spike on 6/26.  Vimpat dose increased to 150 mg twice daily per discussion with neurology. Per my discussion continue LTM EEG for 24 hours.  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  With vasogenic edema On  further workup of his seizures CT head was done which showed a hyperdense lesion in the right temporal lobe and a hypodense lesion in the right cerebellum. CT angiogram head and neck as well as CT venogram concerning for metastatic lesions with vasogenic edema. 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 Neuro-oncology was earlier consulted. I discussed with neuro-oncology on 6/26.  Patient will be discussed on tumor board.  Patient may require whole brain radiation.  Prognosis not clear as neuro-oncology would like repeat imaging with contrast. But with the fact that the patient already has stage IV small cell lung cancer and now has brain metastasis on maintenance chemotherapy prognosis is poor.  Hyponatremia. SIADH. Possibility of association with psychiatric medications with dry mouth as well. Currently corrected. At risk of further recurrence.  Hypokalemia. Hypomagnesemia. Replaced.  Aspiration pneumonia. Currently on IV Unasyn. Repeat x-ray on 6/26 does not show any evidence of worsening pneumonia but examination reveals bilateral expiratory wheezing and rhonchi and secretions in the upper airway. Cultures negative. CT shows evidence of tracheal debris's. Appreciate SLP follow-up. Prognosis is poor.  Schizophrenia. Patient is a schizophrenia and has been on Prolixin. Was also on Cogentin which is now switched to as needed. Due to his ongoing agitation as well as concern for hallucination psychiatry is reconsulted for further assistance. Appreciate assistance.  Given that the patient has been stable from schizophrenia point of view for so long, therefore his current presentation is more likely attributed to his organic causes per psychiatric.  Acute encephalopathy likely combination of toxic and metabolic as well as presence of hospital induced delirium, schizophrenia and brain metastatic lesions. Paraneoplastic syndrome cannot be ruled out as well. Patient with  ongoing agitation.  Perseverance  about same topic and concern for possible hallucination. Not participating with physical therapy. Not eating. This does not bode well for his diagnosis of cancer with his need for radiation and chemotherapy. There was concern for Decadron induced confusion as well. There is concern for Keppra induced confusion and agitation and therefore we will switch it to Vimpat per discussion with neuro-oncology. Currently has ordered for as needed Haldol. Psychiatry consulted.  For further assistance.  Goals of care conversation. Patient is not in a condition to have any conversation with regards to his goals of care. Discussed with sister who is the next of kin. Patient reportedly has mentioned that he would prefer comfort for his primary goal. He has schizophrenia and unable to tolerate medications for cerebral edema like Decadron with resultant agitation. Has stage IV lung cancer and now appears to have metastatic brain lesions for which she will require further therapy. Currently not participating in therapy in the hospital not participating with nutrition in the hospital. Concern is that patient's condition will continue to deteriorate further. Perative care consulted. Discussed with family and recommended consideration for hospice given his multiple comorbidities. Discussed with sister at bedside, she would like to switch to hospice on discharge.  Will consult TOC.  Intractable nausea and vomiting. Etiology not clear.  Now resolved. X-ray abdomen unremarkable.  Tachycardia and elevated blood pressure. Improved.   Anxiety. On Inderal 5 mg twice daily.  Continue.   Subjective: No nausea no vomiting no fever no chills.  Physical Exam: General: in Mild distress, No Rash Cardiovascular: S1 and S2 Present, No Murmur Respiratory: Good respiratory effort, Bilateral Air entry present. No Crackles, No wheezes Abdomen: Bowel Sound present, No  tenderness Extremities: No edema Neuro: Alert and oriented x3, no new focal deficit  Data Reviewed: I have Reviewed nursing notes, Vitals, and Lab results. Since last encounter, pertinent lab results CBC and BMP   . I have ordered test including CBC and BMP  .   Disposition: Status is: Inpatient Remains inpatient appropriate because: Awaiting arrangement of home hospice.  Likely will be Monday  enoxaparin (LOVENOX) injection 40 mg Start: 03/15/23 2200   Family Communication: Family at bedside Level of care: Telemetry Cardiac   Vitals:   03/22/23 0300 03/22/23 0754 03/22/23 1215 03/22/23 1637  BP: (!) 159/97 (!) 168/89 (!) 155/84 (!) 155/90  Pulse: 85 82 92 86  Resp: 18 18 18 18   Temp: 98.1 F (36.7 C) 98 F (36.7 C) 98.8 F (37.1 C) 97.7 F (36.5 C)  TempSrc: Oral Oral  Oral  SpO2: 92% 93% 94% 94%  Weight:      Height:         Author: Lynden Oxford, MD 03/22/2023 5:23 PM  Please look on [email or web address] to find out who is on call.

## 2023-03-22 NOTE — Procedures (Addendum)
Patient Name: Jarquise Higgs  MRN: [id number]  Epilepsy Attending: Charlsie Quest  Referring Physician/Provider: Caryl Pina, MD  Duration: 03/21/2023 2213 to 03/22/2023 1147   Patient history: 62 yo M with breakthrough seizure getting eeg to evaluate for seizure.   Level of alertness: awake, asleep   AEDs during EEG study: LCM   Technical aspects: This EEG study was done with scalp electrodes positioned according to the 10-20 International system of electrode placement. Electrical activity was reviewed with band pass filter of 1-70Hz , sensitivity of 7 uV/mm, display speed of 79mm/sec with a 60Hz  notched filter applied as appropriate. EEG data were recorded continuously and digitally stored.  Video monitoring was available and reviewed as appropriate.   Description: The posterior dominant rhythm consists of 8 Hz activity of moderate voltage (25-35 uV) seen predominantly in posterior head regions, symmetric and reactive to eye opening and eye closing. Sleep was characterized by vertex waves, sleep spindles (12 to 14 Hz), maximal frontocentral region. Intermittent generalized and maximal right frontotemporal region 3-5 hz theta- delta slowing was seen. Spike were noted in right frontotemporal region, at times in runs lasting 2-4 seconds.    ABNORMALITY - Spike, right fronto-temporal region -Intermittent slow, generalized and maximal right fronto-temporal region   IMPRESSION: This study showed evidence of epileptogenicity and cortical dysfunction arising from right fronto-temporal region. Additionally there is mild diffuse encephalopathy, nonspecific etiology. No definite seizures were noted.    Daziya Redmond Annabelle Harman

## 2023-03-22 NOTE — Progress Notes (Signed)
LTM EEG discontinued - no skin breakdown at unhook.   

## 2023-03-22 NOTE — Progress Notes (Signed)
Subjective: No further seizures  Exam: Vitals:   03/22/23 0300 03/22/23 0754  BP: (!) 159/97 (!) 168/89  Pulse: 85 82  Resp: 18 18  Temp: 98.1 F (36.7 C) 98 F (36.7 C)  SpO2: 92% 93%   Gen: In bed, NAD Resp: non-labored breathing, no acute distress Abd: soft, nt  Neuro: MS: Awake, alert, interactive and appropriate CN: EOMI, visual fields full Motor: No clear focal weakness Sensory: Intact to Tardif touch  Pertinent Labs: Creatinine 1.1  Impression: 62 year old male with small cell lung cancer on palliative chemo, hypertension, hyperlipidemia, COPD, seizures, SIADH and schizophrenia who presented with seizures.  He is currently undergoing LTM with no further seizures for the last two nights.  Recommendations: 1) continue Vimpat 150 twice daily 2) can discontinue LTM  3) neurology will be available as needed  Ritta Slot, MD [facility] [phone]  If 7pm- 7am, please page neurology on call as listed in AMION.

## 2023-03-23 DIAGNOSIS — R569 Unspecified convulsions: Secondary | ICD-10-CM | POA: Diagnosis not present

## 2023-03-23 LAB — GLUCOSE, CAPILLARY: Glucose-Capillary: 109 mg/dL — ABNORMAL HIGH (ref 70–99)

## 2023-03-23 NOTE — TOC Progression Note (Signed)
Transition of Care [facility]) - Progression Note    Patient Details  Name: Trevor Montgomery MRN: [id number] Date of Birth: [birth year]  [facility]) CM/SW Contact  Dawnna Gritz G., RN Phone Number: 03/23/2023, 12:55 PM  Clinical Narrative:     RNCM received DME orders for hospital bed, BSC, and overbed table.  Jasmine at [employer] contacted with DME orders and confirmation received-to be delivered tot he home tomorrow.    Expected Discharge Plan: Home w Hospice Care Barriers to Discharge: Continued Medical Work up Expected Discharge Plan and Services   Discharge Planning Services: CM Consult Post Acute Care Choice: Hospice Living arrangements for the past 2 months: Single Family Home                 DME Arranged: Walker rolling with seat         HH Arranged: PT, Speech Therapy         Social Determinants of Health (SDOH) Interventions SDOH Screenings   Food Insecurity: No Food Insecurity (03/14/2023)  Housing: Low Risk  (03/14/2023)  Transportation Needs: No Transportation Needs (03/14/2023)  Utilities: Not At Risk (03/14/2023)  Alcohol Screen: Low Risk  (11/11/2022)  Depression (PHQ2-9): Low Risk  (11/11/2022)  Financial Resource Strain: Low Risk  (11/11/2022)  Physical Activity: Inactive (11/11/2022)  Social Connections: Unknown (11/11/2022)  Tobacco Use: Medium Risk (03/20/2023)   Readmission Risk Interventions     No data to display

## 2023-03-23 NOTE — Progress Notes (Signed)
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 On presentation to [facility] ED patient had persistent right gaze deviation received Keppra 4 g and Ativan 4 mg with resultant improvement with right gaze deviation however remains significantly obtunded. 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 Neurology consulted and patient transferred to [facility] for continuous EEG for further evaluation. 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  Was given Decadron which she was not able to tolerate due to ongoing agitation.   Agitation currently controlled after switching to Vimpat. Family's goal is to go home with hospice.  Assessment and Plan: Recurrent seizures. Recent episode in early June. Readmission this time around. Neurology was consulted. Think seizures provoked secondary to hyponatremia. EEG/LTM negative for any active seizures. Was on Keppra increased to 1000 mg twice daily. Now per discussion with neuro-oncology patient will be on Vimpat. Neurology reconsulted as the patient appears to have episodes of staring spell both on 6/26 as well as 6/25. LTM EEG did capture a possible seizure spike on 6/26.  Vimpat dose increased to 150 mg twice daily per discussion with neurology. Per my discussion continue LTM EEG for 24 hours.  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  With vasogenic edema On  further workup of his seizures CT head was done which showed a hyperdense lesion in the right temporal lobe and a hypodense lesion in the right cerebellum. CT angiogram head and neck as well as CT venogram concerning for metastatic lesions with vasogenic edema. 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 Neuro-oncology was earlier consulted. I discussed with neuro-oncology on 6/26.  Patient will be discussed on tumor board.  Patient may require whole brain radiation.  Prognosis not clear as neuro-oncology would like repeat imaging with contrast. But with the fact that the patient already has stage IV small cell lung cancer and now has brain metastasis on maintenance chemotherapy prognosis is poor.  Hyponatremia. SIADH. Possibility of association with psychiatric medications with dry mouth as well. Currently corrected. At risk of further recurrence.  Hypokalemia. Hypomagnesemia. Replaced.  Aspiration pneumonia. Currently on IV Unasyn. Repeat x-ray on 6/26 does not show any evidence of worsening pneumonia but examination reveals bilateral expiratory wheezing and rhonchi and secretions in the upper airway. Cultures negative. CT shows evidence of tracheal debris's. Appreciate SLP follow-up. Prognosis is poor.  Schizophrenia. Patient is a schizophrenia and has been on Prolixin. Was also on Cogentin which is now switched to as needed. Due to his ongoing agitation as well as concern for hallucination psychiatry is reconsulted for further assistance. Appreciate assistance.  Given that the patient has been stable from schizophrenia point of view for so long, therefore his current presentation is more likely attributed to his organic causes per psychiatric.  Acute encephalopathy likely combination of toxic and metabolic as well as presence of hospital induced delirium, schizophrenia and brain metastatic lesions. Paraneoplastic syndrome cannot be ruled out as well. Patient with  ongoing agitation.  Perseverance  about same topic and concern for possible hallucination. Not participating with physical therapy. Not eating. This does not bode well for his diagnosis of cancer with his need for radiation and chemotherapy. There was concern for Decadron induced confusion as well. There is concern for Keppra induced confusion and agitation and therefore we will switch it to Vimpat per discussion with neuro-oncology. Currently has ordered for as needed Haldol. Psychiatry consulted.  For further assistance.  Goals of care conversation. Patient is not in a condition to have any conversation with regards to his goals of care. Discussed with sister who is the next of kin. Patient reportedly has mentioned that he would prefer comfort for his primary goal. He has schizophrenia and unable to tolerate medications for cerebral edema like Decadron with resultant agitation. Has stage IV lung cancer and now appears to have metastatic brain lesions for which she will require further therapy. Currently not participating in therapy in the hospital not participating with nutrition in the hospital. Concern is that patient's condition will continue to deteriorate further. Perative care consulted. Discussed with family and recommended consideration for hospice given his multiple comorbidities. Discussed with sister at bedside, she would like to switch to hospice on discharge.  Will consult TOC.  Intractable nausea and vomiting. Etiology not clear.  Now resolved. X-ray abdomen unremarkable.  Tachycardia and elevated blood pressure. Improved.   Anxiety. On Inderal 5 mg twice daily.  Continue.   Subjective: No acute vents.  No nausea no vomiting.  Physical Exam: S1-S2 present. Bilateral expiratory wheezing present. No focal deficit.  Data Reviewed: I have Reviewed nursing notes, Vitals, and Lab results. Reordered BMP.  Disposition: Status is: Inpatient Remains inpatient  appropriate because: Awaiting arrangement of home hospice.  Likely will be Monday  enoxaparin (LOVENOX) injection 40 mg Start: 03/15/23 2200   Family Communication: Family at bedside Level of care: Med-Surg   Vitals:   03/23/23 0400 03/23/23 0846 03/23/23 1233 03/23/23 1639  BP: (!) (P) 154/82 139/82 (!) 137/92 124/71  Pulse: (P) 82 97 90 88  Resp: (P) 18 18 18    Temp: (P) 98 F (36.7 C) 98.7 F (37.1 C) 98.1 F (36.7 C) (!) 97.5 F (36.4 C)  TempSrc: (P) Oral Oral Oral Oral  SpO2: (P) 98% 96% 96% 95%  Weight:      Height:         Author: Lynden Oxford, MD 03/23/2023 5:44 PM  Please look on [email or web address] to find out who is on call.

## 2023-03-23 NOTE — Progress Notes (Signed)
Speech Language Pathology Treatment: Dysphagia  Patient Details Name: Trevor Montgomery MRN: [id number] DOB: 05-25-1961 Today's Date: 03/23/2023 Time: 1210-1227 SLP Time Calculation (min) (ACUTE ONLY): 17 min  Assessment / Plan / Recommendation Clinical Impression  Patient seen for diet tolerance and readiness for MBS. Per diet orders, patient now on dysphagia 3 solids and thin liquid. Meal tray in room and patient initiated eating lunch with niece present prior to this SLPs arrival to room. Self feeding at an appropriate rate of intake. Overall he presents with a functional oral phase with soft solids and no definite indication of aspiration across pos. He does continue to present with notable upper airway congestion and an intermittent congested cough during po intake (unclear if also at baseline) and combined with fluctuating swallowing ablitites with s/s of aspiration noted this admission as well as previous CXR results suggestive of aspiration (although could have been during seizure), continue to recommend instrumental testing. Radiology unable to complete today but will plan for MBS 7/1. Discussed with MD who is in agreement.    HPI HPI: Pt is a 62 y.o. male who presented secondary to becoming poorly responsive. EMS noted pt to have right gaze deviation and jerking of upper extremities concerning for seizures; pt loaded with Keppra and Ativan. CT head 6/20: Hyperdense lesion in the posterior aspect of the right temporal lobe suspicious for underlying cavernoma. MRI pending at time of evaluation. CT chest 6/20: Likely debris within the trachea and both mainstem bronchi suggesting aspiration. This was also noted on 6/2. PMH: metastatic lung cancer on palliative chemoradiotherapy, HTN, HLD, COPD, provoked seizures in the setting of electrolyte imbalance and on Keppra 500 BID who was at his baseline. BSE and treatment 6/2 and 6/3 and a regular texture diet with thin liquids was ultimately recommended       SLP Plan  MBS      Recommendations for follow up therapy are one component of a multi-disciplinary discharge planning process, led by the attending physician.  Recommendations may be updated based on patient status, additional functional criteria and insurance authorization.    Recommendations  Diet recommendations: Dysphagia 3 (mechanical soft);Thin liquid Liquids provided via: Straw;Cup Medication Administration: Whole meds with puree Supervision: Patient able to self feed;Full supervision/cueing for compensatory strategies Compensations: Slow rate;Small sips/bites Postural Changes and/or Swallow Maneuvers: Seated upright 90 degrees                  Oral care BID   Frequent or constant Supervision/Assistance Dysphagia, unspecified (R13.10);Cognitive communication deficit (R41.841)     MBS    Royalty Fakhouri MA, CCC-SLP  Jerad Dunlap Meryl  03/23/2023, 12:57 PM

## 2023-03-23 NOTE — TOC Progression Note (Signed)
Transition of Care [facility]) - Progression Note    Patient Details  Name: Trevor Montgomery MRN: [id number] Date of Birth: [birth year]  [facility] CM/SW Contact  Alan Drummer G., RN Phone Number: 03/23/2023, 11:49 AM  Clinical Narrative:     RNCM received message from Dr. Allena Katz that family would like Home Hospice via [facility]. Doreatha Martin messaged by Dr. Allena Katz as well.  Revonda Standard  is contacting family now.  Expected Discharge Plan: Home w Home Health Services Barriers to Discharge: Continued Medical Work up  Expected Discharge Plan and Services   Discharge Planning Services: CM Consult Post Acute Care Choice: Home Health Living arrangements for the past 2 months: Single Family Home                 DME Arranged: Walker rolling with seat         HH Arranged: PT, Speech Therapy         Social Determinants of Health (SDOH) Interventions SDOH Screenings   Food Insecurity: No Food Insecurity (03/14/2023)  Housing: Low Risk  (03/14/2023)  Transportation Needs: No Transportation Needs (03/14/2023)  Utilities: Not At Risk (03/14/2023)  Alcohol Screen: Low Risk  (11/11/2022)  Depression (PHQ2-9): Low Risk  (11/11/2022)  Financial Resource Strain: Low Risk  (11/11/2022)  Physical Activity: Inactive (11/11/2022)  Social Connections: Unknown (11/11/2022)  Tobacco Use: Medium Risk (03/20/2023)    Readmission Risk Interventions     No data to display

## 2023-03-23 NOTE — Progress Notes (Signed)
Patient requires BSC due to weakness.  Patient also requires hospital bed due to frequent positioning.

## 2023-03-23 NOTE — Progress Notes (Signed)
[facility] 1O10 [facility] Liaison Note  Received request from Dr. Sharmaine Base Kathi Der for hospice services at home after discharge.  Spoke with patient's sister Garen Lah to initiate education related to hospice philosophy, services and team approach to care.  Family verbalized understanding of information given. Per discussion, the plan is tentative for discharge tomorrow after equipment delivered.  Patient to be discharged by PTAR/EMS per sister as she will be unable to transport herself.  DME needs discussed. Patient has oxygen ([employer]), cane and wheelchair in home.  Sister is requesting hosptial bed, overbed table and bedside commode. ACC to coordinate equipment delivery for tomorrow.  The address has been confirmed as correct in the chart.  Patient's sister Garen Lah is contact for equipment delivery.  Please send signed and completed DNR home with patient at discharge.  Please provide prescriptions at discharge as needed to ensure ongoing symptom management.  ACC contact information given to [employer]. TOC and MD aware.  Thank you for the opportunity to participate in this patient's care.  Please call with any questions or concerns.  Doreatha Martin, RN, BSN [facility] Liaison [phone]

## 2023-03-24 ENCOUNTER — Inpatient Hospital Stay (HOSPITAL_COMMUNITY): Payer: Medicare Other

## 2023-03-24 ENCOUNTER — Inpatient Hospital Stay: Payer: Medicare Other | Attending: Oncology

## 2023-03-24 MED ORDER — PANTOPRAZOLE SODIUM 40 MG PO TBEC: 40.0000 mg | DELAYED_RELEASE_TABLET | Freq: Two times a day (BID) | ORAL | 0 refills | Status: DC

## 2023-03-24 MED ORDER — MAGNESIUM OXIDE -MG SUPPLEMENT 400 (240 MG) MG PO TABS: 800.0000 mg | ORAL_TABLET | Freq: Every day | ORAL | 0 refills | Status: DC

## 2023-03-24 MED ORDER — ALBUTEROL SULFATE HFA 108 (90 BASE) MCG/ACT IN AERS: 2.0000 | INHALATION_SPRAY | Freq: Four times a day (QID) | RESPIRATORY_TRACT | 0 refills | Status: DC | PRN

## 2023-03-24 MED ORDER — ATROPINE SULFATE 1 % OP SOLN: 2.0000 [drp] | Freq: Four times a day (QID) | OPHTHALMIC | 0 refills | Status: DC | PRN

## 2023-03-24 MED ORDER — GUAIFENESIN ER 600 MG PO TB12: 600.0000 mg | ORAL_TABLET | Freq: Two times a day (BID) | ORAL | 0 refills | Status: DC | PRN

## 2023-03-24 MED ORDER — BISACODYL 10 MG RE SUPP: 10.0000 mg | RECTAL | 0 refills | Status: DC | PRN

## 2023-03-24 MED ORDER — BENZONATATE 100 MG PO CAPS: 100.0000 mg | ORAL_CAPSULE | Freq: Four times a day (QID) | ORAL | 0 refills | Status: DC | PRN

## 2023-03-24 MED ORDER — HALOPERIDOL 2 MG PO TABS: 2.0000 mg | ORAL_TABLET | Freq: Three times a day (TID) | ORAL | 0 refills | Status: DC | PRN

## 2023-03-24 MED ORDER — LACOSAMIDE 50 MG PO TABS: 150.0000 mg | ORAL_TABLET | Freq: Two times a day (BID) | ORAL | Status: DC

## 2023-03-24 MED ORDER — LACTULOSE 10 GM/15ML PO SOLN: 10.0000 g | Freq: Every day | ORAL | 0 refills | Status: DC

## 2023-03-24 MED ORDER — LACOSAMIDE 150 MG PO TABS: 150.0000 mg | ORAL_TABLET | Freq: Two times a day (BID) | ORAL | 0 refills | Status: DC

## 2023-03-24 MED ORDER — HYDROCODONE-ACETAMINOPHEN 5-325 MG PO TABS: 1.0000 | ORAL_TABLET | Freq: Four times a day (QID) | ORAL | 0 refills | Status: DC | PRN

## 2023-03-24 MED ORDER — CLONAZEPAM 2 MG PO TBDP: 2.0000 mg | ORAL_TABLET | Freq: Once | ORAL | 0 refills | Status: DC | PRN

## 2023-03-24 MED ORDER — POLYETHYLENE GLYCOL 3350 17 G PO PACK: 17.0000 g | PACK | Freq: Every day | ORAL | 0 refills | Status: DC | PRN

## 2023-03-24 MED ORDER — LORAZEPAM 0.5 MG PO TABS: 0.5000 mg | ORAL_TABLET | Freq: Three times a day (TID) | ORAL | 0 refills | Status: DC | PRN

## 2023-03-24 NOTE — Progress Notes (Signed)
OT Cancellation Note  Patient Details Name: Trevor Montgomery MRN: [id number] DOB: May 14, 1961   Cancelled Treatment:    Reason Eval/Treat Not Completed: Patient at procedure or test/ unavailable- pt off unit at this time. OT will check back as appropriate today  Mateo Flow 03/24/2023, 9:50 AM

## 2023-03-24 NOTE — Progress Notes (Signed)
Physical Therapy Treatment Patient Details Name: Trevor Montgomery MRN: [id number] DOB: 1961/05/30 Today's Date: 03/24/2023   History of Present Illness 62 y.o. male presents to [facility] hospital on 03/13/2023 with concern for possible seizures. 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 PMH includes metastatic lung cancer, HTN, HLD, COPD, seizures.    PT Comments  Tolerated treatment well. Eager to walk with new rollator in his room. Educated on appropriate use including brake application, bracing against wall to sit, and to keep bil hands on rollator for stability while mobilizing. No buckling, tolerated >100 feet at supervision level, gait speed slightly reduced. No physical assist required during session, supervision for safety. Very pleasant. Asking if he will go home today. Will continue to follow and progress until d/c.     Assistance Recommended at Discharge Intermittent Supervision/Assistance  If plan is discharge home, recommend the following:  Can travel by private vehicle    Direct supervision/assist for medications management;Direct supervision/assist for financial management;Assist for transportation;Help with stairs or ramp for entrance;A little help with walking and/or transfers;A little help with bathing/dressing/bathroom;Assistance with cooking/housework      Equipment Recommendations  None recommended by PT    Recommendations for Other Services       Precautions / Restrictions Precautions Precautions: Fall Precaution Comments: seizures; EEG Restrictions Weight Bearing Restrictions: No     Mobility  Bed Mobility               General bed mobility comments: in recliner    Transfers Overall transfer level: Needs assistance Equipment used: Rollator (4 wheels) Transfers: Sit to/from Stand Sit to Stand: Supervision           General transfer comment: Educated on AD use with rollator, able to lock and unlock without physical assistance.  Sit<>stand safely with rollator for support.    Ambulation/Gait Ambulation/Gait assistance: Supervision Gait Distance (Feet): 115 Feet Assistive device: Rollator (4 wheels) Gait Pattern/deviations: Step-through pattern, Decreased stride length, Trunk flexed, Narrow base of support Gait velocity: reduced Gait velocity interpretation: 1.31 - 2.62 ft/sec, indicative of limited community ambulator   General Gait Details: Mobilized well at slightly reduced gait speed holding Rollator for support. Released rollator to scratch his belly at one point showing some instability but able to self correct. Educated on awareness, and appropriate use of AD while ambulating. Taught to place Rollator against wall and lock prior to sitting/standing from rollator. performed without physical assist.   Stairs             Wheelchair Mobility     Tilt Bed    Modified Rankin (Stroke Patients Only)       Balance Overall balance assessment: Needs assistance Sitting-balance support: No upper extremity supported, Feet supported Sitting balance-Leahy Scale: Good     Standing balance support: No upper extremity supported Standing balance-Leahy Scale: Fair                              Cognition Arousal/Alertness: Awake/alert Behavior During Therapy: WFL for tasks assessed/performed Overall Cognitive Status: History of cognitive impairments - at baseline                                 General Comments: No family present to confirm, but likely back to baseline. Appropriate throughout session.        Exercises      General  Comments        Pertinent Vitals/Pain Pain Assessment Pain Assessment: No/denies pain Pain Intervention(s): Monitored during session    Home Living                          Prior Function            PT Goals (current goals can now be found in the care plan section) Acute Rehab PT Goals Patient Stated Goal: to read comic  books PT Goal Formulation: With patient/family Time For Goal Achievement: 03/28/23 Potential to Achieve Goals: Fair Progress towards PT goals: Progressing toward goals    Frequency    Min 3X/week      PT Plan Current plan remains appropriate    Co-evaluation              AM-PAC PT "6 Clicks" Mobility   Outcome Measure  Help needed turning from your back to your side while in a flat bed without using bedrails?: A Little Help needed moving from lying on your back to sitting on the side of a flat bed without using bedrails?: A Little Help needed moving to and from a bed to a chair (including a wheelchair)?: A Little Help needed standing up from a chair using your arms (e.g., wheelchair or bedside chair)?: A Little Help needed to walk in hospital room?: A Little Help needed climbing 3-5 steps with a railing? : A Little 6 Click Score: 18    End of Session Equipment Utilized During Treatment: Gait belt Activity Tolerance: Patient tolerated treatment well Patient left: with call bell/phone within reach;in chair;with chair alarm set   PT Visit Diagnosis: Other abnormalities of gait and mobility (R26.89);Muscle weakness (generalized) (M62.81);Unsteadiness on feet (R26.81);Difficulty in walking, not elsewhere classified (R26.2)     Time: 8119-1478 PT Time Calculation (min) (ACUTE ONLY): 11 min  Charges:    $Gait Training: 8-22 mins PT General Charges $$ ACUTE PT VISIT: 1 Visit                     Kathlyn [place], PT, DPT [facility]  Rehabilitation Services Physical Therapist Office: [phone] Website: [email or web address]    Berton Mount 03/24/2023, 4:21 PM

## 2023-03-24 NOTE — TOC Transition Note (Signed)
Transition of Care [facility]) - CM/SW Discharge Note   Patient Details  Name: Trevor Montgomery MRN: [id number] Date of Birth: Nov 06, 1960  Transition of Care [facility]) CM/SW Contact:  Kermit Balo, RN Phone Number: 03/24/2023, 3:10 PM   Clinical Narrative:    Patient is discharging home with hospice services through [facility]. Revonda Standard with [facility] is aware of dc today. DME has been delivered to the home per [employer]. Pt to transport via [facility]. Bedside RN updated.   Final next level of care: Home w Hospice Care Barriers to Discharge: No Barriers Identified   Patient Goals and CMS Choice CMS Medicare.gov Compare Post Acute Care list provided to:: Patient Represenative (must comment) Choice offered to / list presented to : [facility] POA / Guardian  Discharge Placement                         Discharge Plan and Services Additional resources added to the After Visit Summary for     Discharge Planning Services: CM Consult Post Acute Care Choice: Hospice          DME Arranged: 3-N-1, Walker rolling with seat, Hospital bed (overbed table) DME Agency: [employer] Date DME Agency Contacted: 03/23/23 Time DME Agency Contacted: 1300 Representative spoke with at DME Agency: Leavy Cella HH Arranged: PT, Speech Therapy HH Agency: [facility]  Date [facility] Agency Contacted: 03/24/23   Representative spoke with at [facility] Agency: Revonda Standard  Social Determinants of Health (SDOH) Interventions SDOH Screenings   Food Insecurity: No Food Insecurity (03/14/2023)  Housing: Low Risk  (03/14/2023)  Transportation Needs: No Transportation Needs (03/14/2023)  Utilities: Not At Risk (03/14/2023)  Alcohol Screen: Low Risk  (11/11/2022)  Depression (PHQ2-9): Low Risk  (11/11/2022)  Financial Resource Strain: Low Risk  (11/11/2022)  Physical Activity: Inactive (11/11/2022)  Social Connections: Unknown (11/11/2022)  Tobacco Use: Medium Risk (03/20/2023)     Readmission Risk Interventions      No data to display

## 2023-03-24 NOTE — Progress Notes (Signed)
[facility] CSW Progress Note  Clinical Child psychotherapist contacted caregiver by phone per the request of Nurse Navigator to provide support.  Patient's sister, [name], spoke with the RN when she found out that his cancer had worsened.  [name] also cares for her 60 y/o son who is disabled.  She has a good support system in place, including friends and a sister.  She is aware that there are counseling services available at [facility] and in the community.  CSW provided active listening and supportive counseling.    Dorothey Baseman, LCSW Clinical Social Worker [facility]

## 2023-03-24 NOTE — Progress Notes (Signed)
    Durable Medical Equipment  (From admission, onward)           Start     Ordered   03/23/23 1504  For home use only DME Hospital bed  Once       Question Answer Comment  Length of Need Lifetime   The above medical condition requires: Patient requires the ability to reposition frequently   Bed type Semi-electric      03/23/23 1503   03/23/23 1504  For home use only DME Bedside commode  Once       Question:  Patient needs a bedside commode to treat with the following condition  Answer:  Physical deconditioning   03/23/23 1503   03/23/23 1504  For home use only DME Overbed table  Once        03/23/23 1503   03/14/23 1535  For home use only DME 4 wheeled rolling walker with seat  Once       Question:  Patient needs a walker to treat with the following condition  Answer:  Weakness   03/14/23 1534             The patient requires a bedside commode as he is not able to make it in a timely manner to the restroom on the level of the home in which he will be staying.   Patient has medical condition which requires positioning of body in ways not feasible with an ordinary bed.  Patient requires positioning of body in a way not feasible with an ordinary bed in order to alleviate pain. Patient requires frequent changes in body position and has an immediate need for a change in body position.

## 2023-03-24 NOTE — Progress Notes (Signed)
Modified Barium Swallow Study  Patient Details  Name: Trevor Montgomery MRN: [id number] Date of Birth: 03/31/1961  Today's Date: 03/24/2023  Modified Barium Swallow completed.  Full report located under Chart Review in the Imaging Section.  History of Present Illness Pt is a 62 y.o. male who presented secondary to becoming poorly responsive. EMS noted pt to have right gaze deviation and jerking of upper extremities concerning for seizures; pt loaded with Keppra and Ativan. CT head 6/20: Hyperdense lesion in the posterior aspect of the right temporal lobe suspicious for underlying cavernoma. MRI pending at time of evaluation. CT chest 6/20: Likely debris within the trachea and both mainstem bronchi suggesting aspiration. This was also noted on 6/2. PMH: metastatic lung cancer on palliative chemoradiotherapy, HTN, HLD, COPD, provoked seizures in the setting of electrolyte imbalance and on Keppra 500 BID who was at his baseline. BSE and treatment 6/2 and 6/3 and a regular texture diet with thin liquids was ultimately recommended   Clinical Impression Pt demonstrates some anatomical differences in swallowing given forward head position and horizonal pharynx. However, pt has good strength, pharyngeal function is intact. There were instances of trace flash and frank penetration that was eventually ejected with no aspiration. Trace coating/pooling in pharyngeal sulci, perhaps contributing to slightly wet resonance at times. Congested respiration again noted, but not related to dysphagia. Recommend pt continue soft diet with thin liquids. No further SLP interventions needed Factors that may increase risk of adverse event in presence of aspiration Rubye Oaks & Clearance Coots 2021): Inadequate oral hygiene;Poor general health and/or compromised immunity  Swallow Evaluation Recommendations Liquid Administration via: Cup;Straw Medication Administration: Whole meds with liquid Supervision: Patient able to self-feed Swallowing  strategies  : Slow rate;Small bites/sips Postural changes: Position pt fully upright for meals Oral care recommendations: Oral care BID (2x/day)      Amariya Liskey, Riley Nearing 03/24/2023,11:35 AM

## 2023-03-25 NOTE — Care Management Important Message (Signed)
Important Message  Patient Details  Name: Trevor Montgomery MRN: [id number] Date of Birth: August 27, 1961   Medicare Important Message Given:  Yes Patient left prior to the 2nd delivery of the IM copy will mail mailed to the patient home address     Dorena Bodo 03/25/2023, 8:20 AM

## 2023-03-25 NOTE — Discharge Summary (Signed)
Physician Discharge Summary   Patient: Trevor Montgomery MRN: [id number] DOB: 07/18/1961  Admit date:     03/14/2023  Discharge date: 03/24/2023  Discharge Physician: Lynden Oxford  PCP: Barbette Reichmann, MD  Recommendations at discharge: Follow-up with PCP as needed. Establish care with hospice.   Follow-up Information     [facility] Follow up.   Specialty: Hospice and Palliative Medicine Why: The hospice agency will contact you for the first home visit. Contact information: [address] 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 On presentation to [facility] ED patient had persistent right gaze deviation received Keppra 4 g and Ativan 4 mg with resultant improvement with right gaze deviation however remains significantly obtunded. 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 Neurology consulted and patient transferred to [facility] for continuous EEG for further evaluation. 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  Was given Decadron which she was not able to tolerate due to ongoing  agitation.   Agitation currently controlled after switching to Vimpat. Family's goal is to go home with hospice.  Assessment and Plan: Recurrent seizures. Recent episode in early June. Readmission this time around. Neurology was consulted. Think seizures provoked secondary to hyponatremia. EEG/LTM negative for any active seizures. Was on Keppra increased to 1000 mg twice daily. Now per discussion with neuro-oncology patient will be on Vimpat. Neurology reconsulted as the patient appears to have episodes of staring spell both on 6/26 as well as 6/25. LTM EEG did capture a possible seizure spike on 6/26.  Vimpat dose increased to 150 mg twice daily per discussion with neurology.  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  With vasogenic edema On further workup of his seizures CT head was done which showed a hyperdense lesion in the right temporal lobe and a hypodense lesion in the right cerebellum. CT angiogram head and neck as well as CT venogram concerning for metastatic lesions with vasogenic edema. 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 Neuro-oncology was earlier consulted. I discussed with neuro-oncology on 6/26.  Patient will be discussed on tumor board.  Patient may require whole brain radiation.  Prognosis not clear as neuro-oncology would like repeat imaging with contrast. But with the fact that the patient already has stage IV small cell lung cancer and now has brain metastasis on maintenance chemotherapy prognosis is poor. Based on this family has decided to transition to hospice on discharge.  Hyponatremia. SIADH. Possibility of association with psychiatric medications with dry mouth as well. Currently corrected. At risk of further recurrence.  Hypokalemia. Hypomagnesemia. Replaced.  Aspiration pneumonia. Currently on IV Unasyn. Repeat x-ray  on 6/26 does not show any evidence of worsening pneumonia but examination reveals bilateral expiratory  wheezing and rhonchi and secretions in the upper airway. Cultures negative. CT shows evidence of tracheal debris's. Appreciate SLP follow-up. Prognosis is poor.  Schizophrenia. Patient is a schizophrenia and has been on Prolixin. Was also on Cogentin which is now switched to as needed. Due to his ongoing agitation as well as concern for hallucination psychiatry is reconsulted for further assistance. Appreciate assistance.  Given that the patient has been stable from schizophrenia point of view for so long, therefore his current presentation is more likely attributed to his organic causes per psychiatric.  Acute encephalopathy likely combination of toxic and metabolic as well as presence of hospital induced delirium, schizophrenia and brain metastatic lesions. Paraneoplastic syndrome cannot be ruled out as well. Patient with ongoing agitation.  Perseverance about same topic and concern for possible hallucination. Not participating with physical therapy. Not eating. This does not bode well for his diagnosis of cancer with his need for radiation and chemotherapy. There was concern for Decadron induced confusion as well. There is concern for Keppra induced confusion and agitation and therefore we will switch it to Vimpat per discussion with neuro-oncology. Psychiatry consulted appreciated.  Goals of care conversation. Patient is not in a condition to have any conversation with regards to his goals of care. Discussed with sister who is the next of kin. Patient reportedly has mentioned that he would prefer comfort for his primary goal. He has schizophrenia and unable to tolerate medications for cerebral edema like Decadron with resultant agitation. Has stage IV lung cancer and now appears to have metastatic brain lesions for which she will require further therapy. Currently not participating in therapy in the hospital not participating with nutrition in the hospital. Concern is that patient's  condition will continue to deteriorate further. Perative care consulted. Discussed with family and recommended consideration for hospice given his multiple comorbidities. Discussed with sister at bedside, she would like to switch to hospice on discharge.  Will consult TOC.  Intractable nausea and vomiting. Etiology not clear.  Now resolved. X-ray abdomen unremarkable.  Tachycardia and elevated blood pressure. Improved.   Anxiety. On Inderal 5 mg twice daily.  Continue.  Obesity Body mass index is 31.51 kg/m.  Placing the pt at higher risk of poor outcomes.  Consultants:  Neurology Psychiatry Palliative care Phone consultation with neuro-oncology.  Procedures performed:  LTM EEG  DISCHARGE MEDICATION: Allergies as of 03/24/2023       Reactions   Fluphenazine    Other Reaction(s): Unknown        Medication List     STOP taking these medications    bisacodyl 5 MG EC tablet Commonly known as: DULCOLAX Replaced by: bisacodyl 10 MG suppository   levETIRAcetam 500 MG tablet Commonly known as: Keppra   lisinopril 10 MG tablet Commonly known as: ZESTRIL   lisinopril-hydrochlorothiazide 20-12.5 MG tablet Commonly known as: ZESTORETIC   meloxicam 15 MG tablet Commonly known as: MOBIC   potassium chloride SA 20 MEQ tablet Commonly known as: KLOR-CON M   simvastatin 20 MG tablet Commonly known as: ZOCOR       TAKE these medications    albuterol 108 (90 Base) MCG/ACT inhaler Commonly known as: VENTOLIN HFA Inhale 2 puffs into the lungs every 6 (six) hours as needed for wheezing or shortness of breath.   atropine 1 % ophthalmic solution Place 2 drops under the tongue 4 (four) times daily as needed (EXCESSICE SECRETION).  benzonatate 100 MG capsule Commonly known as: TESSALON Take 1 capsule (100 mg total) by mouth 4 (four) times daily as needed for cough.   benztropine 0.5 MG tablet Commonly known as: COGENTIN Take 0.5 mg by mouth 2 (two) times  daily.   bisacodyl 10 MG suppository Commonly known as: Dulcolax Place 1 suppository (10 mg total) rectally as needed for moderate constipation. Replaces: bisacodyl 5 MG EC tablet   clonazepam 2 MG disintegrating tablet Commonly known as: KLONOPIN Take 1 tablet (2 mg total) by mouth once as needed for up to 1 dose (for seizure that last more than 2 minutes.).   docusate sodium 100 MG capsule Commonly known as: COLACE Take 1 capsule (100 mg total) by mouth 2 (two) times daily as needed for mild constipation.   fluPHENAZine 5 MG tablet Commonly known as: PROLIXIN Take 5 mg by mouth daily.   guaiFENesin 600 MG 12 hr tablet Commonly known as: MUCINEX Take 1 tablet (600 mg total) by mouth 2 (two) times daily as needed for cough.   haloperidol 2 MG tablet Commonly known as: HALDOL Take 1 tablet (2 mg total) by mouth every 8 (eight) hours as needed for agitation.   HYDROcodone-acetaminophen 5-325 MG tablet Commonly known as: NORCO/VICODIN Take 1-2 tablets by mouth every 6 (six) hours as needed for moderate pain or severe pain. What changed: reasons to take this   ipratropium-albuterol 0.5-2.5 (3) MG/3ML Soln Commonly known as: DUONEB Take 3 mLs by nebulization every 4 (four) hours as needed.   Lacosamide 150 MG Tabs Take 1 tablet (150 mg total) by mouth 2 (two) times daily.   lactulose 10 GM/15ML solution Commonly known as: CHRONULAC Take 15 mLs (10 g total) by mouth daily.   lidocaine-prilocaine cream Commonly known as: EMLA Apply to affected area once   LORazepam 0.5 MG tablet Commonly known as: Ativan Take 1 tablet (0.5 mg total) by mouth every 8 (eight) hours as needed for anxiety.   magnesium oxide 400 (240 Mg) MG tablet Commonly known as: MAG-OX Take 2 tablets (800 mg total) by mouth daily.   menthol-cetylpyridinium 3 MG lozenge Commonly known as: CEPACOL Take 1 lozenge (3 mg total) by mouth as needed for sore throat.   mouth rinse Liqd solution 15 mLs by  Mouth Rinse route as needed (oral care).   Multi-Vitamin tablet Take 1 tablet by mouth daily.   pantoprazole 40 MG tablet Commonly known as: PROTONIX Take 1 tablet (40 mg total) by mouth 2 (two) times daily before a meal.   polyethylene glycol 17 g packet Commonly known as: MIRALAX / GLYCOLAX Take 17 g by mouth daily as needed for mild constipation.   propranolol 10 MG tablet Commonly known as: INDERAL Take 0.5 tablets (5 mg total) by mouth 2 (two) times daily.   sodium chloride 1 g tablet Take 1 tablet (1 g total) by mouth 3 (three) times daily.   Vitamin E 400 units Tabs Take 400 Units by mouth daily.       Disposition: Home with hospice Diet recommendation: Cardiac diet  Discharge Exam: Vitals:   03/24/23 0022 03/24/23 0453 03/24/23 0731 03/24/23 1142  BP: 134/70 (!) 140/83 (!) 144/85 139/85  Pulse: 84 76 84 85  Resp: 18  18 18   Temp: 97.7 F (36.5 C) 97.7 F (36.5 C) 98.1 F (36.7 C) 97.7 F (36.5 C)  TempSrc: Oral Oral Oral Oral  SpO2: 93% 90% 94% 96%  Weight:      Height:  General: Appear in mild distress; no visible Abnormal Neck Mass Or lumps, Conjunctiva normal Cardiovascular: S1 and S2 Present, no Murmur, Respiratory: good respiratory effort, Bilateral Air entry present and Occasional  Crackles, bilateral  wheezes Abdomen: Bowel Sound present, Non tender  Extremities: no Pedal edema Neurology: alert and oriented to time, place, and person  [facility] Weights   03/20/23 1904  Weight: 96.8 kg   Condition at discharge: stable  The results of significant diagnostics from this hospitalization (including imaging, microbiology, ancillary and laboratory) are listed below for reference.   Imaging Studies: DG Swallowing Func-Speech Pathology  Result Date: 03/24/2023 Table formatting from the original result was not included. Modified Barium Swallow Study Patient Details Name: Colton Palmateer MRN: [id number] Date of Birth: 06-10-1961 Today's Date: 03/24/2023 HPI/PMH:  HPI: Pt is a 62 y.o. male who presented secondary to becoming poorly responsive. EMS noted pt to have right gaze deviation and jerking of upper extremities concerning for seizures; pt loaded with Keppra and Ativan. CT head 6/20: Hyperdense lesion in the posterior aspect of the right temporal lobe suspicious for underlying cavernoma. MRI pending at time of evaluation. CT chest 6/20: Likely debris within the trachea and both mainstem bronchi suggesting aspiration. This was also noted on 6/2. PMH: metastatic lung cancer on palliative chemoradiotherapy, HTN, HLD, COPD, provoked seizures in the setting of electrolyte imbalance and on Keppra 500 BID who was at his baseline. BSE and treatment 6/2 and 6/3 and a regular texture diet with thin liquids was ultimately recommended Clinical Impression: Clinical Impression: Pt demonstrates some anatomical differences in swallowing given forward head position and horizonal pharynx. However, pt has good strength, pharyngeal function is intact. There were instances of trace flash and frank penetration that was eventually ejected with no aspiration. Trace coating/pooling in pharyngeal sulci, perhaps contributing to slightly wet resonance at times. Congested respiration again noted, but not related to dysphagia. Recommend pt continue soft diet with thin liquids. No further SLP interventions needed Factors that may increase risk of adverse event in presence of aspiration Rubye Oaks & Clearance Coots 2021): Factors that may increase risk of adverse event in presence of aspiration Rubye Oaks & Clearance Coots 2021): Inadequate oral hygiene; Poor general health and/or compromised immunity Recommendations/Plan: Swallowing Evaluation Recommendations Swallowing Evaluation Recommendations Liquid Administration via: Cup; Straw Medication Administration: Whole meds with liquid Supervision: Patient able to self-feed Swallowing strategies  : Slow rate; Small bites/sips Postural changes: Position pt fully upright for  meals Oral care recommendations: Oral care BID (2x/day) Treatment Plan Treatment Plan Treatment recommendations: No treatment recommended at this time Recommendations Recommendations for follow up therapy are one component of a multi-disciplinary discharge planning process, led by the attending physician.  Recommendations may be updated based on patient status, additional functional criteria and insurance authorization. Assessment: Orofacial Exam: Orofacial Exam Oral Cavity: Oral Hygiene: WFL Oral Cavity - Dentition: Missing dentition; Poor condition Anatomy: Anatomy: Other (Comment) (kyphonis/lordosis, horizontal pharynx) Boluses Administered: Boluses Administered Boluses Administered: Thin liquids (Level 0); Mildly thick liquids (Level 2, nectar thick); Puree; Solid  Oral Impairment Domain: Oral Impairment Domain Lip Closure: No labial escape Tongue control during bolus hold: Cohesive bolus between tongue to palatal seal Bolus preparation/mastication: Timely and efficient chewing and mashing Bolus transport/lingual motion: Brisk tongue motion Oral residue: Complete oral clearance Location of oral residue : N/A Initiation of pharyngeal swallow : Pyriform sinuses  Pharyngeal Impairment Domain: Pharyngeal Impairment Domain Soft palate elevation: No bolus between soft palate (SP)/pharyngeal wall (PW) Laryngeal elevation: Complete superior movement of thyroid cartilage with complete  approximation of arytenoids to epiglottic petiole Anterior hyoid excursion: Complete anterior movement Epiglottic movement: Complete inversion Laryngeal vestibule closure: Incomplete, narrow column air/contrast in laryngeal vestibule Pharyngeal stripping wave : Present - complete Pharyngeal contraction (A/P view only): N/A Pharyngoesophageal segment opening: Complete distension and complete duration, no obstruction of flow Tongue base retraction: No contrast between tongue base and posterior pharyngeal wall (PPW) Pharyngeal residue: Trace  residue within or on pharyngeal structures Location of pharyngeal residue: Valleculae; Pyriform sinuses  Esophageal Impairment Domain: Esophageal Impairment Domain Esophageal clearance upright position: Complete clearance, esophageal coating Pill: Pill Consistency administered: Thin liquids (Level 0) Thin liquids (Level 0): [facility] Penetration/Aspiration Scale Score: Penetration/Aspiration Scale Score 1.  Material does not enter airway: Thin liquids (Level 0); Mildly thick liquids (Level 2, nectar thick); Puree; Solid; Pill 2.  Material enters airway, remains ABOVE vocal cords then ejected out: Thin liquids (Level 0) 3.  Material enters airway, remains ABOVE vocal cords and not ejected out: Thin liquids (Level 0) Compensatory Strategies: Compensatory Strategies Compensatory strategies: No   General Information: Caregiver present: No  Diet Prior to this Study: Dysphagia 3 (mechanical soft); Thin liquids (Level 0)   Temperature : Normal   Respiratory Status: WFL   Supplemental O2: None (Room air)   History of Recent Intubation: No  Behavior/Cognition: Alert; Cooperative; Pleasant mood; Requires cueing Self-Feeding Abilities: Able to self-feed Baseline vocal quality/speech: Other (comment) (wet at times) Volitional Cough: Able to elicit Volitional Swallow: Able to elicit No data recorded Goal Planning: No data recorded No data recorded No data recorded No data recorded No data recorded Pain: Pain Assessment Pain Assessment: No/denies pain End of Session: Start Time:SLP Start Time (ACUTE ONLY): 0900 Stop Time: SLP Stop Time (ACUTE ONLY): 0912 Time Calculation:SLP Time Calculation (min) (ACUTE ONLY): 12 min Charges: SLP Evaluations $ SLP Speech Visit: 1 Visit SLP Evaluations $MBS Swallow: 1 Procedure $Swallowing Treatment: 1 Procedure SLP visit diagnosis: SLP Visit Diagnosis: Dysphagia, unspecified (R13.10) Past Medical History: Past Medical History: Diagnosis Date  Anxiety   Arthritis   Emphysema of lung (HCC)   HTN  (hypertension)   Hyperlipemia   Schizoaffective disorder, bipolar type (HCC)   Substance abuse (HCC)  Past Surgical History: Past Surgical History: Procedure Laterality Date  FINGER SURGERY    IR IMAGING GUIDED PORT INSERTION  11/04/2022  VIDEO BRONCHOSCOPY WITH ENDOBRONCHIAL ULTRASOUND Left 10/11/2022  Procedure: VIDEO BRONCHOSCOPY WITH ENDOBRONCHIAL ULTRASOUND;  Surgeon: Raechel Chute, MD;  Location: [facility] ORS;  Service: Pulmonary;  Laterality: Left; [place], Riley Nearing 03/24/2023, 11:36 AM  Overnight EEG with video  Result Date: 03/20/2023 Charlsie Quest, MD     03/21/2023  9:56 AM Patient Name: Dredon Barrett MRN: [id number] Epilepsy Attending: Charlsie Quest Referring Physician/Provider: Caryl Pina, MD Duration: 03/19/2023 2213 to 03/20/2023 2213  Patient history: 62 yo M with breakthrough seizure getting eeg to evaluate for seizure.  Level of alertness: awake, asleep  AEDs during EEG study: LCM  Technical aspects: This EEG study was done with scalp electrodes positioned according to the 10-20 International system of electrode placement. Electrical activity was reviewed with band pass filter of 1-70Hz , sensitivity of 7 uV/mm, display speed of 18mm/sec with a 60Hz  notched filter applied as appropriate. EEG data were recorded continuously and digitally stored.  Video monitoring was available and reviewed as appropriate.  Description: The posterior dominant rhythm consists of 8 Hz activity of moderate voltage (25-35 uV) seen predominantly in posterior head regions, symmetric and reactive to eye opening and eye closing. Sleep was characterized  by vertex waves, sleep spindles (12 to 14 Hz), maximal frontocentral region. Intermittent generalized and maximal right frontotemporal region 3-5 hz theta- delta slowing was seen. On 03/19/2023 at around 2049, EEG showed 3 to 6 Hz theta-delta slowing in right fronto-temporal region which appeared sharply contoured, rhythmic with waxing and waning morphology.  On video,  patient was talking to wife but sounded confused asking where he is .  Although no definite evolution was seen, this pattern is concerning and was likely ictal.  Hyperventilation and photic stimulation were not performed.    ABNORMALITY -Intermittent slow, generalized and maximal right fronto-temporal region  IMPRESSION: This study showed evidence of cortical dysfunction arising from right fronto-temporal region. Additionally there is mild diffuse encephalopathy, nonspecific etiology. On 03/19/2023 at around 2049, EEG showed waxing and waning rhythmic pattern in right frontotemporal region.  Concomitantly on video patient was confused, asking where he is.  Also no definite evolution was seen, this pattern is concerning and was likely ictal in nature.  Clinical correlation is recommended. Dr. Amada Jupiter was notified.  Charlsie Quest    DG Abd Portable 1V  Result Date: 03/19/2023 CLINICAL DATA:  Intractable nausea and vomiting. History of lung cancer EXAM: PORTABLE ABDOMEN - 1 VIEW COMPARISON:  None Available. FINDINGS: Gas seen in nondilated loops of small and large bowel. No obstruction. Scattered stool. Air and stool in the rectum. No obvious free air on these portable supine radiographs. Films are under penetrated. Degenerative changes of the spine. IMPRESSION: Nonspecific bowel gas pattern. Electronically Signed   By: Karen Kays M.D.   On: 03/19/2023 19:40   DG CHEST PORT 1 VIEW  Result Date: 03/19/2023 CLINICAL DATA:  Shortness of breath. EXAM: PORTABLE CHEST 1 VIEW COMPARISON:  March 13, 2023 FINDINGS: Right injectable port in stable position. Cardiomediastinal silhouette is normal. Mediastinal contours appear intact. There is no evidence of focal airspace consolidation, pleural effusion or pneumothorax. Emphysema and chronic interstitial lung changes. Osseous structures are without acute abnormality. Healed right-sided rib fractures. Soft tissues are grossly normal. IMPRESSION: 1. Emphysema and  chronic interstitial lung changes. 2. No active disease. Electronically Signed   By: Ted Mcalpine M.D.   On: 03/19/2023 12:06   CT ANGIO HEAD NECK W WO CM  Result Date: 03/15/2023 CLINICAL DATA:  Stroke/TIA, determine embolic source; Dural venous sinus thrombosis suspected eval for possible metastatic disease, patient too kyphotic for MRI EXAM: CT ANGIOGRAPHY HEAD AND NECK WITH AND WITHOUT CONTRAST CT VENOGRAM HEAD TECHNIQUE: Multidetector CT imaging of the head and neck was performed using the standard protocol during bolus administration of intravenous contrast. Multiplanar CT image reconstructions and MIPs were obtained to evaluate the vascular anatomy. Carotid stenosis measurements (when applicable) are obtained utilizing NASCET criteria, using the distal internal carotid diameter as the denominator. Venographic phase images of the brain were obtained following the administration of intravenous contrast. Multiplanar reformats and maximum intensity projections were generated. RADIATION DOSE REDUCTION: This exam was performed according to the departmental dose-optimization program which includes automated exposure control, adjustment of the mA and/or kV according to patient size and/or use of iterative reconstruction technique. CONTRAST:  75mL OMNIPAQUE IOHEXOL 350 MG/ML SOLN COMPARISON:  CT Head 10/12/22 FINDINGS: CT HEAD FINDINGS Brain: Redemonstrated hypodense lesion lateral right frontal lobe (series 9, image 16), new compared to 02/23/2023. Unchanged hyperdense lesion in the posterior right temporal lobe with slight interval increase in surrounding vasogenic edema (series 9, image 30, 33). There may be an additional contrast-enhancing lesion in the posterior left temporal  lobe (series 21, image 105) no hydrocephalus. No extra-axial fluid collection. Vascular: No hyperdense vessel or unexpected calcification. See below Skull: Normal. Negative for fracture or focal lesion. Sinuses/Orbits: No middle  ear or mastoid effusion. Paranasal sinuses clear. Orbits are unremarkable. Other: None. Review of the MIP images confirms the above findings CTA NECK FINDINGS Aortic arch: Standard branching. Imaged portion shows no evidence of aneurysm or dissection. No significant stenosis of the major arch vessel origins. Right carotid system: No evidence of dissection, stenosis (50% or greater), or occlusion. Left carotid system: No evidence of dissection, stenosis (50% or greater), or occlusion. Vertebral arteries: Codominant. No evidence of dissection, stenosis (50% or greater), or occlusion. Skeleton: Redemonstrated are multiple sclerotic regions in the cervical and visualized thoracic vertebral bodies, unchanged from prior exam. Other neck: Negative. Upper chest: See recent prior CT chest for intrathoracic findings. Moderate centrilobular emphysema. Right-sided central venous catheter is only partially imaged. Review of the MIP images confirms the above findings CTA HEAD FINDINGS Anterior circulation: Moderate to severe narrowing in a proximal left M2 branch (series 16, image 145). Apparent filling defect in the distal left cervical ICA at the skull base is favored to be artifactual (series 16, image 138). Posterior circulation: No significant stenosis, proximal occlusion, aneurysm, or vascular malformation. Venous sinuses: As permitted by contrast timing, patent. Anatomic variants: None Review of the MIP images confirms the above findings CT VENOGRAM FINDINGS: No evidence of dural venous sinus thrombosis. IMPRESSION: 1. Moderate to severe narrowing in a proximal left M2 branch. 2. No intracranial large vessel occlusion. 3. No hemodynamically significant stenosis in the neck. 4. No evidence of dural venous sinus thrombosis. 5. Redemonstrated hypodense lesion in the lateral right frontal lobe, new compared to 02/23/2023. Redemonstrated hyperdense lesion in the posterior right temporal lobe with slight interval increase in  surrounding vasogenic edema. There may be an additional contrast-enhancing lesion in the posterior left temporal lobe. Recommend brain MRI with and without contrast for further evaluation and to assess for the possibilty of intracranial metastatic disease. 6. Redemonstrated are multiple sclerotic regions in the cervical and visualized thoracic vertebral bodies, unchanged from prior exam, concerning for metastatic disease. 7. See recent prior CT chest for intrathoracic findings. 8. Emphysema. Emphysema (ICD10-J43.9). Electronically Signed   By: Lorenza Cambridge M.D.   On: 03/15/2023 12:46   CT VENOGRAM HEAD  Result Date: 03/15/2023 CLINICAL DATA:  Stroke/TIA, determine embolic source; Dural venous sinus thrombosis suspected eval for possible metastatic disease, patient too kyphotic for MRI EXAM: CT ANGIOGRAPHY HEAD AND NECK WITH AND WITHOUT CONTRAST CT VENOGRAM HEAD TECHNIQUE: Multidetector CT imaging of the head and neck was performed using the standard protocol during bolus administration of intravenous contrast. Multiplanar CT image reconstructions and MIPs were obtained to evaluate the vascular anatomy. Carotid stenosis measurements (when applicable) are obtained utilizing NASCET criteria, using the distal internal carotid diameter as the denominator. Venographic phase images of the brain were obtained following the administration of intravenous contrast. Multiplanar reformats and maximum intensity projections were generated. RADIATION DOSE REDUCTION: This exam was performed according to the departmental dose-optimization program which includes automated exposure control, adjustment of the mA and/or kV according to patient size and/or use of iterative reconstruction technique. CONTRAST:  75mL OMNIPAQUE IOHEXOL 350 MG/ML SOLN COMPARISON:  CT Head 10/12/22 FINDINGS: CT HEAD FINDINGS Brain: Redemonstrated hypodense lesion lateral right frontal lobe (series 9, image 16), new compared to 02/23/2023. Unchanged  hyperdense lesion in the posterior right temporal lobe with slight interval increase in surrounding  vasogenic edema (series 9, image 30, 33). There may be an additional contrast-enhancing lesion in the posterior left temporal lobe (series 21, image 105) no hydrocephalus. No extra-axial fluid collection. Vascular: No hyperdense vessel or unexpected calcification. See below Skull: Normal. Negative for fracture or focal lesion. Sinuses/Orbits: No middle ear or mastoid effusion. Paranasal sinuses clear. Orbits are unremarkable. Other: None. Review of the MIP images confirms the above findings CTA NECK FINDINGS Aortic arch: Standard branching. Imaged portion shows no evidence of aneurysm or dissection. No significant stenosis of the major arch vessel origins. Right carotid system: No evidence of dissection, stenosis (50% or greater), or occlusion. Left carotid system: No evidence of dissection, stenosis (50% or greater), or occlusion. Vertebral arteries: Codominant. No evidence of dissection, stenosis (50% or greater), or occlusion. Skeleton: Redemonstrated are multiple sclerotic regions in the cervical and visualized thoracic vertebral bodies, unchanged from prior exam. Other neck: Negative. Upper chest: See recent prior CT chest for intrathoracic findings. Moderate centrilobular emphysema. Right-sided central venous catheter is only partially imaged. Review of the MIP images confirms the above findings CTA HEAD FINDINGS Anterior circulation: Moderate to severe narrowing in a proximal left M2 branch (series 16, image 145). Apparent filling defect in the distal left cervical ICA at the skull base is favored to be artifactual (series 16, image 138). Posterior circulation: No significant stenosis, proximal occlusion, aneurysm, or vascular malformation. Venous sinuses: As permitted by contrast timing, patent. Anatomic variants: None Review of the MIP images confirms the above findings CT VENOGRAM FINDINGS: No evidence of  dural venous sinus thrombosis. IMPRESSION: 1. Moderate to severe narrowing in a proximal left M2 branch. 2. No intracranial large vessel occlusion. 3. No hemodynamically significant stenosis in the neck. 4. No evidence of dural venous sinus thrombosis. 5. Redemonstrated hypodense lesion in the lateral right frontal lobe, new compared to 02/23/2023. Redemonstrated hyperdense lesion in the posterior right temporal lobe with slight interval increase in surrounding vasogenic edema. There may be an additional contrast-enhancing lesion in the posterior left temporal lobe. Recommend brain MRI with and without contrast for further evaluation and to assess for the possibilty of intracranial metastatic disease. 6. Redemonstrated are multiple sclerotic regions in the cervical and visualized thoracic vertebral bodies, unchanged from prior exam, concerning for metastatic disease. 7. See recent prior CT chest for intrathoracic findings. 8. Emphysema. Emphysema (ICD10-J43.9). Electronically Signed   By: Lorenza Cambridge M.D.   On: 03/15/2023 12:46   CT Chest Wo Contrast  Result Date: 03/13/2023 CLINICAL DATA:  Unresponsive and possible seizures. EXAM: CT CHEST WITHOUT CONTRAST TECHNIQUE: Multidetector CT imaging of the chest was performed following the standard protocol without IV contrast. RADIATION DOSE REDUCTION: This exam was performed according to the departmental dose-optimization program which includes automated exposure control, adjustment of the mA and/or kV according to patient size and/or use of iterative reconstruction technique. COMPARISON:  Chest radiograph 03/13/2023 and CTA chest 02/23/2023 FINDINGS: Cardiovascular: Trace pericardial effusion. Mild aortic and coronary artery atherosclerotic calcification. Right chest wall Port-A-Cath tip at the superior cavoatrial junction. Mediastinum/Nodes: Small hiatal hernia containing fluid. Esophagus is otherwise unremarkable. Question secretions in the lower trachea though  evaluation is limited by respiratory motion. Possible enlarged left paratracheal lymph node on 2/42 measuring 13 mm. This was not well visualized previously. Similar 9 mm right paratracheal lymph node. Lungs/Pleura: Advanced emphysema. Scarring, architectural distortion, bronchiectasis/bronchiolectasis about the superior left hilum likely due to radiation change. Decreased patchy airspace opacity throughout the left upper lobe since 02/23/2023. Respiratory motion obscures detail however  there is some residual ill-defined ground-glass and nodular opacities. For example along the posterior left lower lobe on 4/68). Similar airspace disease in the right lower lobe. There is likely debris within both mainstem bronchi and multiple lobar and subsegmental bronchi however motion limits assessment. No pleural effusion or pneumothorax. Upper Abdomen: No acute abnormality. Musculoskeletal: No acute fracture. Remote left glenoid fossa fracture. Multiple sclerotic lesions in the spine are similar to prior. Exaggerated thoracic kyphosis. IMPRESSION: 1. Decreased patchy airspace opacity throughout the left upper lobe since 02/23/2023 with some residual ill-defined ground-glass and nodular opacities. Similar airspace disease in the right lower lobe. 2. Likely debris within the trachea and both mainstem bronchi suggesting aspiration. 3. Enlarged left paratracheal lymph node measuring 13 mm. This was not well visualized previously. Continued attention on follow-up. 4. 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 5. Small hiatal hernia containing fluid. Aortic Atherosclerosis (ICD10-I70.0) and Emphysema (ICD10-J43.9). Electronically Signed   By: Minerva Fester M.D.   On: 03/13/2023 23:27   CT HEAD WO CONTRAST ( )  Result Date: 03/13/2023 CLINICAL DATA:  Recent seizure activity with headaches and neck pain, initial encounter EXAM: CT HEAD WITHOUT CONTRAST CT CERVICAL SPINE WITHOUT CONTRAST TECHNIQUE:  Multidetector CT imaging of the head and cervical spine was performed following the standard protocol without intravenous contrast. Multiplanar CT image reconstructions of the cervical spine were also generated. RADIATION DOSE REDUCTION: This exam was performed according to the departmental dose-optimization program which includes automated exposure control, adjustment of the mA and/or kV according to patient size and/or use of iterative reconstruction technique. COMPARISON:  02/23/2019 FINDINGS: CT HEAD FINDINGS Brain: No acute hemorrhage is identified. In the posterior right temporal lobe best seen on axial image number 18 of series 4 there is a 1.1 cm mildly hyperdense lesion identified. No surrounding edema is seen. No other focal mass lesion is noted. Geographic area of decreased attenuation is noted with right superior cerebellum best seen on axial image number 12 of series 4. This is suspicious for subacute ischemia. This was not present on the prior exam. Vascular: No hyperdense vessel or unexpected calcification. Skull: Normal. Negative for fracture or focal lesion. Sinuses/Orbits: No acute finding. Other: None. CT CERVICAL SPINE FINDINGS Alignment: Within normal limits. Skull base and vertebrae: 7 cervical segments are well visualized. Vertebral body height is well maintained. Multilevel osteophytic change and facet hypertrophic changes are noted. No acute fracture or acute facet abnormality is noted. Sclerotic foci are noted in the anterior aspect of the T5 and T6 vertebral body suspicious for bony metastatic disease. Soft tissues and spinal canal: Surrounding soft tissue structures are within normal limits. Right chest wall port is seen. Upper chest: Emphysematous changes are noted. No other focal abnormality is noted. Other: None IMPRESSION: CT of the head: Hyperdense lesion in the posterior aspect of the right temporal lobe better visualized than on prior exam. These changes are suspicious for  underlying cavernoma. MRI of the head may be helpful for further evaluation. Area of decreased attenuation in the superior aspect of the right cerebellum consistent with subacute ischemia. This was not present on the prior CT examination. This could also be evaluated with MRI. CT of the cervical spine: Multilevel degenerative change without acute abnormality. Sclerotic foci consistent with metastatic disease. Electronically Signed   By: Alcide Clever M.D.   On: 03/13/2023 23:21   CT Cervical Spine Wo Contrast  Result Date: 03/13/2023 CLINICAL DATA:  Recent seizure activity with headaches and neck pain, initial  encounter EXAM: CT HEAD WITHOUT CONTRAST CT CERVICAL SPINE WITHOUT CONTRAST TECHNIQUE: Multidetector CT imaging of the head and cervical spine was performed following the standard protocol without intravenous contrast. Multiplanar CT image reconstructions of the cervical spine were also generated. RADIATION DOSE REDUCTION: This exam was performed according to the departmental dose-optimization program which includes automated exposure control, adjustment of the mA and/or kV according to patient size and/or use of iterative reconstruction technique. COMPARISON:  02/23/2019 FINDINGS: CT HEAD FINDINGS Brain: No acute hemorrhage is identified. In the posterior right temporal lobe best seen on axial image number 18 of series 4 there is a 1.1 cm mildly hyperdense lesion identified. No surrounding edema is seen. No other focal mass lesion is noted. Geographic area of decreased attenuation is noted with right superior cerebellum best seen on axial image number 12 of series 4. This is suspicious for subacute ischemia. This was not present on the prior exam. Vascular: No hyperdense vessel or unexpected calcification. Skull: Normal. Negative for fracture or focal lesion. Sinuses/Orbits: No acute finding. Other: None. CT CERVICAL SPINE FINDINGS Alignment: Within normal limits. Skull base and vertebrae: 7 cervical  segments are well visualized. Vertebral body height is well maintained. Multilevel osteophytic change and facet hypertrophic changes are noted. No acute fracture or acute facet abnormality is noted. Sclerotic foci are noted in the anterior aspect of the T5 and T6 vertebral body suspicious for bony metastatic disease. Soft tissues and spinal canal: Surrounding soft tissue structures are within normal limits. Right chest wall port is seen. Upper chest: Emphysematous changes are noted. No other focal abnormality is noted. Other: None IMPRESSION: CT of the head: Hyperdense lesion in the posterior aspect of the right temporal lobe better visualized than on prior exam. These changes are suspicious for underlying cavernoma. MRI of the head may be helpful for further evaluation. Area of decreased attenuation in the superior aspect of the right cerebellum consistent with subacute ischemia. This was not present on the prior CT examination. This could also be evaluated with MRI. CT of the cervical spine: Multilevel degenerative change without acute abnormality. Sclerotic foci consistent with metastatic disease. Electronically Signed   By: Alcide Clever M.D.   On: 03/13/2023 23:21   DG Chest Portable 1 View  Result Date: 03/13/2023 CLINICAL DATA:  Shortness of breath EXAM: PORTABLE CHEST 1 VIEW COMPARISON:  02/23/2023, PET CT 11/04/2022 FINDINGS: Right-sided central venous port tip at the proximal right atrium. Borderline cardiomegaly. No acute airspace disease or pleural effusion. Mild diffuse bronchitic changes. No pneumothorax IMPRESSION: Mild diffuse bronchitic changes. Electronically Signed   By: Jasmine Pang M.D.   On: 03/13/2023 22:29   EEG adult  Result Date: 02/24/2023 Jefferson Fuel, MD     02/24/2023  3:47 PM Routine EEG Report Dysen Heintzelman is a 62 y.o. male with a history of seizure who is undergoing an EEG to evaluate for seizures. Report: This EEG was acquired with electrodes placed according to the  International 10-20 electrode system (including Fp1, Fp2, F3, F4, C3, C4, P3, P4, O1, O2, T3, T4, T5, T6, A1, A2, Fz, Cz, Pz). The following electrodes were missing or displaced: none. The occipital dominant rhythm was 7-8 Hz with intermittent bifrontal slowing. This activity is reactive to stimulation. Drowsiness was manifested by background fragmentation; deeper stages of sleep were identified by K complexes and sleep spindles. There were no interictal epileptiform discharges. There were no electrographic seizures identified. There was no abnormal response to photic stimulation or hyperventilation. Impression and clinical  correlation: This EEG was obtained while awake and asleep and is abnormal due to mild diffuse slowing and intermittent superimposed bifrontal slowing indicative of global cerebral dysfunction. Epileptiform abnormalities were not seen during this recording. Bing Neighbors, MD [facility] [phone] If 7pm- 7am, please page neurology on call as listed in AMION.    Microbiology: Results for orders placed or performed during the hospital encounter of 03/13/23  Blood culture (routine x 2)     Status: None   Collection Time: 03/13/23 10:01 PM   Specimen: BLOOD RIGHT ARM  Result Value Ref Range Status   Specimen Description BLOOD RIGHT ARM  Final   Special Requests   Final    BOTTLES DRAWN AEROBIC AND ANAEROBIC Blood Culture adequate volume   Culture   Final    NO GROWTH 5 DAYS Performed at [facility], [address] [place], [place] [address]    Report Status 03/18/2023 FINAL  Final  Blood culture (routine x 2)     Status: Abnormal   Collection Time: 03/13/23 10:01 PM   Specimen: BLOOD LEFT ARM  Result Value Ref Range Status   Specimen Description   Final    BLOOD LEFT ARM Performed at [facility], [address], [place], [place] [address]    Special Requests   Final    BOTTLES DRAWN AEROBIC AND ANAEROBIC Blood Culture adequate  volume Performed at [facility], [address] [place], [place] [address]    Culture  Setup Time   Final    GRAM POSITIVE RODS AEROBIC BOTTLE ONLY CRITICAL RESULT CALLED TO, READ BACK BY AND VERIFIED WITH: MADELYN MITCHELL AT 1308 03/15/23.PMF GRAM STAIN REVIEWED-AGREE WITH RESULT    Culture (A)  Final    CORYNEBACTERIUM AMYCOLATUM Standardized susceptibility testing for this organism is not available. Performed at [facility], [address], [place], [place] [address]    Report Status 03/17/2023 FINAL  Final  SARS Coronavirus 2 by RT PCR (hospital order, performed in [facility] hospital lab) *cepheid single result test* Anterior Nasal Swab     Status: None   Collection Time: 03/13/23 11:31 PM   Specimen: Anterior Nasal Swab  Result Value Ref Range Status   SARS Coronavirus 2 by RT PCR NEGATIVE NEGATIVE Final    Comment: (NOTE) SARS-CoV-2 target nucleic acids are NOT DETECTED.  The SARS-CoV-2 RNA is generally detectable in upper and lower respiratory specimens during the acute phase of infection. The lowest concentration of SARS-CoV-2 viral copies this assay can detect is 250 copies / mL. A negative result does not preclude SARS-CoV-2 infection and should not be used as the sole basis for treatment or other patient management decisions.  A negative result may occur with improper specimen collection / handling, submission of specimen other than nasopharyngeal swab, presence of viral mutation(s) within the areas targeted by this assay, and inadequate number of viral copies (<250 copies / mL). A negative result must be combined with clinical observations, patient history, and epidemiological information.  Fact Sheet for Patients:   RoadLapTop.co.za  Fact Sheet for Healthcare Providers: http://kim-miller.com/  This test is not yet approved or  cleared by the [place] FDA and has been authorized for detection  and/or diagnosis of SARS-CoV-2 by FDA under an Emergency Use Authorization (EUA).  This EUA will remain in effect (meaning this test can be used) for the duration of the COVID-19 declaration under Section 564(b)(1) of the Act, 21 U.S.C. section 360bbb-3(b)(1), unless the authorization is terminated  or revoked sooner.  Performed at [facility], [address] [place], [place] [address]    Labs: CBC: Recent Labs  Lab 03/19/23 0404 03/20/23 1150  WBC 6.6 8.2  NEUTROABS  --  6.5  HGB 13.1 13.2  HCT 38.8* 39.8  MCV 86.2 88.2  PLT 279 300   Basic Metabolic Panel: Recent Labs  Lab 03/19/23 0404 03/19/23 1734 03/20/23 1150 03/22/23 0859  NA 132* 130* 130* 132*  K 3.6 3.7 3.6 3.6  CL 95* 94* 95* 95*  CO2 23 23 24 27   GLUCOSE 97 108* 159* 124*  BUN 13 9 17 7   CREATININE 0.73 0.66 1.11 0.56*  CALCIUM 9.3 9.2 9.4 8.9  MG 1.7  --  1.7  --    Liver Function Tests: Recent Labs  Lab 03/19/23 1734 03/20/23 1150  AST 25 24  ALT 20 19  ALKPHOS 77 71  BILITOT 1.1 1.0  PROT 6.8 6.6  ALBUMIN 3.9 3.9   CBG: Recent Labs  Lab 03/19/23 1551 03/23/23 2149  GLUCAP 112* 109*    Discharge time spent: greater than 30 minutes.  Author: Lynden Oxford, MD  [facility] 03/24/2023

## 2023-03-26 ENCOUNTER — Ambulatory Visit

## 2023-03-26 ENCOUNTER — Inpatient Hospital Stay: Payer: Medicare Other

## 2023-03-26 ENCOUNTER — Other Ambulatory Visit: Payer: Self-pay | Admitting: Oncology

## 2023-03-26 ENCOUNTER — Other Ambulatory Visit

## 2023-03-26 ENCOUNTER — Ambulatory Visit: Admitting: Oncology

## 2023-03-26 ENCOUNTER — Inpatient Hospital Stay: Payer: Medicare Other | Admitting: Oncology

## 2023-04-16 ENCOUNTER — Ambulatory Visit: Admitting: Oncology

## 2023-04-16 ENCOUNTER — Ambulatory Visit

## 2023-04-16 ENCOUNTER — Other Ambulatory Visit

## 2023-04-18 ENCOUNTER — Emergency Department
Admission: EM | Admit: 2023-04-18 | Discharge: 2023-04-18 | Disposition: A | Discharge status: Home / Self Care | Attending: Emergency Medicine | Admitting: Emergency Medicine

## 2023-04-18 ENCOUNTER — Encounter: Payer: Self-pay | Admitting: Emergency Medicine

## 2023-04-18 ENCOUNTER — Emergency Department

## 2023-04-18 ENCOUNTER — Other Ambulatory Visit: Payer: Self-pay

## 2023-04-18 DIAGNOSIS — M25552 Pain in left hip: Secondary | ICD-10-CM | POA: Insufficient documentation

## 2023-04-18 DIAGNOSIS — M549 Dorsalgia, unspecified: Secondary | ICD-10-CM | POA: Insufficient documentation

## 2023-04-18 DIAGNOSIS — W182XXA Fall in (into) shower or empty bathtub, initial encounter: Secondary | ICD-10-CM | POA: Diagnosis not present

## 2023-04-18 DIAGNOSIS — Z85118 Personal history of other malignant neoplasm of bronchus and lung: Secondary | ICD-10-CM | POA: Diagnosis not present

## 2023-04-18 DIAGNOSIS — W19XXXA Unspecified fall, initial encounter: Secondary | ICD-10-CM

## 2023-04-18 DIAGNOSIS — Z85841 Personal history of malignant neoplasm of brain: Secondary | ICD-10-CM | POA: Insufficient documentation

## 2023-04-18 MED ORDER — KETOROLAC TROMETHAMINE 30 MG/ML IJ SOLN
30.0000 mg | Freq: Once | INTRAMUSCULAR | Status: AC
  Administered 2023-04-18: 30 mg via INTRAMUSCULAR
  Filled 2023-04-18: qty 1

## 2023-04-18 NOTE — ED Provider Notes (Signed)
[facility] Provider Note    Event Date/Time   First MD Initiated Contact with Patient 04/18/23 1058     (approximate)   History   Fall   HPI  Trevor Montgomery is a 62 y.o. male patient with schizoaffective disorder, metastatic lung cancer to the brain who is on hospice care who presents for fall.  Patient reports he lost his balance getting out of the shower.  He fell onto his left hip and complains of mild left back pain as well.  Is able to ambulate.  No other injuries reported.  No head injury.  No seizure activity     Physical Exam   Triage Vital Signs: ED Triage Vitals  Encounter Vitals Group     BP 04/18/23 1053 (!) 176/109     Systolic BP Percentile --      Diastolic BP Percentile --      Pulse Rate 04/18/23 1053 80     Resp 04/18/23 1053 18     Temp 04/18/23 1053 98.2 F (36.8 C)     Temp Source 04/18/23 1053 Oral     SpO2 04/18/23 1053 93 %     Weight 04/18/23 1057 86.2 kg (190 lb)     Height 04/18/23 1057 1.753 m (5\' 9" )     Head Circumference --      Peak Flow --      Pain Score 04/18/23 1055 7     Pain Loc --      Pain Education --      Exclude from Growth Chart --     Most recent vital signs: Vitals:   04/18/23 1053  BP: (!) 176/109  Pulse: 80  Resp: 18  Temp: 98.2 F (36.8 C)  SpO2: 93%     General: Awake, no distress.  CV:  Good peripheral perfusion.  Resp:  Normal effort.  Abd:  No distention.  Other:  Mild tenderness along the lumbar spine, no clear bruising or swelling.  Normal range of motion of both legs with no pain with axial load on the hips bilaterally   ED Results / Procedures / Treatments   Labs (all labs ordered are listed, but only abnormal results are displayed) Labs Reviewed - No data to display   EKG     RADIOLOGY Pelvis x-ray viewed interpret by me, no fracture    PROCEDURES:  Critical Care performed:   Procedures   MEDICATIONS ORDERED IN ED: Medications  ketorolac (TORADOL) 30  MG/ML injection 30 mg (30 mg Intramuscular Given 04/18/23 1158)     IMPRESSION / MDM / ASSESSMENT AND PLAN / ED COURSE  I reviewed the triage vital signs and the nursing notes. Patient's presentation is most consistent with acute complicated illness / injury requiring diagnostic workup.   Patient presents after a fall with metastatic lung cancer.  This appears to be mechanical fall.  Hip exam is reassuring, will x-ray of the pelvis and low back to rule out fracture.  X-rays are negative for fracture, patient treated with IM Toradol  Appropriate for discharge at this time, consult to [facility] for home assistance placed     FINAL CLINICAL IMPRESSION(S) / ED DIAGNOSES   Final diagnoses:  Fall, initial encounter  Left hip pain     Rx / DC Orders   ED Discharge Orders     None        Note:  This document was prepared using Dragon voice recognition software and may include unintentional dictation  errors.   Jene Every, MD 04/18/23 1259

## 2023-04-18 NOTE — ED Triage Notes (Signed)
Per EMS report, patient fell while getting out of shower. Patient c/o left hip pain. Patient was able to stand, but not walk. Patient is being treated for brain/bone cancer. Per EMT, no rotation or shortening was noted.  182/108 87 pulse 95% on room air

## 2023-05-03 ENCOUNTER — Emergency Department

## 2023-05-03 ENCOUNTER — Emergency Department
Admission: EM | Admit: 2023-05-03 | Discharge: 2023-05-04 | Disposition: A | Discharge status: Home / Self Care | Attending: Emergency Medicine | Admitting: Emergency Medicine

## 2023-05-03 DIAGNOSIS — Z20822 Contact with and (suspected) exposure to covid-19: Secondary | ICD-10-CM | POA: Diagnosis not present

## 2023-05-03 DIAGNOSIS — R059 Cough, unspecified: Secondary | ICD-10-CM | POA: Diagnosis present

## 2023-05-03 DIAGNOSIS — Z85118 Personal history of other malignant neoplasm of bronchus and lung: Secondary | ICD-10-CM | POA: Insufficient documentation

## 2023-05-03 DIAGNOSIS — T17908A Unspecified foreign body in respiratory tract, part unspecified causing other injury, initial encounter: Secondary | ICD-10-CM | POA: Diagnosis not present

## 2023-05-03 DIAGNOSIS — X58XXXA Exposure to other specified factors, initial encounter: Secondary | ICD-10-CM | POA: Diagnosis not present

## 2023-05-03 LAB — CBC WITH DIFFERENTIAL/PLATELET
Abs Immature Granulocytes: 0.04 10*3/uL (ref 0.00–0.07)
Basophils Absolute: 0.1 10*3/uL (ref 0.0–0.1)
Basophils Relative: 1 %
Eosinophils Absolute: 0.3 10*3/uL (ref 0.0–0.5)
Eosinophils Relative: 4 %
HCT: 36.6 % — ABNORMAL LOW (ref 39.0–52.0)
Hemoglobin: 12.2 g/dL — ABNORMAL LOW (ref 13.0–17.0)
Immature Granulocytes: 1 %
Lymphocytes Relative: 7 %
Lymphs Abs: 0.6 10*3/uL — ABNORMAL LOW (ref 0.7–4.0)
MCH: 27.9 pg (ref 26.0–34.0)
MCHC: 33.3 g/dL (ref 30.0–36.0)
MCV: 83.8 fL (ref 80.0–100.0)
Monocytes Absolute: 1 10*3/uL (ref 0.1–1.0)
Monocytes Relative: 12 %
Neutro Abs: 6.3 10*3/uL (ref 1.7–7.7)
Neutrophils Relative %: 75 %
Platelets: 316 10*3/uL (ref 150–400)
RBC: 4.37 MIL/uL (ref 4.22–5.81)
RDW: 12.7 % (ref 11.5–15.5)
WBC: 8.3 10*3/uL (ref 4.0–10.5)
nRBC: 0 % (ref 0.0–0.2)

## 2023-05-03 LAB — BASIC METABOLIC PANEL
Anion gap: 10 (ref 5–15)
BUN: 9 mg/dL (ref 6–20)
CO2: 28 mmol/L (ref 22–32)
Calcium: 9.3 mg/dL (ref 8.9–10.3)
Chloride: 92 mmol/L — ABNORMAL LOW (ref 98–111)
Creatinine, Ser: 0.56 mg/dL — ABNORMAL LOW (ref 0.61–1.24)
GFR, Estimated: >60 mL/min (ref >60)
Glucose, Bld: 129 mg/dL — ABNORMAL HIGH (ref 70–99)
Potassium: 3.7 mmol/L (ref 3.5–5.1)
Sodium: 130 mmol/L — ABNORMAL LOW (ref 135–145)

## 2023-05-03 LAB — SARS CORONAVIRUS 2 BY RT PCR: SARS Coronavirus 2 by RT PCR: NEGATIVE

## 2023-05-03 NOTE — ED Provider Notes (Signed)
[facility] Provider Note    Event Date/Time   First MD Initiated Contact with Patient 05/03/23 1901     (approximate)   History   Cough   HPI  Ossie Sebo is a 62 y.o. male who presents to the emergency department today because of need for oropharyngeal suctioning.  History is obtained from family at bedside.  Patient is hospice patient secondary to metastatic small cell lung cancer.  Family states that he has had a drastic decline over the past 5 days.  Today he was having a hard time protecting his airway.  Family states that he cannot really cough up any mucus or saliva.  Apparently they were planning on going to hospice house however hospice house was not ready to take him so requested he be transported to the emergency department.  Family states he is comfort care at this point and they only need help with suctioning until hospice home can take him.  Physical Exam   Triage Vital Signs: ED Triage Vitals  Encounter Vitals Group     BP 05/03/23 1900 (!) 166/70     Systolic BP Percentile --      Diastolic BP Percentile --      Pulse Rate 05/03/23 1900 (!) 116     Resp 05/03/23 1900 (!) 26     Temp 05/03/23 1900 98.2 F (36.8 C)     Temp Source 05/03/23 1900 Axillary     SpO2 05/03/23 1859 91 %     Weight --      Height --      Head Circumference --      Peak Flow --      Pain Score 05/03/23 1905 0     Pain Loc --      Pain Education --      Exclude from Growth Chart --     Most recent vital signs: Vitals:   05/03/23 1859 05/03/23 1900  BP:  (!) 166/70  Pulse:  (!) 116  Resp:  (!) 26  Temp:  98.2 F (36.8 C)  SpO2: 91% 90%   General: Awake, alert. CV:  Tachycardia. Resp:  Occasional cough. Abd:  No distention.     ED Results / Procedures / Treatments   Labs (all labs ordered are listed, but only abnormal results are displayed) Labs Reviewed  SARS CORONAVIRUS 2 BY RT PCR  CBC WITH DIFFERENTIAL/PLATELET  BASIC METABOLIC PANEL      EKG  None   RADIOLOGY I independently interpreted and visualized the CXR. My interpretation: No pneumonia Radiology interpretation:  IMPRESSION:  Chronic appearing increased interstitial lung markings without  evidence of acute or active cardiopulmonary disease.      PROCEDURES:  Critical Care performed: No    MEDICATIONS ORDERED IN ED: Medications - No data to display   IMPRESSION / MDM / ASSESSMENT AND PLAN / ED COURSE  I reviewed the triage vital signs and the nursing notes.                              Patient presented to the emergency department today for help with oropharyngeal suction until he can go to the hospice home.  Will have nursing staff suction until hospice home can take him.   FINAL CLINICAL IMPRESSION(S) / ED DIAGNOSES   Final diagnoses:  Aspiration into airway, initial encounter   Note:  This document was prepared using Dragon voice recognition software  and may include unintentional dictation errors.    Phineas Semen, MD 05/03/23 2022

## 2023-05-03 NOTE — ED Notes (Signed)
Called [facility] for transport to the Hospice Home

## 2023-05-03 NOTE — ED Triage Notes (Signed)
Pt to ED via [facility] from home for c/o productive cough. Hospice pt with hx lung cancer. Pt uses 2L O2. Pt suctioned by EMS and placed on 6L.

## 2023-05-04 NOTE — ED Notes (Signed)
Patient and family refused vitals. Patient unable to be still.

## 2023-05-25 DEATH — deceased

## 2023-06-26 ENCOUNTER — Other Ambulatory Visit: Payer: Self-pay | Admitting: Physician Assistant
